# Patient Record
Sex: Female | Born: 1960 | Race: White | Hispanic: No | Smoking: Current every day smoker
Health system: Southern US, Community
[De-identification: ages and names within clinical notes are randomized; demographics above are authoritative.]

## PROBLEM LIST (undated history)

## (undated) DIAGNOSIS — K219 Gastro-esophageal reflux disease without esophagitis: Secondary | ICD-10-CM

## (undated) DIAGNOSIS — T7840XA Allergy, unspecified, initial encounter: Secondary | ICD-10-CM

## (undated) DIAGNOSIS — F419 Anxiety disorder, unspecified: Secondary | ICD-10-CM

## (undated) DIAGNOSIS — A498 Other bacterial infections of unspecified site: Secondary | ICD-10-CM

## (undated) DIAGNOSIS — G629 Polyneuropathy, unspecified: Secondary | ICD-10-CM

## (undated) DIAGNOSIS — B1920 Unspecified viral hepatitis C without hepatic coma: Secondary | ICD-10-CM

## (undated) DIAGNOSIS — F1910 Other psychoactive substance abuse, uncomplicated: Secondary | ICD-10-CM

## (undated) DIAGNOSIS — B009 Herpesviral infection, unspecified: Secondary | ICD-10-CM

## (undated) DIAGNOSIS — J439 Emphysema, unspecified: Secondary | ICD-10-CM

## (undated) DIAGNOSIS — Z9889 Other specified postprocedural states: Secondary | ICD-10-CM

## (undated) DIAGNOSIS — J449 Chronic obstructive pulmonary disease, unspecified: Secondary | ICD-10-CM

## (undated) DIAGNOSIS — L509 Urticaria, unspecified: Secondary | ICD-10-CM

## (undated) DIAGNOSIS — F99 Mental disorder, not otherwise specified: Secondary | ICD-10-CM

## (undated) DIAGNOSIS — R0902 Hypoxemia: Secondary | ICD-10-CM

## (undated) DIAGNOSIS — F1020 Alcohol dependence, uncomplicated: Secondary | ICD-10-CM

## (undated) DIAGNOSIS — T4145XA Adverse effect of unspecified anesthetic, initial encounter: Secondary | ICD-10-CM

## (undated) DIAGNOSIS — M1990 Unspecified osteoarthritis, unspecified site: Secondary | ICD-10-CM

## (undated) DIAGNOSIS — M810 Age-related osteoporosis without current pathological fracture: Secondary | ICD-10-CM

## (undated) DIAGNOSIS — F329 Major depressive disorder, single episode, unspecified: Secondary | ICD-10-CM

## (undated) DIAGNOSIS — G4700 Insomnia, unspecified: Secondary | ICD-10-CM

## (undated) DIAGNOSIS — F32A Depression, unspecified: Secondary | ICD-10-CM

## (undated) DIAGNOSIS — G4730 Sleep apnea, unspecified: Secondary | ICD-10-CM

## (undated) DIAGNOSIS — T8859XA Other complications of anesthesia, initial encounter: Secondary | ICD-10-CM

## (undated) DIAGNOSIS — T783XXA Angioneurotic edema, initial encounter: Secondary | ICD-10-CM

## (undated) DIAGNOSIS — M797 Fibromyalgia: Secondary | ICD-10-CM

## (undated) DIAGNOSIS — I10 Essential (primary) hypertension: Secondary | ICD-10-CM

## (undated) DIAGNOSIS — E785 Hyperlipidemia, unspecified: Secondary | ICD-10-CM

## (undated) DIAGNOSIS — R112 Nausea with vomiting, unspecified: Secondary | ICD-10-CM

## (undated) HISTORY — PX: COLONOSCOPY: SHX174

## (undated) HISTORY — DX: Essential (primary) hypertension: I10

## (undated) HISTORY — PX: TEMPOROMANDIBULAR JOINT SURGERY: SHX35

## (undated) HISTORY — DX: Angioneurotic edema, initial encounter: T78.3XXA

## (undated) HISTORY — PX: AUGMENTATION MAMMAPLASTY: SUR837

## (undated) HISTORY — PX: EXPLORATORY LAPAROTOMY: SUR591

## (undated) HISTORY — DX: Herpesviral infection, unspecified: B00.9

## (undated) HISTORY — DX: Hypoxemia: R09.02

## (undated) HISTORY — DX: Emphysema, unspecified: J43.9

## (undated) HISTORY — PX: TUBAL LIGATION: SHX77

## (undated) HISTORY — DX: Polyneuropathy, unspecified: G62.9

## (undated) HISTORY — DX: Urticaria, unspecified: L50.9

## (undated) HISTORY — DX: Age-related osteoporosis without current pathological fracture: M81.0

## (undated) HISTORY — PX: UPPER GI ENDOSCOPY: SHX6162

## (undated) HISTORY — DX: Alcohol dependence, uncomplicated: F10.20

## (undated) HISTORY — PX: BREAST REDUCTION SURGERY: SHX8

## (undated) HISTORY — DX: Other psychoactive substance abuse, uncomplicated: F19.10

## (undated) HISTORY — DX: Insomnia, unspecified: G47.00

## (undated) HISTORY — PX: BREAST ENHANCEMENT SURGERY: SHX7

## (undated) HISTORY — DX: Allergy, unspecified, initial encounter: T78.40XA

## (undated) HISTORY — DX: Unspecified osteoarthritis, unspecified site: M19.90

## (undated) HISTORY — DX: Sleep apnea, unspecified: G47.30

---

## 2000-01-23 ENCOUNTER — Emergency Department (HOSPITAL_COMMUNITY): Admission: EM | Admit: 2000-01-23 | Discharge: 2000-01-23 | Payer: Self-pay | Admitting: Emergency Medicine

## 2000-11-03 ENCOUNTER — Other Ambulatory Visit: Admission: RE | Admit: 2000-11-03 | Discharge: 2000-11-03 | Payer: Self-pay | Admitting: Obstetrics and Gynecology

## 2001-08-19 ENCOUNTER — Encounter: Payer: Self-pay | Admitting: Gastroenterology

## 2001-08-19 ENCOUNTER — Ambulatory Visit (HOSPITAL_COMMUNITY): Admission: RE | Admit: 2001-08-19 | Discharge: 2001-08-19 | Payer: Self-pay | Admitting: Gastroenterology

## 2001-08-26 ENCOUNTER — Encounter: Payer: Self-pay | Admitting: Gastroenterology

## 2001-08-26 ENCOUNTER — Ambulatory Visit (HOSPITAL_COMMUNITY): Admission: RE | Admit: 2001-08-26 | Discharge: 2001-08-26 | Payer: Self-pay | Admitting: Gastroenterology

## 2001-09-07 ENCOUNTER — Ambulatory Visit (HOSPITAL_COMMUNITY): Admission: RE | Admit: 2001-09-07 | Discharge: 2001-09-07 | Payer: Self-pay | Admitting: Gastroenterology

## 2001-11-05 ENCOUNTER — Other Ambulatory Visit: Admission: RE | Admit: 2001-11-05 | Discharge: 2001-11-05 | Payer: Self-pay | Admitting: Obstetrics and Gynecology

## 2002-01-12 ENCOUNTER — Ambulatory Visit (HOSPITAL_COMMUNITY): Admission: RE | Admit: 2002-01-12 | Discharge: 2002-01-12 | Payer: Self-pay | Admitting: Gastroenterology

## 2002-01-12 ENCOUNTER — Encounter (INDEPENDENT_AMBULATORY_CARE_PROVIDER_SITE_OTHER): Payer: Self-pay | Admitting: Specialist

## 2002-01-19 ENCOUNTER — Encounter: Payer: Self-pay | Admitting: Internal Medicine

## 2002-01-25 ENCOUNTER — Inpatient Hospital Stay (HOSPITAL_COMMUNITY): Admission: RE | Admit: 2002-01-25 | Discharge: 2002-01-26 | Payer: Self-pay | Admitting: *Deleted

## 2002-01-25 ENCOUNTER — Encounter: Payer: Self-pay | Admitting: Internal Medicine

## 2002-12-06 ENCOUNTER — Other Ambulatory Visit: Admission: RE | Admit: 2002-12-06 | Discharge: 2002-12-06 | Payer: Self-pay | Admitting: Obstetrics and Gynecology

## 2003-11-15 ENCOUNTER — Inpatient Hospital Stay (HOSPITAL_COMMUNITY): Admission: EM | Admit: 2003-11-15 | Discharge: 2003-11-22 | Payer: Self-pay | Admitting: Psychiatry

## 2003-11-18 ENCOUNTER — Encounter (HOSPITAL_COMMUNITY): Payer: Self-pay | Admitting: Psychiatry

## 2003-12-29 ENCOUNTER — Other Ambulatory Visit: Admission: RE | Admit: 2003-12-29 | Discharge: 2003-12-29 | Payer: Self-pay | Admitting: Obstetrics and Gynecology

## 2004-08-24 ENCOUNTER — Ambulatory Visit: Payer: Self-pay | Admitting: Family Medicine

## 2004-09-07 ENCOUNTER — Ambulatory Visit: Payer: Self-pay | Admitting: Family Medicine

## 2004-10-12 ENCOUNTER — Ambulatory Visit: Payer: Self-pay | Admitting: Family Medicine

## 2004-12-27 ENCOUNTER — Other Ambulatory Visit: Admission: RE | Admit: 2004-12-27 | Discharge: 2004-12-27 | Payer: Self-pay | Admitting: Obstetrics and Gynecology

## 2005-01-16 ENCOUNTER — Ambulatory Visit: Payer: Self-pay | Admitting: Family Medicine

## 2005-01-24 ENCOUNTER — Encounter (HOSPITAL_COMMUNITY): Admission: RE | Admit: 2005-01-24 | Discharge: 2005-04-24 | Payer: Self-pay | Admitting: Endocrinology

## 2005-02-05 ENCOUNTER — Ambulatory Visit: Payer: Self-pay | Admitting: Family Medicine

## 2005-11-18 ENCOUNTER — Ambulatory Visit: Payer: Self-pay | Admitting: Family Medicine

## 2006-01-28 ENCOUNTER — Encounter: Admission: RE | Admit: 2006-01-28 | Discharge: 2006-01-28 | Payer: Self-pay | Admitting: Obstetrics and Gynecology

## 2006-02-06 ENCOUNTER — Ambulatory Visit: Payer: Self-pay | Admitting: Family Medicine

## 2006-02-12 ENCOUNTER — Other Ambulatory Visit: Admission: RE | Admit: 2006-02-12 | Discharge: 2006-02-12 | Payer: Self-pay | Admitting: Obstetrics and Gynecology

## 2006-05-15 ENCOUNTER — Ambulatory Visit: Payer: Self-pay | Admitting: Internal Medicine

## 2006-07-16 ENCOUNTER — Ambulatory Visit: Payer: Self-pay | Admitting: Internal Medicine

## 2006-07-18 ENCOUNTER — Ambulatory Visit: Payer: Self-pay | Admitting: Cardiology

## 2006-07-22 ENCOUNTER — Ambulatory Visit: Payer: Self-pay | Admitting: *Deleted

## 2007-06-30 DIAGNOSIS — K219 Gastro-esophageal reflux disease without esophagitis: Secondary | ICD-10-CM | POA: Insufficient documentation

## 2007-06-30 DIAGNOSIS — B182 Chronic viral hepatitis C: Secondary | ICD-10-CM | POA: Insufficient documentation

## 2007-06-30 DIAGNOSIS — B1710 Acute hepatitis C without hepatic coma: Secondary | ICD-10-CM | POA: Insufficient documentation

## 2007-06-30 DIAGNOSIS — J309 Allergic rhinitis, unspecified: Secondary | ICD-10-CM | POA: Insufficient documentation

## 2007-06-30 DIAGNOSIS — J45909 Unspecified asthma, uncomplicated: Secondary | ICD-10-CM | POA: Insufficient documentation

## 2007-11-12 ENCOUNTER — Emergency Department (HOSPITAL_COMMUNITY): Admission: EM | Admit: 2007-11-12 | Discharge: 2007-11-12 | Payer: Self-pay | Admitting: Emergency Medicine

## 2007-12-04 ENCOUNTER — Encounter: Admission: RE | Admit: 2007-12-04 | Discharge: 2007-12-04 | Payer: Self-pay | Admitting: Internal Medicine

## 2009-04-28 ENCOUNTER — Encounter: Admission: RE | Admit: 2009-04-28 | Discharge: 2009-04-28 | Payer: Self-pay | Admitting: Obstetrics and Gynecology

## 2009-10-18 ENCOUNTER — Encounter: Admission: RE | Admit: 2009-10-18 | Discharge: 2009-10-18 | Payer: Self-pay | Admitting: Gastroenterology

## 2010-09-28 ENCOUNTER — Encounter
Admission: RE | Admit: 2010-09-28 | Discharge: 2010-09-28 | Payer: Self-pay | Source: Home / Self Care | Attending: Gastroenterology | Admitting: Gastroenterology

## 2010-11-11 ENCOUNTER — Encounter: Payer: Self-pay | Admitting: Internal Medicine

## 2010-12-03 ENCOUNTER — Other Ambulatory Visit: Payer: Self-pay | Admitting: Internal Medicine

## 2010-12-03 ENCOUNTER — Ambulatory Visit
Admission: RE | Admit: 2010-12-03 | Discharge: 2010-12-03 | Disposition: A | Discharge status: Home / Self Care | Payer: BC Managed Care – PPO | Source: Ambulatory Visit | Attending: Internal Medicine | Admitting: Internal Medicine

## 2010-12-03 DIAGNOSIS — R05 Cough: Secondary | ICD-10-CM

## 2010-12-03 DIAGNOSIS — F17200 Nicotine dependence, unspecified, uncomplicated: Secondary | ICD-10-CM

## 2010-12-03 DIAGNOSIS — R059 Cough, unspecified: Secondary | ICD-10-CM

## 2011-03-08 NOTE — Assessment & Plan Note (Signed)
[facility]                               PULMONARY OFFICE NOTE   CALINA, PATRIE                      MRN:          [id number]  DATE:05/15/2006                            DOB:          01-Jul-1961    This is a pulmonary/new patient evaluation.   CHIEF COMPLAINT:  Cough.   HISTORY:  50 year old white female who states she has been coughing since  childhood, actively smoking a pack per day since teenage years, but having  mostly intermittent cough until August of 2006, at which time she has  developed more of a chronic cough, consisting of a congested rattling  sensation that is worse in the winter time, worse in the mornings, and  occurs especially immediately when she lies down at night.  She has minimal  mucoid sputum production, and generalized chest discomfort during coughing  paroxysms.  She has only transient improvement on antibiotics and  prednisone.  She comes in discouraged that she cannot seem to get ahead of  the cough.   PAST MEDICAL HISTORY:  Significant for chronic sinus problems, headaches  and previous jaw surgery, as well as breast reduction surgery.   ALLERGIES:  None known.   MEDICATIONS:  1.  Deplin 1 daily.  2.  Zoloft 100 mg 1/2 daily.  3.  Nadolol 20 mg 1 q.a.m. long-term.  4.  Zyrtec 10 mg 1 daily.  5.  Lamictal 100 mg 1-1/2 daily.   SOCIAL HISTORY:  She continues to smoke a pack per day.  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   FAMILY HISTORY:  Reviewed in detail, significant for allergies and asthma in  her mother, who also was diagnosed with emphysema, is a smoker.   REVIEW OF SYSTEMS:  Taken in detail also on the worksheet.  Significant for  the problems as outlined above.   PHYSICAL EXAMINATION:  GENERAL:  This is a pleasant, middle-aged white  female in no acute distress.  She has stable vital signs.  She does have  somewhat of a gruff quality voice.  HEENT:   Reveals moderate turbinate edema with nonspecific features.  Oropharynx is clear.  No evidence of excessive postnasal drainage or  cobblestoning.  Dentition is intact.  NECK:  Supple without cervical adenopathy or tenderness.  Trachea is midline  without thyromegaly.  CHEST:  Lung fields reveal panexpiratory rhonchi bilaterally, with end  expiratory wheeze/cough.  HEART:  Reveals regular rhythm without murmur, gallop or rub present.  ABDOMEN:  Soft, benign, with no organomegaly, mass or tenderness.  EXTREMITIES:  Warm without calf tenderness, cyanosis, clubbing or edema.   Chest x-ray reveals mild COPD changes with increased bronchial markings.   IMPRESSION:  Chronic asthmatic bronchitis/chronic obstructive pulmonary  disease, with acquired mucociliary dysfunction suggested by increased  morning sputum production, but also perhaps a component of postnasal drip  syndrome/chronic rhinitis/sinusitis based on the fact that she immediately  coughs on assuming the supine position.   She tells me that she is not consistent about using nadolol, and nadolol is  a nonspecific  beta blocker that may aggravate asthmatic bronchitis.  I  therefore recommended the following in writing:  1.  Stop nadolol.  2.  Start Mucinex DM 2 b.i.d., if still coughing add tramadol 50 mg 1 q.4h.  3.  Start Nexium 40 mg 1 q.a.m. 30 minutes before breakfast, because such      severe chronic coughing can lead to reflux.  4.  Finish a course of doxycycline that she started (even though I note that      she does not have significant purulent sputum at this point).  5.  Start Symbicort 80/4.5 2 puffs b.i.d. perfectly regular, with extra time      teaching her optimal technique today.  6.  Followup planned for 6 weeks with PFTs.   If she does not improve on this regimen, I have recommended a sinus CT scan  prior to next visit.                                   Charlaine Dalton. Sherene Sires, MD, [facility]   MBW/MedQ  DD:  05/16/2006   DT:  05/17/2006  Job #:  [id number]

## 2011-03-08 NOTE — Discharge Summary (Signed)
NAME:  Sarah Reeves, Sarah Reeves                         ACCOUNT NO.:  [id number]   MEDICAL RECORD NO.:  [id number]                   PATIENT TYPE:  IPS   LOCATION:  0304                                 FACILITY:  [facility]   PHYSICIAN:  Jeanice Lim, M.D.              DATE OF BIRTH:  05-Jun-1961   DATE OF ADMISSION:  11/15/2003  DATE OF DISCHARGE:  11/22/2003                                 DISCHARGE SUMMARY   IDENTIFYING DATA:  This is a 50 year old married Caucasian female who  presented with a history of intentional overdose on Xanax, taking 25  tablets.  Had been drinking, angry at husband, intention was to end it.  The patient had stumbled around, hit head, fell on knees.  The patient was  feeling that she was 41 and did not know what she had accomplished.  Spends  a lot of time sleeping.  Had been working on alcohol dependence and a  history of polysubstance abuse.  Had been in [facility] in April of  2004 for alcohol and had seen Dr. Betti Cruz.  Had been in [facility] in the 90s and  [employer] [employer] for alcohol as well.  History of substance abuse since age 12.  Had  been quite out of control until married to current husband and then had been  trying to get healthy.  The patient's mother has a history of alcoholism as  well as aunt.  The patient admitted to a history of blackouts.  Kelle Darting  and [facility] are patient's primary care physician and gastroenterologist.  The patient had history of hepatitis C.   MEDICATIONS:  The patient was on medications of Xanax XR 2 mg in the morning  and Strattera that was recently started, Zoloft 50 mg, increased to 100 mg  the day of admission.  She had been on for one month.  Nexium 40 mg in the  morning.   ALLERGIES:  No known drug allergies.   PHYSICAL EXAMINATION:  Essentially within normal limits.  Neurologically  nonfocal.   LABORATORY DATA:  Routine admission labs within normal limits.   MENTAL STATUS EXAM:  Alert, middle-aged female, cooperative.   Fair eye  contact.  Speech was evasive.  Mood depressed.  The patient was initially  somewhat guarded but then became more open.  Affect was somewhat labile  initially, quite depressed and feeling desperate, hopeless, helpless,  worthless and anxious.  Cognitively intact.  Memory was good.  Judgment and  insight were fair to poor with a history of poor impulse control.   ADMISSION DIAGNOSES:   AXIS I:  1. Major depressive disorder, recurrent, severe.  2. Possible history of post-traumatic stress disorder as well.  3. Polysubstance abuse.  4. Alcohol dependence.   AXIS II:  Deferred.   AXIS III:  Hepatitis C.   AXIS IV:  Moderate (problems with psychosocial issues, some lifestyle issues  with husband who travels and is around  alcohol a great deal).   AXIS V:  30/65-70.   HOSPITAL COURSE:  The patient was admitted and ordered routine p.r.n.  medications and underwent further monitoring.  Was encouraged to participate  in individual, group and milieu therapy.  The patient initially was quite  depressed, irritable, labile, complaining of a history of mood swings.  No  response to Zoloft.  Complained of anxiety and depression.  Had slipped  three times since residential treatment at [facility].  Had gone  periods without drinking but would begin drinking and conceal it, often  after spending time around husband and friends that were drinking.  Husband,  apparently, does not have an alcohol dependency issue, however, and he does  a lot of traveling and socializing and settings where alcohol was present.  The patient admitted to a history of impulsivity which disturbs her.  She  reported tolerating medication changes.  Initially, she was still having  some difficulty sleeping, complained of anxiety and significant depression  as well as possible withdrawal symptoms.  The patient tolerated detox  monitoring, reported an increase in anxiety in the middle of the day.  Complained of  difficulty concentrating, no energy as well as mood swings.  Apparently wanting to get better very quickly despite medication education  and describing the time period it takes for the medications to work.  All of  her symptoms were addressed and medications started.  She tolerated the  medications without side effects.   CONDITION ON DISCHARGE:  Markedly improved.  Aware of the critical issue of  staying away from alcohol and getting substance abuse treatment in addition  to stabilization on medication and also aware of the time period it takes to  respond to medications.  The patient showed significant improvement in  judgment and insight by the time of discharge with healthier coping skills  and a good relapse prevention plan as well as was highly motivated to be  compliant with aftercare.  The patient was discharged in improved condition  without psychotic symptoms or dangerous ideation.  Again, euthymic, affect  bright.  Mood was less depressed.  Affect was brighter with no lability.   DISCHARGE MEDICATIONS:  1. Wellbutrin XL 300 mg q.a.m.  2. Lamictal 25 mg q.a.m. x 7 days and then 2 q.a.m.  3. Seroquel 100 mg, 1-1-1/2 q.h.s. for sleep, taking 1-1/2 hours before     sleep.  4. Nystatin x 10 days.   FOLLOW UP:  The patient was to follow up with Dr. Betti Cruz at [facility]  but also recommended to follow up with Dr. Kathrynn Running instead.  The patient was  still scheduled with Dr. Betti Cruz to terminate and Jerral Bonito on November 24, 2003 at 11 a.m. and with Dr. Betti Cruz on December 06, 2003 at 3 p.m.  However,  the patient insisted that she would change psychiatrists despite  encouragement to continue follow-up.   DISCHARGE DIAGNOSES:   AXIS I:  1. Major depressive disorder, recurrent, severe.  2. Possible history of post-traumatic stress disorder as well.  3. Polysubstance abuse.  4. Alcohol dependence.   AXIS II:  Deferred.  AXIS III:  Hepatitis C.   AXIS IV:  Moderate (problems  with psychosocial issues, some lifestyle issues  with husband who travels and is around alcohol a great deal).   AXIS V:  Global Assessment of Functioning on discharge 55.  Jeanice Lim, M.D.    Lovie Macadamia  D:  12/17/2003  T:  12/18/2003  Job:  [id number]

## 2011-03-08 NOTE — Op Note (Signed)
[facility]  Patient:    Sarah Reeves, Sarah Reeves Visit Number: [id number] MRN: [id number]          Service Type: SUR Location: 1S X009 01 Attending Physician:  Effie Berkshire Dictated by:   Shela Commons. Kristen Cardinal, D.D.S. Proc. Date: 01/25/02 Admit Date:  01/25/2002                             Operative Report  PREOPERATIVE DIAGNOSIS:  Mandibular sagittal deficiency with deep bite.  POSTOPERATIVE DIAGNOSIS:  Mandibular sagittal deficiency with deep bite.  SURGEONS: 1. Saddie Benders, D.D.S. 2. Dionne Ano. Gwyneth Sprout., D.D.S.  SURGICAL PROCEDURE:  Mandibular sagittal split osteotomy with rigid osseous fixation.  DETAILS OF SURGICAL PROCEDURE:  On January 25, 2002, this patient presented herself as an outpatient to [facility] to be admitted following surgery.  After obtaining the proper level of general anesthesia with the use of nasotracheal intubation, a nasogastric tube was also placed.  The oral cavity was then thoroughly suctioned, and a two inch wet vaginal gauze packing placed.  The patient then received an intraoral and extraoral Betadine prep. Sterile drapes were then used to isolate the surgical field, and the oral cavity was thoroughly irrigated and suctioned.  Attention was now directed to the mandibular right and left ramus regions where a total of 3.6 cc of 0.5% Marcaine with 1:200,000 epinephrine were administered.  Attention was now directed to the mandibular right ramus region where the Bovie was used to made a mucoperiosteal incision overlying the ascending border of the ramus down to a point lateral to the mandibular right second molar.  The periosteal elevator was used to reflect this mucoperiosteal incision, and the anterior border stripper was then used to strip the mucoperiosteum overlying the ascending border of the ramus up to a point just inferior to the tip of the coronoid process.  The self-retaining retractor was  then placed on the bone, and the periosteal elevator was used to reflect the mucoperiosteum over the lingual aspect of the mandible, and the lingula was positively identified.  While using a Henahan retractor to protect the lingual tissues, the Stryker drill with a Lindemann bur was then used to make the medial horizontal osteotomy cut through the lingual cortex of bone and into the medullary bone from the ascending border of the ramus approximately two-thirds of the way to the posterior border of the ramus.  The same Stryker drill was then used with a #701 bur to make small bur holes to connect these from the most proximal portion of the medial horizontal osteotomy cut down to a point lateral to the mandibular right second molar.  At this point, the channel retractor was placed in the mandibular notch, and the Stryker drill with a #703 bur was used to make the lateral vertical osteotomy cut through both cortices of the very inferior border and then only through the lateral cortex of bone as the cut was extended superiorly to join a connecting cut.  First, utilizing increasing sizes of osteotomes and then the [facility] splitting instruments, the mandible was split in a sagittal fashion, and it was noted that the contents of the mandibular canal were totally intact.  The pterygomasseteric sling stripper was then used to strip the muscular attachment in the osteotomy site on the distal aspect of the mandible, and a half-pack was placed.  Attention was now directed to the left side, where the same sagittal  split was accomplished, and this time we did have to remove some medullary bone overlying the mandibular canal in order to completely free it up.  Once it was freed up, it was found to be totally intact.  A previously-constructed surgical splint was then placed on the patients maxillary teeth, and the mandible was advanced into the splint, and intermaxillary fixation was obtained with six #26  gauge wire loops.  Inspection of the right side of the mandible revealed that the two cortices would reapproximate with ease and, at this point, a seating notch was placed on the most anterior aspect of the proximal segment and also a seating hole in the ramus region.  Attention was directed to the left side of the mandible, and it was found that some _________ of the bone had to be accomplished on the distal aspect in order for the two cortical plates to fit passively.  Once this was accomplished, the same seating notch and seating hole were placed.  Continuing work on the left side of the mandible, the modified Allis clamp was then placed loosely along the two cortical plates and then utilizing digital pressure in a posterior and superior manner, the condyle was seated into the fossa, and the clamp was tightened.  Rigid osteal screw fixation was then obtained utilizing a trocar with a drill guide and a 1.5 mm drill bit and then a 2 mm tap.  Two screws, each 2 mm in diameter, were placed on the left side of the mandible, and the clamp was removed, and this segment was found to be secure.  The same procedure was completed on the right side in order to obtain rigid osseous fixation.  The intermaxillary fixation wires were all cut, and it was found that the mandible would autorotate into the splint satisfactorily.  The splint was removed and the occlusion evaluated and found to be that obtained on the surgical models.  Both osteotomy sites were inspected, and the bone was found to be secure.  At this point, both osteotomy incisions were thoroughly irrigated and suctioned, and tissue was reapproximated with #3-0 Vicryl suture in a continuous manner.  The entire oral cavity was thoroughly irrigated, and the splint was then wired to the maxillary teeth utilizing four #28 gauge stainless steel wires.  The oral cavity was thoroughly irrigated and suctioned, and the two inch wet vaginal gauze  packing was removed.  The patient was then placed into intermaxillary fixation utilizing two #26 gauge stainless steel wire loops.  This patient was  the extubated in the operating room and transferred to the recovery room in stable condition.  TIME OF THE PROCEDURE:  Two and half hours.  ESTIMATED BLOOD LOSS:  200 cc.  ANESTHESIA:  General.  COMPLICATIONS:  Without.  CONDITION OF PATIENT:  When last examined was deemed satisfactory. Dictated by:   Shela Commons. Kristen Cardinal, D.D.S. Attending Physician:  Effie Berkshire DD:  01/25/02 TD:  01/25/02 Job: [id number]

## 2011-03-08 NOTE — Assessment & Plan Note (Signed)
[facility]                               PULMONARY OFFICE NOTE   Sarah Reeves, Sarah Reeves                      MRN:          [id number]  DATE:07/16/2006                            DOB:          [birth year]    HISTORY:  50 year old white female with cough and subjective wheeze and when  seen on July 26, actively smoking, returns still actively smoking, and did  not actually follow any of the instructions I gave her on her previous  visit, except she stopped Nadolol which I thought may be contributing to  asthma.  Her main complaint is no longer a cough but sinus congestion and  ear ache.  She denies any fevers, chills, sweats, orthopnea, PND, or leg  swelling.  She did see Dr. Kinnie Scales for follow up recently and he recommended  she stay on the Nexium and thought that it might be a cause of some of her  sinus complaints and asked her to take b.i.d. dosing until better and then  one daily.  She says she is just starting that now.   PHYSICAL EXAMINATION:  GENERAL:  She is an ambulatory, anxious white female, who has a great deal  of difficulty answering any questions asked in a straight forward fashion.  VITAL SIGNS:  Stable.  HEENT:  Oropharynx clear.  No excessive post nasal drainage or  cobblestoning.  Neck is supple without cervical adenopathy or tenderness.  Trachea is  midline.  No thyromegaly.  LUNGS:  Fields clear bilaterally to auscultation and percussion.  HEART:  Regular rate and rhythm without murmur, gallop or rub.  ABDOMEN:  Soft, benign.  EXTREMITIES:  Warm without calf tenderness, cyanosis, clubbing, or edema.   PFTs were normal today.  Chest x-ray from July 26 is normal.   IMPRESSION:  I had an extended discussion with this patient lasting 15 to 25  minutes with the following options:  1. Clearly, the issue with this patient is active smoking, not choice of      medicines or doctors.  I emphasized this point to her repeatedly today.  2.  I do not believe she needs any treatment for lower respiratory disease      except to stop smoking.  3. She has clinical evidence of nasal inflammation which may be partly      related to reflux and/or directly related to smoking.  I would      recommend a CT scan of the sinuses to be complete and also that she      follow Dr. Jennye Boroughs previous instruction to maintain Nexium b.i.d.  If      she has evidence of structural sinus disease, an ENT evaluation should      be considered.   She had requested a prescription for Chantix which she says she lost from  Dr. Tawanna Cooler.  I gave her a starter pack only and asked her to see Dr. Tawanna Cooler  toward the end of her starter pack.   Regular pulmonary follow up will not be needed at this point.  ______________________________  Charlaine Dalton Sherene Sires, MD, [facility]      MBW/MedQ  DD:  07/16/2006  DT:  07/18/2006  Job #:  [id number]   cc:   Tinnie Gens A. Tawanna Cooler, MD

## 2011-05-29 ENCOUNTER — Ambulatory Visit: Payer: BC Managed Care – PPO | Admitting: Cardiology

## 2011-12-02 ENCOUNTER — Other Ambulatory Visit: Payer: Self-pay | Admitting: Internal Medicine

## 2011-12-02 ENCOUNTER — Ambulatory Visit
Admission: RE | Admit: 2011-12-02 | Discharge: 2011-12-02 | Disposition: A | Discharge status: Home / Self Care | Payer: 59 | Source: Ambulatory Visit | Attending: Internal Medicine | Admitting: Internal Medicine

## 2011-12-02 DIAGNOSIS — R05 Cough: Secondary | ICD-10-CM

## 2011-12-02 DIAGNOSIS — R0789 Other chest pain: Secondary | ICD-10-CM

## 2011-12-02 DIAGNOSIS — R059 Cough, unspecified: Secondary | ICD-10-CM

## 2012-01-20 ENCOUNTER — Encounter (HOSPITAL_BASED_OUTPATIENT_CLINIC_OR_DEPARTMENT_OTHER): Payer: Self-pay

## 2012-01-20 ENCOUNTER — Emergency Department (HOSPITAL_BASED_OUTPATIENT_CLINIC_OR_DEPARTMENT_OTHER)
Admission: EM | Admit: 2012-01-20 | Discharge: 2012-01-20 | Disposition: A | Discharge status: Home / Self Care | Payer: 59 | Attending: Emergency Medicine | Admitting: Emergency Medicine

## 2012-01-20 DIAGNOSIS — B1920 Unspecified viral hepatitis C without hepatic coma: Secondary | ICD-10-CM | POA: Insufficient documentation

## 2012-01-20 DIAGNOSIS — J45909 Unspecified asthma, uncomplicated: Secondary | ICD-10-CM | POA: Insufficient documentation

## 2012-01-20 DIAGNOSIS — J329 Chronic sinusitis, unspecified: Secondary | ICD-10-CM

## 2012-01-20 DIAGNOSIS — F17200 Nicotine dependence, unspecified, uncomplicated: Secondary | ICD-10-CM | POA: Insufficient documentation

## 2012-01-20 DIAGNOSIS — E785 Hyperlipidemia, unspecified: Secondary | ICD-10-CM | POA: Insufficient documentation

## 2012-01-20 HISTORY — DX: Hyperlipidemia, unspecified: E78.5

## 2012-01-20 HISTORY — DX: Unspecified viral hepatitis C without hepatic coma: B19.20

## 2012-01-20 MED ORDER — AMOXICILLIN 500 MG PO CAPS: 500.0000 mg | ORAL_CAPSULE | Freq: Three times a day (TID) | ORAL | Status: AC

## 2012-01-20 MED ORDER — LEVALBUTEROL HCL 0.63 MG/3ML IN NEBU: 1.0000 | INHALATION_SOLUTION | Freq: Once | RESPIRATORY_TRACT | Status: DC

## 2012-01-20 MED ORDER — PREDNISONE 10 MG PO TABS: 20.0000 mg | ORAL_TABLET | Freq: Every day | ORAL | Status: DC

## 2012-01-20 NOTE — ED Provider Notes (Signed)
History     CSN: [id number]  Arrival date & time 01/20/12  1055   First MD Initiated Contact with Patient 01/20/12 1113      Chief Complaint  Patient presents with  . Sinusitis    (Consider location/radiation/quality/duration/timing/severity/associated sxs/prior treatment) HPI  Pt presents to the ER with complaints of sinus pain and pressure with head congestion that started this past Saturday. She denies taking her temperature but admits to feeling hot. She also complains of wheezing. She has a nebulizer machine at home but has not been using her albuterol because it makes her jittery. Pt does admit to taking some of her husbands Amoxicillin yesterday but he does not have a lot of pills left so came in to get her own. She denies chills, N/V/D, ear pain, sore throat and weakness. Pt in NAD  Past Medical History  Diagnosis Date  . Asthma   . Hepatitis C   . Hyperlipemia     Past Surgical History  Procedure Date  . Breast reduction surgery   . Breast enhancement surgery   . Temporomandibular joint surgery   . Tubal ligation   . Exploratory laparotomy     No family history on file.  History  Substance Use Topics  . Smoking status: Current Everyday Smoker    Types: Cigarettes  . Smokeless tobacco: Never Used  . Alcohol Use: Yes    OB History    Grav Para Term Preterm Abortions TAB SAB Ect Mult Living                  Review of Systems  All other systems reviewed and are negative.    Allergies  Review of patient's allergies indicates no active allergies.  Home Medications   Current Outpatient Rx  Name Route Sig Dispense Refill  . ALBUTEROL SULFATE (2.5 MG/3ML) 0.083% IN NEBU Nebulization Take 2.5 mg by nebulization every 6 (six) hours as needed.    . AMOXICILLIN 500 MG PO CAPS Oral Take 500 mg by mouth 2 (two) times daily.    Marland Kitchen CETIRIZINE HCL 10 MG PO TABS Oral Take 10 mg by mouth daily.    Marland Kitchen DM-GUAIFENESIN ER 30-600 MG PO TB12 Oral Take 1 tablet by mouth  every 12 (twelve) hours.    Marland Kitchen LORAZEPAM 1 MG PO TABS Oral Take 1 mg by mouth 2 (two) times daily as needed.    . AMOXICILLIN 500 MG PO CAPS Oral Take 1 capsule (500 mg total) by mouth 3 (three) times daily. 21 capsule 0  . LEVALBUTEROL HCL 0.63 MG/3ML IN NEBU Nebulization Take 3 mLs (0.63 mg total) by nebulization once. 3 mL 12  . PREDNISONE 10 MG PO TABS Oral Take 2 tablets (20 mg total) by mouth daily. 14 tablet 0    BP 130/83  Pulse 92  Temp(Src) 98.1 F (36.7 C) (Oral)  Resp 16  Ht 5\' 4"  (1.626 m)  Wt 130 lb (58.968 kg)  BMI 22.31 kg/m2  SpO2 100%  Physical Exam  Nursing note and vitals reviewed. Constitutional: She appears well-developed and well-nourished. No distress.  HENT:  Head: Normocephalic and atraumatic.  Right Ear: External ear normal.  Left Ear: External ear normal.  Nose: Rhinorrhea and sinus tenderness present. No epistaxis.  No foreign bodies. Right sinus exhibits maxillary sinus tenderness and frontal sinus tenderness. Left sinus exhibits maxillary sinus tenderness and frontal sinus tenderness.  Eyes: Pupils are equal, round, and reactive to light.  Neck: Normal range of motion. Neck supple.  Cardiovascular: Normal rate and regular rhythm.   Pulmonary/Chest: Effort normal. No respiratory distress. She has wheezes (no wheezing heard during normal breaths, when pt takes big breaths wheezing heard). She has no rales. She exhibits no tenderness.  Abdominal: Soft. There is no tenderness.  Neurological: She is alert.  Skin: Skin is warm and dry.    ED Course  Procedures (including critical care time)  Labs Reviewed - No data to display No results found.   1. Sinusitis   2. Asthma       MDM  Rx: Xopenex neb solution prednisone Amoxicillin  Pt advised of symptoms that warrant return. Especially fevers, chills, confusion. Pt has appointment with PCP in two weeks.  Pt has been advised of the symptoms that warrant their return to the ED. Patient has  voiced understanding and has agreed to follow-up with the PCP or specialist.         Dorthula Matas, PA 01/20/12 1130

## 2012-01-20 NOTE — Discharge Instructions (Signed)
Sinusitis  Sinuses are air pockets within the bones of your face. The growth of bacteria within a sinus leads to infection. The infection prevents the sinuses from draining. This infection is called sinusitis.  SYMPTOMS   There will be different areas of pain depending on which sinuses have become infected.  · The maxillary sinuses often produce pain beneath the eyes.  · Frontal sinusitis may cause pain in the middle of the forehead and above the eyes.  Other problems (symptoms) include:  · Toothaches.  · Colored, pus-like (purulent) drainage from the nose.  · Swelling, warmth, and tenderness over the sinus areas may be signs of infection.  TREATMENT   Sinusitis is most often determined by an exam.X-rays may be taken. If x-rays have been taken, make sure you obtain your results or find out how you are to obtain them. Your caregiver may give you medications (antibiotics). These are medications that will help kill the bacteria causing the infection. You may also be given a medication (decongestant) that helps to reduce sinus swelling.   HOME CARE INSTRUCTIONS   · Only take over-the-counter or prescription medicines for pain, discomfort, or fever as directed by your caregiver.  · Drink extra fluids. Fluids help thin the mucus so your sinuses can drain more easily.  · Applying either moist heat or ice packs to the sinus areas may help relieve discomfort.  · Use saline nasal sprays to help moisten your sinuses. The sprays can be found at your local drugstore.  SEEK IMMEDIATE MEDICAL CARE IF:  · You have a fever.  · You have increasing pain, severe headaches, or toothache.  · You have nausea, vomiting, or drowsiness.  · You develop unusual swelling around the face or trouble seeing.  MAKE SURE YOU:   · Understand these instructions.  · Will watch your condition.  · Will get help right away if you are not doing well or get worse.  Document Released: 10/07/2005 Document Revised: 09/26/2011 Document Reviewed:  05/06/2007  ExitCare® Patient Information ©2012 [employer].  Asthma, Adult  Asthma is caused by narrowing of the air passages in the lungs. It may be triggered by pollen, dust, animal dander, molds, some foods, respiratory infections, exposure to smoke, exercise, emotional stress or other allergens (things that cause allergic reactions or allergies). Repeat attacks are common.  HOME CARE INSTRUCTIONS   · Use prescription medications as ordered by your caregiver.  · Avoid pollen, dust, animal dander, molds, smoke and other things that cause attacks at home and at work.  · You may have fewer attacks if you decrease dust in your home. Electrostatic air cleaners may help.  · It may help to replace your pillows or mattress with materials less likely to cause allergies.  · Talk to your caregiver about an action plan for managing asthma attacks at home, including, the use of a peak flow meter which measures the severity of your asthma attack. An action plan can help minimize or stop the attack without having to seek medical care.  · If you are not on a fluid restriction, drink 8 to 10 glasses of water each day.  · Always have a plan prepared for seeking medical attention, including, calling your physician, accessing local emergency care, and calling 911 (in the [place] for a severe attack.  · Discuss possible exercise routines with your caregiver.  · If animal dander is the cause of asthma, you may need to get rid of pets.  SEEK MEDICAL CARE IF:   ·   You have wheezing and shortness of breath even if taking medicine to prevent attacks.  · You have muscle aches, chest pain or thickening of sputum.  · Your sputum changes from clear or white to yellow, green, gray, or bloody.  · You have any problems that may be related to the medicine you are taking (such as a rash, itching, swelling or trouble breathing).  SEEK IMMEDIATE MEDICAL CARE IF:   · Your usual medicines do not stop your wheezing or there is increased coughing and/or  shortness of breath.  · You have increased difficulty breathing.  · You have a fever.  MAKE SURE YOU:   · Understand these instructions.  · Will watch your condition.  · Will get help right away if you are not doing well or get worse.  Document Released: 10/07/2005 Document Revised: 09/26/2011 Document Reviewed: 05/25/2008  ExitCare® Patient Information ©2012 [employer].

## 2012-01-20 NOTE — ED Notes (Signed)
Pt states that Saturday she had onset of sinus pain/pressure, head congestion, taking otc meds, and it has not been improving.

## 2012-01-20 NOTE — ED Provider Notes (Signed)
Medical screening examination/treatment/procedure(s) were performed by non-physician practitioner and as supervising physician I was immediately available for consultation/collaboration.    Shaniece Bussa L Ames Hoban, MD 01/20/12 1835 

## 2012-06-19 ENCOUNTER — Ambulatory Visit: Payer: 59 | Attending: Physical Medicine and Rehabilitation | Admitting: Physical Therapy

## 2012-06-19 DIAGNOSIS — M546 Pain in thoracic spine: Secondary | ICD-10-CM | POA: Insufficient documentation

## 2012-06-19 DIAGNOSIS — IMO0001 Reserved for inherently not codable concepts without codable children: Secondary | ICD-10-CM | POA: Insufficient documentation

## 2012-06-19 DIAGNOSIS — M545 Low back pain, unspecified: Secondary | ICD-10-CM | POA: Insufficient documentation

## 2012-06-19 DIAGNOSIS — M25579 Pain in unspecified ankle and joints of unspecified foot: Secondary | ICD-10-CM | POA: Insufficient documentation

## 2012-06-24 ENCOUNTER — Ambulatory Visit: Payer: 59 | Attending: Physical Medicine and Rehabilitation | Admitting: Physical Therapy

## 2012-06-24 DIAGNOSIS — IMO0001 Reserved for inherently not codable concepts without codable children: Secondary | ICD-10-CM | POA: Insufficient documentation

## 2012-06-24 DIAGNOSIS — M545 Low back pain, unspecified: Secondary | ICD-10-CM | POA: Insufficient documentation

## 2012-06-24 DIAGNOSIS — M546 Pain in thoracic spine: Secondary | ICD-10-CM | POA: Insufficient documentation

## 2012-06-24 DIAGNOSIS — M25579 Pain in unspecified ankle and joints of unspecified foot: Secondary | ICD-10-CM | POA: Insufficient documentation

## 2012-06-29 ENCOUNTER — Ambulatory Visit: Payer: 59 | Admitting: Physical Therapy

## 2012-07-01 ENCOUNTER — Ambulatory Visit: Payer: 59 | Admitting: Physical Therapy

## 2012-07-06 ENCOUNTER — Ambulatory Visit: Payer: 59 | Admitting: Physical Therapy

## 2012-07-08 ENCOUNTER — Ambulatory Visit: Payer: 59 | Admitting: Physical Therapy

## 2012-07-13 ENCOUNTER — Ambulatory Visit: Payer: 59 | Admitting: Physical Therapy

## 2012-07-15 ENCOUNTER — Ambulatory Visit: Payer: 59 | Admitting: Physical Therapy

## 2012-08-20 ENCOUNTER — Other Ambulatory Visit: Payer: Self-pay | Admitting: Otolaryngology

## 2012-08-20 DIAGNOSIS — J329 Chronic sinusitis, unspecified: Secondary | ICD-10-CM

## 2012-08-26 ENCOUNTER — Ambulatory Visit
Admission: RE | Admit: 2012-08-26 | Discharge: 2012-08-26 | Disposition: A | Discharge status: Home / Self Care | Payer: 59 | Source: Ambulatory Visit | Attending: Otolaryngology | Admitting: Otolaryngology

## 2012-08-26 DIAGNOSIS — J329 Chronic sinusitis, unspecified: Secondary | ICD-10-CM

## 2012-09-09 ENCOUNTER — Other Ambulatory Visit: Payer: Self-pay | Admitting: Obstetrics and Gynecology

## 2013-04-30 ENCOUNTER — Encounter (HOSPITAL_COMMUNITY): Payer: Self-pay | Admitting: Emergency Medicine

## 2013-04-30 ENCOUNTER — Emergency Department (HOSPITAL_COMMUNITY)
Admission: EM | Admit: 2013-04-30 | Discharge: 2013-04-30 | Disposition: A | Discharge status: Home / Self Care | Payer: 59 | Attending: Emergency Medicine | Admitting: Emergency Medicine

## 2013-04-30 DIAGNOSIS — Z862 Personal history of diseases of the blood and blood-forming organs and certain disorders involving the immune mechanism: Secondary | ICD-10-CM | POA: Insufficient documentation

## 2013-04-30 DIAGNOSIS — F3289 Other specified depressive episodes: Secondary | ICD-10-CM | POA: Insufficient documentation

## 2013-04-30 DIAGNOSIS — Z8639 Personal history of other endocrine, nutritional and metabolic disease: Secondary | ICD-10-CM | POA: Insufficient documentation

## 2013-04-30 DIAGNOSIS — F411 Generalized anxiety disorder: Secondary | ICD-10-CM | POA: Insufficient documentation

## 2013-04-30 DIAGNOSIS — F39 Unspecified mood [affective] disorder: Secondary | ICD-10-CM | POA: Insufficient documentation

## 2013-04-30 DIAGNOSIS — F1994 Other psychoactive substance use, unspecified with psychoactive substance-induced mood disorder: Secondary | ICD-10-CM

## 2013-04-30 DIAGNOSIS — F32A Depression, unspecified: Secondary | ICD-10-CM

## 2013-04-30 DIAGNOSIS — F17200 Nicotine dependence, unspecified, uncomplicated: Secondary | ICD-10-CM | POA: Insufficient documentation

## 2013-04-30 DIAGNOSIS — F1020 Alcohol dependence, uncomplicated: Secondary | ICD-10-CM | POA: Diagnosis present

## 2013-04-30 DIAGNOSIS — Z79899 Other long term (current) drug therapy: Secondary | ICD-10-CM | POA: Insufficient documentation

## 2013-04-30 DIAGNOSIS — G479 Sleep disorder, unspecified: Secondary | ICD-10-CM | POA: Insufficient documentation

## 2013-04-30 DIAGNOSIS — F329 Major depressive disorder, single episode, unspecified: Secondary | ICD-10-CM | POA: Insufficient documentation

## 2013-04-30 DIAGNOSIS — F1010 Alcohol abuse, uncomplicated: Secondary | ICD-10-CM | POA: Insufficient documentation

## 2013-04-30 DIAGNOSIS — J45909 Unspecified asthma, uncomplicated: Secondary | ICD-10-CM | POA: Insufficient documentation

## 2013-04-30 DIAGNOSIS — F419 Anxiety disorder, unspecified: Secondary | ICD-10-CM

## 2013-04-30 DIAGNOSIS — B1920 Unspecified viral hepatitis C without hepatic coma: Secondary | ICD-10-CM | POA: Insufficient documentation

## 2013-04-30 LAB — COMPREHENSIVE METABOLIC PANEL
ALT: 40 U/L — ABNORMAL HIGH (ref 0–35)
AST: 36 U/L (ref 0–37)
Albumin: 4.1 g/dL (ref 3.5–5.2)
Alkaline Phosphatase: 63 U/L (ref 39–117)
BUN: 7 mg/dL (ref 6–23)
CO2: 25 mEq/L (ref 19–32)
Calcium: 9.5 mg/dL (ref 8.4–10.5)
Chloride: 99 mEq/L (ref 96–112)
Creatinine, Ser: 0.64 mg/dL (ref 0.50–1.10)
GFR calc Af Amer: >90 mL/min (ref >90)
GFR calc non Af Amer: >90 mL/min (ref >90)
Glucose, Bld: 97 mg/dL (ref 70–99)
Potassium: 4.4 mEq/L (ref 3.5–5.1)
Sodium: 135 mEq/L (ref 135–145)
Total Bilirubin: 0.1 mg/dL — ABNORMAL LOW (ref 0.3–1.2)
Total Protein: 7.6 g/dL (ref 6.0–8.3)

## 2013-04-30 LAB — RAPID URINE DRUG SCREEN, HOSP PERFORMED
Amphetamines: NOT DETECTED
Barbiturates: NOT DETECTED
Benzodiazepines: NOT DETECTED
Cocaine: NOT DETECTED
Opiates: NOT DETECTED
Tetrahydrocannabinol: NOT DETECTED

## 2013-04-30 LAB — CBC
HCT: 42 % (ref 36.0–46.0)
Hemoglobin: 14.1 g/dL (ref 12.0–15.0)
MCH: 30.1 pg (ref 26.0–34.0)
MCHC: 33.6 g/dL (ref 30.0–36.0)
MCV: 89.6 fL (ref 78.0–100.0)
Platelets: 224 10*3/uL (ref 150–400)
RBC: 4.69 MIL/uL (ref 3.87–5.11)
RDW: 13.6 % (ref 11.5–15.5)
WBC: 7.7 10*3/uL (ref 4.0–10.5)

## 2013-04-30 LAB — SALICYLATE LEVEL: Salicylate Lvl: <2 mg/dL — ABNORMAL LOW (ref 2.8–20.0)

## 2013-04-30 LAB — ETHANOL: Alcohol, Ethyl (B): 167 mg/dL — ABNORMAL HIGH (ref 0–11)

## 2013-04-30 LAB — ACETAMINOPHEN LEVEL: Acetaminophen (Tylenol), Serum: <15 ug/mL (ref 10–30)

## 2013-04-30 MED ORDER — LORAZEPAM 1 MG PO TABS: 1.0000 mg | ORAL_TABLET | Freq: Two times a day (BID) | ORAL | Status: DC | PRN

## 2013-04-30 MED ORDER — LORATADINE 10 MG PO TABS
10.0000 mg | ORAL_TABLET | Freq: Every day | ORAL | Status: DC
  Filled 2013-04-30: qty 1

## 2013-04-30 MED ORDER — VITAMIN D3 25 MCG (1000 UNIT) PO TABS
1000.0000 [IU] | ORAL_TABLET | Freq: Every day | ORAL | Status: DC
  Filled 2013-04-30: qty 1

## 2013-04-30 MED ORDER — ONDANSETRON HCL 4 MG PO TABS: 4.0000 mg | ORAL_TABLET | Freq: Three times a day (TID) | ORAL | Status: DC | PRN

## 2013-04-30 MED ORDER — VALACYCLOVIR HCL 500 MG PO TABS
500.0000 mg | ORAL_TABLET | Freq: Two times a day (BID) | ORAL | Status: DC
  Filled 2013-04-30: qty 1

## 2013-04-30 MED ORDER — TRAMADOL HCL 50 MG PO TABS: 50.0000 mg | ORAL_TABLET | Freq: Every day | ORAL | Status: DC

## 2013-04-30 MED ORDER — NICOTINE 21 MG/24HR TD PT24: 21.0000 mg | MEDICATED_PATCH | Freq: Every day | TRANSDERMAL | Status: DC

## 2013-04-30 MED ORDER — CYCLOBENZAPRINE HCL 10 MG PO TABS: 10.0000 mg | ORAL_TABLET | Freq: Three times a day (TID) | ORAL | Status: DC | PRN

## 2013-04-30 MED ORDER — ZOLPIDEM TARTRATE 10 MG PO TABS: 10.0000 mg | ORAL_TABLET | Freq: Every evening | ORAL | Status: DC | PRN

## 2013-04-30 MED ORDER — MAGNESIUM OXIDE 400 (241.3 MG) MG PO TABS
200.0000 mg | ORAL_TABLET | Freq: Every day | ORAL | Status: DC
  Filled 2013-04-30: qty 0.5

## 2013-04-30 MED ORDER — SUMATRIPTAN SUCCINATE 100 MG PO TABS
100.0000 mg | ORAL_TABLET | ORAL | Status: DC | PRN
  Filled 2013-04-30: qty 1

## 2013-04-30 MED ORDER — VITAMIN C 250 MG PO TABS
250.0000 mg | ORAL_TABLET | Freq: Every day | ORAL | Status: DC
  Filled 2013-04-30: qty 1

## 2013-04-30 MED ORDER — ALBUTEROL SULFATE (5 MG/ML) 0.5% IN NEBU: 2.5000 mg | INHALATION_SOLUTION | Freq: Four times a day (QID) | RESPIRATORY_TRACT | Status: DC | PRN

## 2013-04-30 NOTE — ED Provider Notes (Signed)
History    CSN: [id number] Arrival date & time 04/30/13  1717  First MD Initiated Contact with Patient 04/30/13 1743     Chief Complaint  Patient presents with  . Medical Clearance   (Consider location/radiation/quality/duration/timing/severity/associated sxs/prior Treatment) The history is provided by the patient. No language interpreter was used.  Sarah Reeves is a 52 y/o F with PMHx of asthma, Hep C, anxiety, presenting to the ED by enforcement of husband - stated to come to the ED or else the police will bring her. Patient reported that she has been feeling rather depressed for the past year, stated that she has been having a lot of issues with husband and his health, reported that he has been having a lot of surgeries that has made her stressed out. Stated that with the depression she has been drinking alcohol. Stated that she drinks at least 4-5 of the small bottles of wine, at least 3-4 times per week or sometimes every few days - patient unable to give exact amount of. 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 Renae Gloss. Patient reported that she has fleeting thoughts of SI without a plan. Reported having difficulty sleeping and concentration. Denied chest pain, shortness of breath, difficulty breathing, abdominal pain, nausea, vomiting, diarrhea, urinary symptoms, changes to appetite.  PCP Dr. Kirtland Bouchard. Renae Gloss  Past Medical History  Diagnosis Date  . Asthma   . Hepatitis C   . Hyperlipemia    Past Surgical History  Procedure Laterality Date  . Breast reduction surgery    . Breast enhancement surgery    . Temporomandibular joint surgery    . Tubal ligation    . Exploratory laparotomy     No family history on file. History  Substance Use Topics  . Smoking status: Current Every Day Smoker    Types: Cigarettes  . Smokeless tobacco: Never Used  . Alcohol Use: Yes   OB History   Grav Para Term Preterm Abortions TAB SAB  Ect Mult Living                 Review of Systems  Constitutional: Negative for fever and chills.  HENT: Negative for sore throat and neck pain.   Eyes: Negative for visual disturbance.  Respiratory: Negative for chest tightness and shortness of breath.   Cardiovascular: Negative for chest pain.  Gastrointestinal: Negative for nausea, vomiting, abdominal pain and constipation.  Genitourinary: Negative for decreased urine volume and difficulty urinating.  Neurological: Negative for dizziness, weakness, light-headedness and numbness.  Psychiatric/Behavioral: Positive for dysphoric mood. Negative for confusion, sleep disturbance and agitation.  All other systems reviewed and are negative.    Allergies  Oysters  Home Medications   Current Outpatient Rx  Name  Route  Sig  Dispense  Refill  . Ascorbic Acid (VITAMIN C PO)   Oral   Take 1 tablet by mouth daily.         . Cholecalciferol (VITAMIN D PO)   Oral   Take 1 tablet by mouth daily.         . cyclobenzaprine (FLEXERIL) 10 MG tablet   Oral   Take 10 mg by mouth 3 (three) times daily as needed for muscle spasms.         Marland Kitchen HYDROCORTISONE PO   Oral   Take 2.5 mg by mouth 2 (two) times daily. 2.5 mg in the morning and 2.5 in the early after noon. Made  at [facility]         . LORazepam (ATIVAN) 1 MG tablet   Oral   Take 1 mg by mouth 2 (two) times daily as needed.         Marland Kitchen MAGNESIUM OXIDE PO   Oral   Take 1 tablet by mouth daily.         . Misc Natural Products (PROGESTERONE EX)   Apply externally   Apply 4 application topically.         . Misc Natural Products (PROGESTERONE EX)   Apply externally   Apply 1 application topically at bedtime. 4 % topical solution apply .5 ml at bedtime         . OVER THE COUNTER MEDICATION   Oral   Take 2 tablets by mouth 2 (two) times daily. Corticare- B complex vitamin  Made at [facility]         . PRESCRIPTION MEDICATION   Topical    Apply 1 application topically See admin instructions. biest cream c 0.5 mg/ ml 80/20 (estriol and estradial) 1 ml daily Made at [facility]         . PRESCRIPTION MEDICATION   Oral   Take 50 mg by mouth at bedtime. progesterone  50 mg sustained release capsules Made at [facility]         . traMADol (ULTRAM) 50 MG tablet   Oral   Take 50 mg by mouth daily.         . valACYclovir (VALTREX) 500 MG tablet   Oral   Take 500 mg by mouth 2 (two) times daily.         Marland Kitchen zolpidem (AMBIEN) 10 MG tablet   Oral   Take 10 mg by mouth at bedtime as needed for sleep.         Marland Kitchen albuterol (PROVENTIL) (2.5 MG/3ML) 0.083% nebulizer solution   Nebulization   Take 2.5 mg by nebulization every 6 (six) hours as needed.         . cetirizine (ZYRTEC) 10 MG tablet   Oral   Take 10 mg by mouth daily.         Marland Kitchen EXPIRED: levalbuterol (XOPENEX) 0.63 MG/3ML nebulizer solution   Nebulization   Take 3 mLs (0.63 mg total) by nebulization once.   3 mL   12   . SUMAtriptan (IMITREX) 100 MG tablet   Oral   Take 100 mg by mouth every 2 (two) hours as needed for migraine.          BP 119/84  Pulse 108  Temp(Src) 98.8 F (37.1 C) (Oral)  Resp 17  SpO2 97% Physical Exam  Nursing note and vitals reviewed. Constitutional: She is oriented to person, place, and time. She appears well-developed and well-nourished. No distress.  HENT:  Head: Normocephalic and atraumatic.  Mouth/Throat: Oropharynx is clear and moist. No oropharyngeal exudate.  Eyes: Conjunctivae and EOM are normal. Pupils are equal, round, and reactive to light. Right eye exhibits no discharge. Left eye exhibits no discharge.  Neck: Normal range of motion. Neck supple.  Negative neck stiffness Negative nuchal rigidity Negative pain upon palpation to the cervical spine  Cardiovascular: Normal rate, regular rhythm and normal heart sounds.  Exam reveals no friction rub.   No murmur heard. Pulses:      Radial  pulses are 2+ on the right side, and 2+ on the left side.       Dorsalis pedis pulses  are 2+ on the right side, and 2+ on the left side.  Normal rate upon physical exam, not tachycardic  Pulmonary/Chest: Effort normal and breath sounds normal. No respiratory distress. She has no wheezes. She has no rales.  Abdominal: Soft. Bowel sounds are normal. She exhibits no distension. There is no tenderness. There is no rebound and no guarding.  Musculoskeletal: Normal range of motion.  Full ROM to upper and lower extremities Strength 5+/5+ with resistance  Lymphadenopathy:    She has no cervical adenopathy.  Neurological: She is alert and oriented to person, place, and time. No cranial nerve deficit. She exhibits normal muscle tone. Coordination normal.  Cranial nerves III-XII grossly intact  Skin: Skin is warm and dry. No rash noted. She is not diaphoretic. No erythema.  Psychiatric: She has a normal mood and affect. Her behavior is normal. Thought content normal.    ED Course  Procedures (including critical care time) Labs Reviewed  COMPREHENSIVE METABOLIC PANEL - Abnormal; Notable for the following:    ALT 40 (*)    Total Bilirubin 0.1 (*)    All other components within normal limits  ETHANOL - Abnormal; Notable for the following:    Alcohol, Ethyl (B) 167 (*)    All other components within normal limits  SALICYLATE LEVEL - Abnormal; Notable for the following:    Salicylate Lvl <2.0 (*)    All other components within normal limits  ACETAMINOPHEN LEVEL  CBC  URINE RAPID DRUG SCREEN (HOSP PERFORMED)   No results found. 1. ETOH abuse   2. Hepatitis C, without hepatic coma   3. Anxiety   4. Depression     MDM  Patient presenting to ED with depression and ETOH abuse. Denied SI and HI at the moment. Stated that she has been turning to her pastor and church group for aid in depression.   Act consult performed at 7:15PM with [facility]. Urine drug screen negative findings. Tylenol, salicylate  negative. Elevation in alcohol noted (167). CBC and CMP negative findings. Patient medically cleared and brought to psych ED. Act saw and assessed patient - patient does not want help. Referrals given. Discussed case with Dr. Cheri Rous - recommended and cleared patient for discharge. Discharged patient. Resource guide given. Discussed with patient to follow-up with [facility]. Referred to PCP. Discussed with patient to continue to monitor symptoms and if symptoms worsen or change to report back to the ED - strict return instructions given. Patient agreed to plan of care, understood, all questions answered.   Raymon Mutton, PA-C 04/30/13 2132

## 2013-04-30 NOTE — ED Notes (Signed)
Pt changed into blue scrubs. Security at bedside to wand pt and pt's belongings.

## 2013-04-30 NOTE — Consult Note (Signed)
Reason for Consult ED admission IVC by husband for her drinking0-pt denies needing to be here.Has her own plan for dealing with her drinking problem with her pastor Referring Physician: Dr Wyline Mood ED   Sarah Reeves is an 52 y.o. female.  HPI: See ED admission note and reason for consult  Past Medical History  Diagnosis Date  . Asthma   . Hepatitis C   . Hyperlipemia     Past Surgical History  Procedure Laterality Date  . Breast reduction surgery    . Breast enhancement surgery    . Temporomandibular joint surgery    . Tubal ligation    . Exploratory laparotomy      No family history on file.  Social History:  reports that she has been smoking Cigarettes.  She has been smoking about 0.00 packs per day. She has never used smokeless tobacco. She reports that  drinks alcohol. She reports that she does not use illicit drugs.  Allergies:  Allergies  Allergen Reactions  . Oysters (Shellfish Allergy)     Unknown     Medications: I have reviewed the patient's current medications.  Results for orders placed during the hospital encounter of 04/30/13 (from the past 48 hour(s))  ACETAMINOPHEN LEVEL     Status: None   Collection Time    04/30/13  5:24 PM      Result Value Range   Acetaminophen (Tylenol), Serum <15.0  10 - 30 ug/mL   Comment:            THERAPEUTIC CONCENTRATIONS VARY     SIGNIFICANTLY. A RANGE OF 10-30     ug/mL MAY BE AN EFFECTIVE     CONCENTRATION FOR MANY PATIENTS.     HOWEVER, SOME ARE BEST TREATED     AT CONCENTRATIONS OUTSIDE THIS     RANGE.     ACETAMINOPHEN CONCENTRATIONS     >150 ug/mL AT 4 HOURS AFTER     INGESTION AND >50 ug/mL AT 12     HOURS AFTER INGESTION ARE     OFTEN ASSOCIATED WITH TOXIC     REACTIONS.  CBC     Status: None   Collection Time    04/30/13  5:24 PM      Result Value Range   WBC 7.7  4.0 - 10.5 K/uL   RBC 4.69  3.87 - 5.11 MIL/uL   Hemoglobin 14.1  12.0 - 15.0 g/dL   HCT 04.5  40.9 - 81.1 %   MCV 89.6  78.0 - 100.0 fL    MCH 30.1  26.0 - 34.0 pg   MCHC 33.6  30.0 - 36.0 g/dL   RDW 91.4  78.2 - 95.6 %   Platelets 224  150 - 400 K/uL  COMPREHENSIVE METABOLIC PANEL     Status: Abnormal   Collection Time    04/30/13  5:24 PM      Result Value Range   Sodium 135  135 - 145 mEq/L   Potassium 4.4  3.5 - 5.1 mEq/L   Chloride 99  96 - 112 mEq/L   CO2 25  19 - 32 mEq/L   Glucose, Bld 97  70 - 99 mg/dL   BUN 7  6 - 23 mg/dL   Creatinine, Ser 2.13  0.50 - 1.10 mg/dL   Calcium 9.5  8.4 - 08.6 mg/dL   Total Protein 7.6  6.0 - 8.3 g/dL   Albumin 4.1  3.5 - 5.2 g/dL   AST 36  0 -  37 U/L   ALT 40 (*) 0 - 35 U/L   Alkaline Phosphatase 63  39 - 117 U/L   Total Bilirubin 0.1 (*) 0.3 - 1.2 mg/dL   GFR calc non Af Amer >90  >90 mL/min   GFR calc Af Amer >90  >90 mL/min   Comment:            The eGFR has been calculated     using the CKD EPI equation.     This calculation has not been     validated in all clinical     situations.     eGFR's persistently     <90 mL/min signify     possible Chronic Kidney Disease.  ETHANOL     Status: Abnormal   Collection Time    04/30/13  5:24 PM      Result Value Range   Alcohol, Ethyl (B) 167 (*) 0 - 11 mg/dL   Comment:            LOWEST DETECTABLE LIMIT FOR     SERUM ALCOHOL IS 11 mg/dL     FOR MEDICAL PURPOSES ONLY  SALICYLATE LEVEL     Status: Abnormal   Collection Time    04/30/13  5:24 PM      Result Value Range   Salicylate Lvl <2.0 (*) 2.8 - 20.0 mg/dL  URINE RAPID DRUG SCREEN (HOSP PERFORMED)     Status: None   Collection Time    04/30/13  6:00 PM      Result Value Range   Opiates NONE DETECTED  NONE DETECTED   Cocaine NONE DETECTED  NONE DETECTED   Benzodiazepines NONE DETECTED  NONE DETECTED   Amphetamines NONE DETECTED  NONE DETECTED   Tetrahydrocannabinol NONE DETECTED  NONE DETECTED   Barbiturates NONE DETECTED  NONE DETECTED   Comment:            DRUG SCREEN FOR MEDICAL PURPOSES     ONLY.  IF CONFIRMATION IS NEEDED     FOR ANY PURPOSE, NOTIFY LAB      WITHIN 5 DAYS.                LOWEST DETECTABLE LIMITS     FOR URINE DRUG SCREEN     Drug Class       Cutoff (ng/mL)     Amphetamine      1000     Barbiturate      200     Benzodiazepine   200     Tricyclics       300     Opiates          300     Cocaine          300     THC              50    No results found.  Review of Systems  Constitutional: Negative.   HENT: Negative.   Eyes: Negative.   Respiratory: Negative.   Cardiovascular: Negative.   Gastrointestinal: Negative.        Has hx of hep C  Genitourinary: Negative.   Musculoskeletal: Positive for myalgias.       Takes tramadol daily am for fibromyalgia  Skin: Negative.   Psychiatric/Behavioral: Positive for depression and substance abuse. Negative for suicidal ideas and hallucinations. The patient is nervous/anxious and has insomnia.        Distant hx 8-10 yrs ago of pill overdose followed by hospitalization.  Treated at [facility] 8 yrs ago for alcoholism and abuse issues-followed by alternative medicine at [facility] Which c/o "screwing me up-it made me worse" Chronic 10 mg ambien use/? dependence Benzodiazepene rx despite dx of chronic alcoholism-?dependence   Blood pressure 119/84, pulse 108, temperature 98.8 F (37.1 C), temperature source Oral, resp. rate 17, SpO2 97.00%. Physical Exam  Constitutional: She is oriented to person, place, and time. She appears well-developed and well-nourished.  HENT:  Head: Normocephalic.  Eyes: Conjunctivae are normal. Pupils are equal, round, and reactive to light. Right eye exhibits no discharge. Left eye exhibits no discharge. No scleral icterus.  Neck: Normal range of motion. Neck supple.  Cardiovascular: Normal rate.   Respiratory: Effort normal and breath sounds normal.  GI:  Deferred  Genitourinary:  Deferred  Musculoskeletal: Normal range of motion.  Neurological: She is alert and oriented to person, place, and time. She has normal reflexes.  Skin: Skin  is warm and dry.  Psychiatric: Her affect is blunt. Her speech is rapid and/or pressured and slurred. She is hyperactive. Thought content is not paranoid and not delusional. Cognition and memory are normal. She expresses impulsivity. She expresses no homicidal and no suicidal ideation. She expresses no suicidal plans and no homicidal plans.  ETOH 167    Assessment/Plan AXIS I-Alcoholism chronic/intermittent with acute intoxication            Hx of PTSD            Hx of Depression with distant hx of overdose x1            Substance induced Mood disorder            ? Ambien/ativan dependencies AXIS XB:[id number] AXIS III: Hep C; Allergy-asthma;GERD;Hx fibromyalgia AXIS IV: Older husband with chronic pain;mother with alzheimer's;chronic pains Axis V: GAF 60  PLAN: Advised ED MD pt may be discharged to pursue her plans for dealing with her current alcohol problem   Santia Labate E 04/30/2013, 8:17 PM

## 2013-04-30 NOTE — ED Notes (Signed)
Pt states her husband brought her here today. Pt states she doesn't know why she is here. Pt states she was sleeping and her husband demanded that she go to the hospital or he would have the police take her. Pt states she has a problem with alcohol. Pt states she drank some mini bottles of wine today. Last ETOH at 1600. Pt states she also took Ativan 3 mg since this morning and her husband's back pain pill. Pt denies SI. Pt states she has gone to AA before and is not going to go to that program again. Pt cooperative, but agitated. Pt has organized thought process.
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
## 2013-04-30 NOTE — ED Provider Notes (Signed)
Medical screening examination/treatment/procedure(s) were performed by non-physician practitioner and as supervising physician I was immediately available for consultation/collaboration.   Richardean Canal, MD 04/30/13 [phone]

## 2013-06-05 ENCOUNTER — Encounter (HOSPITAL_COMMUNITY): Payer: Self-pay | Admitting: Emergency Medicine

## 2013-06-05 ENCOUNTER — Emergency Department (HOSPITAL_COMMUNITY)
Admission: EM | Admit: 2013-06-05 | Discharge: 2013-06-06 | Disposition: A | Discharge status: Other Acute Inpatient Hospital | Payer: 59 | Attending: Emergency Medicine | Admitting: Emergency Medicine

## 2013-06-05 DIAGNOSIS — F17200 Nicotine dependence, unspecified, uncomplicated: Secondary | ICD-10-CM | POA: Insufficient documentation

## 2013-06-05 DIAGNOSIS — F1010 Alcohol abuse, uncomplicated: Secondary | ICD-10-CM | POA: Insufficient documentation

## 2013-06-05 DIAGNOSIS — F10929 Alcohol use, unspecified with intoxication, unspecified: Secondary | ICD-10-CM

## 2013-06-05 DIAGNOSIS — F332 Major depressive disorder, recurrent severe without psychotic features: Secondary | ICD-10-CM

## 2013-06-05 DIAGNOSIS — S93409A Sprain of unspecified ligament of unspecified ankle, initial encounter: Secondary | ICD-10-CM | POA: Insufficient documentation

## 2013-06-05 DIAGNOSIS — F32A Depression, unspecified: Secondary | ICD-10-CM

## 2013-06-05 DIAGNOSIS — S93402A Sprain of unspecified ligament of left ankle, initial encounter: Secondary | ICD-10-CM

## 2013-06-05 DIAGNOSIS — Z862 Personal history of diseases of the blood and blood-forming organs and certain disorders involving the immune mechanism: Secondary | ICD-10-CM | POA: Insufficient documentation

## 2013-06-05 DIAGNOSIS — R45851 Suicidal ideations: Secondary | ICD-10-CM | POA: Insufficient documentation

## 2013-06-05 DIAGNOSIS — Y92410 Unspecified street and highway as the place of occurrence of the external cause: Secondary | ICD-10-CM | POA: Insufficient documentation

## 2013-06-05 DIAGNOSIS — Z8619 Personal history of other infectious and parasitic diseases: Secondary | ICD-10-CM | POA: Insufficient documentation

## 2013-06-05 DIAGNOSIS — F1020 Alcohol dependence, uncomplicated: Secondary | ICD-10-CM

## 2013-06-05 DIAGNOSIS — F333 Major depressive disorder, recurrent, severe with psychotic symptoms: Secondary | ICD-10-CM | POA: Diagnosis present

## 2013-06-05 DIAGNOSIS — Z79899 Other long term (current) drug therapy: Secondary | ICD-10-CM | POA: Insufficient documentation

## 2013-06-05 DIAGNOSIS — Z8639 Personal history of other endocrine, nutritional and metabolic disease: Secondary | ICD-10-CM | POA: Insufficient documentation

## 2013-06-05 DIAGNOSIS — IMO0002 Reserved for concepts with insufficient information to code with codable children: Secondary | ICD-10-CM | POA: Insufficient documentation

## 2013-06-05 DIAGNOSIS — F329 Major depressive disorder, single episode, unspecified: Secondary | ICD-10-CM

## 2013-06-05 DIAGNOSIS — J45909 Unspecified asthma, uncomplicated: Secondary | ICD-10-CM | POA: Insufficient documentation

## 2013-06-05 DIAGNOSIS — Y9389 Activity, other specified: Secondary | ICD-10-CM | POA: Insufficient documentation

## 2013-06-05 LAB — CBC WITH DIFFERENTIAL/PLATELET
Basophils Absolute: 0 10*3/uL (ref 0.0–0.1)
Basophils Relative: 0 % (ref 0–1)
Eosinophils Absolute: 0.3 10*3/uL (ref 0.0–0.7)
Eosinophils Relative: 3 % (ref 0–5)
HCT: 37.5 % (ref 36.0–46.0)
Hemoglobin: 12.8 g/dL (ref 12.0–15.0)
Lymphocytes Relative: 47 % — ABNORMAL HIGH (ref 12–46)
Lymphs Abs: 5.3 10*3/uL — ABNORMAL HIGH (ref 0.7–4.0)
MCH: 30.3 pg (ref 26.0–34.0)
MCHC: 34.1 g/dL (ref 30.0–36.0)
MCV: 88.9 fL (ref 78.0–100.0)
Monocytes Absolute: 0.5 10*3/uL (ref 0.1–1.0)
Monocytes Relative: 5 % (ref 3–12)
Neutro Abs: 5.1 10*3/uL (ref 1.7–7.7)
Neutrophils Relative %: 45 % (ref 43–77)
Platelets: 218 10*3/uL (ref 150–400)
RBC: 4.22 MIL/uL (ref 3.87–5.11)
RDW: 12.9 % (ref 11.5–15.5)
WBC: 11.4 10*3/uL — ABNORMAL HIGH (ref 4.0–10.5)

## 2013-06-05 LAB — POCT I-STAT, CHEM 8
BUN: 7 mg/dL (ref 6–23)
Calcium, Ion: 1.1 mmol/L — ABNORMAL LOW (ref 1.12–1.23)
Chloride: 100 mEq/L (ref 96–112)
Creatinine, Ser: 1.2 mg/dL — ABNORMAL HIGH (ref 0.50–1.10)
Glucose, Bld: 90 mg/dL (ref 70–99)
HCT: 41 % (ref 36.0–46.0)
Hemoglobin: 13.9 g/dL (ref 12.0–15.0)
Potassium: 3.8 mEq/L (ref 3.5–5.1)
Sodium: 135 mEq/L (ref 135–145)
TCO2: 23 mmol/L (ref 0–100)

## 2013-06-05 LAB — ETHANOL: Alcohol, Ethyl (B): 281 mg/dL — ABNORMAL HIGH (ref 0–11)

## 2013-06-05 LAB — URINALYSIS, ROUTINE W REFLEX MICROSCOPIC
Bilirubin Urine: NEGATIVE
Glucose, UA: NEGATIVE mg/dL
Hgb urine dipstick: NEGATIVE
Ketones, ur: NEGATIVE mg/dL
Leukocytes, UA: NEGATIVE
Nitrite: NEGATIVE
Protein, ur: NEGATIVE mg/dL
Specific Gravity, Urine: 1.012 (ref 1.005–1.030)
Urobilinogen, UA: 0.2 mg/dL (ref 0.0–1.0)
pH: 6 (ref 5.0–8.0)

## 2013-06-05 LAB — RAPID URINE DRUG SCREEN, HOSP PERFORMED
Amphetamines: NOT DETECTED
Barbiturates: NOT DETECTED
Benzodiazepines: NOT DETECTED
Cocaine: NOT DETECTED
Opiates: NOT DETECTED
Tetrahydrocannabinol: NOT DETECTED

## 2013-06-05 MED ORDER — ONDANSETRON 8 MG PO TBDP
8.0000 mg | ORAL_TABLET | Freq: Once | ORAL | Status: AC
  Administered 2013-06-06: 8 mg via ORAL
  Filled 2013-06-05: qty 1
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
## 2013-06-05 NOTE — ED Notes (Signed)
Bed: WA13 Expected date:  Expected time:  Means of arrival:  Comments: EMS 

## 2013-06-06 ENCOUNTER — Encounter (HOSPITAL_COMMUNITY): Payer: Self-pay | Admitting: *Deleted

## 2013-06-06 ENCOUNTER — Encounter (HOSPITAL_COMMUNITY): Payer: Self-pay | Admitting: Registered Nurse

## 2013-06-06 ENCOUNTER — Emergency Department (HOSPITAL_COMMUNITY): Payer: 59

## 2013-06-06 ENCOUNTER — Inpatient Hospital Stay (HOSPITAL_COMMUNITY)
Admission: EM | Admit: 2013-06-06 | Discharge: 2013-06-09 | DRG: 885 | Disposition: A | Discharge status: Home / Self Care | Payer: 59 | Source: Intra-hospital | Attending: Psychiatry | Admitting: Psychiatry

## 2013-06-06 DIAGNOSIS — R45851 Suicidal ideations: Secondary | ICD-10-CM

## 2013-06-06 DIAGNOSIS — Z79899 Other long term (current) drug therapy: Secondary | ICD-10-CM

## 2013-06-06 DIAGNOSIS — F10229 Alcohol dependence with intoxication, unspecified: Secondary | ICD-10-CM

## 2013-06-06 DIAGNOSIS — J309 Allergic rhinitis, unspecified: Secondary | ICD-10-CM

## 2013-06-06 DIAGNOSIS — F1020 Alcohol dependence, uncomplicated: Secondary | ICD-10-CM

## 2013-06-06 DIAGNOSIS — F10939 Alcohol use, unspecified with withdrawal, unspecified: Secondary | ICD-10-CM

## 2013-06-06 DIAGNOSIS — F332 Major depressive disorder, recurrent severe without psychotic features: Principal | ICD-10-CM | POA: Diagnosis present

## 2013-06-06 DIAGNOSIS — F333 Major depressive disorder, recurrent, severe with psychotic symptoms: Secondary | ICD-10-CM | POA: Diagnosis present

## 2013-06-06 DIAGNOSIS — F10129 Alcohol abuse with intoxication, unspecified: Secondary | ICD-10-CM

## 2013-06-06 DIAGNOSIS — K219 Gastro-esophageal reflux disease without esophagitis: Secondary | ICD-10-CM

## 2013-06-06 DIAGNOSIS — F411 Generalized anxiety disorder: Secondary | ICD-10-CM | POA: Diagnosis present

## 2013-06-06 DIAGNOSIS — B1710 Acute hepatitis C without hepatic coma: Secondary | ICD-10-CM

## 2013-06-06 DIAGNOSIS — F10239 Alcohol dependence with withdrawal, unspecified: Secondary | ICD-10-CM

## 2013-06-06 DIAGNOSIS — B1920 Unspecified viral hepatitis C without hepatic coma: Secondary | ICD-10-CM | POA: Diagnosis present

## 2013-06-06 DIAGNOSIS — F1010 Alcohol abuse, uncomplicated: Secondary | ICD-10-CM | POA: Diagnosis present

## 2013-06-06 DIAGNOSIS — J45909 Unspecified asthma, uncomplicated: Secondary | ICD-10-CM

## 2013-06-06 HISTORY — DX: Mental disorder, not otherwise specified: F99

## 2013-06-06 HISTORY — DX: Anxiety disorder, unspecified: F41.9

## 2013-06-06 HISTORY — DX: Depression, unspecified: F32.A

## 2013-06-06 HISTORY — DX: Major depressive disorder, single episode, unspecified: F32.9

## 2013-06-06 MED ORDER — ONDANSETRON 4 MG PO TBDP: 4.0000 mg | ORAL_TABLET | Freq: Four times a day (QID) | ORAL | Status: DC | PRN

## 2013-06-06 MED ORDER — CHLORDIAZEPOXIDE HCL 25 MG PO CAPS
25.0000 mg | ORAL_CAPSULE | Freq: Four times a day (QID) | ORAL | Status: AC
  Administered 2013-06-06 – 2013-06-07 (×4): 25 mg via ORAL
  Filled 2013-06-06 (×4): qty 1

## 2013-06-06 MED ORDER — HYDROCORTISONE 5 MG PO TABS
2.5000 mg | ORAL_TABLET | Freq: Two times a day (BID) | ORAL | Status: DC
  Administered 2013-06-06: 2.5 mg via ORAL
  Filled 2013-06-06 (×3): qty 1

## 2013-06-06 MED ORDER — LOPERAMIDE HCL 2 MG PO CAPS: 2.0000 mg | ORAL_CAPSULE | ORAL | Status: DC | PRN

## 2013-06-06 MED ORDER — ACETAMINOPHEN 325 MG PO TABS: 650.0000 mg | ORAL_TABLET | Freq: Four times a day (QID) | ORAL | Status: DC | PRN

## 2013-06-06 MED ORDER — VITAMIN B-1 100 MG PO TABS
100.0000 mg | ORAL_TABLET | Freq: Every day | ORAL | Status: DC
  Administered 2013-06-06: 100 mg via ORAL
  Filled 2013-06-06: qty 1

## 2013-06-06 MED ORDER — SERTRALINE HCL 50 MG PO TABS
50.0000 mg | ORAL_TABLET | Freq: Every morning | ORAL | Status: DC
  Administered 2013-06-08: 50 mg via ORAL
  Filled 2013-06-06 (×3): qty 1

## 2013-06-06 MED ORDER — CHLORDIAZEPOXIDE HCL 25 MG PO CAPS: 25.0000 mg | ORAL_CAPSULE | Freq: Every day | ORAL | Status: DC

## 2013-06-06 MED ORDER — VALACYCLOVIR HCL 500 MG PO TABS
500.0000 mg | ORAL_TABLET | Freq: Two times a day (BID) | ORAL | Status: DC
  Filled 2013-06-06 (×2): qty 1

## 2013-06-06 MED ORDER — LORAZEPAM 1 MG PO TABS
0.0000 mg | ORAL_TABLET | Freq: Four times a day (QID) | ORAL | Status: DC
  Administered 2013-06-06: 1 mg via ORAL
  Administered 2013-06-06: 2 mg via ORAL
  Administered 2013-06-06: 1 mg via ORAL
  Filled 2013-06-06 (×4): qty 1

## 2013-06-06 MED ORDER — SERTRALINE HCL 50 MG PO TABS
50.0000 mg | ORAL_TABLET | Freq: Every morning | ORAL | Status: DC
  Filled 2013-06-06: qty 1

## 2013-06-06 MED ORDER — CHLORDIAZEPOXIDE HCL 25 MG PO CAPS
25.0000 mg | ORAL_CAPSULE | Freq: Once | ORAL | Status: AC
  Administered 2013-06-06: 25 mg via ORAL
  Filled 2013-06-06: qty 1

## 2013-06-06 MED ORDER — ALUM & MAG HYDROXIDE-SIMETH 200-200-20 MG/5ML PO SUSP: 30.0000 mL | ORAL | Status: DC | PRN

## 2013-06-06 MED ORDER — LORAZEPAM 1 MG PO TABS: 0.0000 mg | ORAL_TABLET | Freq: Two times a day (BID) | ORAL | Status: DC

## 2013-06-06 MED ORDER — TRAMADOL HCL 50 MG PO TABS
50.0000 mg | ORAL_TABLET | Freq: Once | ORAL | Status: AC
  Administered 2013-06-06: 50 mg via ORAL
  Filled 2013-06-06: qty 1

## 2013-06-06 MED ORDER — NICOTINE 21 MG/24HR TD PT24
21.0000 mg | MEDICATED_PATCH | Freq: Every day | TRANSDERMAL | Status: DC
  Filled 2013-06-06 (×4): qty 1

## 2013-06-06 MED ORDER — ALBUTEROL SULFATE HFA 108 (90 BASE) MCG/ACT IN AERS: 1.0000 | INHALATION_SPRAY | RESPIRATORY_TRACT | Status: DC | PRN

## 2013-06-06 MED ORDER — MAGNESIUM HYDROXIDE 400 MG/5ML PO SUSP
30.0000 mL | Freq: Every day | ORAL | Status: DC | PRN
  Administered 2013-06-07 – 2013-06-08 (×2): 30 mL via ORAL

## 2013-06-06 MED ORDER — CHLORDIAZEPOXIDE HCL 25 MG PO CAPS
25.0000 mg | ORAL_CAPSULE | ORAL | Status: DC
  Administered 2013-06-09: 25 mg via ORAL
  Filled 2013-06-06: qty 1

## 2013-06-06 MED ORDER — HYDROCORTISONE 5 MG PO TABS
2.5000 mg | ORAL_TABLET | Freq: Two times a day (BID) | ORAL | Status: DC
  Administered 2013-06-07 – 2013-06-09 (×5): 2.5 mg via ORAL
  Filled 2013-06-06 (×9): qty 1

## 2013-06-06 MED ORDER — MELOXICAM 15 MG PO TABS
15.0000 mg | ORAL_TABLET | Freq: Every morning | ORAL | Status: DC
  Filled 2013-06-06: qty 1

## 2013-06-06 MED ORDER — IBUPROFEN 200 MG PO TABS
600.0000 mg | ORAL_TABLET | Freq: Three times a day (TID) | ORAL | Status: DC | PRN
  Administered 2013-06-06: 600 mg via ORAL
  Filled 2013-06-06 (×2): qty 3

## 2013-06-06 MED ORDER — ONDANSETRON HCL 4 MG PO TABS: 4.0000 mg | ORAL_TABLET | Freq: Three times a day (TID) | ORAL | Status: DC | PRN

## 2013-06-06 MED ORDER — VITAMIN B-1 100 MG PO TABS
100.0000 mg | ORAL_TABLET | Freq: Every day | ORAL | Status: DC
  Administered 2013-06-07 – 2013-06-09 (×3): 100 mg via ORAL
  Filled 2013-06-06 (×5): qty 1

## 2013-06-06 MED ORDER — LORATADINE 10 MG PO TABS
10.0000 mg | ORAL_TABLET | Freq: Every day | ORAL | Status: DC
  Administered 2013-06-06: 10 mg via ORAL
  Filled 2013-06-06: qty 1

## 2013-06-06 MED ORDER — NICOTINE 21 MG/24HR TD PT24
21.0000 mg | MEDICATED_PATCH | Freq: Every day | TRANSDERMAL | Status: DC
  Filled 2013-06-06: qty 1

## 2013-06-06 MED ORDER — THIAMINE HCL 100 MG/ML IJ SOLN: 100.0000 mg | Freq: Every day | INTRAMUSCULAR | Status: DC

## 2013-06-06 MED ORDER — HYDROXYZINE HCL 25 MG PO TABS
25.0000 mg | ORAL_TABLET | Freq: Four times a day (QID) | ORAL | Status: DC | PRN
  Administered 2013-06-07 – 2013-06-08 (×3): 25 mg via ORAL

## 2013-06-06 MED ORDER — ADULT MULTIVITAMIN W/MINERALS CH
1.0000 | ORAL_TABLET | Freq: Every day | ORAL | Status: DC
Start: 2013-06-06 — End: 2013-06-09
  Administered 2013-06-07 – 2013-06-09 (×3): 1 via ORAL
  Filled 2013-06-06 (×6): qty 1

## 2013-06-06 MED ORDER — LORATADINE 10 MG PO TABS
10.0000 mg | ORAL_TABLET | Freq: Every day | ORAL | Status: DC
  Administered 2013-06-07 – 2013-06-09 (×3): 10 mg via ORAL
  Filled 2013-06-06 (×5): qty 1

## 2013-06-06 MED ORDER — CHLORDIAZEPOXIDE HCL 25 MG PO CAPS
25.0000 mg | ORAL_CAPSULE | Freq: Four times a day (QID) | ORAL | Status: DC | PRN
  Administered 2013-06-07: 25 mg via ORAL
  Filled 2013-06-06: qty 1

## 2013-06-06 MED ORDER — VALACYCLOVIR HCL 500 MG PO TABS
500.0000 mg | ORAL_TABLET | Freq: Two times a day (BID) | ORAL | Status: DC
  Administered 2013-06-08: 500 mg via ORAL
  Filled 2013-06-06 (×6): qty 1

## 2013-06-06 MED ORDER — MELOXICAM 15 MG PO TABS
15.0000 mg | ORAL_TABLET | Freq: Every morning | ORAL | Status: DC
  Administered 2013-06-08: 15 mg via ORAL
  Filled 2013-06-06 (×3): qty 1

## 2013-06-06 MED ORDER — PNEUMOCOCCAL VAC POLYVALENT 25 MCG/0.5ML IJ INJ: 0.5000 mL | INJECTION | INTRAMUSCULAR | Status: AC

## 2013-06-06 MED ORDER — THIAMINE HCL 100 MG/ML IJ SOLN: 100.0000 mg | Freq: Once | INTRAMUSCULAR | Status: DC

## 2013-06-06 MED ORDER — CHLORDIAZEPOXIDE HCL 25 MG PO CAPS
25.0000 mg | ORAL_CAPSULE | Freq: Three times a day (TID) | ORAL | Status: AC
  Administered 2013-06-08 (×2): 25 mg via ORAL
  Filled 2013-06-06 (×2): qty 1
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
## 2013-06-06 NOTE — BH Assessment (Signed)
[facility] Assessment Progress Note  06/06/13 0145. Attempt made to assess pt at this time, however, pt appears to still be significantly intoxicated.  Discussed with Dr Norlene Campbell of [facility], who agrees that we will let pt sober up before attempting full ACT assessment. Daleen Squibb, LCSW

## 2013-06-06 NOTE — ED Notes (Addendum)
Pt belongings consist of: blue pouch bag with makeup inside. Clipper/razor, white straw hat, tan bra, blank pants, white blouse, black pajamas with pink and red hearts, big leather brown bag, big straw bag, silver earrings, wedding band with emerald green ring, silver necklace (broken), slides,  black pony tail band. Patient's belongings stored in locker #33.

## 2013-06-06 NOTE — ED Notes (Signed)
Pt states she cant take ibuprofen due to ulcer. Pt requesting tramadol for her pain.
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
## 2013-06-06 NOTE — ED Notes (Signed)
Pt's spouse brought in pt's black purse and wallet along with white ankle sling, cell phone, 2 pair of eye glasses.   Pt's spouse and pt informed pt cant have purse that it will have to be locked up in locker and money taken to security office or her spouse can take back to his car/home. Pt's spouse took purse with money back to his car. Pt kept 2 pair of reading/eye glasses.  Put in locker #33, pt's cell phone, white ankle brace.
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
## 2013-06-06 NOTE — ED Notes (Signed)
ACT team at bedside.  
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
## 2013-06-06 NOTE — Tx Team (Signed)
Initial Interdisciplinary Treatment Plan  PATIENT STRENGTHS: (choose at least two) Ability for insight Average or above average intelligence Capable of independent living General fund of knowledge  PATIENT STRESSORS: Marital or family conflict Substance abuse   PROBLEM LIST: Problem List/Patient Goals Date to be addressed Date deferred Reason deferred Estimated date of resolution  Depression 06/06/13     ETOH abuse 06/06/13                                                DISCHARGE CRITERIA:  Ability to meet basic life and health needs Improved stabilization in mood, thinking, and/or behavior Verbal commitment to aftercare and medication compliance Withdrawal symptoms are absent or subacute and managed without 24-hour nursing intervention  PRELIMINARY DISCHARGE PLAN: Attend aftercare/continuing care group Return to previous living arrangement  PATIENT/FAMIILY INVOLVEMENT: This treatment plan has been presented to and reviewed with the patient, Sarah Reeves, and/or family member, .  The patient and family have been given the opportunity to ask questions and make suggestions.  [name], [place] 06/06/2013, 7:59 PM

## 2013-06-06 NOTE — BHH Counselor (Signed)
Pt accepted by [facility] / Dr. Lucianne Muss to [facility] to room 501-2 and will go by security to [facility].

## 2013-06-07 DIAGNOSIS — F1994 Other psychoactive substance use, unspecified with psychoactive substance-induced mood disorder: Secondary | ICD-10-CM

## 2013-06-07 DIAGNOSIS — F332 Major depressive disorder, recurrent severe without psychotic features: Principal | ICD-10-CM

## 2013-06-07 DIAGNOSIS — F1020 Alcohol dependence, uncomplicated: Secondary | ICD-10-CM

## 2013-06-07 MED ORDER — IBUPROFEN 600 MG PO TABS
600.0000 mg | ORAL_TABLET | Freq: Four times a day (QID) | ORAL | Status: DC | PRN
  Administered 2013-06-07 (×2): 600 mg via ORAL
  Filled 2013-06-07 (×3): qty 1

## 2013-06-07 MED ORDER — NICOTINE POLACRILEX 2 MG MT GUM
2.0000 mg | CHEWING_GUM | OROMUCOSAL | Status: DC | PRN
  Administered 2013-06-07 – 2013-06-09 (×3): 2 mg via ORAL

## 2013-06-07 NOTE — Progress Notes (Signed)
Pt's husband brought in pt's home hormone creams, placed in pt's med drawer, notified next shift RN to ask night PA for order for this medication.

## 2013-06-07 NOTE — Progress Notes (Signed)
D) Pt. Appears flat and depressed. Movement is slowed and pt. Appears sickly with blanket wrapped around her and c/o mild "cold symptoms".  Pt. Denies W/d symptoms, but does c/o stomach upset which she relates to constipation.  Pt. Received MOM with no result to this point. Pt. Denies SI per self inventory assessment.  Pt. Attended group, met with MD, and attended lunch. CIWA is at a 1.  A) Support offered and medications given per order. R) Pt. Remains safe on q 15 min. Observations.
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
## 2013-06-07 NOTE — BHH Group Notes (Signed)
Adult Psychoeducational Group Note  Date:  06/07/2013 Time:  12:22 AM  Group Topic/Focus:  Wrap-Up Group:   The focus of this group is to help patients review their daily goal of treatment and discuss progress on daily workbooks.  Participation Level:  Did Not Attend  Participation Quality:  Did not attend  Affect:  Did not attend  Cognitive:  Did not attend  Insight: None  Engagement in Group:  Did not attend  Modes of Intervention:  Did not attend  Additional Comments:  Pt did not attend group.  She stated her ankles were swollen and she was unable to walk to group.  The pt refused any cold/hot packs for her ankles.  Aundria Rud, Haruki Arnold L 06/07/2013, 12:22 AM

## 2013-06-07 NOTE — Progress Notes (Signed)
Adult Psychoeducational Group Note  Date:  06/07/2013 Time:  10:54 PM  Group Topic/Focus:  Goals Group:   The focus of this group is to help patients establish daily goals to achieve during treatment and discuss how the patient can incorporate goal setting into their daily lives to aide in recovery.  Participation Level:  Active  Participation Quality:  Appropriate  Affect:  Appropriate  Cognitive:  Appropriate  Insight: Appropriate  Engagement in Group:  Engaged  Modes of Intervention:  Discussion  Additional Comments:  Pt stated that the day was ok, want to make sure she stays positive and make positive statements about herself.  Terie Purser R 06/07/2013, 10:54 PM
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
## 2013-06-07 NOTE — [facility] Group Notes (Signed)
[facility] LCSW Group Therapy          Overcoming Obstacles       1:15 -2:30        06/07/2013   3:36 PM     Type of Therapy:  Group Therapy  Participation Level:  Appropriate  Participation Quality:  Appropriate  Affect:  Appropriate, Alert  Cognitive:  Attentive Appropriate  Insight: Developing/Improving Engaged  Engagement in Therapy: Developing/Imprvoing Engaged  Modes of Intervention:  Discussion Exploration  Education Rapport BuildingProblem-Solving Support  Summary of Progress/Problems:  The main focus of today's group was overcoming  Obstacles.  She shared fear and isolation are her obstacles.  She states she fears make new relationships with others due to abandonment issues.   Wynn Banker 06/07/2013    3:36 PM
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
## 2013-06-07 NOTE — Progress Notes (Signed)
Patient ID: Sarah Reeves, female   DOB: September 30, 1961, 52 y.o.   MRN: [id number] D)  Has been pleasant and cooperative this evening, attended group, states still sore but didn't feel she needed anything for pain.  Was started on the Librium protocol for alcohol, went to bed shortly after, has been resting. A)  Will continue to monitor for safety, w/d sx,  continue POC R)  Safety maintained.

## 2013-06-07 NOTE — Tx Team (Signed)
Interdisciplinary Treatment Plan Update (Adult)  Date: 06/07/2013  Time Reviewed:  9:45 AM  Progress in Treatment: Attending groups: Yes Participating in groups:  Yes Taking medication as prescribed:  Yes Tolerating medication:  Yes Family/Significant othe contact made: CSW assessing  Patient understands diagnosis:  Yes Discussing patient identified problems/goals with staff:  Yes Medical problems stabilized or resolved:  Yes Denies suicidal/homicidal ideation: Yes Issues/concerns per patient self-inventory:  Yes Other:  New problem(s) identified: N/A  Discharge Plan or Barriers: CSW assessing for appropriate referrals.  Reason for Continuation of Hospitalization: Anxiety Depression Medication Stabilization  Comments: N/A  Estimated length of stay: 3-5 days  For review of initial/current patient goals, please see plan of care.  Attendees: Patient:     Family:     Physician:  Dr. Johnalagadda 06/07/2013 10:08 AM   Nursing:   Andrea Thorne, RN 06/07/2013 10:08 AM   Clinical Social Worker:  Vitaliy Eisenhour Horton, LCSWA 06/07/2013 10:08 AM   Other: Neil Mashburn, PA 06/07/2013 10:08 AM   Other:  Beverly Knight, RN 06/07/2013 10:08 AM   Other:  Quylle Hodnett, LCSW 06/07/2013 10:08 AM   Other:  Jennifer Clark, RN case manager 06/07/2013 10:08 AM   Other:    Other:    Other:    Other:    Other:    Other:     Scribe for Treatment Team:   Horton, Maryjane Benedict Nicole, 06/07/2013 10:08 AM    

## 2013-06-07 NOTE — Progress Notes (Signed)
Recreation Therapy Notes  Date: 08.18.2014 Time: 3:00pm Location: [address] Dayroom  Group Topic: Wellness  Goal Area(s) Addresses:  Patient will define components of whole wellness. Patient will verbalize benefit of whole wellness.  Behavioral Response: Engaged, Appropriate, Sharing   Intervention: Adapted Game  Activity: Wellness Jeopardy. In teams patients were asked trivia questions as it relates to the following categories:Fruits, Fitness, Veggies, Exercises, Producer, television/film/video .   Education: Wellness, Discharge Planning, Relapse Prevention   Education Outcome: Acknowledges understanding   Clinical Observations/Feedback: Patient actively participated in group session. Patient took lead role in group, often forgetting to include her team mate in the decision making. Patient additionally attempted to negotiate with LRT regarding answers she got incorrect. Patient shared that she has Fibromyalgia and that she often finds it difficult to participate in activities. LRT encouraged patient to try activities that require less physical activity, as well as stress management activities. Peer suggested an activity he participates in due to him having Fibromyalgia as well, patient seemed semi-receptive to suggestions, but it was apparent she is apprehensive about trying new activities.   Marykay Lex Jacier Gladu, LRT/CTRS  Elston Aldape L 06/07/2013 5:03 PM
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
## 2013-06-08 DIAGNOSIS — F1010 Alcohol abuse, uncomplicated: Secondary | ICD-10-CM

## 2013-06-08 MED ORDER — VALACYCLOVIR HCL 500 MG PO TABS: 500.0000 mg | ORAL_TABLET | Freq: Two times a day (BID) | ORAL | Status: DC | PRN

## 2013-06-08 MED ORDER — NONFORMULARY OR COMPOUNDED ITEM
1.0000 "application " | Freq: Every day | Status: DC
  Administered 2013-06-08 – 2013-06-09 (×2): 1 via TOPICAL

## 2013-06-08 MED ORDER — MELOXICAM 15 MG PO TABS
15.0000 mg | ORAL_TABLET | Freq: Every morning | ORAL | Status: DC
  Administered 2013-06-09: 15 mg via ORAL
  Filled 2013-06-08 (×3): qty 1

## 2013-06-08 MED ORDER — POLYETHYLENE GLYCOL 3350 17 G PO PACK
17.0000 g | PACK | Freq: Every day | ORAL | Status: DC | PRN
  Administered 2013-06-08: 17 g via ORAL
  Filled 2013-06-08: qty 1

## 2013-06-08 MED ORDER — NONFORMULARY OR COMPOUNDED ITEM: 1.0000 "application " | Freq: Every day | Status: DC

## 2013-06-08 MED ORDER — GLYCERIN (LAXATIVE) 2.1 G RE SUPP
1.0000 | Freq: Every day | RECTAL | Status: DC | PRN
  Administered 2013-06-08: 1 via RECTAL
  Filled 2013-06-08: qty 1

## 2013-06-08 MED ORDER — SERTRALINE HCL 50 MG PO TABS
50.0000 mg | ORAL_TABLET | Freq: Every day | ORAL | Status: DC
  Administered 2013-06-09: 50 mg via ORAL
  Filled 2013-06-08: qty 1
  Filled 2013-06-08: qty 7
  Filled 2013-06-08: qty 1

## 2013-06-08 NOTE — Progress Notes (Signed)
[facility] MD Progress Note  06/08/2013 1:37 PM Sarah Reeves  MRN:  [id number] Subjective:  Patient is seen today and reviewed chart. Patient has been depressed, anxious and been compliant with her medications without adverse effects. Patient has no stomach upset and hand shakes. She has been placed on librium protocol which she is tolerating well. She rated her depression as 5/10 and anxiety 3/10 and denied suicidal thoughts.   Diagnosis:  Axis I: Alcohol Abuse, Major Depression, Recurrent severe and Substance Induced Mood Disorder  ADL's:  Intact  Sleep: Fair  Appetite:  Fair  Suicidal Ideation:  denied Homicidal Ideation:  denied AEB (as evidenced by):  Psychiatric Specialty Exam: ROS  Blood pressure 109/76, pulse 88, temperature 98.2 F (36.8 C), temperature source Oral, resp. rate 16, height 5\' 3"  (1.6 m), weight 57.607 kg (127 lb).Body mass index is 22.5 kg/(m^2).  General Appearance: Casual, Fairly Groomed and Guarded  Patent attorney::  Fair  Speech:  Clear and Coherent  Volume:  Decreased  Mood:  Anxious and Depressed  Affect:  Constricted and Depressed  Thought Process:  Coherent and Goal Directed  Orientation:  Full (Time, Place, and Person)  Thought Content:  Rumination  Suicidal Thoughts:  No  Homicidal Thoughts:  No  Memory:  Immediate;   Fair  Judgement:  Intact  Insight:  Fair  Psychomotor Activity:  Psychomotor Retardation  Concentration:  Fair  Recall:  Fair  Akathisia:  NA  Handed:  Right  AIMS (if indicated):     Assets:  Communication Skills Desire for Improvement Financial Resources/Insurance Housing Physical Health Resilience Social Support Transportation  Sleep:  Number of Hours: 5.25   Current Medications: Current Facility-Administered Medications  Medication Dose Route Frequency Provider Last Rate Last Dose  . albuterol (PROVENTIL HFA;VENTOLIN HFA) 108 (90 BASE) MCG/ACT inhaler 1-2 puff  1-2 puff Inhalation Q4H PRN Shuvon Rankin, NP      .  alum & mag hydroxide-simeth (MAALOX/MYLANTA) 200-200-20 MG/5ML suspension 30 mL  30 mL Oral Q4H PRN Shuvon Rankin, NP      . chlordiazePOXIDE (LIBRIUM) capsule 25 mg  25 mg Oral Q6H PRN Shuvon Rankin, NP   25 mg at 06/07/13 0843  . chlordiazePOXIDE (LIBRIUM) capsule 25 mg  25 mg Oral TID Shuvon Rankin, NP   25 mg at 06/08/13 0816   Followed by  . [START ON 06/09/2013] chlordiazePOXIDE (LIBRIUM) capsule 25 mg  25 mg Oral BH-qamhs Shuvon Rankin, NP       Followed by  . [START ON 06/10/2013] chlordiazePOXIDE (LIBRIUM) capsule 25 mg  25 mg Oral Daily Shuvon Rankin, NP      . hydrocortisone (CORTEF) tablet 2.5 mg  2.5 mg Oral BID WC Shuvon Rankin, NP   2.5 mg at 06/08/13 0817  . hydrOXYzine (ATARAX/VISTARIL) tablet 25 mg  25 mg Oral Q6H PRN Shuvon Rankin, NP   25 mg at 06/07/13 0837  . ibuprofen (ADVIL,MOTRIN) tablet 600 mg  600 mg Oral Q6H PRN Court Joy, PA-C   600 mg at 06/07/13 1549  . loperamide (IMODIUM) capsule 2-4 mg  2-4 mg Oral PRN Shuvon Rankin, NP      . loratadine (CLARITIN) tablet 10 mg  10 mg Oral Daily Shuvon Rankin, NP   10 mg at 06/08/13 0817  . magnesium hydroxide (MILK OF MAGNESIA) suspension 30 mL  30 mL Oral Daily PRN Shuvon Rankin, NP   30 mL at 06/07/13 0933  . [START ON 06/09/2013] meloxicam (MOBIC) tablet 15 mg  15 mg Oral  q morning - 10a Nehemiah Settle, MD      . multivitamin with minerals tablet 1 tablet  1 tablet Oral Daily Shuvon Rankin, NP   1 tablet at 06/08/13 0816  . nicotine polacrilex (NICORETTE) gum 2 mg  2 mg Oral PRN Nehemiah Settle, MD   2 mg at 06/07/13 2101  . NONFORMULARY OR COMPOUNDED ITEM 1 application  1 application Topical Q breakfast Nehemiah Settle, MD   1 application at 06/08/13 0830  . ondansetron (ZOFRAN-ODT) disintegrating tablet 4 mg  4 mg Oral Q6H PRN Shuvon Rankin, NP      . polyethylene glycol (MIRALAX / GLYCOLAX) packet 17 g  17 g Oral Daily PRN Verne Spurr, PA-C   17 g at 06/08/13 1122  . [START ON 06/09/2013]  sertraline (ZOLOFT) tablet 50 mg  50 mg Oral Q breakfast Nehemiah Settle, MD      . thiamine (B-1) injection 100 mg  100 mg Intramuscular Once Shuvon Rankin, NP      . thiamine (VITAMIN B-1) tablet 100 mg  100 mg Oral Daily Shuvon Rankin, NP   100 mg at 06/08/13 0816  . valACYclovir (VALTREX) tablet 500 mg  500 mg Oral BID PRN Nehemiah Settle, MD        Lab Results: No results found for this or any previous visit (from the past 48 hour(s)).  Physical Findings: AIMS: Facial and Oral Movements Muscles of Facial Expression: None, normal Lips and Perioral Area: None, normal Jaw: None, normal Tongue: None, normal,Extremity Movements Upper (arms, wrists, hands, fingers): None, normal Lower (legs, knees, ankles, toes): None, normal, Trunk Movements Neck, shoulders, hips: None, normal, Overall Severity Severity of abnormal movements (highest score from questions above): None, normal Incapacitation due to abnormal movements: None, normal Patient's awareness of abnormal movements (rate only patient's report): No Awareness, Dental Status Current problems with teeth and/or dentures?: No Does patient usually wear dentures?: No  CIWA:  CIWA-Ar Total: 0 COWS:     Treatment Plan Summary: Daily contact with patient to assess and evaluate symptoms and progress in treatment Medication management  Plan: Continue Zoloft 50 mg Qam for depression Continue Librium treatment as per protocol and CIWA monitoring 1. Admit for crisis management and stabilization. 2. Medication management to reduce current symptoms to base line and improve the patient's overall level of functioning. 3. Treat health problems as indicated. 4. Develop treatment plan to decrease risk of relapse upon discharge and to reduce the need for readmission. 5. Psycho-social education regarding relapse prevention and self care. 6. Health care follow up as needed for medical problems. 7. Restart home medications where  appropriate. 8. Discharge plans in progress, LOS 1-2 days.    Medical Decision Making Problem Points:  Established problem, worsening (2), New problem, with no additional work-up planned (3), Review of last therapy session (1) and Review of psycho-social stressors (1) Data Points:  Review or order clinical lab tests (1) Review or order medicine tests (1) Review of medication regiment & side effects (2) Review of new medications or change in dosage (2)  I certify that inpatient services furnished can reasonably be expected to improve the patient's condition.   Nehemiah Settle., MD 06/08/2013, 1:37 PM

## 2013-06-08 NOTE — Progress Notes (Signed)
D:  Patient up and visible in the milieu today.  Has attended and participated in most groups today.  She denies depression, hopelessness, or suicidal thoughts.  She has complained of constipation and requested something stronger for her bowels.  States she has a history of having blockages in the past.   A:  Patient has been encouraged to push fluids and to get plenty of fruits and vegetables with her meals.  She was given Miralax this morning and a glycerine suppository this afternoon with no results as yet.   R:  Pleasant and cooperative with staff.  Interacting well with peers.  Participating in groups.  Remains constipated.  Safety is maintained on the unit.

## 2013-06-08 NOTE — Progress Notes (Signed)
The focus of this group is to educate the patient on the purpose and policies of crisis stabilization and provide a format to answer questions about their admission.  The group details unit policies and expectations of patients while admitted.  Patient attended 0900 nurse education orientation group.  Patient actively participated, appropriate affect, alert, appropriate insight and engagement.  Patient will work on goals for discharge.

## 2013-06-08 NOTE — Progress Notes (Signed)
Adult Psychoeducational Group Note  Date:  06/08/2013 Time:  2:34 PM  Group Topic/Focus:  Recovery Goals:   The focus of this group is to identify appropriate goals for recovery and establish a plan to achieve them.  Participation Level:  Did Not Attend   Sarah Reeves 06/08/2013, 2:34 PM
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
## 2013-06-08 NOTE — [facility] Suicide Risk Assessment (Signed)
[facility] INPATIENT:  Family/Significant Other Suicide Prevention Education  Suicide Prevention Education:  Education Completed; Sarah Reeves - husband [phone]),  (name of family member/significant other) has been identified by the patient as the family member/significant other with whom the patient will be residing, and identified as the person(s) who will aid the patient in the event of a mental health crisis (suicidal ideations/suicide attempt).  With written consent from the patient, the family member/significant other has been provided the following suicide prevention education, prior to the and/or following the discharge of the patient.  The suicide prevention education provided includes the following:  Suicide risk factors  Suicide prevention and interventions  National Suicide Hotline telephone number  [facility] assessment telephone number  [facility] Emergency Assistance 911  [facility] and/or Residential Mobile Crisis Unit telephone number  Request made of family/significant other to:  Remove weapons (e.g., guns, rifles, knives), all items previously/currently identified as safety concern.    Remove drugs/medications (over-the-counter, prescriptions, illicit drugs), all items previously/currently identified as a safety concern.  The family member/significant other verbalizes understanding of the suicide prevention education information provided.  The family member/significant other agrees to remove the items of safety concern listed above.  Sarah Reeves 06/08/2013, 11:22 AM

## 2013-06-08 NOTE — Progress Notes (Signed)
Adult Psychoeducational Group Note  Date:  06/08/2013 Time:  10:13 PM  Group Topic/Focus:  Goals Group:   The focus of this group is to help patients establish daily goals to achieve during treatment and discuss how the patient can incorporate goal setting into their daily lives to aide in recovery.  Participation Level:  Active  Participation Quality:  Appropriate  Affect:  Appropriate  Cognitive:  Appropriate  Insight: Appropriate  Engagement in Group:  Engaged  Modes of Intervention:  Discussion  Additional Comments:  Pt states that she wants to think about her goals and find appropriate ways to obtain them on a consistent basis.  Terie Purser R 06/08/2013, 10:13 PM

## 2013-06-08 NOTE — Progress Notes (Signed)
Recreation Therapy Notes  Date: 08.19.2014 Time: 2:45pm Location: [address] Dayroom  Group Topic: Animal Assisted Activities  Goal Area(s) Addresses:  Patient will interact appropriately with dog team.    Behavioral Response: Appropriate, Engaged  Education: Discharge Planning   Education Outcome: Acknowledges understanding   Clinical Observations/Feedback: Dog Team:  & handler. Patient interacted appropriately with dog team, LRT and group members.    [name] L Rossetta Kama, LRT/CTRS  Chesni Vos L 06/08/2013 4:43 PM 

## 2013-06-08 NOTE — [facility] Group Notes (Signed)
[facility] LCSW Group Therapy  Feelings Around Diagnosis 1:15 - 2:30             06/08/2013   3:13 PM     Type of Therapy:  Group Therapy  Participation Level:  Appropriate  Participation Quality:  Appropriate  Affect:  Appropriate  Cognitive:  Attentive Appropriate  Insight:  Engaged  Engagement in Therapy:  Engaged  Modes of Intervention:  Discussion Exploration Problem-Solving Supportive  Summary of Progress/Problems:  Patient shared having a diagnosis makes her feel stupid.  She shared she was always told she was stupid and not being able to do "normal" things like others makes her believe it.  Patient advised that what is normal for one person does not have to be the norm for others.  Wynn Banker 06/08/2013  3:13 PM

## 2013-06-09 MED ORDER — SERTRALINE HCL 50 MG PO TABS: 50.0000 mg | ORAL_TABLET | Freq: Every morning | ORAL | Status: DC

## 2013-06-09 MED ORDER — MELOXICAM 15 MG PO TABS: 15.0000 mg | ORAL_TABLET | Freq: Every morning | ORAL | Status: DC

## 2013-06-09 MED ORDER — CETIRIZINE HCL 10 MG PO TABS: 10.0000 mg | ORAL_TABLET | Freq: Every day | ORAL | Status: DC

## 2013-06-09 NOTE — Progress Notes (Signed)
Adult Psychoeducational Group Note  Date:  06/09/2013 Time:  11:00AM Group Topic/Focus:  Personal Choices and Values:   The focus of this group is to help patients assess and explore the importance of values in their lives, how their values affect their decisions, how they express their values and what opposes their expression.  Participation Level:  Active  Participation Quality:  Appropriate and Attentive  Affect:  Appropriate  Cognitive:  Alert and Appropriate  Insight: Appropriate  Engagement in Group:  Engaged  Modes of Intervention:  Discussion  Additional Comments:  Pt. Was attentive and appropriate during today's group discussion. Pt. Was able to complete value worksheet out of today's handbook. Pt. Stated that she need to have better time management and stop taking on so many task that lead to stress. Pt shared that she value her family however they tend to not understand her feelings. Pt shared with group how she plan on improving her communication with family, taking better self care, and trying to do things different.    Bing Plume D 06/09/2013, 11:42 AM

## 2013-06-09 NOTE — Progress Notes (Signed)
Pt d/c from hospital with her husband. All items returned. D/C instructions given, prescription given and sample given. Pt denies si and hi.

## 2013-06-09 NOTE — [facility] Suicide Risk Assessment (Signed)
Suicide Risk Assessment  Discharge Assessment     Demographic Factors:  Adolescent or young adult, Caucasian and Unemployed  Mental Status Per Nursing Assessment::   On Admission:  NA  Current Mental Status by Physician: Patient is calm and cooperative and pleasant. Patient has normal speech and thought process her stated mood is good and affect was bright and appropriate. Patient has denied season onset ideation disturbed plans. Patient has no evidence of psychotic symptoms.  Loss Factors: Financial problems/change in socioeconomic status  Historical Factors: Prior suicide attempts and Impulsivity  Risk Reduction Factors:   Sense of responsibility to family, Religious beliefs about death, Living with another person, especially a relative, Positive social support, Positive therapeutic relationship and Positive coping skills or problem solving skills  Continued Clinical Symptoms:  Severe Anxiety and/or Agitation Depression:   Recent sense of peace/wellbeing Alcohol/Substance Abuse/Dependencies Previous Psychiatric Diagnoses and Treatments  Cognitive Features That Contribute To Risk:  Polarized thinking    Suicide Risk:  Minimal: No identifiable suicidal ideation.  Patients presenting with no risk factors but with morbid ruminations; may be classified as minimal risk based on the severity of the depressive symptoms  Discharge Diagnoses:   AXIS I:  Alcohol Abuse, Major Depression, Recurrent severe and Substance Induced Mood Disorder AXIS II:  Deferred AXIS III:   Past Medical History  Diagnosis Date  . Asthma   . Hepatitis C   . Hyperlipemia   . Mental disorder   . Anxiety   . Depression    AXIS IV:  other psychosocial or environmental problems and problems related to social environment AXIS V:  51-60 moderate symptoms  Plan Of Care/Follow-up recommendations:  Activity:  As tolerated Diet:  Regular  Is patient on multiple antipsychotic therapies at discharge:  No    Has Patient had three or more failed trials of antipsychotic monotherapy by history:  No  Recommended Plan for Multiple Antipsychotic Therapies: Not applicable  Nehemiah Settle., M.D. 06/09/2013, 1:01 PM
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
## 2013-06-09 NOTE — [facility] Group Notes (Signed)
[facility] LCSW Aftercare Discharge Planning Group Note   06/09/2013 8:45 AM  Participation Quality:  Alert and Appropriate   Mood/Affect:  Appropriate and Calm  Depression Rating:  1  Anxiety Rating:  1  Thoughts of Suicide:  Pt denies SI/HI  Will you contract for safety?   Yes  Current AVH:  Pt denies  Plan for Discharge/Comments:  Pt attended discharge planning group and actively participated in group.  CSW provided pt with today's workbook.  Pt reports feeling stable to d/c today.  Pt states that she feels she's learned coping skills for her impulsivity and doesn't want to touch alcohol ever again.  Pt has follow up scheduled at [facility] for medication management and therapy.  No further needs voiced by pt at this time.    Transportation Means: Pt has access to transportation  Supports: No supports mentioned at this time  Reyes Ivan, LCSWA 06/09/2013 9:41 AM

## 2013-06-09 NOTE — Plan of Care (Signed)
Problem: Alteration in mood Goal: LTG-Patient reports reduction in suicidal thoughts (Patient reports reduction in suicidal thoughts and is able to verbalize a safety plan for whenever patient is feeling suicidal)  Outcome: Completed/Met Date Met:  06/09/13 Pt denies si and hi  Problem: Alteration in mood & ability to function due to Goal: LTG-Pt reports reduction in suicidal thoughts (Patient reports reduction in suicidal thoughts and is able to verbalize a safety plan for whenever patient is feeling suicidal)  Outcome: Completed/Met Date Met:  06/09/13 Pt denies si and hi

## 2013-06-09 NOTE — Progress Notes (Signed)
[facility] Adult Case Management Discharge Plan :  Will you be returning to the same living situation after discharge: Yes,  returning home At discharge, do you have transportation home?:Yes,  husband will pick pt up Do you have the ability to pay for your medications:Yes,  access to meds  Release of information consent forms completed and in the chart;  Patient's signature needed at discharge.  Patient to Follow up at: Follow-up Information   Follow up with [facility] On 06/11/2013. (Appointment scheduled at 11:00 am for intake assessment with Viviann [facility], for medication management and therapy)    Contact information:   [address] [place], [place] [address] Phone: [phone] Fax: [phone]      Patient denies SI/HI:   Yes,  denies SI/HI    Safety Planning and Suicide Prevention discussed:  Yes,  discussed with pt and pt's husband.  See suicide prevention education note.   Carmina Miller 06/09/2013, 10:53 AM

## 2013-06-09 NOTE — Progress Notes (Signed)
Writer observed patient up in the dayroom watching with minimal interaction with peers. Patient c/o still being constipated and was informed that she has taken all that she can receive for the day. Writer encouraged patient to take in plenty of fluids and when lying down this evening to lie on her left side. Patient reports being started on zoloft today and reports that she was hoping to start on a lower dosage first. Patient currently denies si/hi/a/v hallucinations. Safety maintained with 15 min checks, will continue to monitor.

## 2013-06-09 NOTE — Tx Team (Signed)
Interdisciplinary Treatment Plan Update (Adult)  Date: 06/09/2013  Time Reviewed:  9:45 AM  Progress in Treatment: Attending groups: Yes Participating in groups:  Yes Taking medication as prescribed:  Yes Tolerating medication:  Yes Family/Significant othe contact made: Yes Patient understands diagnosis:  Yes Discussing patient identified problems/goals with staff:  Yes Medical problems stabilized or resolved:  Yes Denies suicidal/homicidal ideation: Yes Issues/concerns per patient self-inventory:  Yes Other:  New problem(s) identified: N/A  Discharge Plan or Barriers: Pt will follow up at [facility] for medication management and therapy  Reason for Continuation of Hospitalization: Stable to d/c  Comments: N/A  Estimated length of stay: D/C today  For review of initial/current patient goals, please see plan of care.  Attendees: Patient:  Sarah Reeves 06/09/2013 10:25 AM   Family:     Physician:  Dr. Javier Glazier 06/09/2013 10:23 AM   Nursing:   Quintella Reichert, RN 06/09/2013 10:23 AM   Clinical Social Worker:  Reyes Ivan, LCSWA 06/09/2013 10:23 AM   Other: Verne Spurr, PA 06/09/2013 10:23 AM   Other:  Frankey Shown, MA care coordination 06/09/2013 10:23 AM   Other:  Juline Patch, LCSW 06/09/2013 10:23 AM   Other:  Waynetta Sandy, RN 06/09/2013 10:24 AM   Other:    Other:    Other:    Other:    Other:    Other:     Scribe for Treatment Team:   Carmina Miller, 06/09/2013 10:23 AM
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
## 2013-06-14 NOTE — Progress Notes (Signed)
Patient Discharge Instructions:  After Visit Summary (AVS):   Faxed to:  06/14/13 Psychiatric Admission Assessment Note:   Faxed to:  06/14/13 Suicide Risk Assessment - Discharge Assessment:   Faxed to:  06/14/13 Faxed/Sent to the [facility] Care provider:  06/14/13 Faxed to [facility] @ [phone]  Jerelene Redden, 06/14/2013, 4:18 PM

## 2013-06-16 MED ORDER — CETIRIZINE HCL 10 MG PO TABS: 10.0000 mg | ORAL_TABLET | Freq: Every day | ORAL | Status: DC

## 2013-06-16 MED ORDER — HYDROCORTISONE 5 MG PO TABS: 5.0000 mg | ORAL_TABLET | Freq: Every day | ORAL | Status: DC

## 2013-06-16 MED ORDER — MELOXICAM 15 MG PO TABS: 15.0000 mg | ORAL_TABLET | Freq: Every morning | ORAL | Status: DC

## 2013-06-16 MED ORDER — VALACYCLOVIR HCL 500 MG PO TABS: 500.0000 mg | ORAL_TABLET | Freq: Two times a day (BID) | ORAL | Status: DC

## 2013-06-16 MED ORDER — SERTRALINE HCL 50 MG PO TABS: 50.0000 mg | ORAL_TABLET | Freq: Every morning | ORAL | Status: DC

## 2013-08-23 ENCOUNTER — Other Ambulatory Visit: Payer: Self-pay | Admitting: Internal Medicine

## 2013-08-23 DIAGNOSIS — C220 Liver cell carcinoma: Secondary | ICD-10-CM

## 2013-08-26 ENCOUNTER — Ambulatory Visit
Admission: RE | Admit: 2013-08-26 | Discharge: 2013-08-26 | Disposition: A | Discharge status: Home / Self Care | Payer: 59 | Source: Ambulatory Visit | Attending: Internal Medicine | Admitting: Internal Medicine

## 2013-08-26 DIAGNOSIS — C220 Liver cell carcinoma: Secondary | ICD-10-CM

## 2013-09-14 ENCOUNTER — Other Ambulatory Visit: Payer: Self-pay | Admitting: Obstetrics and Gynecology

## 2013-09-23 ENCOUNTER — Other Ambulatory Visit: Payer: Self-pay | Admitting: Obstetrics and Gynecology

## 2013-09-23 DIAGNOSIS — R928 Other abnormal and inconclusive findings on diagnostic imaging of breast: Secondary | ICD-10-CM

## 2013-09-27 ENCOUNTER — Ambulatory Visit
Admission: RE | Admit: 2013-09-27 | Discharge: 2013-09-27 | Disposition: A | Discharge status: Home / Self Care | Payer: 59 | Source: Ambulatory Visit | Attending: Obstetrics and Gynecology | Admitting: Obstetrics and Gynecology

## 2013-09-27 DIAGNOSIS — R928 Other abnormal and inconclusive findings on diagnostic imaging of breast: Secondary | ICD-10-CM

## 2013-09-28 ENCOUNTER — Other Ambulatory Visit: Payer: Self-pay | Admitting: Obstetrics and Gynecology

## 2013-09-28 DIAGNOSIS — R9230 Dense breasts, unspecified: Secondary | ICD-10-CM

## 2013-09-28 DIAGNOSIS — R922 Inconclusive mammogram: Secondary | ICD-10-CM

## 2013-10-08 ENCOUNTER — Other Ambulatory Visit: Payer: Self-pay

## 2013-10-17 ENCOUNTER — Ambulatory Visit
Admission: RE | Admit: 2013-10-17 | Discharge: 2013-10-17 | Disposition: A | Discharge status: Home / Self Care | Payer: 59 | Source: Ambulatory Visit | Attending: Obstetrics and Gynecology | Admitting: Obstetrics and Gynecology

## 2013-10-17 DIAGNOSIS — R922 Inconclusive mammogram: Secondary | ICD-10-CM

## 2013-10-17 DIAGNOSIS — R9230 Dense breasts, unspecified: Secondary | ICD-10-CM

## 2013-10-17 MED ORDER — GADOBENATE DIMEGLUMINE 529 MG/ML IV SOLN
12.0000 mL | Freq: Once | INTRAVENOUS | Status: AC | PRN
  Administered 2013-10-17: 12 mL via INTRAVENOUS

## 2014-08-01 ENCOUNTER — Other Ambulatory Visit: Payer: Self-pay | Admitting: Internal Medicine

## 2014-08-01 DIAGNOSIS — B1920 Unspecified viral hepatitis C without hepatic coma: Secondary | ICD-10-CM

## 2014-08-01 DIAGNOSIS — R0989 Other specified symptoms and signs involving the circulatory and respiratory systems: Secondary | ICD-10-CM

## 2014-08-09 ENCOUNTER — Ambulatory Visit
Admission: RE | Admit: 2014-08-09 | Discharge: 2014-08-09 | Disposition: A | Discharge status: Home / Self Care | Payer: 59 | Source: Ambulatory Visit | Attending: Internal Medicine | Admitting: Internal Medicine

## 2014-08-09 DIAGNOSIS — B1920 Unspecified viral hepatitis C without hepatic coma: Secondary | ICD-10-CM

## 2014-08-09 DIAGNOSIS — R0989 Other specified symptoms and signs involving the circulatory and respiratory systems: Secondary | ICD-10-CM

## 2014-08-10 ENCOUNTER — Ambulatory Visit: Payer: 59 | Admitting: Neurology

## 2014-09-19 ENCOUNTER — Other Ambulatory Visit: Payer: Self-pay | Admitting: Obstetrics and Gynecology

## 2014-09-20 LAB — CYTOLOGY - PAP

## 2014-09-22 ENCOUNTER — Other Ambulatory Visit: Payer: Self-pay | Admitting: Obstetrics and Gynecology

## 2014-09-22 DIAGNOSIS — R928 Other abnormal and inconclusive findings on diagnostic imaging of breast: Secondary | ICD-10-CM

## 2014-10-05 ENCOUNTER — Encounter (INDEPENDENT_AMBULATORY_CARE_PROVIDER_SITE_OTHER): Payer: Self-pay

## 2014-10-05 ENCOUNTER — Ambulatory Visit
Admission: RE | Admit: 2014-10-05 | Discharge: 2014-10-05 | Disposition: A | Discharge status: Home / Self Care | Payer: 59 | Source: Ambulatory Visit | Attending: Obstetrics and Gynecology | Admitting: Obstetrics and Gynecology

## 2014-10-05 DIAGNOSIS — R928 Other abnormal and inconclusive findings on diagnostic imaging of breast: Secondary | ICD-10-CM

## 2014-10-12 ENCOUNTER — Other Ambulatory Visit: Payer: Self-pay | Admitting: Obstetrics and Gynecology

## 2014-10-12 DIAGNOSIS — Z9189 Other specified personal risk factors, not elsewhere classified: Secondary | ICD-10-CM

## 2014-10-12 DIAGNOSIS — Z9882 Breast implant status: Secondary | ICD-10-CM

## 2014-10-12 DIAGNOSIS — Z803 Family history of malignant neoplasm of breast: Secondary | ICD-10-CM

## 2014-10-28 ENCOUNTER — Ambulatory Visit
Admission: RE | Admit: 2014-10-28 | Discharge: 2014-10-28 | Disposition: A | Discharge status: Home / Self Care | Payer: 59 | Source: Ambulatory Visit | Attending: Obstetrics and Gynecology | Admitting: Obstetrics and Gynecology

## 2014-10-28 DIAGNOSIS — Z9882 Breast implant status: Secondary | ICD-10-CM

## 2014-10-28 DIAGNOSIS — Z9189 Other specified personal risk factors, not elsewhere classified: Secondary | ICD-10-CM

## 2014-10-28 DIAGNOSIS — Z803 Family history of malignant neoplasm of breast: Secondary | ICD-10-CM

## 2014-10-28 MED ORDER — GADOBENATE DIMEGLUMINE 529 MG/ML IV SOLN
12.0000 mL | Freq: Once | INTRAVENOUS | Status: AC | PRN
  Administered 2014-10-28: 12 mL via INTRAVENOUS

## 2014-10-31 ENCOUNTER — Other Ambulatory Visit: Payer: Self-pay | Admitting: Obstetrics and Gynecology

## 2014-10-31 DIAGNOSIS — R928 Other abnormal and inconclusive findings on diagnostic imaging of breast: Secondary | ICD-10-CM

## 2014-11-01 ENCOUNTER — Other Ambulatory Visit: Payer: Self-pay | Admitting: Obstetrics and Gynecology

## 2014-11-01 DIAGNOSIS — R928 Other abnormal and inconclusive findings on diagnostic imaging of breast: Secondary | ICD-10-CM

## 2014-11-03 ENCOUNTER — Ambulatory Visit
Admission: RE | Admit: 2014-11-03 | Discharge: 2014-11-03 | Disposition: A | Discharge status: Home / Self Care | Payer: 59 | Source: Ambulatory Visit | Attending: Obstetrics and Gynecology | Admitting: Obstetrics and Gynecology

## 2014-11-03 DIAGNOSIS — R928 Other abnormal and inconclusive findings on diagnostic imaging of breast: Secondary | ICD-10-CM

## 2014-11-10 ENCOUNTER — Other Ambulatory Visit: Payer: Self-pay | Admitting: Obstetrics and Gynecology

## 2014-11-10 DIAGNOSIS — R928 Other abnormal and inconclusive findings on diagnostic imaging of breast: Secondary | ICD-10-CM

## 2014-11-21 HISTORY — PX: BREAST BIOPSY: SHX20

## 2014-11-24 ENCOUNTER — Ambulatory Visit
Admission: RE | Admit: 2014-11-24 | Discharge: 2014-11-24 | Disposition: A | Discharge status: Home / Self Care | Payer: 59 | Source: Ambulatory Visit | Attending: Obstetrics and Gynecology | Admitting: Obstetrics and Gynecology

## 2014-11-24 ENCOUNTER — Other Ambulatory Visit: Payer: Self-pay | Admitting: Obstetrics and Gynecology

## 2014-11-24 DIAGNOSIS — R928 Other abnormal and inconclusive findings on diagnostic imaging of breast: Secondary | ICD-10-CM

## 2014-11-24 MED ORDER — GADOBENATE DIMEGLUMINE 529 MG/ML IV SOLN
12.0000 mL | Freq: Once | INTRAVENOUS | Status: AC | PRN
  Administered 2014-11-24: 12 mL via INTRAVENOUS

## 2014-12-09 ENCOUNTER — Other Ambulatory Visit (INDEPENDENT_AMBULATORY_CARE_PROVIDER_SITE_OTHER): Payer: Self-pay | Admitting: General Surgery

## 2014-12-09 DIAGNOSIS — N6320 Unspecified lump in the left breast, unspecified quadrant: Secondary | ICD-10-CM

## 2014-12-09 MED ORDER — CHLORHEXIDINE GLUCONATE 4 % EX LIQD: 1.0000 "application " | Freq: Once | CUTANEOUS | Status: AC

## 2014-12-09 MED ORDER — DEXTROSE 5 % IV SOLN: 2.0000 g | INTRAVENOUS | Status: AC

## 2014-12-14 ENCOUNTER — Other Ambulatory Visit (INDEPENDENT_AMBULATORY_CARE_PROVIDER_SITE_OTHER): Payer: Self-pay | Admitting: General Surgery

## 2014-12-14 DIAGNOSIS — N6320 Unspecified lump in the left breast, unspecified quadrant: Secondary | ICD-10-CM

## 2014-12-19 ENCOUNTER — Encounter (HOSPITAL_BASED_OUTPATIENT_CLINIC_OR_DEPARTMENT_OTHER): Payer: Self-pay | Admitting: *Deleted

## 2014-12-19 NOTE — Progress Notes (Signed)
No labs per anesthesia needed-called for dr Marlou Starks Rozann Lesches pt needs lab-can do after seeds 3/4

## 2014-12-20 ENCOUNTER — Other Ambulatory Visit (INDEPENDENT_AMBULATORY_CARE_PROVIDER_SITE_OTHER): Payer: Self-pay | Admitting: General Surgery

## 2014-12-20 DIAGNOSIS — N6320 Unspecified lump in the left breast, unspecified quadrant: Secondary | ICD-10-CM

## 2014-12-20 HISTORY — PX: BREAST BIOPSY: SHX20

## 2014-12-23 ENCOUNTER — Ambulatory Visit
Admission: RE | Admit: 2014-12-23 | Discharge: 2014-12-23 | Disposition: A | Discharge status: Home / Self Care | Payer: 59 | Source: Ambulatory Visit | Attending: General Surgery | Admitting: General Surgery

## 2014-12-23 DIAGNOSIS — N6320 Unspecified lump in the left breast, unspecified quadrant: Secondary | ICD-10-CM

## 2014-12-26 ENCOUNTER — Ambulatory Visit (HOSPITAL_BASED_OUTPATIENT_CLINIC_OR_DEPARTMENT_OTHER): Payer: 59 | Admitting: Anesthesiology

## 2014-12-26 ENCOUNTER — Encounter (HOSPITAL_BASED_OUTPATIENT_CLINIC_OR_DEPARTMENT_OTHER): Payer: Self-pay | Admitting: Certified Registered"

## 2014-12-26 ENCOUNTER — Ambulatory Visit (HOSPITAL_BASED_OUTPATIENT_CLINIC_OR_DEPARTMENT_OTHER)
Admission: RE | Admit: 2014-12-26 | Discharge: 2014-12-26 | Disposition: A | Discharge status: Home / Self Care | Payer: 59 | Source: Ambulatory Visit | Attending: General Surgery | Admitting: General Surgery

## 2014-12-26 ENCOUNTER — Ambulatory Visit
Admission: RE | Admit: 2014-12-26 | Discharge: 2014-12-26 | Disposition: A | Discharge status: Home / Self Care | Payer: 59 | Source: Ambulatory Visit | Attending: General Surgery | Admitting: General Surgery

## 2014-12-26 ENCOUNTER — Encounter (HOSPITAL_BASED_OUTPATIENT_CLINIC_OR_DEPARTMENT_OTHER)
Admission: RE | Disposition: A | Discharge status: Home / Self Care | Payer: Self-pay | Source: Ambulatory Visit | Attending: General Surgery

## 2014-12-26 DIAGNOSIS — F419 Anxiety disorder, unspecified: Secondary | ICD-10-CM | POA: Insufficient documentation

## 2014-12-26 DIAGNOSIS — N6320 Unspecified lump in the left breast, unspecified quadrant: Secondary | ICD-10-CM

## 2014-12-26 DIAGNOSIS — K219 Gastro-esophageal reflux disease without esophagitis: Secondary | ICD-10-CM | POA: Diagnosis not present

## 2014-12-26 DIAGNOSIS — N63 Unspecified lump in breast: Secondary | ICD-10-CM | POA: Diagnosis present

## 2014-12-26 DIAGNOSIS — F17200 Nicotine dependence, unspecified, uncomplicated: Secondary | ICD-10-CM | POA: Diagnosis not present

## 2014-12-26 DIAGNOSIS — B1920 Unspecified viral hepatitis C without hepatic coma: Secondary | ICD-10-CM | POA: Insufficient documentation

## 2014-12-26 DIAGNOSIS — Z9889 Other specified postprocedural states: Secondary | ICD-10-CM | POA: Insufficient documentation

## 2014-12-26 DIAGNOSIS — N6012 Diffuse cystic mastopathy of left breast: Secondary | ICD-10-CM | POA: Insufficient documentation

## 2014-12-26 DIAGNOSIS — N641 Fat necrosis of breast: Secondary | ICD-10-CM | POA: Diagnosis not present

## 2014-12-26 DIAGNOSIS — G43909 Migraine, unspecified, not intractable, without status migrainosus: Secondary | ICD-10-CM | POA: Insufficient documentation

## 2014-12-26 DIAGNOSIS — Z79891 Long term (current) use of opiate analgesic: Secondary | ICD-10-CM | POA: Insufficient documentation

## 2014-12-26 DIAGNOSIS — Z7951 Long term (current) use of inhaled steroids: Secondary | ICD-10-CM | POA: Diagnosis not present

## 2014-12-26 DIAGNOSIS — J45909 Unspecified asthma, uncomplicated: Secondary | ICD-10-CM | POA: Diagnosis not present

## 2014-12-26 DIAGNOSIS — M797 Fibromyalgia: Secondary | ICD-10-CM | POA: Diagnosis not present

## 2014-12-26 HISTORY — DX: Adverse effect of unspecified anesthetic, initial encounter: T41.45XA

## 2014-12-26 HISTORY — DX: Fibromyalgia: M79.7

## 2014-12-26 HISTORY — PX: BREAST LUMPECTOMY WITH RADIOACTIVE SEED LOCALIZATION: SHX6424

## 2014-12-26 HISTORY — DX: Nausea with vomiting, unspecified: R11.2

## 2014-12-26 HISTORY — DX: Other complications of anesthesia, initial encounter: T88.59XA

## 2014-12-26 HISTORY — DX: Gastro-esophageal reflux disease without esophagitis: K21.9

## 2014-12-26 HISTORY — DX: Other specified postprocedural states: Z98.890

## 2014-12-26 LAB — POCT HEMOGLOBIN-HEMACUE: Hemoglobin: 15.2 g/dL — ABNORMAL HIGH (ref 12.0–15.0)

## 2014-12-26 SURGERY — BREAST LUMPECTOMY WITH RADIOACTIVE SEED LOCALIZATION
Anesthesia: General | Site: Breast | Laterality: Left

## 2014-12-26 MED ORDER — PROPOFOL 10 MG/ML IV BOLUS
INTRAVENOUS | Status: DC | PRN
  Administered 2014-12-26: 200 mg via INTRAVENOUS

## 2014-12-26 MED ORDER — BUPIVACAINE HCL (PF) 0.25 % IJ SOLN
INTRAMUSCULAR | Status: DC | PRN
  Administered 2014-12-26: 10 mL

## 2014-12-26 MED ORDER — FENTANYL CITRATE 0.05 MG/ML IJ SOLN
INTRAMUSCULAR | Status: DC | PRN
  Administered 2014-12-26: 50 ug via INTRAVENOUS
  Administered 2014-12-26: 25 ug via INTRAVENOUS

## 2014-12-26 MED ORDER — CHLORHEXIDINE GLUCONATE 4 % EX LIQD: 1.0000 "application " | Freq: Once | CUTANEOUS | Status: DC

## 2014-12-26 MED ORDER — HYDROMORPHONE HCL 1 MG/ML IJ SOLN
INTRAMUSCULAR | Status: AC
  Filled 2014-12-26: qty 1

## 2014-12-26 MED ORDER — MIDAZOLAM HCL 5 MG/5ML IJ SOLN
INTRAMUSCULAR | Status: DC | PRN
  Administered 2014-12-26: 2 mg via INTRAVENOUS

## 2014-12-26 MED ORDER — MEPERIDINE HCL 25 MG/ML IJ SOLN: 6.2500 mg | INTRAMUSCULAR | Status: DC | PRN

## 2014-12-26 MED ORDER — DEXAMETHASONE SODIUM PHOSPHATE 4 MG/ML IJ SOLN
INTRAMUSCULAR | Status: DC | PRN
  Administered 2014-12-26: 10 mg via INTRAVENOUS

## 2014-12-26 MED ORDER — FENTANYL CITRATE 0.05 MG/ML IJ SOLN: 50.0000 ug | INTRAMUSCULAR | Status: DC | PRN

## 2014-12-26 MED ORDER — SCOPOLAMINE 1 MG/3DAYS TD PT72
MEDICATED_PATCH | TRANSDERMAL | Status: AC
  Filled 2014-12-26: qty 1

## 2014-12-26 MED ORDER — ONDANSETRON HCL 4 MG/2ML IJ SOLN
INTRAMUSCULAR | Status: DC | PRN
  Administered 2014-12-26: 4 mg via INTRAVENOUS

## 2014-12-26 MED ORDER — PROMETHAZINE HCL 25 MG/ML IJ SOLN: 6.2500 mg | INTRAMUSCULAR | Status: DC | PRN

## 2014-12-26 MED ORDER — HYDROMORPHONE HCL 1 MG/ML IJ SOLN
0.2500 mg | INTRAMUSCULAR | Status: DC | PRN
  Administered 2014-12-26 (×3): 0.25 mg via INTRAVENOUS

## 2014-12-26 MED ORDER — FENTANYL CITRATE 0.05 MG/ML IJ SOLN
INTRAMUSCULAR | Status: AC
  Filled 2014-12-26: qty 6

## 2014-12-26 MED ORDER — MIDAZOLAM HCL 2 MG/2ML IJ SOLN
INTRAMUSCULAR | Status: AC
  Filled 2014-12-26: qty 2

## 2014-12-26 MED ORDER — CEFAZOLIN SODIUM-DEXTROSE 2-3 GM-% IV SOLR
INTRAVENOUS | Status: AC
  Filled 2014-12-26: qty 50

## 2014-12-26 MED ORDER — OXYCODONE HCL 5 MG PO TABS
ORAL_TABLET | ORAL | Status: AC
  Filled 2014-12-26: qty 1

## 2014-12-26 MED ORDER — LIDOCAINE HCL (CARDIAC) 20 MG/ML IV SOLN
INTRAVENOUS | Status: DC | PRN
  Administered 2014-12-26: 60 mg via INTRAVENOUS

## 2014-12-26 MED ORDER — CHLORHEXIDINE GLUCONATE 4 % EX LIQD
1.0000 | Freq: Once | CUTANEOUS | Status: DC
Start: 2014-12-27 — End: 2014-12-26

## 2014-12-26 MED ORDER — MIDAZOLAM HCL 2 MG/2ML IJ SOLN: 1.0000 mg | INTRAMUSCULAR | Status: DC | PRN

## 2014-12-26 MED ORDER — OXYCODONE HCL 5 MG/5ML PO SOLN: 5.0000 mg | Freq: Once | ORAL | Status: AC | PRN

## 2014-12-26 MED ORDER — OXYCODONE-ACETAMINOPHEN 5-325 MG PO TABS: 1.0000 | ORAL_TABLET | ORAL | Status: DC | PRN

## 2014-12-26 MED ORDER — OXYCODONE HCL 5 MG PO TABS
5.0000 mg | ORAL_TABLET | Freq: Once | ORAL | Status: AC | PRN
  Administered 2014-12-26: 5 mg via ORAL

## 2014-12-26 MED ORDER — CEFAZOLIN SODIUM-DEXTROSE 2-3 GM-% IV SOLR
2.0000 g | INTRAVENOUS | Status: AC
  Administered 2014-12-26: 2 g via INTRAVENOUS

## 2014-12-26 MED ORDER — LACTATED RINGERS IV SOLN
INTRAVENOUS | Status: DC
Start: 2014-12-26 — End: 2014-12-26
  Administered 2014-12-26 (×2): via INTRAVENOUS

## 2014-12-26 SURGICAL SUPPLY — 40 items
APPLIER CLIP 9.375 MED OPEN (MISCELLANEOUS)
BLADE SURG 15 STRL LF DISP TIS (BLADE) ×1 IMPLANT
BLADE SURG 15 STRL SS (BLADE) ×1
CANISTER SUC SOCK COL 7IN (MISCELLANEOUS) IMPLANT
CANISTER SUCT 1200ML W/VALVE (MISCELLANEOUS) IMPLANT
CHLORAPREP W/TINT 26ML (MISCELLANEOUS) ×2 IMPLANT
CLIP APPLIE 9.375 MED OPEN (MISCELLANEOUS) IMPLANT
COVER BACK TABLE 60X90IN (DRAPES) ×2 IMPLANT
COVER MAYO STAND STRL (DRAPES) ×2 IMPLANT
COVER PROBE W GEL 5X96 (DRAPES) ×2 IMPLANT
DECANTER SPIKE VIAL GLASS SM (MISCELLANEOUS) IMPLANT
DEVICE DUBIN W/COMP PLATE 8390 (MISCELLANEOUS) ×2 IMPLANT
DRAPE LAPAROSCOPIC ABDOMINAL (DRAPES) ×2 IMPLANT
DRAPE UTILITY XL STRL (DRAPES) ×2 IMPLANT
ELECT COATED BLADE 2.86 ST (ELECTRODE) ×2 IMPLANT
ELECT REM PT RETURN 9FT ADLT (ELECTROSURGICAL) ×2
ELECTRODE REM PT RTRN 9FT ADLT (ELECTROSURGICAL) ×1 IMPLANT
GLOVE BIO SURGEON STRL SZ7.5 (GLOVE) IMPLANT
GLOVE BIOGEL PI IND STRL 7.5 (GLOVE) ×1 IMPLANT
GLOVE BIOGEL PI INDICATOR 7.5 (GLOVE) ×1
GLOVE EXAM NITRILE EXT CUFF MD (GLOVE) ×2 IMPLANT
GLOVE SURG SS PI 7.5 STRL IVOR (GLOVE) ×4 IMPLANT
GOWN STRL REUS W/ TWL LRG LVL3 (GOWN DISPOSABLE) ×2 IMPLANT
GOWN STRL REUS W/TWL LRG LVL3 (GOWN DISPOSABLE) ×2
KIT MARKER MARGIN INK (KITS) IMPLANT
LIQUID BAND (GAUZE/BANDAGES/DRESSINGS) ×2 IMPLANT
NEEDLE HYPO 25X1 1.5 SAFETY (NEEDLE) ×2 IMPLANT
NS IRRIG 1000ML POUR BTL (IV SOLUTION) IMPLANT
PACK BASIN DAY SURGERY FS (CUSTOM PROCEDURE TRAY) ×2 IMPLANT
PENCIL BUTTON HOLSTER BLD 10FT (ELECTRODE) ×2 IMPLANT
SLEEVE SCD COMPRESS KNEE MED (MISCELLANEOUS) ×2 IMPLANT
SPONGE LAP 18X18 X RAY DECT (DISPOSABLE) ×2 IMPLANT
SUT MON AB 4-0 PC3 18 (SUTURE) ×2 IMPLANT
SUT SILK 2 0 SH (SUTURE) ×2 IMPLANT
SUT VICRYL 3-0 CR8 SH (SUTURE) ×2 IMPLANT
SYR CONTROL 10ML LL (SYRINGE) ×2 IMPLANT
TOWEL OR 17X24 6PK STRL BLUE (TOWEL DISPOSABLE) ×2 IMPLANT
TOWEL OR NON WOVEN STRL DISP B (DISPOSABLE) ×2 IMPLANT
TUBE CONNECTING 20X1/4 (TUBING) IMPLANT
YANKAUER SUCT BULB TIP NO VENT (SUCTIONS) IMPLANT

## 2014-12-26 NOTE — Interval H&P Note (Signed)
History and Physical Interval Note:  12/26/2014 7:24 AM  Sarah Reeves  has presented today for surgery, with the diagnosis of Left Sided Breast Mass  The various methods of treatment have been discussed with the patient and family. After consideration of risks, benefits and other options for treatment, the patient has consented to  Procedure(s): LEFT BREAST LUMPECTOMY WITH RADIOACTIVE SEED LOCALIZATION (Left) as a surgical intervention .  The patient's history has been reviewed, patient examined, no change in status, stable for surgery.  I have reviewed the patient's chart and labs.  Questions were answered to the patient's satisfaction.     TOTH III,Ilissa Rosner S

## 2014-12-26 NOTE — Addendum Note (Signed)
Addendum  created 12/26/14 1301 by Nolon Nations, MD   Modules edited: Notes Section   Notes Section:  File: [id number]

## 2014-12-26 NOTE — Anesthesia Preprocedure Evaluation (Addendum)
Anesthesia Evaluation  Patient identified by MRN, date of birth, ID band Patient awake    Reviewed: Allergy & Precautions, NPO status , Patient's Chart, lab work & pertinent test results  History of Anesthesia Complications (+) PONV and history of anesthetic complications  Airway Mallampati: II  TM Distance: >3 FB Neck ROM: Full    Dental no notable dental hx.    Pulmonary asthma , Current Smoker,  breath sounds clear to auscultation  Pulmonary exam normal       Cardiovascular negative cardio ROS  Rhythm:Regular Rate:Normal     Neuro/Psych PSYCHIATRIC DISORDERS Anxiety Depression negative neurological ROS     GI/Hepatic GERD-  ,(+) Hepatitis -, C  Endo/Other  negative endocrine ROS  Renal/GU negative Renal ROS     Musculoskeletal  (+) Fibromyalgia -  Abdominal   Peds  (+) Delivery details - Hematology negative hematology ROS (+)   Anesthesia Other Findings   Reproductive/Obstetrics negative OB ROS                            Anesthesia Physical Anesthesia Plan  ASA: II  Anesthesia Plan: General   Post-op Pain Management:    Induction: Intravenous  Airway Management Planned: LMA  Additional Equipment: None  Intra-op Plan:   Post-operative Plan: Extubation in OR  Informed Consent: I have reviewed the patients History and Physical, chart, labs and discussed the procedure including the risks, benefits and alternatives for the proposed anesthesia with the patient or authorized representative who has indicated his/her understanding and acceptance.   Dental advisory given  Plan Discussed with: CRNA  Anesthesia Plan Comments:         Anesthesia Quick Evaluation

## 2014-12-26 NOTE — Transfer of Care (Signed)
Immediate Anesthesia Transfer of Care Note  Patient: Sarah Reeves  Procedure(s) Performed: Procedure(s): LEFT BREAST LUMPECTOMY WITH RADIOACTIVE SEED LOCALIZATION (Left)  Patient Location: PACU  Anesthesia Type:General  Level of Consciousness: awake and patient cooperative  Airway & Oxygen Therapy: Patient Spontanous Breathing and Patient connected to face mask oxygen  Post-op Assessment: Report given to RN and Post -op Vital signs reviewed and stable  Post vital signs: Reviewed and stable  Last Vitals:  Filed Vitals:   12/26/14 1052  BP:   Pulse: 68  Temp:   Resp: 19    Complications: No apparent anesthesia complications

## 2014-12-26 NOTE — H&P (Signed)
Sarah Reeves 12/09/2014 3:15 PM Location: [facility] Patient #: [id number] DOB: [birth year] Married / Language: Cleophus Molt / Race: White Female  History of Present Illness Sammuel Hines. Marlou Starks MD; 12/09/2014 5:02 PM) Patient words: breast follow up.  The patient is a 54 year old female who presents with a complaint of Breast problems. We are asked to see the patient in consultation by Dr. Shon Hale to evaluate her for an abnormality in her lower inner left breast. The patient is a 54 year old white female who recently went for a routine screening mammogram. At that time she was found to have an abnormal-appearing mass in the lower inner left breast. This was also evaluated by MRI. An attempt was made to biopsy this which was unsuccessful. They recommendation now is for an open biopsy of this area for diagnosis. She does have a history of breast reduction bilaterally many years ago followed by a breast augmentation 8 years ago by Dr. Stephanie Coup   Other Problems Elbert Ewings, CMA; 12/09/2014 3:15 PM) Anxiety Disorder Back Pain Depression Gastroesophageal Reflux Disease General anesthesia - complications Hemorrhoids Hepatitis Lump In Breast Migraine Headache  Past Surgical History Elbert Ewings, CMA; 12/09/2014 3:15 PM) Breast Augmentation Bilateral. Breast Reconstruction Bilateral. Foot Surgery Right. Oral Surgery  Diagnostic Studies History Elbert Ewings, [place]; 12/09/2014 3:15 PM) Colonoscopy 1-5 years ago Mammogram within last year Pap Smear 1-5 years ago  Allergies Elbert Ewings, CMA; 12/09/2014 3:18 PM) No Known Drug Allergies02/19/2016  Medication History Elbert Ewings, CMA; 12/09/2014 3:23 PM) Imitrex (100MG  Tablet, Oral as needed) Active. Valtrex (500MG  Tablet, Oral as needed) Active. Ativan (1MG  Tablet, Oral) Active. Ambien (10MG  Tablet, Oral) Active. OxyCODONE HCl (5MG  Capsule, Oral) Active. ZyrTEC Allergy (10MG  Capsule, Oral) Active. Fluticasone  Propionate (50MCG/ACT Suspension, Nasal) Active. Calcium Pantothenate (100MG  Tablet, Oral) Active. Vitamin D3 (50000UNIT Capsule, Oral) Active. Vitamin C (1000MG  Tablet, Oral) Active. Magnesium Aspartate HCl (615MG  Tablet DR, Oral) Active. Vitamin B 12 (250MCG Lozenge, Oral) Active.  Social History Elbert Ewings, [place]; 12/09/2014 3:15 PM) Alcohol use Recently quit alcohol use. Caffeine use Coffee. No drug use Tobacco use Current every day smoker.  Family History Elbert Ewings, [place]; 12/09/2014 3:15 PM) Alcohol Abuse Family Members In General, Father, Mother. Arthritis Mother. Breast Cancer Family Members In General, Mother. Colon Cancer Father. Colon Polyps Mother. Depression Mother. Diabetes Mellitus Mother. Hypertension Father, Mother. Melanoma Family Members In General. Respiratory Condition Family Members In General, Mother.  Pregnancy / Birth History Elbert Ewings, CMA; 12/09/2014 3:15 PM) Age at menarche 14 years. Age of menopause 15-50 Gravida 2 Irregular periods Maternal age 30-20 Para 0  Review of Systems Eddie Dibbles S. Marlou Starks MD; 12/09/2014 5:02 PM) General Not Present- Appetite Loss, Chills, Fatigue, Fever, Night Sweats, Weight Gain and Weight Loss. Skin Not Present- Change in Wart/Mole, Dryness, Hives, Jaundice, New Lesions, Non-Healing Wounds, Rash and Ulcer. HEENT Not Present- Earache, Hearing Loss, Hoarseness, Nose Bleed, Oral Ulcers, Ringing in the Ears, Seasonal Allergies, Sinus Pain, Sore Throat, Visual Disturbances, Wears glasses/contact lenses and Yellow Eyes. Respiratory Not Present- Bloody sputum, Chronic Cough, Difficulty Breathing, Snoring and Wheezing. Breast Not Present- Breast Mass, Breast Pain, Nipple Discharge and Skin Changes. Cardiovascular Not Present- Chest Pain, Difficulty Breathing Lying Down, Leg Cramps, Palpitations, Rapid Heart Rate, Shortness of Breath and Swelling of Extremities. Gastrointestinal Not Present- Abdominal Pain,  Bloating, Bloody Stool, Change in Bowel Habits, Chronic diarrhea, Constipation, Difficulty Swallowing, Excessive gas, Gets full quickly at meals, Hemorrhoids, Indigestion, Nausea, Rectal Pain and Vomiting. Female Genitourinary Not Present- Frequency, Nocturia, Painful Urination,  Pelvic Pain and Urgency. Musculoskeletal Not Present- Back Pain, Joint Pain, Joint Stiffness, Muscle Pain, Muscle Weakness and Swelling of Extremities. Neurological Not Present- Decreased Memory, Fainting, Headaches, Numbness, Seizures, Tingling, Tremor, Trouble walking and Weakness. Psychiatric Not Present- Anxiety, Bipolar, Change in Sleep Pattern, Depression, Fearful and Frequent crying. Endocrine Not Present- Cold Intolerance, Excessive Hunger, Hair Changes, Heat Intolerance, Hot flashes and New Diabetes. Hematology Not Present- Easy Bruising, Excessive bleeding, Gland problems, HIV and Persistent Infections.   Vitals Elbert Ewings CMA; 12/09/2014 3:24 PM) 12/09/2014 3:23 PM Weight: 137.8 lb Height: 63in Body Surface Area: 1.67 m Body Mass Index: 24.41 kg/m Temp.: 97.37F(Temporal)  Pulse: 70 (Regular)  Resp.: 16 (Unlabored)  BP: 134/72 (Sitting, Left Arm, Standard)    Physical Exam Eddie Dibbles S. Marlou Starks MD; 12/09/2014 5:03 PM) General Mental Status-Alert. General Appearance-Consistent with stated age. Hydration-Well hydrated. Voice-Normal.  Head and Neck Head-normocephalic, atraumatic with no lesions or palpable masses. Trachea-midline. Thyroid Gland Characteristics - normal size and consistency.  Eye Eyeball - Bilateral-Extraocular movements intact. Sclera/Conjunctiva - Bilateral-No scleral icterus.  Chest and Lung Exam Chest and lung exam reveals -quiet, even and easy respiratory effort with no use of accessory muscles and on auscultation, normal breath sounds, no adventitious sounds and normal vocal resonance. Inspection Chest Wall - Normal. Back - normal.  Breast Note:  She has bilateral reduction scars and has apparently had bilateral submuscular saline implants. There is no palpable mass in either breast. There is no palpable axillary, supra clavicular, or cervical lymphadenopathy.   Cardiovascular Cardiovascular examination reveals -normal heart sounds, regular rate and rhythm with no murmurs and normal pedal pulses bilaterally.  Abdomen Inspection Inspection of the abdomen reveals - No Hernias. Skin - Scar - no surgical scars. Palpation/Percussion Palpation and Percussion of the abdomen reveal - Soft, Non Tender, No Rebound tenderness, No Rigidity (guarding) and No hepatosplenomegaly. Auscultation Auscultation of the abdomen reveals - Bowel sounds normal.  Neurologic Neurologic evaluation reveals -alert and oriented x 3 with no impairment of recent or remote memory. Mental Status-Normal.  Musculoskeletal Normal Exam - Left-Upper Extremity Strength Normal and Lower Extremity Strength Normal. Normal Exam - Right-Upper Extremity Strength Normal and Lower Extremity Strength Normal.  Lymphatic Head & Neck  General Head & Neck Lymphatics: Bilateral - Description - Normal. Axillary  General Axillary Region: Bilateral - Description - Normal. Tenderness - Non Tender. Femoral & Inguinal  Generalized Femoral & Inguinal Lymphatics: Bilateral - Description - Normal. Tenderness - Non Tender.    Assessment & Plan Eddie Dibbles S. Marlou Starks MD; 12/09/2014 4:05 PM) LEFT BREAST MASS (611.72  N63) Impression: The patient has a poorly defined mass in the lower inner quadrant of the left breast. The biopsy of this area was unsuccessful. At this point the recommendation is for an open biopsy to get a diagnosis. She will require a radioactive seed localized lumpectomy for a wire localized lumpectomy to remove this area. I have discussed with her in detail the risks and benefits of the operation to do this as well as some of the technical aspects and she understands  and wishes to proceed     Signed by Luella Cook, MD (12/09/2014 5:03 PM)

## 2014-12-26 NOTE — Op Note (Signed)
12/26/2014  10:48 AM  PATIENT:  Sarah Reeves  54 y.o. female  PRE-OPERATIVE DIAGNOSIS:  Left Sided Breast Mass  POST-OPERATIVE DIAGNOSIS:  Left Sided Breast Mass  PROCEDURE:  Procedure(s): LEFT BREAST LUMPECTOMY WITH RADIOACTIVE SEED LOCALIZATION (Left)  SURGEON:  Surgeon(s) and Role:    * Jovita Kussmaul, MD - Primary  PHYSICIAN ASSISTANT:   ASSISTANTS: none   ANESTHESIA:   general  EBL:  Total I/O In: 1000 [I.V.:1000] Out: -   BLOOD ADMINISTERED:none  DRAINS: none   LOCAL MEDICATIONS USED:  MARCAINE     SPECIMEN:  Source of Specimen:  left breast tissue  DISPOSITION OF SPECIMEN:  PATHOLOGY  COUNTS:  YES  TOURNIQUET:  * No tourniquets in log *  DICTATION: .Dragon Dictation  After informed consent was obtained the patient was brought to the operating room and placed in the supine position on the operating room table. After adequate induction of general anesthesia the patient's left breast was prepped with ChloraPrep, allowed to dry, and draped in usual sterile manner. Previously a radioactive I-125 seed had been placed in the inner aspect of the left breast to mark a mass that was unable to be needle biopsied. At this point the neoprobe set to I-125 was able to locate the area of radioactivity in the lower inner left breast. A small elliptical incision was made in the skin overlying this mass with a 15 blade knife. This incision was carried through the skin and subcutaneous tissue sharply with electrocautery. The neoprobe was used to frequently checked the position of the radioactive seed and a circular portion of breast tissue was excised sharply with the electrocautery around the area of radioactivity. Once the specimen was removed it was oriented with a short double Vicryl stitch on the anterior surface a short single silk stitch on the superior surface and a long single silk stitch on the lateral surface. There was radioactivity confirmed in the specimen and there was no  residual radioactivity in the breast. A specimen radiograph showed the clip and seed both be in the center of the specimen. The specimen was then sent to pathology for further evaluation. Hemostasis was achieved using the Bovie electrocautery. The wound was infiltrated with quarter percent Marcaine. The deep layer of the wound was then closed with interrupted 3-0 Vicryl stitches. The skin was then closed with interrupted 4-0 Monocryl subcuticular stitches. Dermabond dressings were applied. Patient tolerated the procedure well. At the end of the case all needle sponge and instrument counts were correct. The patient was then awakened and taken to recovery in stable condition.  PLAN OF CARE: Discharge to home after PACU  PATIENT DISPOSITION:  PACU - hemodynamically stable.   Delay start of Pharmacological VTE agent (>24hrs) due to surgical blood loss or risk of bleeding: not applicable

## 2014-12-26 NOTE — Anesthesia Postprocedure Evaluation (Addendum)
Anesthesia Post Note  Patient: Sarah Reeves  Procedure(s) Performed: Procedure(s) (LRB): LEFT BREAST LUMPECTOMY WITH RADIOACTIVE SEED LOCALIZATION (Left)  Anesthesia type: General  Patient location: PACU  Post pain: Pain level controlled  Post assessment: Post-op Vital signs reviewed  Last Vitals: BP 132/80 mmHg  Pulse 72  Temp(Src) 36.6 C (Oral)  Resp 18  Ht 5' 3.5" (1.613 m)  Wt 134 lb (60.782 kg)  BMI 23.36 kg/m2  SpO2 99%  Post vital signs: Reviewed  Level of consciousness: sedated  Complications: No apparent anesthesia complications

## 2014-12-26 NOTE — Discharge Instructions (Signed)

## 2014-12-26 NOTE — Anesthesia Procedure Notes (Signed)
Procedure Name: LMA Insertion Date/Time: 12/26/2014 10:14 AM Performed by: Teagan Heidrick Pre-anesthesia Checklist: Patient identified, Emergency Drugs available, Suction available and Patient being monitored Patient Re-evaluated:Patient Re-evaluated prior to inductionOxygen Delivery Method: Circle System Utilized Preoxygenation: Pre-oxygenation with 100% oxygen Intubation Type: IV induction Ventilation: Mask ventilation without difficulty LMA: LMA inserted LMA Size: 4.0 Number of attempts: 1 Airway Equipment and Method: Bite block Placement Confirmation: positive ETCO2 Tube secured with: Tape Dental Injury: Teeth and Oropharynx as per pre-operative assessment

## 2014-12-27 ENCOUNTER — Encounter (HOSPITAL_BASED_OUTPATIENT_CLINIC_OR_DEPARTMENT_OTHER): Payer: Self-pay | Admitting: General Surgery

## 2015-04-13 ENCOUNTER — Telehealth: Payer: Self-pay | Admitting: Cardiology

## 2015-04-13 NOTE — Telephone Encounter (Signed)
Received records from [facility] for appointment on 06/22/15 with Dr Ellyn Hack.  Records given to [facility] (medical records) for Dr Allison Quarry schedule on 06/22/15. lp

## 2015-04-18 ENCOUNTER — Other Ambulatory Visit: Payer: Self-pay | Admitting: Internal Medicine

## 2015-04-18 DIAGNOSIS — R42 Dizziness and giddiness: Secondary | ICD-10-CM

## 2015-04-21 ENCOUNTER — Ambulatory Visit
Admission: RE | Admit: 2015-04-21 | Discharge: 2015-04-21 | Disposition: A | Discharge status: Home / Self Care | Payer: 59 | Source: Ambulatory Visit | Attending: Internal Medicine | Admitting: Internal Medicine

## 2015-04-21 DIAGNOSIS — R42 Dizziness and giddiness: Secondary | ICD-10-CM

## 2015-05-03 ENCOUNTER — Ambulatory Visit
Admission: RE | Admit: 2015-05-03 | Discharge: 2015-05-03 | Disposition: A | Discharge status: Home / Self Care | Payer: 59 | Source: Ambulatory Visit | Attending: Internal Medicine | Admitting: Internal Medicine

## 2015-05-03 DIAGNOSIS — R42 Dizziness and giddiness: Secondary | ICD-10-CM

## 2015-05-03 MED ORDER — GADOBENATE DIMEGLUMINE 529 MG/ML IV SOLN: 10.0000 mL | Freq: Once | INTRAVENOUS | Status: AC | PRN

## 2015-06-20 ENCOUNTER — Ambulatory Visit (INDEPENDENT_AMBULATORY_CARE_PROVIDER_SITE_OTHER): Payer: 59 | Admitting: Neurology

## 2015-06-20 ENCOUNTER — Encounter: Payer: Self-pay | Admitting: Neurology

## 2015-06-20 VITALS — BP 116/88 | HR 80 | Ht 63.0 in | Wt 132.0 lb

## 2015-06-20 DIAGNOSIS — R202 Paresthesia of skin: Secondary | ICD-10-CM | POA: Diagnosis not present

## 2015-06-20 NOTE — Patient Instructions (Addendum)
  We will get EMG and NCV evaluation for the leg and foot numbness.  Fibromyalgia Fibromyalgia is a disorder that is often misunderstood. It is associated with muscular pains and tenderness that comes and goes. It is often associated with fatigue and sleep disturbances. Though it tends to be long-lasting, fibromyalgia is not life-threatening. CAUSES  The exact cause of fibromyalgia is unknown. People with certain gene types are predisposed to developing fibromyalgia and other conditions. Certain factors can play a role as triggers, such as:  Spine disorders.  Arthritis.  Severe injury (trauma) and other physical stressors.  Emotional stressors. SYMPTOMS   The main symptom is pain and stiffness in the muscles and joints, which can vary over time.  Sleep and fatigue problems. Other related symptoms may include:  Bowel and bladder problems.  Headaches.  Visual problems.  Problems with odors and noises.  Depression or mood changes.  Painful periods (dysmenorrhea).  Dryness of the skin or eyes. DIAGNOSIS  There are no specific tests for diagnosing fibromyalgia. Patients can be diagnosed accurately from the specific symptoms they have. The diagnosis is made by determining that nothing else is causing the problems. TREATMENT  There is no cure. Management includes medicines and an active, healthy lifestyle. The goal is to enhance physical fitness, decrease pain, and improve sleep. HOME CARE INSTRUCTIONS   Only take over-the-counter or prescription medicines as directed by your caregiver. Sleeping pills, tranquilizers, and pain medicines may make your problems worse.  Low-impact aerobic exercise is very important and advised for treatment. At first, it may seem to make pain worse. Gradually increasing your tolerance will overcome this feeling.  Learning relaxation techniques and how to control stress will help you. Biofeedback, visual imagery, hypnosis, muscle relaxation, yoga, and  meditation are all options.  Anti-inflammatory medicines and physical therapy may provide short-term help.  Acupuncture or massage treatments may help.  Take muscle relaxant medicines as suggested by your caregiver.  Avoid stressful situations.  Plan a healthy lifestyle. This includes your diet, sleep, rest, exercise, and friends.  Find and practice a hobby you enjoy.  Join a fibromyalgia support group for interaction, ideas, and sharing advice. This may be helpful. SEEK MEDICAL CARE IF:  You are not having good results or improvement from your treatment. FOR MORE INFORMATION  National Fibromyalgia Association: www.fmaware.[place]: www.arthritis.org Document Released: 10/07/2005 Document Revised: 12/30/2011 Document Reviewed: 01/17/2010 [facility] Patient Information 2015 [place], [place]. This information is not intended to replace advice given to you by your health care provider. Make sure you discuss any questions you have with your health care provider.

## 2015-06-20 NOTE — Progress Notes (Signed)
Reason for visit: Paresthesias  Referring physician: Dr. Nehemiah Massed Sarah Reeves is a 54 y.o. female  History of present illness:  Sarah Reeves is a 54 year old right-handed white female with a history of fibromyalgia and hepatitis C. The patient has noted a history of numbness and paresthesias in the feet that have been present for 2 or 3 years. The patient indicates that the symptoms are worse in the left foot, but she indicates that she is having sciatica type pain from the back down the right leg, and she feels somewhat weak on the right side of the body. The patient had an episode 3 months ago where she got up out of bed, and felt staggery, she was unable to ambulate well. She underwent MRI evaluation of the brain following this, and the study was normal. She reports some episodic headaches occurring 2 or 3 times a week, sometimes associated with dizziness. The patient may have sensitivity to odors and bright lights. The patient denies any issues controlling the bowels or the bladder. She does have some discomfort all up and down the back and into the hips associated with the fibromyalgia. She has had blood work done that includes a serum and urine protein electrophoresis, vitamin B12 level, hemoglobin A1c, CBC and comprehensive metabolic profile. These studies were unremarkable. She is sent to this office for an evaluation.  Past Medical History  Diagnosis Date  . Asthma   . Hepatitis C   . Hyperlipemia   . Mental disorder   . Anxiety   . Depression   . Fibromyalgia   . Complication of anesthesia     takes alot to sedated  . PONV (postoperative nausea and vomiting)   . GERD (gastroesophageal reflux disease)   . Neuropathy   . Insomnia   . Herpes simplex   . Hepatitis C     Past Surgical History  Procedure Laterality Date  . Breast reduction surgery    . Breast enhancement surgery    . Temporomandibular joint surgery    . Tubal ligation    . Exploratory laparotomy    .  Colonoscopy    . Upper gi endoscopy      x5  . Breast lumpectomy with radioactive seed localization Left 12/26/2014    Procedure: LEFT BREAST LUMPECTOMY WITH RADIOACTIVE SEED LOCALIZATION;  Surgeon: Autumn Messing III, MD;  Location: [facility];  Service: General;  Laterality: Left;    Family History  Problem Relation Age of Onset  . Diabetes Mother   . Lung disease Mother   . Heart disease Mother   . Breast cancer Mother   . Heart disease Father   . Colon cancer Father   . Hypothyroidism Brother   . Alzheimer's disease Maternal Aunt   . Skin cancer Maternal Aunt   . Alzheimer's disease Maternal Grandmother     Social history:  reports that she has been smoking Cigarettes.  She has been smoking about 0.50 packs per day. She has never used smokeless tobacco. She reports that she does not drink alcohol or use illicit drugs.  Medications:  Prior to Admission medications   Medication Sig Start Date End Date Taking? Authorizing Provider  Ascorbic Acid (VITAMIN C) 1000 MG tablet Take 1,000 mg by mouth daily.   Yes Historical Provider, MD  calcium carbonate 1250 MG capsule Take 1,250 mg by mouth 2 (two) times daily with a meal.   Yes Historical Provider, MD  cetirizine (ZYRTEC) 10 MG tablet Take  1 tablet (10 mg total) by mouth daily. 06/16/13  Yes Ruben Im, PA-C  cholecalciferol (VITAMIN D) 1000 UNITS tablet Take 1,000 Units by mouth daily.   Yes Historical Provider, MD  cyclobenzaprine (FLEXERIL) 10 MG tablet Take 10 mg by mouth 3 (three) times daily as needed for muscle spasms.   Yes Historical Provider, MD  esomeprazole (NEXIUM) 40 MG capsule Take 40 mg by mouth daily at 12 noon.   Yes Historical Provider, MD  estradiol (VIVELLE-DOT) 0.075 MG/24HR Place 1 patch onto the skin 2 (two) times a week.   Yes Historical Provider, MD  fluticasone (FLONASE) 50 MCG/ACT nasal spray Place into both nostrils daily.   Yes Historical Provider, MD  gabapentin (NEURONTIN) 300 MG capsule  Take 300 mg by mouth 2 (two) times daily as needed.   Yes Historical Provider, MD  LORazepam (ATIVAN) 1 MG tablet Take 1 mg by mouth 2 (two) times daily.   Yes Historical Provider, MD  magnesium oxide (MAG-OX) 400 MG tablet Take 400 mg by mouth daily.   Yes Historical Provider, MD  oxyCODONE (OXY IR/ROXICODONE) 5 MG immediate release tablet Take 5 mg by mouth every 4 (four) hours as needed for severe pain.   Yes Historical Provider, MD  oxyCODONE-acetaminophen (ROXICET) 5-325 MG per tablet Take 1-2 tablets by mouth every 4 (four) hours as needed. 12/26/14  Yes Autumn Messing III, MD  progesterone (ENDOMETRIN) 100 MG vaginal insert Place 100 mg vaginally at bedtime.   Yes Historical Provider, MD  valACYclovir (VALTREX) 500 MG tablet Take 1 tablet (500 mg total) by mouth 2 (two) times daily. 06/16/13  Yes Milta Deiters T Mashburn, PA-C  zolpidem (AMBIEN) 10 MG tablet Take 10 mg by mouth at bedtime as needed for sleep.   Yes Historical Provider, MD      Allergies  Allergen Reactions  . Latex Itching    Itch and blisters from contact  . Oysters [Shellfish Allergy] Other (See Comments)    Unknown reaction; "MD told me I was allergic"  . Zoloft [Sertraline Hcl]     nightmares    ROS:  Out of a complete 14 system review of symptoms, the patient complains only of the following symptoms, and all other reviewed systems are negative.  Fatigue Chest pain, palpitations of the heart Hearing loss, ringing in the ears Blurring of vision, double vision Joint pain, muscle cramps, aching muscles Allergies, runny nose, skin sensitivity Headache, numbness, weakness, dizziness Depression, anxiety Restless legs  Blood pressure 116/88, pulse 80, height 5\' 3"  (1.6 m), weight 132 lb (59.875 kg).  Physical Exam  General: The patient is alert and cooperative at the time of the examination.  Eyes: Pupils are equal, round, and reactive to light. Discs are flat bilaterally.  Neck: The neck is supple, no carotid bruits  are noted.  Respiratory: The respiratory examination is clear.  Cardiovascular: The cardiovascular examination reveals a regular rate and rhythm, no obvious murmurs or rubs are noted.  Skin: Extremities are without significant edema.  Neurologic Exam  Mental status: The patient is alert and oriented x 3 at the time of the examination. The patient has apparent normal recent and remote memory, with an apparently normal attention span and concentration ability.  Cranial nerves: Facial symmetry is present. There is good sensation of the face to pinprick and soft touch bilaterally. The strength of the facial muscles and the muscles to head turning and shoulder shrug are normal bilaterally. Speech is well enunciated, no aphasia or dysarthria is noted.  Extraocular movements are full. Visual fields are full. The tongue is midline, and the patient has symmetric elevation of the soft palate. No obvious hearing deficits are noted.  Motor: The motor testing reveals 5 over 5 strength of all 4 extremities. Good symmetric motor tone is noted throughout.  Sensory: Sensory testing is intact to pinprick, soft touch, vibration sensation, and position sense on all 4 extremities. No definite stocking pattern pinprick sensory deficit is noted. No evidence of extinction is noted.  Coordination: Cerebellar testing reveals good finger-nose-finger and heel-to-shin bilaterally.  Gait and station: Gait is normal. Tandem gait is normal. Romberg is negative. No drift is seen.  Reflexes: Deep tendon reflexes are symmetric and normal bilaterally. Toes are downgoing bilaterally.   Assessment/Plan:  1. Paresthesias, all 4 extremities  2. Fibromyalgia  3. History of hepatitis C  The patient is reporting some tingling and numbness in the hands and feet. The patient reports some sciatica type pain down the right leg. The clinical examination is unremarkable. The patient will be set up for nerve conduction studies of  both legs, and on the right arm. EMG evaluation will be done on the right leg. She will return for the above study.  Jill Alexanders MD 06/20/2015 7:11 PM  [facility] [address] [place], [place] [address]  Phone [phone] Fax [phone]

## 2015-06-22 ENCOUNTER — Ambulatory Visit (INDEPENDENT_AMBULATORY_CARE_PROVIDER_SITE_OTHER): Payer: 59 | Admitting: Cardiology

## 2015-06-22 VITALS — BP 114/72 | HR 73 | Ht 64.0 in | Wt 130.3 lb

## 2015-06-22 DIAGNOSIS — R079 Chest pain, unspecified: Secondary | ICD-10-CM

## 2015-06-22 DIAGNOSIS — R42 Dizziness and giddiness: Secondary | ICD-10-CM | POA: Diagnosis not present

## 2015-06-22 DIAGNOSIS — R739 Hyperglycemia, unspecified: Secondary | ICD-10-CM | POA: Diagnosis not present

## 2015-06-22 NOTE — Progress Notes (Signed)
PATIENT: Sarah Reeves MRN: [id number] DOB: 1961-01-02 PCP: Jani Gravel, MD  Clinic Note: Chief Complaint  Patient presents with  . New Evaluation    lightheadedness/ legs felt heavy and weak, felt like she was being pulled to the right,lasted for a few days/ muscles on left side feel weaker since then  . Peripheral Neuropathy    right foot  . Chest Pain    happens sparatically/pressure that goes up from chest to her neck and jaw and towards the right/ baby aspirin and Ativan made it go away  . Tinnitus    feels like noise inside her head/ sound of shaking a lightbulb or crickets, different levels of sound/ today sounds more like a hiss  . Depression    high anxiety    HPI: Sarah Reeves is a 54 y.o. female with a PMH below who presents today for cardiology reevaluation for chest pain. I saw her several years ago to evaluate to make a chest discomfort symptoms which were evaluated with a GXT Stress Test that was negative for ischemia. She actually was doing relatively well since then up until the last few months. She has significant anxiety & seems to have chronic medical complaints with fibromyalgia pains.  At present, she says that she would not be able to walk on a TM due to joint pains &/or dizziness.  She saw her PCP for suitability of severe dizziness and trouble walking. She'll some clicking in her ears. She had bilateral carotid ultrasound ordered along with an MRI of the brain without contrast. Both this evaluation apparently did not show any significant findings.  Studies Reviewed:   Carotid Doppler US 04/21/2015: Minor carotid atherosclerosis, <50% / non-occlusive bilateral plaque  MRI brain with/without contrast 05/03/2015: Normal study. No gross abnormalities noted.  Interval History: [name] presents today (with her Husband - a patient of Dr. Sallyanne Kuster) patient standpoint. She describes multiple different symptoms, it is very difficult to really get the gist of what she is  actually same. From a cardiac standpoint it sounds like a couple months now she's been noticing occasional periods of chest pain and pressure that she describes a crushing sensation lasting up to 8-10 minutes. It is in the center of his chest radiates up both sides and up into her jaw. She hasn't had an episode in a while (a week or 2). These episodes usually occur when she is lying in bed and can increase with stress. She does not really exercise due to dizziness and joint pain.  She says it bothers her more when she is out in the heat and had very hard. It she's tried to push up walking some more faster than she usually would she may notice it. She has occasional palpitations, but nothing prolonged or sting to cause her to be lightheaded, dizzy or woozy. The dizziness happens without complications. No PND no orthopnea of prednisone she is asleep lying flat). No significant edema.   Basically she is not very active and has symptoms at rest as well as with minimal exertion. There are some typical and some atypical features..  The remainder of cardiac review of systems is as follows: Cardiovascular ROS: positive for - chest pain and dyspnea on exertion negative for - edema, orthopnea, paroxysmal nocturnal dyspnea or Syncope/near-syncope but has had dizziness. No TIAs / amaurosis fugax. :  Past Medical History  Diagnosis Date  . Asthma   . Hepatitis C     (Dr. Earlean Shawl) Treated with [facility] March-May  2016  . Hyperlipemia   . Mental disorder   . Anxiety   . Depression   . Fibromyalgia   . Complication of anesthesia     takes alot to sedated  . PONV (postoperative nausea and vomiting)   . GERD (gastroesophageal reflux disease)   . Neuropathy   . Insomnia   . Herpes simplex     Prior Cardiac Evaluation and Past Surgical History: Past Surgical History  Procedure Laterality Date  . Breast reduction surgery    . Breast enhancement surgery    . Temporomandibular joint surgery    . Tubal  ligation    . Exploratory laparotomy    . Colonoscopy    . Upper gi endoscopy      x5  . Breast lumpectomy with radioactive seed localization Left 12/26/2014    Procedure: LEFT BREAST LUMPECTOMY WITH RADIOACTIVE SEED LOCALIZATION;  Surgeon: Autumn Messing III, MD;  Location: [place];  Service: General;  Laterality: Left;    Allergies  Allergen Reactions  . Latex Itching    Itch and blisters from contact  . Oysters [Shellfish Allergy] Other (See Comments)    Unknown reaction; "MD told me I was allergic"  . Zoloft [Sertraline Hcl]     nightmares    Current Outpatient Prescriptions  Medication Sig Dispense Refill  . Ascorbic Acid (VITAMIN C) 1000 MG tablet Take 1,000 mg by mouth daily.    . calcium carbonate 1250 MG capsule Take 1,250 mg by mouth 2 (two) times daily with a meal.    . cetirizine (ZYRTEC) 10 MG tablet Take 1 tablet (10 mg total) by mouth daily.    . cholecalciferol (VITAMIN D) 1000 UNITS tablet Take 1,000 Units by mouth daily.    . cyclobenzaprine (FLEXERIL) 10 MG tablet Take 10 mg by mouth 3 (three) times daily as needed for muscle spasms.    Marland Kitchen esomeprazole (NEXIUM) 40 MG capsule Take 40 mg by mouth daily at 12 noon.    Marland Kitchen estradiol (VIVELLE-DOT) 0.075 MG/24HR Place 1 patch onto the skin 2 (two) times a week.    . fluticasone (FLONASE) 50 MCG/ACT nasal spray Place into both nostrils daily.    Marland Kitchen gabapentin (NEURONTIN) 300 MG capsule Take 300 mg by mouth 2 (two) times daily as needed.    Marland Kitchen LORazepam (ATIVAN) 1 MG tablet Take 1 mg by mouth 2 (two) times daily.    . magnesium oxide (MAG-OX) 400 MG tablet Take 400 mg by mouth daily.    Marland Kitchen oxyCODONE (OXY IR/ROXICODONE) 5 MG immediate release tablet Take 5 mg by mouth every 4 (four) hours as needed for severe pain.    Marland Kitchen oxyCODONE-acetaminophen (ROXICET) 5-325 MG per tablet Take 1-2 tablets by mouth every 4 (four) hours as needed. 50 tablet 0  . progesterone (ENDOMETRIN) 100 MG vaginal insert Place 100 mg vaginally at  bedtime.    . valACYclovir (VALTREX) 500 MG tablet Take 1 tablet (500 mg total) by mouth 2 (two) times daily.    Marland Kitchen zolpidem (AMBIEN) 10 MG tablet Take 10 mg by mouth at bedtime as needed for sleep.     No current facility-administered medications for this visit.   Facility-Administered Medications Ordered in Other Visits  Medication Dose Route Frequency Provider Last Rate Last Dose  . chlorhexidine (HIBICLENS) 4 % liquid 1 application  1 application Topical Once Autumn Messing III, MD      . chlorhexidine (HIBICLENS) 4 % liquid 1 application  1 application Topical Once Autumn Messing  III, MD        Social History   Social History Narrative   Patient drinks 2-3 cups of caffeine daily.   Patient is right handed.    family history includes Alzheimer's disease in her maternal aunt and maternal grandmother; Breast cancer in her mother; Colon cancer in her father; Diabetes in her mother; Heart disease in her father and mother; Hypothyroidism in her brother; Lung disease in her mother; Skin cancer in her maternal aunt.  ROS: A comprehensive Review of Systems - was performed Review of Systems  HENT: Positive for tinnitus.   Respiratory: Negative for cough, shortness of breath and wheezing.   Cardiovascular: Positive for chest pain and palpitations. Negative for orthopnea and claudication.  Gastrointestinal: Negative for blood in stool and melena.  Genitourinary: Negative for hematuria.  Musculoskeletal: Positive for myalgias, back pain (Chronic back pain) and joint pain (mostly knees and hips).  Skin: Negative.   Neurological: Positive for dizziness (Poor balance). Negative for sensory change and focal weakness.  Endo/Heme/Allergies: Negative for polydipsia. Does not bruise/bleed easily.       Novo hot/cold intolerance  Psychiatric/Behavioral: Positive for depression. The patient is nervous/anxious and has insomnia.   All other systems reviewed and are negative.   PHYSICAL EXAMwill do a BP 114/72  mmHg  Pulse 73  Ht 5\' 4"  (1.626 m)  Wt 130 lb 4.8 oz (59.104 kg)  BMI 22.36 kg/m2 General appearance: alert, cooperative, appears stated age, no distress and Very anxious appearing. Otherwise well-nourished and well-groomed. Neck: no adenopathy, no carotid bruit, no JVD and supple, symmetrical, trachea midline Lungs: clear to auscultation bilaterally, normal percussion bilaterally and Nonlabored, good air movement. Heart: regular rate and rhythm, S1, S2 normal, no murmur, click, rub or gallop and normal apical impulse Abdomen: soft, non-tender; bowel sounds normal; no masses,  no organomegaly Extremities: extremities normal, atraumatic, no cyanosis or edema Pulses: 2+ and symmetric Neurologic: Grossly normal; CN II-XII grossly intact.  Very anxious mood with labile affect   Adult ECG Report  Rate: 73 ;  Rhythm: normal sinus rhythm  QRS Axis: 67 ;  PR Interval: 120 ;  QRS Duration: 82 ; QTc: 418;  Voltages: normal  Conduction Disturbances: none;  Other Abnormalities: none   Narrative Interpretation: normal EKG  Recent Labs: to PCP 05/04/2015  Na+ 140, K+ 5.2, Cl- 100, HCO3- 25 , BUN 13, Cr 0.62, Glu 105, Ca2+ 9.4;   TC 212, TG 165, HDL 63, LDL 116     ASSESSMENT / PLAN: 54 year old woman with some typical and atypical features for angina. She does have chest discomfort which is typical in fact it is centrally located in the chest radiating to the neck. It is however not associated with exertion. The main limitation is that she does not really exert herself. She does have elevated fasting blood sugars of 105 suggesting glucose intolerance. Her LDL is close to 100 and her HDL is close to goal for her risk factors.  She is a current smoker.  At this point to evaluate chest pain and the most prudent course of action is noninvasive ischemic evaluation - since she is unable to walk on treadmill, we are limited to pharmacologic stress testing.  Problem List Items Addressed This Visit     Chest pain with moderate risk for cardiac etiology - Primary    Her symptoms again are both typical and atypical. She does have some risk factors and I think it is not unreasonable to assess her for ischemia.  Unfortunately she would not walk on treadmill. Therefore, I think the best course of action would be to perform a Lexiscan Myoview to exclude coronary ischemia.      Relevant Orders   Myocardial Perfusion Imaging   EKG 12-Lead (Completed)   Dizziness of unknown cause    She has strained the symptoms of that oftentimes associated with leaning to one side. Associated headache and tenderness. One of the reasons for her referral was to evaluate dizziness. This is not what we mostly talked about. She seemed to be more focused on the chest discomfort. She's had an MRI and carotid Dopplers already excluding any significant issues there. She is not describing significant palpitations to suggest an arrhythmia related dizziness. It doesn't sound to be orthostatic.   At this point see any potential cardiac /cardiovascular etiology.      Relevant Orders   Myocardial Perfusion Imaging   EKG 12-Lead (Completed)   Hyperglycemia (Chronic)    Noted on lateral view. Also listed as a test history. Does not have diagnosis of diabetes, however.         No orders of the defined types were placed in this encounter.    Although not a significant number of medical issues were addressed directly, V. Interview is very difficult very hard to get any clear description of symptoms. The patient also required redirecting and there was frequent interruptions from her husband. Overall this visit was at least 45-50 minutes not including the chart review and charting   Followup: next available after Myoview  Everet Flagg W. Ellyn Hack, M.D., M.S. Interventional Cardiolgy [facility]

## 2015-06-22 NOTE — Patient Instructions (Signed)
NO CHANGE WITH  MEDICATIONS  Your physician has requested that you have a lexiscan myoview. For further information please visit [email or web address]. Please follow instruction sheet, as given.  Your physician recommends that you schedule a follow-up appointment in [place] .

## 2015-06-26 ENCOUNTER — Encounter: Payer: Self-pay | Admitting: Cardiology

## 2015-06-26 DIAGNOSIS — R079 Chest pain, unspecified: Secondary | ICD-10-CM | POA: Insufficient documentation

## 2015-06-26 DIAGNOSIS — R42 Dizziness and giddiness: Secondary | ICD-10-CM | POA: Insufficient documentation

## 2015-06-26 DIAGNOSIS — R739 Hyperglycemia, unspecified: Secondary | ICD-10-CM | POA: Insufficient documentation

## 2015-06-26 NOTE — Assessment & Plan Note (Signed)
Her symptoms again are both typical and atypical. She does have some risk factors and I think it is not unreasonable to assess her for ischemia. Unfortunately she would not walk on treadmill. Therefore, I think the best course of action would be to perform a Lexiscan Myoview to exclude coronary ischemia.

## 2015-06-26 NOTE — Assessment & Plan Note (Signed)
Noted on lateral view. Also listed as a test history. Does not have diagnosis of diabetes, however.

## 2015-06-26 NOTE — Assessment & Plan Note (Signed)
She has strained the symptoms of that oftentimes associated with leaning to one side. Associated headache and tenderness. One of the reasons for her referral was to evaluate dizziness. This is not what we mostly talked about. She seemed to be more focused on the chest discomfort. She's had an MRI and carotid Dopplers already excluding any significant issues there. She is not describing significant palpitations to suggest an arrhythmia related dizziness. It doesn't sound to be orthostatic.   At this point see any potential cardiac /cardiovascular etiology.

## 2015-06-28 ENCOUNTER — Encounter: Payer: Self-pay | Admitting: Cardiology

## 2015-07-06 ENCOUNTER — Telehealth (HOSPITAL_COMMUNITY): Payer: Self-pay

## 2015-07-06 NOTE — Telephone Encounter (Signed)
Encounter complete. 

## 2015-07-07 ENCOUNTER — Ambulatory Visit (HOSPITAL_COMMUNITY)
Admission: RE | Admit: 2015-07-07 | Discharge: 2015-07-07 | Disposition: A | Discharge status: Home / Self Care | Payer: 59 | Source: Ambulatory Visit | Attending: Cardiology | Admitting: Cardiology

## 2015-07-07 DIAGNOSIS — R45851 Suicidal ideations: Secondary | ICD-10-CM | POA: Insufficient documentation

## 2015-07-07 DIAGNOSIS — Z8249 Family history of ischemic heart disease and other diseases of the circulatory system: Secondary | ICD-10-CM | POA: Insufficient documentation

## 2015-07-07 DIAGNOSIS — R079 Chest pain, unspecified: Secondary | ICD-10-CM | POA: Insufficient documentation

## 2015-07-07 DIAGNOSIS — R42 Dizziness and giddiness: Secondary | ICD-10-CM

## 2015-07-07 DIAGNOSIS — F17200 Nicotine dependence, unspecified, uncomplicated: Secondary | ICD-10-CM | POA: Insufficient documentation

## 2015-07-07 DIAGNOSIS — R0609 Other forms of dyspnea: Secondary | ICD-10-CM | POA: Diagnosis not present

## 2015-07-07 DIAGNOSIS — R002 Palpitations: Secondary | ICD-10-CM | POA: Insufficient documentation

## 2015-07-07 HISTORY — PX: NM MYOVIEW LTD: HXRAD82

## 2015-07-07 LAB — MYOCARDIAL PERFUSION IMAGING
Estimated workload: 1 METS
LV dias vol: 77 mL
LV sys vol: 36 mL
Peak BP: 129 mmHg
Peak HR: 114 {beats}/min
Percent of predicted max HR: 68 %
Rest HR: 66 {beats}/min
SDS: 0
SRS: 1
SSS: 1
Stage 1 DBP: 73 mmHg
Stage 1 Grade: 0 %
Stage 1 HR: 65 {beats}/min
Stage 1 SBP: 129 mmHg
Stage 1 Speed: 0 mph
Stage 2 Grade: 0 %
Stage 2 HR: 65 {beats}/min
Stage 2 Speed: 0 mph
Stage 3 DBP: 65 mmHg
Stage 3 Grade: 0 %
Stage 3 HR: 114 {beats}/min
Stage 3 SBP: 129 mmHg
Stage 3 Speed: 0 mph
Stage 4 DBP: 69 mmHg
Stage 4 Grade: 0 %
Stage 4 HR: 75 {beats}/min
Stage 4 SBP: 130 mmHg
Stage 4 Speed: 0 mph
TID: 1.28

## 2015-07-07 MED ORDER — AMINOPHYLLINE 25 MG/ML IV SOLN
75.0000 mg | Freq: Once | INTRAVENOUS | Status: AC
  Administered 2015-07-07: 75 mg via INTRAVENOUS

## 2015-07-07 MED ORDER — TECHNETIUM TC 99M SESTAMIBI GENERIC - CARDIOLITE
32.0000 | Freq: Once | INTRAVENOUS | Status: AC | PRN
  Administered 2015-07-07: 32 via INTRAVENOUS

## 2015-07-07 MED ORDER — TECHNETIUM TC 99M SESTAMIBI GENERIC - CARDIOLITE
10.9000 | Freq: Once | INTRAVENOUS | Status: AC | PRN
  Administered 2015-07-07: 10.9 via INTRAVENOUS

## 2015-07-07 MED ORDER — REGADENOSON 0.4 MG/5ML IV SOLN
0.4000 mg | Freq: Once | INTRAVENOUS | Status: AC
  Administered 2015-07-07: 0.4 mg via INTRAVENOUS

## 2015-07-11 ENCOUNTER — Telehealth: Payer: Self-pay | Admitting: *Deleted

## 2015-07-11 NOTE — Telephone Encounter (Signed)
Spoke to patient. Result given . Verbalized understanding  

## 2015-07-11 NOTE — Telephone Encounter (Signed)
-----   Message from Leonie Man, MD sent at 07/09/2015  1:23 PM EDT ----- Essentially normal nuclear stress tests with basically normal function. No areas to suggest prior heart attack, or areas of reduced blood flow that would cause chest pain.  Overall good news, suggesting no significant coronary artery disease. This would suggest the chest pain was not related to heart disease.  Leonie Man, MD

## 2015-07-14 ENCOUNTER — Telehealth: Payer: Self-pay | Admitting: Neurology

## 2015-07-14 NOTE — Telephone Encounter (Signed)
Will fwd to Dr. Jannifer Franklin and [place]. Thanks

## 2015-07-14 NOTE — Telephone Encounter (Signed)
I called patient. She indicates that she is having some balance issues, we will look for evidence of a neuropathy on the EMG nerve conduction study, she appears to be going to undergo the study.

## 2015-07-14 NOTE — Telephone Encounter (Signed)
Patient called back and has decided to keep NCS/EMG appointment. She was just feeling anxious about it after talking to a neighbor that told her it is really painful. Patient also would like to know if there are any medication restrictions prior to this ie: takes 1mg  Ativan and sometimes takes hydrocodone 5mg . Please call patient to advise.

## 2015-07-14 NOTE — Telephone Encounter (Signed)
Pt called and says that her back pain is better. She is wondering if she should still have the NCS/EMG. Please call and advise [phone]

## 2015-07-18 ENCOUNTER — Ambulatory Visit (INDEPENDENT_AMBULATORY_CARE_PROVIDER_SITE_OTHER): Payer: Self-pay | Admitting: Neurology

## 2015-07-18 ENCOUNTER — Ambulatory Visit (INDEPENDENT_AMBULATORY_CARE_PROVIDER_SITE_OTHER): Payer: 59 | Admitting: Neurology

## 2015-07-18 ENCOUNTER — Encounter: Payer: Self-pay | Admitting: Neurology

## 2015-07-18 DIAGNOSIS — R413 Other amnesia: Secondary | ICD-10-CM

## 2015-07-18 DIAGNOSIS — R202 Paresthesia of skin: Secondary | ICD-10-CM

## 2015-07-18 DIAGNOSIS — M5441 Lumbago with sciatica, right side: Secondary | ICD-10-CM | POA: Diagnosis not present

## 2015-07-18 NOTE — Procedures (Signed)
     HISTORY:  Sarah Reeves is a 54 year old patient with a history of paresthesias involving the feet and hands. The patient has pain in the back radiating down the right leg. The patient is being evaluated for possible neuropathy or a lumbar radiculopathy.  NERVE CONDUCTION STUDIES:  Nerve conduction studies were performed on the right upper extremity. The distal motor latencies and motor amplitudes for the median and ulnar nerves were within normal limits. The F wave latencies and nerve conduction velocities for these nerves were also normal. The sensory latencies for the median and ulnar nerves were normal.  Nerve conduction studies were performed on both lower extremities. The distal motor latencies and motor amplitudes for the peroneal and posterior tibial nerves were within normal limits. The nerve conduction velocities for these nerves were also normal. The H reflex latencies were normal. The sensory latencies for the peroneal nerves were within normal limits.   EMG STUDIES:  EMG study was performed on the right lower extremity:  The tibialis anterior muscle reveals 2 to 4K motor units with full recruitment. No fibrillations or positive waves were seen. The peroneus tertius muscle reveals 2 to 4K motor units with full recruitment. No fibrillations or positive waves were seen. The medial gastrocnemius muscle reveals 1 to 3K motor units with full recruitment. No fibrillations or positive waves were seen. The vastus lateralis muscle reveals 2 to 4K motor units with full recruitment. No fibrillations or positive waves were seen. The iliopsoas muscle reveals 2 to 4K motor units with full recruitment. No fibrillations or positive waves were seen. The biceps femoris muscle (long head) reveals 2 to 4K motor units with full recruitment. No fibrillations or positive waves were seen. The lumbosacral paraspinal muscles were tested at 3 levels, and revealed no abnormalities of insertional  activity at all 3 levels tested. There was good relaxation.   IMPRESSION:  Nerve conduction studies done on the right upper extremity and both lower extremities were unremarkable, without evidence of a peripheral neuropathy. EMG evaluation of the right lower extremity was unremarkable, without evidence of an overlying lumbosacral radiculopathy.  Jill Alexanders MD 07/18/2015 12:52 PM  [facility] [address] [place], [place] [address]  Phone [phone] Fax [phone]

## 2015-07-18 NOTE — Progress Notes (Signed)
Please refer to EMG and nerve conduction study procedure note. 

## 2015-07-18 NOTE — Progress Notes (Signed)
Sarah Reeves is a 54 year old patient with a history of back pain, and pain down the right leg, paresthesias on the feet and hands. The patient comes into the office today for EMG and nerve conduction study. Nerve conductions of both legs and the right arm were normal. EMG of the right leg was unremarkable.  The patient indicates that if she has any significant level of activity, the back pain and right leg pain will recur. The patient also reports some increasing problems with memory, history of fibromyalgia, and some anxiety issues. The patient will be sent for formal neuropsychological evaluation. MRI of the brain done previously was unremarkable.  We will send the patient for MRI evaluation of the lumbar spine.

## 2015-07-24 ENCOUNTER — Encounter: Payer: Self-pay | Admitting: Cardiology

## 2015-07-24 ENCOUNTER — Ambulatory Visit (INDEPENDENT_AMBULATORY_CARE_PROVIDER_SITE_OTHER): Payer: 59 | Admitting: Cardiology

## 2015-07-24 VITALS — BP 110/70 | HR 90 | Ht 64.0 in | Wt 131.5 lb

## 2015-07-24 DIAGNOSIS — Z716 Tobacco abuse counseling: Secondary | ICD-10-CM

## 2015-07-24 DIAGNOSIS — R42 Dizziness and giddiness: Secondary | ICD-10-CM

## 2015-07-24 DIAGNOSIS — R079 Chest pain, unspecified: Secondary | ICD-10-CM

## 2015-07-24 MED ORDER — NITROGLYCERIN 0.4 MG/SPRAY TL SOLN: 1.0000 | Status: DC | PRN

## 2015-07-24 NOTE — Patient Instructions (Signed)
MAY USE IBUPROFEN OR TYLENOL AS NEED FOR PAIN -- OVER THE COUNTER  MAY USE NTG( NITROGLYCERIN) SPRAY AS NEEDED  Your physician wants you to follow-up in  6 MONTHS with DR  HARDING. You will receive a reminder letter in the mail two months in advance. If you don't receive a letter, please call our office to schedule the follow-up appointment.

## 2015-07-24 NOTE — Progress Notes (Signed)
PCP: Jani Gravel, MD  Clinic Note: Chief Complaint  Patient presents with  . Follow-up    STRESS TEST  . Chest Pain    HPI: TRANIECE BOFFA is a 54 y.o. female with a PMH below who presents today for f/u evaluation of Chest Pain - s/p Myoview. She noted several different symptoms - chest pain radiating to her jaw, dizziness & palpitations.  Jennesis Ramaswamy Reedy was last seen on 06/22/15 --> Myoview ordered  Recent Hospitalizations:   Studies Reviewed:   Myoview 07/07/15: Normal Myocardial Perfusion Scan. Low risk lexiscan nuclear study with minimal insignificant breast attenuation and normal myocardial perfusion and function; EF 53% without wall motion abnormalities and normal systolic thickening  Interval History: She returns today still noting the chest discomfort that is starts in the setting of the chest and radiates to the jaw & feels strange. These episodes still occur intermittently but really his only had one true spells since she was last seen. She also notes that some of the sensation is starts as a pinching pain along left sternal border radiating outward down along the lower rib. She has been scared to do any activity despite hearing the results of her Myoview. Therefore is difficult to tell if she's had any exacerbation with exertion.  For the most part with whatever activity she does do she has not noted that it is worse with exertion. It can be either with or without exertion. She notes intermittent palpitations that come and go, but not long-lived. Less than a minute at a time.  She denies any exertional dyspnea. No PND, orthopnea or edema.  No lightheadedness, dizziness, weakness or syncope/near syncope. No TIA/amaurosis fugax symptoms. No claudication.  ROS: A comprehensive was performed. Review of Systems  HENT:       Less persistent tinnitus.   Cardiovascular:       Noted in history of present illness  Gastrointestinal: Negative for blood in stool and melena.    Genitourinary: Negative for dysuria and hematuria.  Musculoskeletal: Positive for back pain and joint pain.       Chronic back pain as well as knee and hip pain.  Neurological:       Less dizziness than poor balance  Psychiatric/Behavioral: Positive for depression. The patient is nervous/anxious and has insomnia.   All other systems reviewed and are negative.   Past Medical History  Diagnosis Date  . Asthma   . Hepatitis C     (Dr. Earlean Shawl) Treated with Harvoni March-May 2016  . Hyperlipemia   . Mental disorder   . Anxiety   . Depression   . Fibromyalgia   . Complication of anesthesia     takes alot to sedated  . PONV (postoperative nausea and vomiting)   . GERD (gastroesophageal reflux disease)   . Neuropathy ([place])   . Insomnia   . Herpes simplex     Past Surgical History  Procedure Laterality Date  . Breast reduction surgery    . Breast enhancement surgery    . Temporomandibular joint surgery    . Tubal ligation    . Exploratory laparotomy    . Colonoscopy    . Upper gi endoscopy      x5  . Breast lumpectomy with radioactive seed localization Left 12/26/2014    Procedure: LEFT BREAST LUMPECTOMY WITH RADIOACTIVE SEED LOCALIZATION;  Surgeon: Autumn Messing III, MD;  Location: [facility];  Service: General;  Laterality: Left;  . Nm myoview ltd  07/07/15  Normal Myocardial Perfusion Scan. Low risk lexiscan nuclear study with minimal insignificant breast attenuation and normal myocardial perfusion and function; EF 53% without wall motion abnormalities and normal systolic thickening   Prior to Admission medications   Medication Sig Start Date End Date Taking? Authorizing Provider  Ascorbic Acid (VITAMIN C) 1000 MG tablet Take 1,000 mg by mouth daily.    Historical Provider, MD  calcium carbonate 1250 MG capsule Take 1,250 mg by mouth 2 (two) times daily with a meal.    Historical Provider, MD  cetirizine (ZYRTEC) 10 MG tablet Take 1 tablet (10 mg total) by mouth  daily. 06/16/13   Ruben Im, PA-C  cholecalciferol (VITAMIN D) 1000 UNITS tablet Take 1,000 Units by mouth daily.    Historical Provider, MD  cyclobenzaprine (FLEXERIL) 10 MG tablet Take 10 mg by mouth 3 (three) times daily as needed for muscle spasms.    Historical Provider, MD  esomeprazole (NEXIUM) 40 MG capsule Take 40 mg by mouth daily at 12 noon.    Historical Provider, MD  estradiol (VIVELLE-DOT) 0.075 MG/24HR Place 1 patch onto the skin 2 (two) times a week.    Historical Provider, MD  fluticasone (FLONASE) 50 MCG/ACT nasal spray Place into both nostrils daily.    Historical Provider, MD  gabapentin (NEURONTIN) 300 MG capsule Take 300 mg by mouth 2 (two) times daily as needed.    Historical Provider, MD  LORazepam (ATIVAN) 1 MG tablet Take 1 mg by mouth 2 (two) times daily.    Historical Provider, MD  magnesium oxide (MAG-OX) 400 MG tablet Take 400 mg by mouth daily.    Historical Provider, MD  oxyCODONE (OXY IR/ROXICODONE) 5 MG immediate release tablet Take 5 mg by mouth every 4 (four) hours as needed for severe pain.    Historical Provider, MD  oxyCODONE-acetaminophen (ROXICET) 5-325 MG per tablet Take 1-2 tablets by mouth every 4 (four) hours as needed. 12/26/14   Autumn Messing III, MD  progesterone (ENDOMETRIN) 100 MG vaginal insert Place 100 mg vaginally at bedtime.    Historical Provider, MD  valACYclovir (VALTREX) 500 MG tablet Take 1 tablet (500 mg total) by mouth 2 (two) times daily. 06/16/13   Ruben Im, PA-C  zolpidem (AMBIEN) 10 MG tablet Take 10 mg by mouth at bedtime as needed for sleep.    Historical Provider, MD   Allergies  Allergen Reactions  . Oysters [Shellfish Allergy] Other (See Comments)    Unknown reaction; "MD told me I was allergic"  . Zoloft [Sertraline Hcl] Other (See Comments)    REACTION: Nightmares, Grind teeth really bad  . Latex Itching    Itch and blisters from contact     Social History   Social History  . Marital Status: Married    Spouse  Name: N/A  . Number of Children: 0  . Years of Education: 12   Occupational History  . 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 Smoking status: Current Every Day Smoker -- 0.50 packs/day    Types: Cigarettes  . Smokeless tobacco: Never Used  . Alcohol Use: No     Comment: few times a week  . Drug Use: No     Comment: did have hx drug abuse as teen  . Sexual Activity: Yes    Birth Control/ Protection: Surgical   Other Topics Concern  . None   Social History Narrative   Patient drinks 2-3 cups of caffeine daily.   Patient is right  handed.   Family History  Problem Relation Age of Onset  . Diabetes Mother   . Lung disease Mother   . Heart disease Mother   . Breast cancer Mother   . Heart disease Father   . Colon cancer Father   . Hypothyroidism Brother   . Alzheimer's disease Maternal Aunt   . Skin cancer Maternal Aunt   . Alzheimer's disease Maternal Grandmother     Wt Readings from Last 3 Encounters:  07/24/15 131 lb 8 oz (59.648 kg)  07/07/15 130 lb (58.968 kg)  06/22/15 130 lb 4.8 oz (59.104 kg)    PHYSICAL EXAM BP 110/70 mmHg  Pulse 90  Ht 5\' 4"  (1.626 m)  Wt 131 lb 8 oz (59.648 kg)  BMI 22.56 kg/m2  SpO2 99% General appearance: alert, cooperative, appears stated age, no distress and Very anxious appearing. Otherwise well-nourished and well-groomed. Neck: no adenopathy, no carotid bruit, no JVD and supple, symmetrical, trachea midline Lungs: clear to auscultation bilaterally, normal percussion bilaterally and Nonlabored, good air movement. Heart: regular rate and rhythm, S1, S2 normal, no murmur, click, rub or gallop and normal apical impulse Abdomen: soft, non-tender; bowel sounds normal; no masses, no organomegaly Extremities: extremities normal, atraumatic, no cyanosis or edema Pulses: 2+ and symmetric Neurologic: Grossly normal; CN II-XII grossly intact. Very anxious mood with labile affect    Adult ECG Report Not  checked   Other studies Reviewed: Additional studies/ records that were reviewed today include:  Recent Labs:  None available   ASSESSMENT / PLAN: Problem List Items Addressed This Visit    Tobacco abuse counseling (Chronic)    We talked briefly about the importance of smoking cessation. Did not seem that she was receiving mesh as well. Therefore I did not pursue any further counseling.      Dizziness of unknown cause    Symptoms definitely seem to be related with vertigo. Does not sound orthostatic. Would not pursue any cardiac evaluation.      Chest pain with moderate risk for cardiac etiology - Primary    Again both typical and atypical features for her pain. Negative Myoview would make macrovascular CAD less likely. Cannot exclude microvascular disease or spasm the episodes are often at rest. I don't think there is some anxiety component to this and it could also be costochondritis related. Recommended that she could use either ibuprofen or Tylenol for this discomfort.  Mostly to see if it would be of some benefit, I will prescribe sublingual nitroglycerin to use PRN. If she does notice some improvement in symptoms with sublingual nitroglycerin, I will add Imdur 30 mg daily.  I'll follow her back up in 6 months to review her symptoms.        Relevant Medications   nitroGLYCERIN (NITROLINGUAL) 0.4 MG/SPRAY spray      Current medicines are reviewed at length with the patient today. (+/- concerns) none The following changes have been made: Prescribed when necessary nitroglycerin Studies Ordered:   No orders of the defined types were placed in this encounter.      Leonie Man, M.D., M.S. Interventional Cardiologist   Pager # [phone]

## 2015-07-25 ENCOUNTER — Encounter: Payer: Self-pay | Admitting: Cardiology

## 2015-07-25 DIAGNOSIS — Z716 Tobacco abuse counseling: Secondary | ICD-10-CM | POA: Insufficient documentation

## 2015-07-25 NOTE — Assessment & Plan Note (Signed)
Symptoms definitely seem to be related with vertigo. Does not sound orthostatic. Would not pursue any cardiac evaluation.

## 2015-07-25 NOTE — Assessment & Plan Note (Addendum)
Again both typical and atypical features for her pain. Negative Myoview would make macrovascular CAD less likely. Cannot exclude microvascular disease or spasm the episodes are often at rest. I don't think there is some anxiety component to this and it could also be costochondritis related. Recommended that she could use either ibuprofen or Tylenol for this discomfort.  Mostly to see if it would be of some benefit, I will prescribe sublingual nitroglycerin to use PRN. If she does notice some improvement in symptoms with sublingual nitroglycerin, I will add Imdur 30 mg daily.  I'll follow her back up in 6 months to review her symptoms.

## 2015-07-25 NOTE — Assessment & Plan Note (Signed)
We talked briefly about the importance of smoking cessation. Did not seem that she was receiving mesh as well. Therefore I did not pursue any further counseling.

## 2015-07-26 ENCOUNTER — Telehealth: Payer: Self-pay | Admitting: Cardiology

## 2015-07-26 MED ORDER — NITROGLYCERIN 0.4 MG SL SUBL: 0.4000 mg | SUBLINGUAL_TABLET | SUBLINGUAL | Status: DC | PRN

## 2015-07-26 NOTE — Telephone Encounter (Signed)
Calling because her insurance will not pay for the spray Nitro. Wants to know if she can get the pills .Marland Kitchen Please call   Thanks

## 2015-07-26 NOTE — Telephone Encounter (Signed)
Spoke with pt, new script for tablets sent to the pharmacy

## 2015-07-26 NOTE — Telephone Encounter (Signed)
Closed encounter °

## 2015-08-02 ENCOUNTER — Other Ambulatory Visit: Payer: 59

## 2015-08-19 ENCOUNTER — Ambulatory Visit
Admission: RE | Admit: 2015-08-19 | Discharge: 2015-08-19 | Disposition: A | Discharge status: Home / Self Care | Payer: 59 | Source: Ambulatory Visit | Attending: Neurology | Admitting: Neurology

## 2015-08-19 DIAGNOSIS — R202 Paresthesia of skin: Secondary | ICD-10-CM

## 2015-08-19 DIAGNOSIS — M5441 Lumbago with sciatica, right side: Secondary | ICD-10-CM

## 2015-08-21 ENCOUNTER — Telehealth: Payer: Self-pay | Admitting: Neurology

## 2015-08-21 NOTE — Telephone Encounter (Signed)
   I called the patient. MRI of the low back shows disc bulges, no evidence of nerve root compression. The patient is having paresthesias all fours, no evidence of a neuropathy by EMG and nerve conduction study. At this point, may treat the patient symptomatically if the paresthesias are painful.  MRI lumbar 08/19/15:  IMPRESSION: This is an abnormal MRI of the lumbar spine showing small disc protrusions at L2-L3, L3-L4 and L4-L5 that do not lead to any significant foraminal or lateral recess stenosis. Additionally, at L3-L4 and L4-L5 there is mild facet hypertrophy. There does not appear to be any nerve root impingement.

## 2015-08-25 NOTE — Telephone Encounter (Signed)
I called the patient. MRI of the low back was relatively unremarkable. At this point, if the patient desires medication for treatment of the paresthesias, she is to contact our office. She does not wish to go on medications at this time.

## 2015-08-25 NOTE — Telephone Encounter (Addendum)
Pt called, sts she just checked cell phone. She prefers calls on home phone and she thanks you for calling,"it was nice hearing from the Dr". She is wondering where to go from here. Please call her at [phone].

## 2015-09-01 ENCOUNTER — Telehealth: Payer: Self-pay | Admitting: Neurology

## 2015-09-01 MED ORDER — PREGABALIN 50 MG PO CAPS: 50.0000 mg | ORAL_CAPSULE | Freq: Two times a day (BID) | ORAL | Status: DC

## 2015-09-01 NOTE — Telephone Encounter (Signed)
I called patient. The patient has fibromyalgia, she has had an increase in pain after doing some housework. She gets oxycodone from her primary care doctor, I will have her go off of the gabapentin which causes headaches and does not help her back pain, we will switch to Lyrica.

## 2015-09-01 NOTE — Telephone Encounter (Signed)
Pt called and states that she would like something for pain. She states she over exerted herself this week and is very uncomfortable. Please call and advise [phone]. Please use [facility]

## 2016-01-15 ENCOUNTER — Ambulatory Visit: Payer: 59 | Admitting: Neurology

## 2016-01-15 ENCOUNTER — Telehealth: Payer: Self-pay | Admitting: *Deleted

## 2016-01-15 NOTE — Telephone Encounter (Signed)
No showed todays appt.

## 2016-01-19 ENCOUNTER — Encounter: Payer: Self-pay | Admitting: Neurology

## 2016-03-11 IMAGING — MG MM LT RADIOACTIVE SEED LOC MAMMO GUIDE
7 series · 7 of 7 positions shown · non-contrast
Comparison: Previous exam(s).

CLINICAL DATA: Patient presents for radioactive seed localization
of the previously biopsied medial left breast mass.

EXAM:
MAMMOGRAPHIC GUIDED RADIOACTIVE SEED LOCALIZATION OF THE LEFT BREAST

[L ML (1 of 3)]
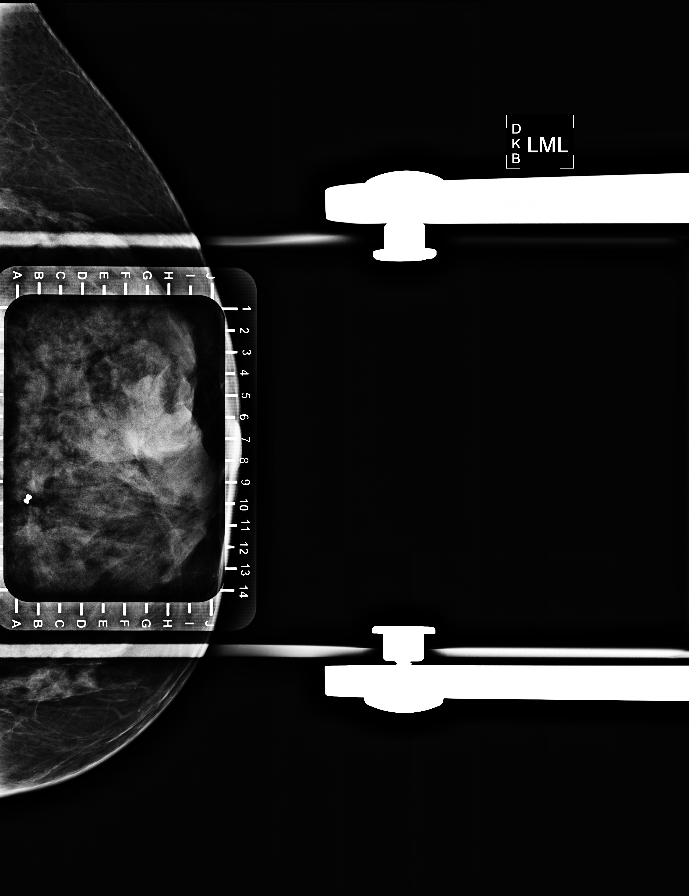

[L ML (2 of 3)]
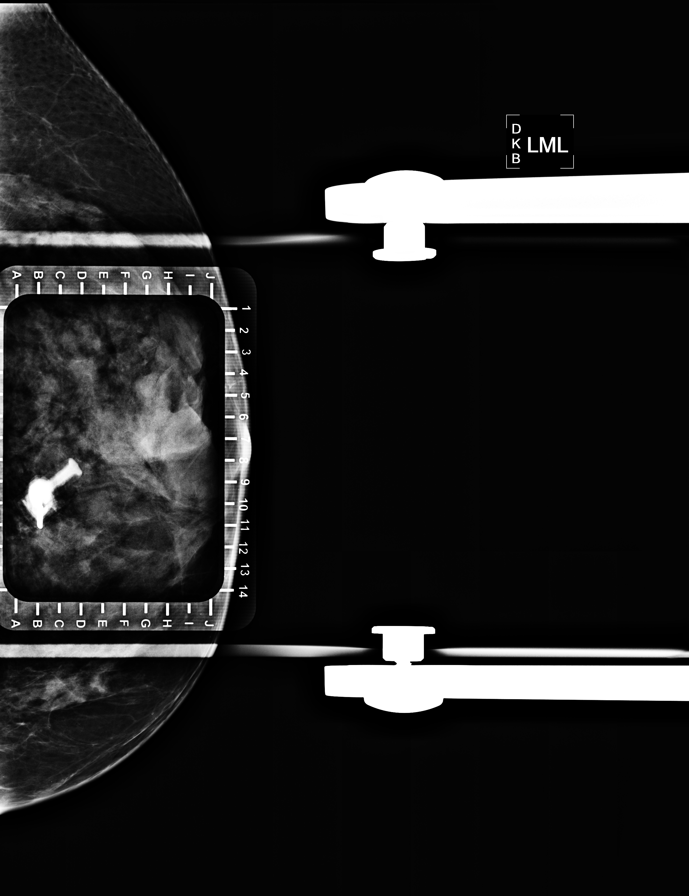

[L CC (1 of 4)]
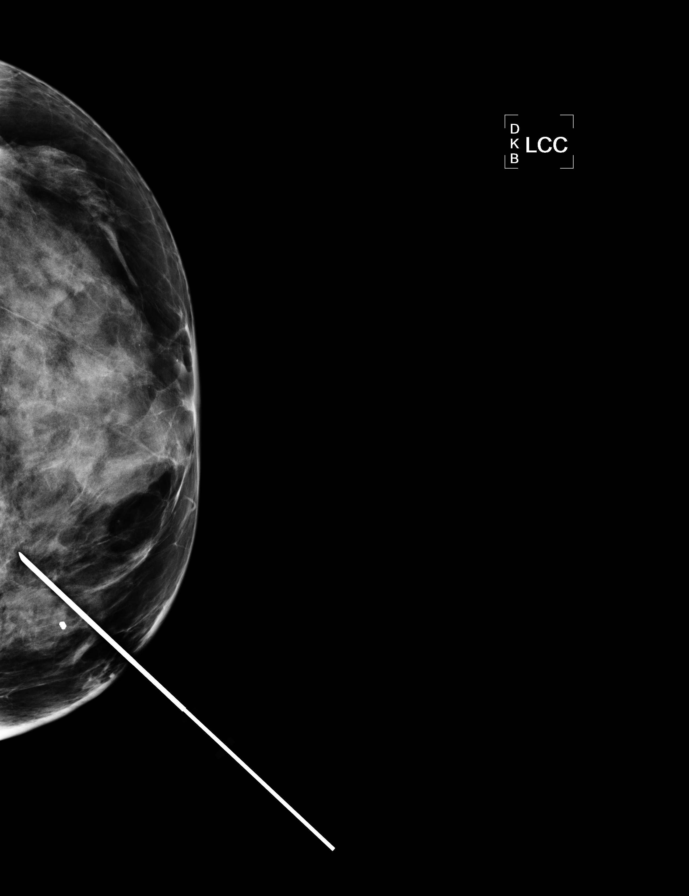

[L CC (2 of 4)]
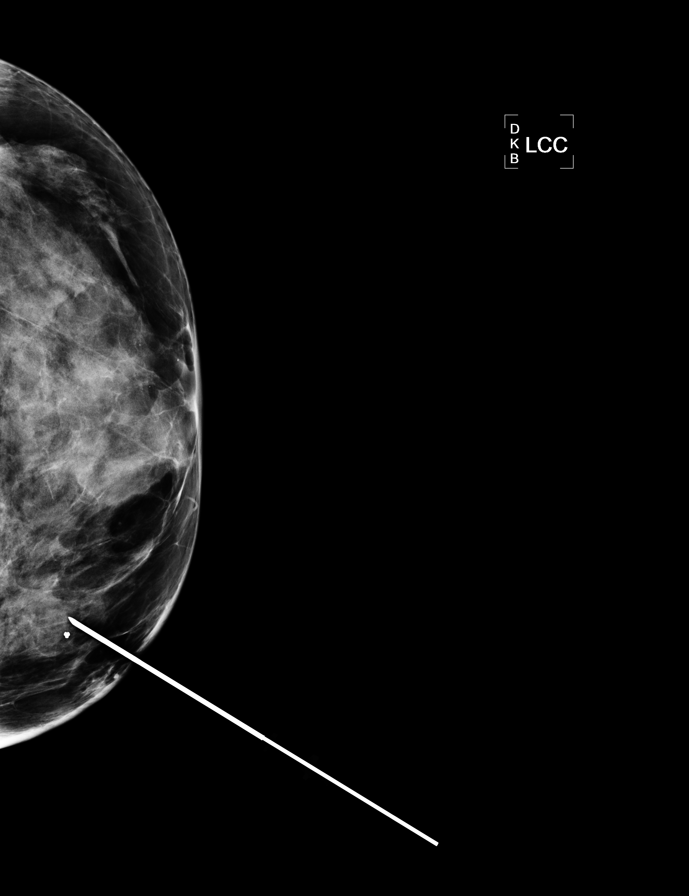

[L CC (3 of 4)]
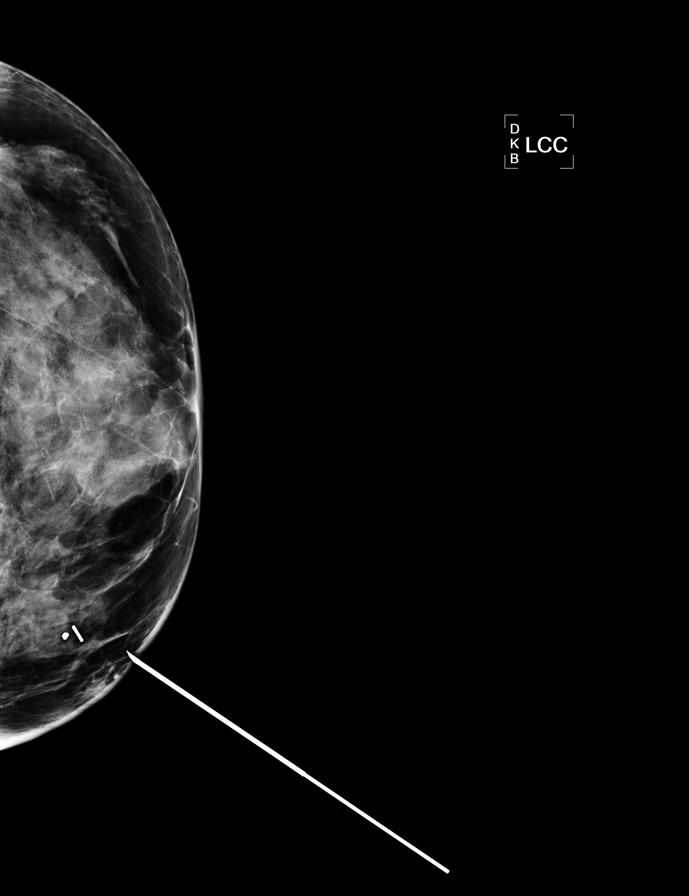

[L CC (4 of 4)]
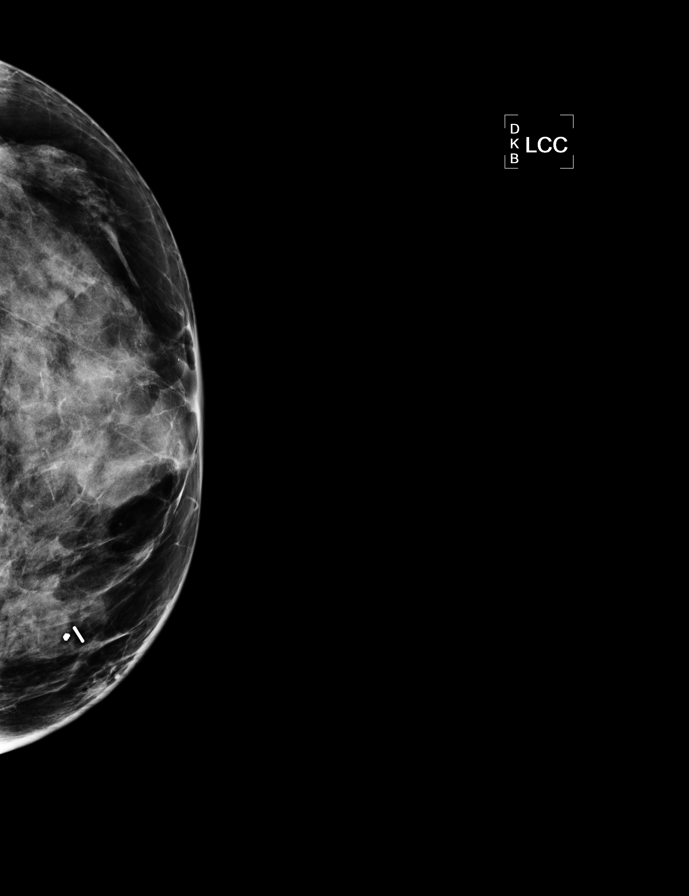

[L ML (3 of 3)]
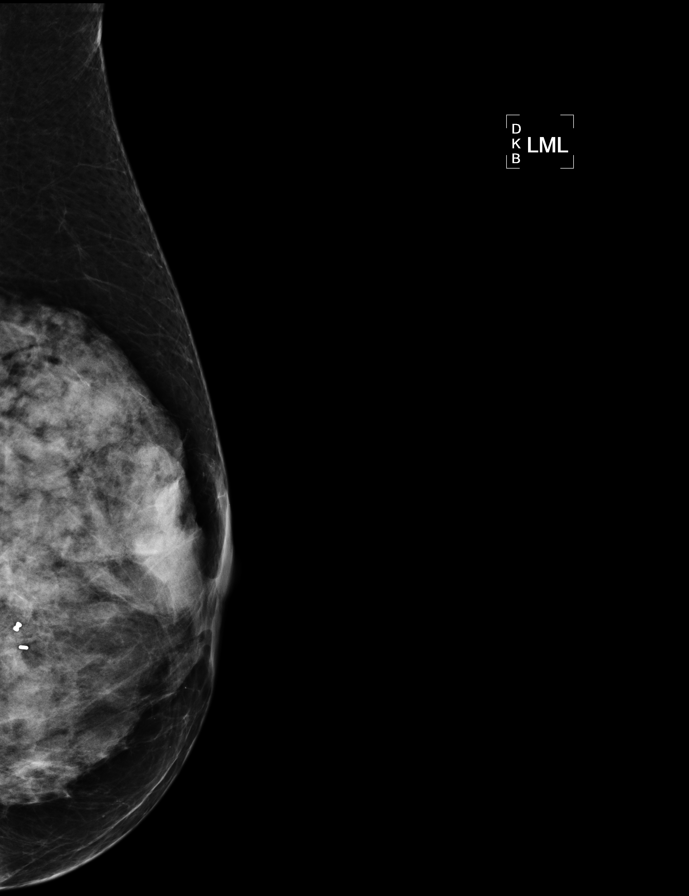

[7 of 7 positions shown; findings below may reference images not displayed]

FINDINGS: Patient presents for radioactive seed localization prior to surgical
excision of the left breast mass. I met with the patient and we
discussed the procedure of seed localization including benefits and
alternatives. We discussed the high likelihood of a successful
procedure. We discussed the risks of the procedure including
infection, bleeding, tissue injury and further surgery. We discussed
the low dose of radioactivity involved in the procedure. Informed,
written consent was given.

The usual time-out protocol was performed immediately prior to the
procedure.

Using mammographic guidance, sterile technique, 2% lidocaine and an
V-AKG radioactive seed, the dumbbell shaped biopsy clip was
localized using a medial approach. The follow-up mammogram images
confirm the seed in the expected location and are marked for Dr.
Tayane.

Follow-up survey of the patient confirms presence of the radioactive
seed.

Order number of V-AKG seed:  [id number].

Total activity:  0.250 mCi  Reference Date: 12/21/2014

The patient tolerated the procedure well and was released from the
[REDACTED]. She was given instructions regarding seed removal.
IMPRESSION: Radioactive seed localization left breast. No apparent
complications.

## 2017-05-15 ENCOUNTER — Encounter: Payer: Self-pay | Admitting: Neurology

## 2017-05-15 ENCOUNTER — Telehealth: Payer: Self-pay | Admitting: Neurology

## 2017-05-15 NOTE — Telephone Encounter (Signed)
Pt calling asking to make an appointment but no longer wants to see Dr Jannifer Franklin.  Pt would like to know if she can utilize her 1 time change from Dr Jannifer Franklin to Dr Felecia Shelling because of the great reviews she has heard about Dr Felecia Shelling.  Pt is needing to be seen  Started with tendinitis, for over 2 years. Pt also has vibrating from hip to legs to feet.  Pt feels it when she even sits.  Pt states she is not shaking, she states wherever she stands or lays she feels the vibrations in her feet.   The vibrations last for a day or 2 and leaves. Pt also has sever migraines on left side of head, she had numbness from time to time in her face.  Pt now states she is also willing to see Dr Brett Fairy, please call.

## 2017-05-15 NOTE — Telephone Encounter (Signed)
I have not seen the patient in 2 years, okay for transfer to another physician.

## 2017-05-15 NOTE — Telephone Encounter (Signed)
How did my name get pulled out of the hat? I concentrate on sleep- I don't  Prescribe Percocet, treat neuropathy  and non sleep.

## 2017-05-19 NOTE — Telephone Encounter (Signed)
Pt has called back in wanting it known that her Sarah Reeves is soon to expire and she will just call back once she has new insurance.  I informed pt that I do not have any input on who she will be seen by, I explained her that I simply relay messages.  Pt stated that she understood that and just asked that I have this message relayed that she will again call back once she has new insurance.

## 2017-05-19 NOTE — Telephone Encounter (Signed)
Noted  

## 2017-05-30 ENCOUNTER — Ambulatory Visit: Payer: 59 | Admitting: Neurology

## 2017-10-06 ENCOUNTER — Other Ambulatory Visit: Payer: Self-pay

## 2017-10-06 ENCOUNTER — Emergency Department (HOSPITAL_BASED_OUTPATIENT_CLINIC_OR_DEPARTMENT_OTHER)
Admission: EM | Admit: 2017-10-06 | Discharge: 2017-10-06 | Disposition: A | Discharge status: Home / Self Care | Payer: 59 | Attending: Emergency Medicine | Admitting: Emergency Medicine

## 2017-10-06 ENCOUNTER — Encounter (HOSPITAL_BASED_OUTPATIENT_CLINIC_OR_DEPARTMENT_OTHER): Payer: Self-pay | Admitting: Emergency Medicine

## 2017-10-06 DIAGNOSIS — Z79899 Other long term (current) drug therapy: Secondary | ICD-10-CM | POA: Diagnosis not present

## 2017-10-06 DIAGNOSIS — F17210 Nicotine dependence, cigarettes, uncomplicated: Secondary | ICD-10-CM | POA: Diagnosis not present

## 2017-10-06 DIAGNOSIS — R05 Cough: Secondary | ICD-10-CM | POA: Diagnosis not present

## 2017-10-06 DIAGNOSIS — Z91040 Latex allergy status: Secondary | ICD-10-CM | POA: Insufficient documentation

## 2017-10-06 DIAGNOSIS — J45909 Unspecified asthma, uncomplicated: Secondary | ICD-10-CM | POA: Diagnosis not present

## 2017-10-06 DIAGNOSIS — F410 Panic disorder [episodic paroxysmal anxiety] without agoraphobia: Secondary | ICD-10-CM | POA: Diagnosis present

## 2017-10-06 DIAGNOSIS — H93A9 Pulsatile tinnitus, unspecified ear: Secondary | ICD-10-CM | POA: Insufficient documentation

## 2017-10-06 LAB — BASIC METABOLIC PANEL
Anion gap: 7 (ref 5–15)
BUN: 11 mg/dL (ref 6–20)
CO2: 26 mmol/L (ref 22–32)
Calcium: 9.4 mg/dL (ref 8.9–10.3)
Chloride: 101 mmol/L (ref 101–111)
Creatinine, Ser: 0.51 mg/dL (ref 0.44–1.00)
GFR calc Af Amer: >60 mL/min (ref >60)
GFR calc non Af Amer: >60 mL/min (ref >60)
Glucose, Bld: 100 mg/dL — ABNORMAL HIGH (ref 65–99)
Potassium: 4 mmol/L (ref 3.5–5.1)
Sodium: 134 mmol/L — ABNORMAL LOW (ref 135–145)

## 2017-10-06 LAB — TROPONIN I: Troponin I: <0.03 ng/mL (ref <0.03)

## 2017-10-06 LAB — CBC WITH DIFFERENTIAL/PLATELET
Basophils Absolute: 0 10*3/uL (ref 0.0–0.1)
Basophils Relative: 0 %
Eosinophils Absolute: 0 10*3/uL (ref 0.0–0.7)
Eosinophils Relative: 0 %
HCT: 38.9 % (ref 36.0–46.0)
Hemoglobin: 13 g/dL (ref 12.0–15.0)
Lymphocytes Relative: 38 %
Lymphs Abs: 3.7 10*3/uL (ref 0.7–4.0)
MCH: 30.2 pg (ref 26.0–34.0)
MCHC: 33.4 g/dL (ref 30.0–36.0)
MCV: 90.3 fL (ref 78.0–100.0)
Monocytes Absolute: 0.6 10*3/uL (ref 0.1–1.0)
Monocytes Relative: 6 %
Neutro Abs: 5.3 10*3/uL (ref 1.7–7.7)
Neutrophils Relative %: 56 %
Platelets: 226 10*3/uL (ref 150–400)
RBC: 4.31 MIL/uL (ref 3.87–5.11)
RDW: 13.8 % (ref 11.5–15.5)
WBC: 9.6 10*3/uL (ref 4.0–10.5)

## 2017-10-06 NOTE — ED Triage Notes (Signed)
Patient reports that she had an allergic reaction to something the cleaning lady sprayed today.  States that she then began coughing and having a panic attack and states that she now feels as thought she has a "blocked artery" on the right side of her neck.  Further states "I can feel the vein pulsing".  Reports that she took 1mg  of ativan and 2 baby aspirin prior to arrival.

## 2017-10-06 NOTE — Discharge Instructions (Signed)
Continue your current meds.   See your doctor for follow up.   Consider following up with your ENT for your ringing in your ears and neurologist if you have weakness or numbness.   Return to ER if you feel your neck is more swollen and tender, persistent pulsations in your neck, trouble speaking, weakness, numbness.

## 2017-10-06 NOTE — ED Provider Notes (Signed)
[facility] DEPART[facility] Provider Note   CSN: [id number] Arrival date & time: 10/06/17  1601     History   Chief Complaint Chief Complaint  Patient presents with  . Anxiety    HPI Sarah Reeves is a 56 y.o. female history of fibromyalgia, hyperlipidemia, chronic neuropathy here presenting with possible panic attack, possible pulsation in the right side of her neck.  Patient states that she had a cleaning lady and use a solution to clean her house.  She started coughing and had a panic attack and then she had pulsations in the right side of her neck.  She was concerned that she may have a blocked artery or a pulsating vein.  Patient denies any chest pains.  Patient took Ativan and 2 baby aspirin and felt better.  Patient states that she has chronic ringing in her ears and had seen ENT in the past but no particular diagnosis.  Patient also has sensation of vibration in the bottom of her toes for the last several weeks but has not seen her neurologist for it.  Of note, patient does see a neurologist.  She also has history of fibromyalgia.  Moreover, patient did had a previous MRI that showed no previous strokes and had multiple US carotid that showed no significant atherosclerosis.   The history is provided by the patient.    Past Medical History:  Diagnosis Date  . Anxiety   . Asthma   . Complication of anesthesia    takes alot to sedated  . Depression   . Fibromyalgia   . GERD (gastroesophageal reflux disease)   . Hepatitis C    (Dr. Earlean Shawl) Treated with Harvoni March-May 2016  . Herpes simplex   . Hyperlipemia   . Insomnia   . Mental disorder   . Neuropathy   . PONV (postoperative nausea and vomiting)     Patient Active Problem List   Diagnosis Date Noted  . Tobacco abuse counseling 07/25/2015  . Chest pain with moderate risk for cardiac etiology 06/26/2015  . Dizziness of unknown cause 06/26/2015  . Hyperglycemia 06/26/2015  . Paresthesia  06/20/2015  . Suicidal ideation 06/06/2013  . MDD (major depressive disorder), recurrent episode, severe ([place]) 06/06/2013  . Alcohol addiction ([place]) 04/30/2013  . HEPATITIS C 06/30/2007  . ALLERGIC RHINITIS 06/30/2007  . ASTHMA 06/30/2007  . GERD 06/30/2007    Past Surgical History:  Procedure Laterality Date  . BREAST ENHANCEMENT SURGERY    . BREAST LUMPECTOMY WITH RADIOACTIVE SEED LOCALIZATION Left 12/26/2014   Procedure: LEFT BREAST LUMPECTOMY WITH RADIOACTIVE SEED LOCALIZATION;  Surgeon: Autumn Messing III, MD;  Location: [facility];  Service: General;  Laterality: Left;  . BREAST REDUCTION SURGERY    . COLONOSCOPY    . EXPLORATORY LAPAROTOMY    . NM MYOVIEW LTD  07/07/15   Normal Myocardial Perfusion Scan. Low risk lexiscan nuclear study with minimal insignificant breast attenuation and normal myocardial perfusion and function; EF 53% without wall motion abnormalities and normal systolic thickening  . TEMPOROMANDIBULAR JOINT SURGERY    . TUBAL LIGATION    . UPPER GI ENDOSCOPY     x5    OB History    No data available       Home Medications    Prior to Admission medications   Medication Sig Start Date End Date Taking? Authorizing Provider  Ascorbic Acid (VITAMIN C) 1000 MG tablet Take 1,000 mg by mouth daily.    [provider]  calcium carbonate 1250 MG capsule Take 1,250 mg by mouth 2 (two) times daily with a meal.    [provider]  cetirizine (ZYRTEC) 10 MG tablet Take 1 tablet (10 mg total) by mouth daily. 06/16/13   Ruben Im, PA-C  cholecalciferol (VITAMIN D) 1000 UNITS tablet Take 1,000 Units by mouth daily.    [provider]  cyclobenzaprine (FLEXERIL) 10 MG tablet Take 10 mg by mouth 3 (three) times daily as needed for muscle spasms.    [provider]  esomeprazole (NEXIUM) 40 MG capsule Take 40 mg by mouth daily as needed (ACID REFLUX AND GERD).    [provider]  estradiol (VIVELLE-DOT) 0.075  MG/24HR Place 1 patch onto the skin 2 (two) times a week.    [provider]  fluticasone (FLONASE) 50 MCG/ACT nasal spray Place 1 spray into both nostrils daily.     [provider]  LORazepam (ATIVAN) 1 MG tablet Take 1 mg by mouth 2 (two) times daily.    [provider]  magnesium oxide (MAG-OX) 400 MG tablet Take 400 mg by mouth daily.    [provider]  nitroGLYCERIN (NITROSTAT) 0.4 MG SL tablet Place 1 tablet (0.4 mg total) under the tongue every 5 (five) minutes as needed for chest pain. 07/26/15   Leonie Man, MD  oxyCODONE (OXY IR/ROXICODONE) 5 MG immediate release tablet Take 5 mg by mouth every 4 (four) hours as needed for severe pain.    [provider]  oxyCODONE-acetaminophen (PERCOCET/ROXICET) 5-325 MG tablet Take 1-2 tablets by mouth every 4 (four) hours as needed for moderate pain or severe pain.    [provider]  pregabalin (LYRICA) 50 MG capsule Take 1 capsule (50 mg total) by mouth 2 (two) times daily. 09/01/15   Kathrynn Ducking, MD  progesterone (ENDOMETRIN) 100 MG vaginal insert Place 100 mg vaginally at bedtime.    [provider]  valACYclovir (VALTREX) 500 MG tablet Take 500 mg by mouth 2 (two) times daily as needed (HERPES).    [provider]  zolpidem (AMBIEN) 10 MG tablet Take 10 mg by mouth at bedtime as needed for sleep.    [provider]    Family History Family History  Problem Relation Age of Onset  . Diabetes Mother   . Lung disease Mother   . Heart disease Mother   . Breast cancer Mother   . Heart disease Father   . Colon cancer Father   . Hypothyroidism Brother   . Alzheimer's disease Maternal Aunt   . Skin cancer Maternal Aunt   . Alzheimer's disease Maternal Grandmother     Social History Social History   Tobacco Use  . Smoking status: Current Every Day Smoker    Packs/day: 1.00    Types: Cigarettes  . Smokeless tobacco: Never Used  Substance Use Topics   . Alcohol use: No    Comment: few times a week  . Drug use: No    Comment: did have hx drug abuse as teen     Allergies   Oysters [shellfish allergy]; Zoloft [sertraline hcl]; and Latex   Review of Systems Review of Systems  HENT:       Neck pulsations   All other systems reviewed and are negative.    Physical Exam Updated Vital Signs BP (!) 150/86   Pulse 89   Temp 98.4 F (36.9 C) (Oral)   Resp 18   Ht 5\' 4"  (1.626 m)  Wt 54.4 kg (120 lb)   SpO2 100%   BMI 20.60 kg/m   Physical Exam  Constitutional: She is oriented to person, place, and time. She appears well-developed.  Anxious   HENT:  Head: Normocephalic.  Right Ear: External ear normal.  Left Ear: External ear normal.  Mouth/Throat: Oropharynx is clear and moist.  Eyes: Conjunctivae and EOM are normal. Pupils are equal, round, and reactive to light.  Neck: Normal range of motion. Neck supple.  No obvious pulsatile mass, nl ROM of the neck   Cardiovascular: Normal rate, regular rhythm and normal heart sounds.  Pulmonary/Chest: Effort normal and breath sounds normal. No stridor. No respiratory distress.  Abdominal: Soft. Bowel sounds are normal. She exhibits no distension. There is no tenderness.  Musculoskeletal: Normal range of motion.  Neurological: She is alert and oriented to person, place, and time. No cranial nerve deficit. Coordination normal.  CN 2-12 intact, nl strength and sensation throughout   Skin: Skin is warm.  Psychiatric: She has a normal mood and affect.  Nursing note and vitals reviewed.    ED Treatments / Results  Labs (all labs ordered are listed, but only abnormal results are displayed) Labs Reviewed  BASIC METABOLIC PANEL - Abnormal; Notable for the following components:      Result Value   Sodium 134 (*)    Glucose, Bld 100 (*)    All other components within normal limits  CBC WITH DIFFERENTIAL/PLATELET  TROPONIN I    EKG  EKG Interpretation  Date/Time:  Monday  October 06 2017 17:27:18 EST Ventricular Rate:  67 PR Interval:    QRS Duration: 90 QT Interval:  391 QTC Calculation: 413 R Axis:   58 Text Interpretation:  Sinus rhythm No previous ECGs available Confirmed by Wandra Arthurs [phone]) on 10/06/2017 5:38:00 PM       Radiology No results found.  Procedures Procedures (including critical care time)  EMERGENCY DEPARTMENT US Carotid INTERPRETATION "Study: Limited Soft Tissue Ultrasound"  INDICATIONS: pulsations in neck Multiple views of the body part were obtained in real-time with a multi-frequency linear probe  PERFORMED BY: Myself IMAGES ARCHIVED?: Yes SIDE:Left and Right  BODY PART:Neck INTERPRETATION:  no obvious carotid dissection, minimal atherosclerosis      Medications Ordered in ED Medications - No data to display   Initial Impression / Assessment and Plan / ED Course  I have reviewed the triage vital signs and the nursing notes.  Pertinent labs & imaging results that were available during my care of the patient were reviewed by me and considered in my medical decision making (see chart for details).     TASHINA CREDIT is a 56 y.o. female here with pulsations of the right neck after exposed to cleaning solution. She has no obvious allergic reaction. I think likely panic attack. Has hx of anxiety and fibromyalgia. I performed bedside US that showed no obvious dissection and minimal atherosclerosis with no significant obstruction. She had multiple US carotid that showed similar. She has nl labs and EKG in the ED. No chest pain to suggest ACS or PE. She is stable for discharge at this point. She can follow up with PCP outpatient.    Final Clinical Impressions(s) / ED Diagnoses   Final diagnoses:  None    ED Discharge Orders    None       Drenda Freeze, MD 10/06/17 1744

## 2017-10-17 ENCOUNTER — Ambulatory Visit: Payer: 59 | Admitting: Physician Assistant

## 2017-10-22 DIAGNOSIS — Z0000 Encounter for general adult medical examination without abnormal findings: Secondary | ICD-10-CM | POA: Diagnosis not present

## 2017-10-22 DIAGNOSIS — F410 Panic disorder [episodic paroxysmal anxiety] without agoraphobia: Secondary | ICD-10-CM | POA: Diagnosis not present

## 2017-10-22 DIAGNOSIS — F411 Generalized anxiety disorder: Secondary | ICD-10-CM | POA: Diagnosis not present

## 2017-10-22 DIAGNOSIS — H9311 Tinnitus, right ear: Secondary | ICD-10-CM | POA: Diagnosis not present

## 2017-10-27 ENCOUNTER — Ambulatory Visit: Payer: 59 | Admitting: Family Medicine

## 2017-10-27 DIAGNOSIS — F410 Panic disorder [episodic paroxysmal anxiety] without agoraphobia: Secondary | ICD-10-CM | POA: Insufficient documentation

## 2017-10-27 DIAGNOSIS — E559 Vitamin D deficiency, unspecified: Secondary | ICD-10-CM | POA: Insufficient documentation

## 2017-10-27 DIAGNOSIS — M797 Fibromyalgia: Secondary | ICD-10-CM | POA: Insufficient documentation

## 2017-10-28 DIAGNOSIS — Z6821 Body mass index (BMI) 21.0-21.9, adult: Secondary | ICD-10-CM | POA: Diagnosis not present

## 2017-10-28 DIAGNOSIS — Z01419 Encounter for gynecological examination (general) (routine) without abnormal findings: Secondary | ICD-10-CM | POA: Diagnosis not present

## 2017-10-28 DIAGNOSIS — Z1231 Encounter for screening mammogram for malignant neoplasm of breast: Secondary | ICD-10-CM | POA: Diagnosis not present

## 2017-10-30 ENCOUNTER — Ambulatory Visit: Payer: 59 | Admitting: Family Medicine

## 2017-10-31 ENCOUNTER — Ambulatory Visit: Payer: 59 | Admitting: Physician Assistant

## 2018-01-14 DIAGNOSIS — H9319 Tinnitus, unspecified ear: Secondary | ICD-10-CM | POA: Diagnosis not present

## 2018-01-14 DIAGNOSIS — N951 Menopausal and female climacteric states: Secondary | ICD-10-CM | POA: Diagnosis not present

## 2018-01-14 DIAGNOSIS — G250 Essential tremor: Secondary | ICD-10-CM | POA: Diagnosis not present

## 2018-01-20 DIAGNOSIS — H903 Sensorineural hearing loss, bilateral: Secondary | ICD-10-CM | POA: Diagnosis not present

## 2018-01-20 DIAGNOSIS — R42 Dizziness and giddiness: Secondary | ICD-10-CM | POA: Diagnosis not present

## 2018-01-20 DIAGNOSIS — H9313 Tinnitus, bilateral: Secondary | ICD-10-CM | POA: Diagnosis not present

## 2018-01-22 ENCOUNTER — Telehealth: Payer: Self-pay | Admitting: Neurology

## 2018-01-22 NOTE — Telephone Encounter (Signed)
Good morning. We received a referral for this pt for tinnitus, nerve pain and headaches. She last saw Dr. Jannifer Franklin back in Sep of 2016, but is requesting to switch to a female provider. Would both of you be ok with this switch?

## 2018-01-22 NOTE — Telephone Encounter (Signed)
Ok to switch 

## 2018-01-22 NOTE — Telephone Encounter (Signed)
Okay for switch

## 2018-01-28 ENCOUNTER — Encounter (INDEPENDENT_AMBULATORY_CARE_PROVIDER_SITE_OTHER): Payer: Self-pay

## 2018-01-28 ENCOUNTER — Encounter: Payer: Self-pay | Admitting: Neurology

## 2018-01-28 ENCOUNTER — Ambulatory Visit: Payer: BLUE CROSS/BLUE SHIELD | Admitting: Neurology

## 2018-01-28 VITALS — BP 122/70 | HR 88 | Ht 64.0 in | Wt 124.0 lb

## 2018-01-28 DIAGNOSIS — R251 Tremor, unspecified: Secondary | ICD-10-CM

## 2018-01-28 DIAGNOSIS — R2689 Other abnormalities of gait and mobility: Secondary | ICD-10-CM | POA: Insufficient documentation

## 2018-01-28 NOTE — Progress Notes (Signed)
PATIENT: Sarah Reeves DOB: [birth year]  Chief Complaint  Patient presents with  . Neurological Symptoms    She is here with her friend/neighbor. She has multiple neurological concerns: low back pain radiating to left groin/leg, vibrations in lower extremities, internal tremors, headaches, tinnitus.  She has failed Elavil in the past.  Currently, taking Lyrica for pain and Topamax for headaches.  She is concerned about MS or Parkinsons.  Her biological mother had Parkison's Disease.  [facility]    Dian Queen, MD - referring provider (no PCP)     HISTORICAL  Sarah Reeves is a 57 years old female, seen in refer by OB/GYN Dr. Dian Queen, for evaluation of constellation of complaints, initial evaluation was on January 28, 2018.  She is accompanied by her neighbor at today's visit.  She has a long history of anxiety, also carries a diagnosis of fibromyalgia, is on polypharmacy treatment, Ambien, Xanax, Lyrica,  In early 2016, she woke up one morning using bathroom, felt unsteady gait, as if her body was improved by magnetic to the right side, unsteady gait, shortly afterwards, she also developed bilateral tinnitus, numbness of the right face, increased headache more on the right side, she was evaluated by ENT, no abnormality was found, was referred to MRI of the brain in July 2016, that was normal, her symptoms gradually recovered over a few months.  Since 2017, she also began to experience intermittent body tremors sensation, as if electricity is running through her body, she felt her legs vibrate when sitting down, intermittent, but denies gait abnormality,  She also complains of frequent panic attacks, anxiety, is afraid to be left alone, her maternal mother and maternal grandmother suffered Parkinson's disease, she is worried that her above-mentioned symptoms might represent Parkinson's disease or MS.  MRI of lumbar in October 2016, mild multilevel degenerative disc disease but  there is no significant foraminal canal stenosis.  MRI of the brain in July 2016 was normal  Mild cardiac effusion study in September 2016, mildly decreased left ventricular ejection fraction 45 -54%, normal myocardial perfusion and function  Ultrasound of carotid artery showed less than 50% stenosis bilaterally  I have reviewed her controlled substance registry, she is on polypharmacy treatment, include Ambien 10 mg daily, lorazepam 1 mg twice a day, Lyrica 75 mg twice a day  Laboratory evaluations in January 2019, normal CBC with hemoglobin of 14.1, normal CMP, creatinine of 0.68, LDL was 121, total cholesterol was 228 normal TSH 1.39, negative urine drug screen, vitamin D level was 43,  REVIEW OF SYSTEMS: Full 14 system review of systems performed and notable only for weight loss, fatigue, ringing the ears, trouble swallowing, blurred vision, double vision, constipation, urination problems.  Increased thirst, flushing, joint pain, cramps, achy muscles, allergy, runny nose, skin sensitivity, headaches, numbness, dizziness, tremor, insomnia, snoring, restless leg, depression, anxiety, not enough sleep, decreased energy, change in appetite, disinterested in activities, suicidal cells, hallucinations she is accompanied by her neighbor  ALLERGIES: Allergies  Allergen Reactions  . Oysters [Shellfish Allergy] Other (See Comments)    Unknown reaction; "MD told me I was allergic"  . Zoloft [Sertraline Hcl] Other (See Comments)    REACTION: Nightmares, Grind teeth really bad  . Latex Itching    Itch and blisters from contact  . Amitriptyline     Hot, itching, tongue tingling, breakthrough bleeding.  . Topamax [Topiramate]     Says it makes her "constipation and she can't urinate normally"  HOME MEDICATIONS: Current Outpatient Medications  Medication Sig Dispense Refill  . Ascorbic Acid (VITAMIN C) 1000 MG tablet Take 1,000 mg by mouth daily.    . calcium carbonate 1250 MG capsule Take  1,250 mg by mouth 2 (two) times daily with a meal.    . cetirizine (ZYRTEC) 10 MG tablet Take 1 tablet (10 mg total) by mouth daily.    . cholecalciferol (VITAMIN D) 1000 UNITS tablet Take 1,000 Units by mouth daily.    . cyclobenzaprine (FLEXERIL) 10 MG tablet Take 10 mg by mouth 3 (three) times daily as needed for muscle spasms.    [facility] esomeprazole (NEXIUM) 40 MG capsule Take 40 mg by mouth daily as needed (ACID REFLUX AND GERD).    [facility] estradiol (VIVELLE-DOT) 0.075 MG/24HR Place 1 patch onto the skin 2 (two) times a week.    . fluticasone (FLONASE) 50 MCG/ACT nasal spray Place 1 spray into both nostrils daily.     [facility] Kitchen LORazepam (ATIVAN) 1 MG tablet Take 1 mg by mouth 2 (two) times daily.    . magnesium oxide (MAG-OX) 400 MG tablet Take 400 mg by mouth daily.    . nitroGLYCERIN (NITROSTAT) 0.4 MG SL tablet Place 1 tablet (0.4 mg total) under the tongue every 5 (five) minutes as needed for chest pain. 25 tablet 12  . oxyCODONE (OXY IR/ROXICODONE) 5 MG immediate release tablet Take 5 mg by mouth every 4 (four) hours as needed for severe pain.    [facility] Kitchen oxyCODONE-acetaminophen (PERCOCET/ROXICET) 5-325 MG tablet Take 1-2 tablets by mouth every 4 (four) hours as needed for moderate pain or severe pain.    . pregabalin (LYRICA) 50 MG capsule Take 1 capsule (50 mg total) by mouth 2 (two) times daily. 60 capsule 1  . progesterone (ENDOMETRIN) 100 MG vaginal insert Place 100 mg vaginally at bedtime.    . valACYclovir (VALTREX) 500 MG tablet Take 500 mg by mouth 2 (two) times daily as needed (HERPES).    [facility] zolpidem (AMBIEN) 10 MG tablet Take 10 mg by mouth at bedtime as needed for sleep.     No current facility-administered medications for this visit.    Facility-Administered Medications Ordered in Other Visits  Medication Dose Route Frequency Provider Last Rate Last Dose  . chlorhexidine (HIBICLENS) 4 % liquid 1 application  1 application Topical Once Autumn Messing III, MD      . chlorhexidine (HIBICLENS) 4 %  liquid 1 application  1 application Topical Once Jovita Kussmaul, MD        PAST MEDICAL HISTORY: Past Medical History:  Diagnosis Date  . Anxiety   . Asthma   . Complication of anesthesia    takes alot to sedated  . Depression   . Fibromyalgia   . GERD (gastroesophageal reflux disease)   . Hepatitis C    (Dr. Earlean Shawl) Treated with Harvoni March-May 2016  . Herpes simplex   . Hyperlipemia   . Insomnia   . Mental disorder   . Neuropathy   . PONV (postoperative nausea and vomiting)     PAST SURGICAL HISTORY: Past Surgical History:  Procedure Laterality Date  . BREAST ENHANCEMENT SURGERY    . BREAST LUMPECTOMY WITH RADIOACTIVE SEED LOCALIZATION Left 12/26/2014   Procedure: LEFT BREAST LUMPECTOMY WITH RADIOACTIVE SEED LOCALIZATION;  Surgeon: Autumn Messing III, MD;  Location: [facility];  Service: General;  Laterality: Left;  . BREAST REDUCTION SURGERY    . COLONOSCOPY    . EXPLORATORY LAPAROTOMY    .  NM MYOVIEW LTD  07/07/15   Normal Myocardial Perfusion Scan. Low risk lexiscan nuclear study with minimal insignificant breast attenuation and normal myocardial perfusion and function; EF 53% without wall motion abnormalities and normal systolic thickening  . TEMPOROMANDIBULAR JOINT SURGERY    . TUBAL LIGATION    . UPPER GI ENDOSCOPY     x5    FAMILY HISTORY: Family History  Problem Relation Age of Onset  . Diabetes Mother   . Lung disease Mother   . Heart disease Mother   . Breast cancer Mother   . Heart disease Father   . Colon cancer Father   . Hypothyroidism Brother   . Alzheimer's disease Maternal Aunt   . Skin cancer Maternal Aunt   . Alzheimer's disease Maternal Grandmother     SOCIAL HISTORY:  Social History   Socioeconomic History  . Marital status: Married    Spouse name: Not on file  . Number of children: 0  . Years of education: 39  . Highest education level: Not on file  Occupational History  . Occupation: Retired  Scientific laboratory technician  .  Financial resource strain: Not on file  . Food insecurity:    Worry: Not on file    Inability: Not on file  . Transportation needs:    Medical: Not on file    Non-medical: Not on file  Tobacco Use  . Smoking status: Current Every Day Smoker    Packs/day: 0.50    Types: Cigarettes  . Smokeless tobacco: Never Used  Substance and Sexual Activity  . Alcohol use: No  . Drug use: No    Comment: did have hx drug abuse as teen  . Sexual activity: Yes    Birth control/protection: Surgical  Lifestyle  . Physical activity:    Days per week: Not on file    Minutes per session: Not on file  . Stress: Not on file  Relationships  . Social connections:    Talks on phone: Not on file    Gets together: Not on file    Attends religious service: Not on file    Active member of club or organization: Not on file    Attends meetings of clubs or organizations: Not on file    Relationship status: Not on file  . Intimate partner violence:    Fear of current or ex partner: Not on file    Emotionally abused: Not on file    Physically abused: Not on file    Forced sexual activity: Not on file  Other Topics Concern  . Not on file  Social History Narrative   Patient is right handed.   Four cups caffeine per day.   Lives at home with husband.     PHYSICAL EXAM   Vitals:   01/28/18 0733  BP: 122/70  Pulse: 88  Weight: 124 lb (56.2 kg)  Height: 5\' 4"  (1.626 m)    Not recorded      Body mass index is 21.28 kg/m.  PHYSICAL EXAMNIATION:  Gen: NAD, conversant, well nourised, obese, well groomed                     Cardiovascular: Regular rate rhythm, no peripheral edema, warm, nontender. Eyes: Conjunctivae clear without exudates or hemorrhage Neck: Supple, no carotid bruits. Pulmonary: Clear to auscultation bilaterally   NEUROLOGICAL EXAM:  MENTAL STATUS: Speech:    Speech is normal; fluent and spontaneous with normal comprehension.  Cognition:     Orientation to  time, place and  person     Normal recent and remote memory     Normal Attention span and concentration     Normal Language, naming, repeating,spontaneous speech     Fund of knowledge   CRANIAL NERVES: CN II: Visual fields are full to confrontation. Fundoscopic exam is normal with sharp discs and no vascular changes. Pupils are round equal and briskly reactive to light. CN III, IV, VI: extraocular movement are normal. No ptosis. CN V: Facial sensation is intact to pinprick in all 3 divisions bilaterally. Corneal responses are intact.  CN VII: Face is symmetric with normal eye closure and smile. CN VIII: Hearing is normal to rubbing fingers CN IX, X: Palate elevates symmetrically. Phonation is normal. CN XI: Head turning and shoulder shrug are intact CN XII: Tongue is midline with normal movements and no atrophy.  MOTOR: There is no pronator drift of out-stretched arms. Muscle bulk and tone are normal. Muscle strength is normal.  REFLEXES: Reflexes are 2+ and symmetric at the biceps, triceps, knees, and ankles. Plantar responses are flexor.  SENSORY: Intact to light touch, pinprick, positional sensation and vibratory sensation are intact in fingers and toes.  COORDINATION: Rapid alternating movements and fine finger movements are intact. There is no dysmetria on finger-to-nose and heel-knee-shin.    GAIT/STANCE: Posture is normal. Gait is steady with normal steps, base, arm swing, and turning. Heel and toe walking are normal. Tandem gait is normal.  Romberg is absent.   DIAGNOSTIC DATA (LABS, IMAGING, TESTING) - I reviewed patient records, labs, notes, testing and imaging myself where available.   ASSESSMENT AND PLAN  Sarah Reeves is a 57 y.o. female   Constellation of complaints,  Extensive neurological evaluations in the past was normal  Essentially normal neurological examinations,  I think her anxiety, frequent panic attacks play a major role in her complaints, have encouraged her  continue her psychotherapy, continue follow-up with her primary care, psychiatrist.  There is no evidence of multiple sclerosis or Parkinson's disease   Marcial Pacas, M.D. Ph.D.  [facility] [address], [place], [place] [address] Ph: [phone] Fax: [phone]  CC: Dian Queen, MD

## 2018-04-20 DIAGNOSIS — J019 Acute sinusitis, unspecified: Secondary | ICD-10-CM | POA: Diagnosis not present

## 2018-04-20 DIAGNOSIS — N390 Urinary tract infection, site not specified: Secondary | ICD-10-CM | POA: Diagnosis not present

## 2018-06-25 ENCOUNTER — Emergency Department (HOSPITAL_COMMUNITY)
Admission: EM | Admit: 2018-06-25 | Discharge: 2018-06-26 | Disposition: A | Discharge status: Other Acute Inpatient Hospital | Payer: BLUE CROSS/BLUE SHIELD | Attending: Emergency Medicine | Admitting: Emergency Medicine

## 2018-06-25 ENCOUNTER — Encounter (HOSPITAL_COMMUNITY): Payer: Self-pay | Admitting: Obstetrics and Gynecology

## 2018-06-25 ENCOUNTER — Other Ambulatory Visit: Payer: Self-pay

## 2018-06-25 DIAGNOSIS — F419 Anxiety disorder, unspecified: Secondary | ICD-10-CM | POA: Insufficient documentation

## 2018-06-25 DIAGNOSIS — Z91040 Latex allergy status: Secondary | ICD-10-CM | POA: Insufficient documentation

## 2018-06-25 DIAGNOSIS — F329 Major depressive disorder, single episode, unspecified: Secondary | ICD-10-CM | POA: Diagnosis not present

## 2018-06-25 DIAGNOSIS — F333 Major depressive disorder, recurrent, severe with psychotic symptoms: Secondary | ICD-10-CM

## 2018-06-25 DIAGNOSIS — R45851 Suicidal ideations: Secondary | ICD-10-CM | POA: Diagnosis not present

## 2018-06-25 DIAGNOSIS — F32A Depression, unspecified: Secondary | ICD-10-CM

## 2018-06-25 DIAGNOSIS — F17210 Nicotine dependence, cigarettes, uncomplicated: Secondary | ICD-10-CM | POA: Insufficient documentation

## 2018-06-25 DIAGNOSIS — Z79899 Other long term (current) drug therapy: Secondary | ICD-10-CM | POA: Insufficient documentation

## 2018-06-25 DIAGNOSIS — F4310 Post-traumatic stress disorder, unspecified: Secondary | ICD-10-CM

## 2018-06-25 DIAGNOSIS — F418 Other specified anxiety disorders: Secondary | ICD-10-CM | POA: Diagnosis not present

## 2018-06-25 DIAGNOSIS — M797 Fibromyalgia: Secondary | ICD-10-CM | POA: Diagnosis not present

## 2018-06-25 DIAGNOSIS — F411 Generalized anxiety disorder: Secondary | ICD-10-CM | POA: Diagnosis not present

## 2018-06-25 LAB — RAPID URINE DRUG SCREEN, HOSP PERFORMED
Amphetamines: NOT DETECTED
Barbiturates: NOT DETECTED
Benzodiazepines: NOT DETECTED
Cocaine: NOT DETECTED
Opiates: NOT DETECTED
Tetrahydrocannabinol: NOT DETECTED

## 2018-06-25 LAB — I-STAT BETA HCG BLOOD, ED (MC, WL, AP ONLY): I-stat hCG, quantitative: <5 m[IU]/mL (ref <5)

## 2018-06-25 LAB — COMPREHENSIVE METABOLIC PANEL
ALT: 11 U/L (ref 0–44)
AST: 16 U/L (ref 15–41)
Albumin: 4.5 g/dL (ref 3.5–5.0)
Alkaline Phosphatase: 43 U/L (ref 38–126)
Anion gap: 9 (ref 5–15)
BUN: 8 mg/dL (ref 6–20)
CO2: 27 mmol/L (ref 22–32)
Calcium: 9.3 mg/dL (ref 8.9–10.3)
Chloride: 101 mmol/L (ref 98–111)
Creatinine, Ser: 0.62 mg/dL (ref 0.44–1.00)
GFR calc Af Amer: >60 mL/min (ref >60)
GFR calc non Af Amer: >60 mL/min (ref >60)
Glucose, Bld: 99 mg/dL (ref 70–99)
Potassium: 4.1 mmol/L (ref 3.5–5.1)
Sodium: 137 mmol/L (ref 135–145)
Total Bilirubin: 0.3 mg/dL (ref 0.3–1.2)
Total Protein: 7.2 g/dL (ref 6.5–8.1)

## 2018-06-25 LAB — CBC
HCT: 41.3 % (ref 36.0–46.0)
Hemoglobin: 13.8 g/dL (ref 12.0–15.0)
MCH: 30.1 pg (ref 26.0–34.0)
MCHC: 33.4 g/dL (ref 30.0–36.0)
MCV: 90.2 fL (ref 78.0–100.0)
Platelets: 302 10*3/uL (ref 150–400)
RBC: 4.58 MIL/uL (ref 3.87–5.11)
RDW: 14 % (ref 11.5–15.5)
WBC: 8.7 10*3/uL (ref 4.0–10.5)

## 2018-06-25 LAB — ETHANOL: Alcohol, Ethyl (B): <10 mg/dL (ref <10)

## 2018-06-25 LAB — SALICYLATE LEVEL: Salicylate Lvl: <7 mg/dL (ref 2.8–30.0)

## 2018-06-25 LAB — ACETAMINOPHEN LEVEL: Acetaminophen (Tylenol), Serum: <10 ug/mL — ABNORMAL LOW (ref 10–30)

## 2018-06-25 MED ORDER — MAGNESIUM OXIDE 400 (241.3 MG) MG PO TABS
400.0000 mg | ORAL_TABLET | Freq: Every day | ORAL | Status: DC
  Administered 2018-06-26: 400 mg via ORAL
  Filled 2018-06-25: qty 1

## 2018-06-25 MED ORDER — LORATADINE 10 MG PO TABS
10.0000 mg | ORAL_TABLET | Freq: Every day | ORAL | Status: DC
  Administered 2018-06-26: 10 mg via ORAL
  Filled 2018-06-25: qty 1

## 2018-06-25 MED ORDER — VITAMIN D3 25 MCG (1000 UNIT) PO TABS
2000.0000 [IU] | ORAL_TABLET | Freq: Every day | ORAL | Status: DC
  Administered 2018-06-26: 2000 [IU] via ORAL
  Filled 2018-06-25: qty 2

## 2018-06-25 MED ORDER — GABAPENTIN 100 MG PO CAPS: 200.0000 mg | ORAL_CAPSULE | Freq: Every day | ORAL | Status: DC | PRN

## 2018-06-25 MED ORDER — ESTRADIOL 0.075 MG/24HR TD PTWK
MEDICATED_PATCH | TRANSDERMAL | Status: DC
  Administered 2018-06-26: 0.075 mg via TRANSDERMAL
  Filled 2018-06-25: qty 1

## 2018-06-25 MED ORDER — PROGESTERONE 100 MG VA INST: 100.0000 mg | VAGINAL_INSERT | Freq: Every day | VAGINAL | Status: DC

## 2018-06-25 MED ORDER — PROMETHAZINE HCL 25 MG PO TABS: 25.0000 mg | ORAL_TABLET | Freq: Two times a day (BID) | ORAL | Status: DC | PRN

## 2018-06-25 MED ORDER — VALACYCLOVIR HCL 500 MG PO TABS: 500.0000 mg | ORAL_TABLET | Freq: Two times a day (BID) | ORAL | Status: DC | PRN

## 2018-06-25 MED ORDER — PREGABALIN 50 MG PO CAPS
150.0000 mg | ORAL_CAPSULE | Freq: Every day | ORAL | Status: DC
  Administered 2018-06-25 – 2018-06-26 (×2): 150 mg via ORAL
  Filled 2018-06-25 (×2): qty 3

## 2018-06-25 MED ORDER — ZOLPIDEM TARTRATE 10 MG PO TABS: 10.0000 mg | ORAL_TABLET | Freq: Every evening | ORAL | Status: DC | PRN

## 2018-06-25 MED ORDER — VITAMIN C 500 MG PO TABS
1000.0000 mg | ORAL_TABLET | Freq: Every day | ORAL | Status: DC
  Administered 2018-06-26: 1000 mg via ORAL
  Filled 2018-06-25: qty 2

## 2018-06-25 MED ORDER — OLANZAPINE 5 MG PO TABS
5.0000 mg | ORAL_TABLET | Freq: Every day | ORAL | Status: DC
  Administered 2018-06-25: 5 mg via ORAL
  Filled 2018-06-25: qty 1

## 2018-06-25 MED ORDER — HYDROCODONE-ACETAMINOPHEN 10-325 MG PO TABS: 1.0000 | ORAL_TABLET | Freq: Four times a day (QID) | ORAL | Status: DC | PRN

## 2018-06-25 MED ORDER — LORAZEPAM 1 MG PO TABS: 2.0000 mg | ORAL_TABLET | Freq: Every day | ORAL | Status: DC | PRN
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
## 2018-06-25 NOTE — ED Notes (Signed)
Bed: IOE70 Expected date:  Expected time:  Means of arrival:  Comments: Tr 3

## 2018-06-25 NOTE — ED Notes (Signed)
Patient wanded by security. 

## 2018-06-25 NOTE — ED Notes (Signed)
Pt oriented to unit. Pt denies SI, HI, AVH. Pt reports suicidal gesture about 1 week ago of putting on multiple pain patches that were prescribed to her husband. Pt states her husband found her "gurgling" but let her "sleep for 3 days". Pt denies any suicidal gestures since then, but she reports telling her husband if she didn't get help for her depression she would "jump in front of a car". Pt denies such thoughts at present because she "feels safe here". Pt contracts for safety and appears to be in no distress. Pt is tearful at times during assessment.

## 2018-06-25 NOTE — ED Triage Notes (Signed)
Pt reports she used her husbands fentanyl patches last week sometime(pt reports she used 5 patches of 25mg ). Pt report she took some pills and put the heating packs on her stomach.  Pt reports her husband is 60 and she tried to take care of him and is overwhelmed and depressed. Pt reports she is scared of being arrested. Pt reports last week her husband woke her up because she "was gurgling in her sleep"

## 2018-06-25 NOTE — ED Notes (Signed)
Alethia Berthold- C: [phone] H: [phone]

## 2018-06-25 NOTE — ED Notes (Signed)
Bed: [facility] Expected date:  Expected time:  Means of arrival:  Comments: 

## 2018-06-25 NOTE — ED Notes (Signed)
Bed: [facility] Expected date:  Expected time:  Means of arrival:  Comments: 

## 2018-06-25 NOTE — ED Provider Notes (Signed)
[facility] DEPT Provider Note   CSN: [id number] Arrival date & time: 06/25/18  1246     History   Chief Complaint Chief Complaint  Patient presents with  . Depression  . Suicidal  . Anxiety    HPI Sarah Reeves is a 57 y.o. female.  HPI   Pt is a 57 y/o female with a h/o asthma, depression, anxiety, fibromyalgia, GERD, Hep C, HSV, HLD, insomnia, mental disorder who presents to the ED today for evaluation of suicidal ideations, depression, and anxiety.   Patient states that his symptoms of depression and anxiety and suicidal ideations have been ongoing for a long period of time.  States that exacerbating factors include taking care of her husband, multiple medical issues, and paranoid thoughts.  She states that she feels very withdrawn and hopeless.  She feels scared to go out in public or watch TV because she feels like people are out to get her and trying to hurt her.  She states that she cries for several hours a day.  Last week she states that she put five 25 MCG/hour fentanyl patches onto her stomach and put a heating pad on top of this.  She also took multiple unknown pills.  She states that after that her husband told her that she was "gurgling in my sleep".  She states that she then got up to the back to the bathroom in the middle the night and took the patches off of her abdomen.  She states she did not feel back to normal after several days but she is currently now asymptomatic other than her psychiatric complaints today.  She denies any specific plan for suicide but states that she feels like she cannot take it anymore.  No homicidal ideations.  No auditory or visual hallucinations.  Past Medical History:  Diagnosis Date  . Anxiety   . Asthma   . Complication of anesthesia    takes alot to sedated  . Depression   . Fibromyalgia   . GERD (gastroesophageal reflux disease)   . Hepatitis C    (Dr. Earlean Shawl) Treated with Harvoni March-May 2016   . Herpes simplex   . Hyperlipemia   . Insomnia   . Mental disorder   . Neuropathy   . PONV (postoperative nausea and vomiting)     Patient Active Problem List   Diagnosis Date Noted  . Balance problem 01/28/2018  . Tremor 01/28/2018  . Tobacco abuse counseling 07/25/2015  . Chest pain with moderate risk for cardiac etiology 06/26/2015  . Dizziness of unknown cause 06/26/2015  . Hyperglycemia 06/26/2015  . Paresthesia 06/20/2015  . Suicidal ideation 06/06/2013  . MDD (major depressive disorder), recurrent episode, severe ([place]) 06/06/2013  . Alcohol addiction ([place]) 04/30/2013  . HEPATITIS C 06/30/2007  . ALLERGIC RHINITIS 06/30/2007  . ASTHMA 06/30/2007  . GERD 06/30/2007    Past Surgical History:  Procedure Laterality Date  . BREAST ENHANCEMENT SURGERY    . BREAST LUMPECTOMY WITH RADIOACTIVE SEED LOCALIZATION Left 12/26/2014   Procedure: LEFT BREAST LUMPECTOMY WITH RADIOACTIVE SEED LOCALIZATION;  Surgeon: Autumn Messing III, MD;  Location: [facility];  Service: General;  Laterality: Left;  . BREAST REDUCTION SURGERY    . COLONOSCOPY    . EXPLORATORY LAPAROTOMY    . NM MYOVIEW LTD  07/07/15   Normal Myocardial Perfusion Scan. Low risk lexiscan nuclear study with minimal insignificant breast attenuation and normal myocardial perfusion and function; EF 53% without wall motion abnormalities and  normal systolic thickening  . TEMPOROMANDIBULAR JOINT SURGERY    . TUBAL LIGATION    . UPPER GI ENDOSCOPY     x5     OB History    Gravida      Para      Term      Preterm      AB      Living  0     SAB      TAB      Ectopic      Multiple      Live Births               Home Medications    Prior to Admission medications   Medication Sig Start Date End Date Taking? Authorizing Provider  Ascorbic Acid (VITAMIN C) 1000 MG tablet Take 1,000 mg by mouth daily.   Yes [provider]  cetirizine (ZYRTEC) 10 MG tablet Take 1 tablet (10 mg total)  by mouth daily. 06/16/13  Yes Mashburn, Marlane Hatcher, PA-C  cholecalciferol (VITAMIN D) 1000 UNITS tablet Take 2,000 Units by mouth daily.    Yes [provider]  cyclobenzaprine (FLEXERIL) 10 MG tablet Take 10 mg by mouth 3 (three) times daily as needed for muscle spasms.   Yes [provider]  estradiol (VIVELLE-DOT) 0.075 MG/24HR Place 1 patch onto the skin 2 (two) times a week.   Yes [provider]  gabapentin (NEURONTIN) 100 MG capsule Take 200 mg by mouth daily as needed (pain).   Yes [provider]  HYDROcodone-acetaminophen (NORCO) 10-325 MG tablet 0.5 to 1 tablet every 6 hours as needed for pain. 10/29/17  Yes [provider]  LORazepam (ATIVAN) 1 MG tablet Take 2 mg by mouth daily as needed for anxiety or sleep.    Yes [provider]  magnesium oxide (MAG-OX) 400 MG tablet Take 400 mg by mouth daily.   Yes [provider]  pregabalin (LYRICA) 75 MG capsule Take 150 mg by mouth daily.    Yes [provider]  progesterone (ENDOMETRIN) 100 MG vaginal insert Place 100 mg vaginally at bedtime.   Yes [provider]  promethazine (PHENERGAN) 25 MG tablet Take 25 mg by mouth 2 (two) times daily as needed. 04/20/18  Yes [provider]  Pseudoeph-Doxylamine-DM-APAP (NYQUIL PO) Take 10 mLs by mouth at bedtime as needed (sleep).   Yes [provider]  valACYclovir (VALTREX) 500 MG tablet Take 500 mg by mouth 2 (two) times daily as needed (HERPES).   Yes [provider]  zolpidem (AMBIEN) 10 MG tablet Take 10 mg by mouth at bedtime as needed for sleep.   Yes [provider]  nitroGLYCERIN (NITROSTAT) 0.4 MG SL tablet Place 1 tablet (0.4 mg total) under the tongue every 5 (five) minutes as needed for chest pain. Patient not taking: Reported on 06/25/2018 07/26/15   Leonie Man, MD    Family History Family History  Problem Relation Age of Onset  . Diabetes Mother   . Lung disease Mother     . Heart disease Mother   . Breast cancer Mother   . Heart disease Father   . Colon cancer Father   . Hypothyroidism Brother   . Alzheimer's disease Maternal Aunt   . Skin cancer Maternal Aunt   . Alzheimer's disease Maternal Grandmother     Social History Social History   Tobacco Use  . Smoking status: Current Every Day Smoker    Packs/day: 0.50  Types: Cigarettes  . Smokeless tobacco: Never Used  Substance Use Topics  . Alcohol use: No  . Drug use: No    Comment: did have hx drug abuse as teen     Allergies   Oysters [shellfish allergy]; Zoloft [sertraline hcl]; Latex; Amitriptyline; Clindamycin/lincomycin; Topamax [topiramate]; and Uribel [urelle]   Review of Systems Review of Systems  Constitutional: Negative for chills and fever.  HENT: Negative for ear pain and sore throat.   Eyes: Negative for pain and visual disturbance.  Respiratory: Negative for cough and shortness of breath.   Cardiovascular: Negative for chest pain and palpitations.  Gastrointestinal: Negative for abdominal pain and vomiting.  Genitourinary: Negative for dysuria and hematuria.  Musculoskeletal: Negative for arthralgias and back pain.  Skin: Negative for color change and rash.  Neurological: Negative for seizures and syncope.  Psychiatric/Behavioral:       Anxiety, depression, SI. No HI or AVH. Paranoid thoughts.  All other systems reviewed and are negative.  Physical Exam Updated Vital Signs BP 133/86 (BP Location: Left Arm)   Pulse 77   Temp 98.3 F (36.8 C) (Oral)   Resp 18   SpO2 96%   Physical Exam  Constitutional: She appears well-developed and well-nourished. No distress.  HENT:  Head: Normocephalic and atraumatic.  Eyes: Conjunctivae are normal.  Neck: Neck supple.  Cardiovascular: Normal rate, regular rhythm and normal heart sounds.  No murmur heard. Pulmonary/Chest: Effort normal and breath sounds normal. No stridor. No respiratory distress. She has no wheezes.   Abdominal: Soft. Bowel sounds are normal. She exhibits no distension. There is no tenderness. There is no guarding.  Musculoskeletal: She exhibits no edema.  Neurological: She is alert.  Skin: Skin is warm and dry. Capillary refill takes less than 2 seconds.  Psychiatric: Her behavior is normal. Judgment and thought content normal.  Flat affect. Cooperative. Answers questions appropriately.  Nursing note and vitals reviewed.  ED Treatments / Results  Labs (all labs ordered are listed, but only abnormal results are displayed) Labs Reviewed  ACETAMINOPHEN LEVEL - Abnormal; Notable for the following components:      Result Value   Acetaminophen (Tylenol), Serum <10 (*)    All other components within normal limits  COMPREHENSIVE METABOLIC PANEL  ETHANOL  SALICYLATE LEVEL  CBC  RAPID URINE DRUG SCREEN, HOSP PERFORMED  I-STAT BETA HCG BLOOD, ED ([facility], [facility], [facility] ONLY)    EKG EKG Interpretation  Date/Time:  Thursday June 25 2018 16:49:46 EDT Ventricular Rate:  81 PR Interval:    QRS Duration: 93 QT Interval:  382 QTC Calculation: 444 R Axis:   73 Text Interpretation:  Sinus rhythm Short PR interval No significant change since last tracing Confirmed by Deno Etienne [phone]) on 06/25/2018 5:04:31 PM   Radiology No results found.  Procedures Procedures (including critical care time)  Medications Ordered in ED Medications  OLANZapine (ZYPREXA) tablet 5 mg (has no administration in time range)  vitamin C (ASCORBIC ACID) tablet 1,000 mg (has no administration in time range)  loratadine (CLARITIN) tablet 10 mg (has no administration in time range)  cholecalciferol (VITAMIN D) tablet 2,000 Units (has no administration in time range)  estradiol (VIVELLE-DOT) 0.075 MG/24HR 1 patch (has no administration in time range)  gabapentin (NEURONTIN) capsule 200 mg (has no administration in time range)  HYDROcodone-acetaminophen (NORCO) 10-325 MG per tablet 1 tablet (has no administration in  time range)  LORazepam (ATIVAN) tablet 2 mg (has no administration in time range)  magnesium oxide (MAG-OX) tablet 400  mg (has no administration in time range)  pregabalin (LYRICA) capsule 150 mg (has no administration in time range)  progesterone (ENDOMETRIN) vaginal insert 100 mg (has no administration in time range)  promethazine (PHENERGAN) tablet 25 mg (has no administration in time range)  valACYclovir (VALTREX) tablet 500 mg (has no administration in time range)  zolpidem (AMBIEN) tablet 10 mg (has no administration in time range)     Initial Impression / Assessment and Plan / ED Course  I have reviewed the triage vital signs and the nursing notes.  Pertinent labs & imaging results that were available during my care of the patient were reviewed by me and considered in my medical decision making (see chart for details).   4:16 PM CONSULT with Bryson Ha from poison control who agreed with current workup, only advised to order and EKG.  Final Clinical Impressions(s) / ED Diagnoses   Final diagnoses:  Depression, unspecified depression type  Anxiety  Suicidal ideation   Pt presenting with anxiety, depression, and suicidal ideations. Vitals initially with some tachycardia and mild hypertension, but otherwise within normal limits.  Patient attributes tachycardia to anxiety.  She had a normal pulse and O2 sat while on monitor throughout the entirety of my evaluation.  Labs are reassuring. ETOH, UDS, negative. Acetaminophen and salicylate levels are negative.   Poison control advised to order EKG given her h/o using fentanyl patches and taking pills last week. EKG with NSR, no ischemic changes. Short PR interval. No significant change from prior. Do not suspect any other emergent/lifethreatening condition that would require further workup or admission to the hospital.  Pt is medically cleared for TTS evaluation.  Pt was evaluated by [facility] and was recommended for inpatient admission. Pt  care signed out to oncoming provider at time of shift change.  ED Discharge Orders    None       Bishop Dublin 06/25/18 2239    Virgel Manifold, MD 06/26/18 1027

## 2018-06-26 ENCOUNTER — Inpatient Hospital Stay (HOSPITAL_COMMUNITY)
Admission: AD | Admit: 2018-06-26 | Discharge: 2018-07-03 | DRG: 885 | Disposition: A | Discharge status: Home / Self Care | Payer: BLUE CROSS/BLUE SHIELD | Source: Intra-hospital | Attending: Psychiatry | Admitting: Psychiatry

## 2018-06-26 ENCOUNTER — Encounter (HOSPITAL_COMMUNITY): Payer: Self-pay | Admitting: *Deleted

## 2018-06-26 ENCOUNTER — Other Ambulatory Visit: Payer: Self-pay

## 2018-06-26 DIAGNOSIS — F17210 Nicotine dependence, cigarettes, uncomplicated: Secondary | ICD-10-CM | POA: Diagnosis present

## 2018-06-26 DIAGNOSIS — Z62810 Personal history of physical and sexual abuse in childhood: Secondary | ICD-10-CM | POA: Diagnosis not present

## 2018-06-26 DIAGNOSIS — F4310 Post-traumatic stress disorder, unspecified: Secondary | ICD-10-CM

## 2018-06-26 DIAGNOSIS — Z915 Personal history of self-harm: Secondary | ICD-10-CM | POA: Diagnosis not present

## 2018-06-26 DIAGNOSIS — K219 Gastro-esophageal reflux disease without esophagitis: Secondary | ICD-10-CM | POA: Diagnosis not present

## 2018-06-26 DIAGNOSIS — Z91040 Latex allergy status: Secondary | ICD-10-CM | POA: Diagnosis not present

## 2018-06-26 DIAGNOSIS — F333 Major depressive disorder, recurrent, severe with psychotic symptoms: Principal | ICD-10-CM | POA: Diagnosis present

## 2018-06-26 DIAGNOSIS — M797 Fibromyalgia: Secondary | ICD-10-CM | POA: Diagnosis present

## 2018-06-26 DIAGNOSIS — Z818 Family history of other mental and behavioral disorders: Secondary | ICD-10-CM | POA: Diagnosis not present

## 2018-06-26 DIAGNOSIS — F411 Generalized anxiety disorder: Secondary | ICD-10-CM

## 2018-06-26 DIAGNOSIS — F419 Anxiety disorder, unspecified: Secondary | ICD-10-CM | POA: Diagnosis not present

## 2018-06-26 DIAGNOSIS — R45851 Suicidal ideations: Secondary | ICD-10-CM | POA: Diagnosis present

## 2018-06-26 DIAGNOSIS — Z23 Encounter for immunization: Secondary | ICD-10-CM

## 2018-06-26 DIAGNOSIS — Z79899 Other long term (current) drug therapy: Secondary | ICD-10-CM | POA: Diagnosis not present

## 2018-06-26 DIAGNOSIS — G4700 Insomnia, unspecified: Secondary | ICD-10-CM | POA: Diagnosis present

## 2018-06-26 DIAGNOSIS — E785 Hyperlipidemia, unspecified: Secondary | ICD-10-CM | POA: Diagnosis present

## 2018-06-26 DIAGNOSIS — G629 Polyneuropathy, unspecified: Secondary | ICD-10-CM | POA: Diagnosis present

## 2018-06-26 DIAGNOSIS — Z636 Dependent relative needing care at home: Secondary | ICD-10-CM

## 2018-06-26 DIAGNOSIS — F329 Major depressive disorder, single episode, unspecified: Secondary | ICD-10-CM | POA: Diagnosis not present

## 2018-06-26 DIAGNOSIS — Z833 Family history of diabetes mellitus: Secondary | ICD-10-CM

## 2018-06-26 MED ORDER — PNEUMOCOCCAL VAC POLYVALENT 25 MCG/0.5ML IJ INJ
0.5000 mL | INJECTION | INTRAMUSCULAR | Status: AC
  Administered 2018-06-27: 0.5 mL via INTRAMUSCULAR

## 2018-06-26 MED ORDER — ESTRADIOL 0.05 MG/24HR TD PTWK
0.0500 mg | MEDICATED_PATCH | TRANSDERMAL | Status: DC
  Filled 2018-06-26: qty 1

## 2018-06-26 MED ORDER — PROMETHAZINE HCL 25 MG PO TABS: 25.0000 mg | ORAL_TABLET | Freq: Two times a day (BID) | ORAL | Status: DC | PRN

## 2018-06-26 MED ORDER — NICOTINE 14 MG/24HR TD PT24
14.0000 mg | MEDICATED_PATCH | Freq: Every day | TRANSDERMAL | Status: DC
  Filled 2018-06-26: qty 1

## 2018-06-26 MED ORDER — LORAZEPAM 2 MG/ML IJ SOLN
1.0000 mg | Freq: Once | INTRAMUSCULAR | Status: AC
  Administered 2018-06-26: 1 mg via INTRAMUSCULAR
  Filled 2018-06-26: qty 1

## 2018-06-26 MED ORDER — NICOTINE 14 MG/24HR TD PT24
14.0000 mg | MEDICATED_PATCH | Freq: Every day | TRANSDERMAL | Status: DC
  Administered 2018-06-26: 14 mg via TRANSDERMAL
  Filled 2018-06-26: qty 1

## 2018-06-26 MED ORDER — VITAMIN C 500 MG PO TABS
1000.0000 mg | ORAL_TABLET | Freq: Every day | ORAL | Status: DC
  Administered 2018-06-27 – 2018-07-03 (×7): 1000 mg via ORAL
  Filled 2018-06-26 (×10): qty 2

## 2018-06-26 MED ORDER — ZIPRASIDONE MESYLATE 20 MG IM SOLR
20.0000 mg | Freq: Once | INTRAMUSCULAR | Status: AC
  Administered 2018-06-26: 20 mg via INTRAMUSCULAR
  Filled 2018-06-26 (×2): qty 20

## 2018-06-26 MED ORDER — PROGESTERONE 100 MG VA INST: 100.0000 mg | VAGINAL_INSERT | Freq: Every day | VAGINAL | Status: DC

## 2018-06-26 MED ORDER — VITAMIN D3 25 MCG (1000 UNIT) PO TABS
2000.0000 [IU] | ORAL_TABLET | Freq: Every day | ORAL | Status: DC
  Administered 2018-06-27 – 2018-07-03 (×7): 2000 [IU] via ORAL
  Filled 2018-06-26 (×11): qty 2

## 2018-06-26 MED ORDER — MAGNESIUM HYDROXIDE 400 MG/5ML PO SUSP
30.0000 mL | Freq: Every day | ORAL | Status: DC | PRN
  Administered 2018-06-28: 30 mL via ORAL
  Filled 2018-06-26: qty 30

## 2018-06-26 MED ORDER — HYDROXYZINE HCL 25 MG PO TABS
25.0000 mg | ORAL_TABLET | Freq: Three times a day (TID) | ORAL | Status: DC | PRN
  Filled 2018-06-26: qty 1

## 2018-06-26 MED ORDER — ZOLPIDEM TARTRATE 5 MG PO TABS: 5.0000 mg | ORAL_TABLET | Freq: Every evening | ORAL | Status: DC | PRN

## 2018-06-26 MED ORDER — VITAMIN B-1 100 MG PO TABS
100.0000 mg | ORAL_TABLET | Freq: Every day | ORAL | Status: DC
  Administered 2018-06-27 – 2018-07-03 (×7): 100 mg via ORAL
  Filled 2018-06-26 (×10): qty 1

## 2018-06-26 MED ORDER — TRAZODONE HCL 50 MG PO TABS: 25.0000 mg | ORAL_TABLET | Freq: Every evening | ORAL | Status: DC | PRN

## 2018-06-26 MED ORDER — ADULT MULTIVITAMIN W/MINERALS CH
1.0000 | ORAL_TABLET | Freq: Every day | ORAL | Status: DC
  Administered 2018-06-26 – 2018-07-03 (×8): 1 via ORAL
  Filled 2018-06-26 (×12): qty 1

## 2018-06-26 MED ORDER — ALUM & MAG HYDROXIDE-SIMETH 200-200-20 MG/5ML PO SUSP: 30.0000 mL | ORAL | Status: DC | PRN

## 2018-06-26 MED ORDER — VALACYCLOVIR HCL 500 MG PO TABS
500.0000 mg | ORAL_TABLET | Freq: Two times a day (BID) | ORAL | Status: DC | PRN
  Filled 2018-06-26: qty 1

## 2018-06-26 MED ORDER — PREGABALIN 75 MG PO CAPS: 150.0000 mg | ORAL_CAPSULE | Freq: Every day | ORAL | Status: DC

## 2018-06-26 MED ORDER — INFLUENZA VAC SPLIT QUAD 0.5 ML IM SUSY
0.5000 mL | PREFILLED_SYRINGE | INTRAMUSCULAR | Status: AC
  Administered 2018-06-27: 0.5 mL via INTRAMUSCULAR
  Filled 2018-06-26: qty 0.5

## 2018-06-26 MED ORDER — OLANZAPINE 2.5 MG PO TABS
2.5000 mg | ORAL_TABLET | Freq: Every day | ORAL | Status: DC
  Filled 2018-06-26 (×2): qty 1

## 2018-06-26 MED ORDER — TRAZODONE HCL 50 MG PO TABS: 50.0000 mg | ORAL_TABLET | Freq: Every evening | ORAL | Status: DC | PRN

## 2018-06-26 MED ORDER — DULOXETINE HCL 20 MG PO CPEP
20.0000 mg | ORAL_CAPSULE | Freq: Every day | ORAL | Status: DC
  Administered 2018-06-27: 20 mg via ORAL
  Filled 2018-06-26 (×4): qty 1

## 2018-06-26 MED ORDER — MAGNESIUM OXIDE 400 (241.3 MG) MG PO TABS
400.0000 mg | ORAL_TABLET | Freq: Every day | ORAL | Status: DC
  Filled 2018-06-26: qty 1

## 2018-06-26 MED ORDER — OLANZAPINE 5 MG PO TABS
5.0000 mg | ORAL_TABLET | Freq: Every day | ORAL | Status: DC
  Filled 2018-06-26: qty 1

## 2018-06-26 MED ORDER — DIPHENHYDRAMINE HCL 50 MG/ML IJ SOLN
50.0000 mg | Freq: Four times a day (QID) | INTRAMUSCULAR | Status: DC | PRN
  Administered 2018-06-26: 50 mg via INTRAMUSCULAR
  Filled 2018-06-26: qty 1

## 2018-06-26 MED ORDER — LORAZEPAM 1 MG PO TABS
1.0000 mg | ORAL_TABLET | Freq: Four times a day (QID) | ORAL | Status: AC | PRN
  Administered 2018-06-26 – 2018-06-28 (×2): 1 mg via ORAL
  Filled 2018-06-26 (×3): qty 1

## 2018-06-26 MED ORDER — ACETAMINOPHEN 325 MG PO TABS
650.0000 mg | ORAL_TABLET | Freq: Four times a day (QID) | ORAL | Status: DC | PRN
  Administered 2018-06-26 – 2018-06-28 (×5): 650 mg via ORAL
  Filled 2018-06-26 (×6): qty 2

## 2018-06-26 MED ORDER — LORATADINE 10 MG PO TABS
10.0000 mg | ORAL_TABLET | Freq: Every day | ORAL | Status: DC
  Administered 2018-06-27 – 2018-07-03 (×7): 10 mg via ORAL
  Filled 2018-06-26 (×11): qty 1

## 2018-06-26 MED ORDER — NICOTINE POLACRILEX 2 MG MT GUM
2.0000 mg | CHEWING_GUM | OROMUCOSAL | Status: DC | PRN
  Administered 2018-06-27 – 2018-07-03 (×21): 2 mg via ORAL
  Filled 2018-06-26 (×2): qty 1
  Filled 2018-06-26: qty 10
  Filled 2018-06-26 (×4): qty 1
  Filled 2018-06-26: qty 10
  Filled 2018-06-26 (×2): qty 1

## 2018-06-26 MED ORDER — DULOXETINE HCL 20 MG PO CPEP
20.0000 mg | ORAL_CAPSULE | Freq: Every day | ORAL | Status: DC
  Administered 2018-06-26: 20 mg via ORAL
  Filled 2018-06-26: qty 1
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
## 2018-06-26 NOTE — ED Notes (Signed)
Pt talking on hallway phone.  

## 2018-06-26 NOTE — ED Notes (Signed)
Visitor at bedside.

## 2018-06-26 NOTE — Consult Note (Signed)
[facility] Face-to-Face Psychiatry Consult   Reason for Consult: depression, anxiety and suicidal thoughts Referring Physician:  EDP Patient Identification: Sarah Reeves MRN:  [id number] Principal Diagnosis: Major depressive disorder, recurrent, severe with psychotic features [facility]) Diagnosis:   Patient Active Problem List   Diagnosis Date Noted  . Post traumatic stress disorder (PTSD) [F43.10] 06/26/2018  . Generalized anxiety disorder [F41.1] 06/26/2018  . Balance problem [R26.89] 01/28/2018  . Tremor [R25.1] 01/28/2018  . Tobacco abuse counseling [Z71.6] 07/25/2015  . Chest pain with moderate risk for cardiac etiology [R07.9] 06/26/2015  . Dizziness of unknown cause [R42] 06/26/2015  . Hyperglycemia [R73.9] 06/26/2015  . Paresthesia [R20.2] 06/20/2015  . Suicidal ideation [R45.851] 06/06/2013  . Major depressive disorder, recurrent, severe with psychotic features ([place]) [F33.3] 06/06/2013  . Alcohol addiction ([place]) [F10.20] 04/30/2013  . HEPATITIS C [B17.10] 06/30/2007  . ALLERGIC RHINITIS [J30.9] 06/30/2007  . ASTHMA [J45.909] 06/30/2007  . GERD [K21.9] 06/30/2007    Total Time spent with patient: 45 minutes  Subjective:   Sarah Reeves is a 57 y.o. female patient admitted with suicidal thought with plan.  HPI: Patient who reports history of Asthma, major depression, generalized anxiety disorder, fibromyalgia, GERD, Hep C, HSV, HLD, Tinnitus and severe  Insomnia. Patient reports that she has been getting increasingly more depressed, anxiety, worried, apprehensive, insomniac, paranoid, suicidal with the plan to take multiple pills. Patient reports having multiple medical problems and has been stressed out dealing with them. She reports being recently diagnosed with Tinnitus and has seen at least 2 doctors who told her they can't help her.States that exacerbating factors include taking care of her 17 year old husband who has multiple health issues. She also reports flashback,  nightmares, weird dream about her childhood trauma.  Past Psychiatric History: as above  Risk to Self: Suicidal Ideation: Yes-Currently Present Suicidal Intent: Yes-Currently Present Is patient at risk for suicide?: Yes Suicidal Plan?: Yes-Currently Present Specify Current Suicidal Plan: (denies current plan) Access to Means: No What has been your use of drugs/alcohol within the last 12 months?: (none) How many times?: (multiple) Other Self Harm Risks: (health and chronic pain issues) Triggers for Past Attempts: Unknown Intentional Self Injurious Behavior: None Risk to Others: Homicidal Ideation: No Thoughts of Harm to Others: No Current Homicidal Intent: No Current Homicidal Plan: No Access to Homicidal Means: No Identified Victim: none History of harm to others?: No Assessment of Violence: None Noted Violent Behavior Description: none Does patient have access to weapons?: No Criminal Charges Pending?: No Does patient have a court date: No Prior Inpatient Therapy: Prior Inpatient Therapy: Yes Prior Therapy Dates: (2014) Prior Therapy Facilty/Provider(s): [facility] Reason for Treatment: depression/ETOH Prior Outpatient Therapy: Prior Outpatient Therapy: No Does patient have an ACCT team?: No Does patient have Intensive In-House Services?  : No Does patient have [facility] services? : No Does patient have P4CC services?: No  Past Medical History:  Past Medical History:  Diagnosis Date  . Anxiety   . Asthma   . Complication of anesthesia    takes alot to sedated  . Depression   . Fibromyalgia   . GERD (gastroesophageal reflux disease)   . Hepatitis C    (Dr. Earlean Shawl) Treated with Harvoni March-May 2016  . Herpes simplex   . Hyperlipemia   . Insomnia   . Mental disorder   . Neuropathy   . PONV (postoperative nausea and vomiting)     Past Surgical History:  Procedure Laterality Date  . BREAST ENHANCEMENT SURGERY    .  BREAST LUMPECTOMY WITH RADIOACTIVE SEED LOCALIZATION  Left 12/26/2014   Procedure: LEFT BREAST LUMPECTOMY WITH RADIOACTIVE SEED LOCALIZATION;  Surgeon: Autumn Messing III, MD;  Location: [place];  Service: General;  Laterality: Left;  . BREAST REDUCTION SURGERY    . COLONOSCOPY    . EXPLORATORY LAPAROTOMY    . NM MYOVIEW LTD  07/07/15   Normal Myocardial Perfusion Scan. Low risk lexiscan nuclear study with minimal insignificant breast attenuation and normal myocardial perfusion and function; EF 53% without wall motion abnormalities and normal systolic thickening  . TEMPOROMANDIBULAR JOINT SURGERY    . TUBAL LIGATION    . UPPER GI ENDOSCOPY     x5   Family History:  Family History  Problem Relation Age of Onset  . Diabetes Mother   . Lung disease Mother   . Heart disease Mother   . Breast cancer Mother   . Heart disease Father   . Colon cancer Father   . Hypothyroidism Brother   . Alzheimer's disease Maternal Aunt   . Skin cancer Maternal Aunt   . Alzheimer's disease Maternal Grandmother    Family Psychiatric  History:  Social History:  Social History   Substance and Sexual Activity  Alcohol Use No     Social History   Substance and Sexual Activity  Drug Use No   Comment: did have hx drug abuse as teen    Social History   Socioeconomic History  . Marital status: Married    Spouse name: Not on file  . Number of children: 0  . Years of education: 69  . Highest education level: Not on file  Occupational History  . Occupation: Retired  Scientific laboratory technician  . Financial resource strain: Not on file  . Food insecurity:    Worry: Not on file    Inability: Not on file  . Transportation needs:    Medical: Not on file    Non-medical: Not on file  Tobacco Use  . Smoking status: Current Every Day Smoker    Packs/day: 0.50    Types: Cigarettes  . Smokeless tobacco: Never Used  Substance and Sexual Activity  . Alcohol use: No  . Drug use: No    Comment: did have hx drug abuse as teen  . Sexual activity: Yes    Birth  control/protection: Surgical  Lifestyle  . Physical activity:    Days per week: Not on file    Minutes per session: Not on file  . Stress: Not on file  Relationships  . Social connections:    Talks on phone: Not on file    Gets together: Not on file    Attends religious service: Not on file    Active member of club or organization: Not on file    Attends meetings of clubs or organizations: Not on file    Relationship status: Not on file  Other Topics Concern  . Not on file  Social History Narrative   Patient is right handed.   Four cups caffeine per day.   Lives at home with husband.   Additional Social History:    Allergies:   Allergies  Allergen Reactions  . Oysters [Shellfish Allergy] Other (See Comments)    Unknown reaction; "MD told me I was allergic"  . Zoloft [Sertraline Hcl] Other (See Comments)    REACTION: Nightmares, Grind teeth really bad  . Latex Itching    Itch and blisters from contact  . Amitriptyline     Hot, itching, tongue  tingling, breakthrough bleeding.  . Clindamycin/Lincomycin Hives and Other (See Comments)    Stated that this causes her to get really horse and made her feel really hot  . Topamax [Topiramate]     Says it makes her "constipation and she can't urinate normally"  . Uribel [Urelle] Swelling and Other (See Comments)    Patient stated this makes her stomach swell and makes her feel si    Labs:  Results for orders placed or performed during the hospital encounter of 06/25/18 (from the past 48 hour(s))  Comprehensive metabolic panel     Status: None   Collection Time: 06/25/18  1:26 PM  Result Value Ref Range   Sodium 137 135 - 145 mmol/L   Potassium 4.1 3.5 - 5.1 mmol/L   Chloride 101 98 - 111 mmol/L   CO2 27 22 - 32 mmol/L   Glucose, Bld 99 70 - 99 mg/dL   BUN 8 6 - 20 mg/dL   Creatinine, Ser 0.62 0.44 - 1.00 mg/dL   Calcium 9.3 8.9 - 10.3 mg/dL   Total Protein 7.2 6.5 - 8.1 g/dL   Albumin 4.5 3.5 - 5.0 g/dL   AST 16 15 - 41  U/L   ALT 11 0 - 44 U/L   Alkaline Phosphatase 43 38 - 126 U/L   Total Bilirubin 0.3 0.3 - 1.2 mg/dL   GFR calc non Af Amer >60 >60 mL/min   GFR calc Af Amer >60 >60 mL/min    Comment: (NOTE) The eGFR has been calculated using the CKD EPI equation. This calculation has not been validated in all clinical situations. eGFR's persistently <60 mL/min signify possible Chronic Kidney Disease.    Anion gap 9 5 - 15    Comment: Performed at [facility], [place] [address] [place], [place] [address]  Ethanol     Status: None   Collection Time: 06/25/18  1:26 PM  Result Value Ref Range   Alcohol, Ethyl (B) <10 <10 mg/dL    Comment: (NOTE) Lowest detectable limit for serum alcohol is 10 mg/dL. For medical purposes only. Performed at [facility], [place] [address], [place], [place] [address]   Salicylate level     Status: None   Collection Time: 06/25/18  1:26 PM  Result Value Ref Range   Salicylate Lvl <2.9 2.8 - 30.0 mg/dL    Comment: Performed at [facility], [place] [address] [place], [place] [address]  Acetaminophen level     Status: Abnormal   Collection Time: 06/25/18  1:26 PM  Result Value Ref Range   Acetaminophen (Tylenol), Serum <10 (L) 10 - 30 ug/mL    Comment: (NOTE) Therapeutic concentrations vary significantly. A range of 10-30 ug/mL  may be an effective concentration for many patients. However, some  are best treated at concentrations outside of this range. Acetaminophen concentrations >150 ug/mL at 4 hours after ingestion  and >50 ug/mL at 12 hours after ingestion are often associated with  toxic reactions. Performed at [facility], [place] [address], [place], [place] [address]   cbc     Status: None   Collection Time: 06/25/18  1:26 PM  Result Value Ref Range   WBC 8.7 4.0 - 10.5 K/uL   RBC 4.58 3.87 - 5.11 MIL/uL   Hemoglobin 13.8 12.0 - 15.0 g/dL   HCT 41.3 36.0 - 46.0 %   MCV 90.2 78.0 -  100.0 fL   MCH 30.1 26.0 - 34.0 pg  MCHC 33.4 30.0 - 36.0 g/dL   RDW 14.0 11.5 - 15.5 %   Platelets 302 150 - 400 K/uL    Comment: Performed at [facility], [place] [address] [place], [place] [address]  I-Stat beta hCG blood, ED     Status: None   Collection Time: 06/25/18  1:36 PM  Result Value Ref Range   I-stat hCG, quantitative <5.0 <5 mIU/mL   Comment 3            Comment:   GEST. AGE      CONC.  (mIU/mL)   <=1 WEEK        5 - 50     2 WEEKS       50 - 500     3 WEEKS       100 - 10,000     4 WEEKS     1,000 - 30,000        FEMALE AND NON-PREGNANT FEMALE:     LESS THAN 5 mIU/mL   Rapid urine drug screen (hospital performed)     Status: None   Collection Time: 06/25/18  2:26 PM  Result Value Ref Range   Opiates NONE DETECTED NONE DETECTED   Cocaine NONE DETECTED NONE DETECTED   Benzodiazepines NONE DETECTED NONE DETECTED   Amphetamines NONE DETECTED NONE DETECTED   Tetrahydrocannabinol NONE DETECTED NONE DETECTED   Barbiturates NONE DETECTED NONE DETECTED    Comment: (NOTE) DRUG SCREEN FOR MEDICAL PURPOSES ONLY.  IF CONFIRMATION IS NEEDED FOR ANY PURPOSE, NOTIFY LAB WITHIN 5 DAYS. LOWEST DETECTABLE LIMITS FOR URINE DRUG SCREEN Drug Class                     Cutoff (ng/mL) Amphetamine and metabolites    1000 Barbiturate and metabolites    200 Benzodiazepine                 662 Tricyclics and metabolites     300 Opiates and metabolites        300 Cocaine and metabolites        300 THC                            50 Performed at [facility], [place] [address], [place], [place] [address]     Current Facility-Administered Medications  Medication Dose Route Frequency Provider Last Rate Last Dose  . cholecalciferol (VITAMIN D) tablet 2,000 Units  2,000 Units Oral Daily Couture, Cortni S, PA-C   2,000 Units at 06/26/18 4650  . DULoxetine (CYMBALTA) DR capsule 20 mg  20 mg Oral Daily Exodus Kutzer, MD      . estradiol (CLIMARA -  Dosed in mg/24 hr) patch   Transdermal Weekly Couture, Cortni S, PA-C   0.075 mg at 06/26/18 0932  . HYDROcodone-acetaminophen (NORCO) 10-325 MG per tablet 1 tablet  1 tablet Oral Q6H PRN Couture, Cortni S, PA-C      . loratadine (CLARITIN) tablet 10 mg  10 mg Oral Daily Couture, Cortni S, PA-C   10 mg at 06/26/18 0928  . magnesium oxide (MAG-OX) tablet 400 mg  400 mg Oral Daily Couture, Cortni S, PA-C   400 mg at 06/26/18 0928  . OLANZapine (ZYPREXA) tablet 5 mg  5 mg Oral QHS Ethelene Hal, NP   5 mg at 06/25/18 2110  . pregabalin (LYRICA) capsule 150 mg  150 mg Oral Daily Couture, Cortni S,  PA-C   150 mg at 06/26/18 0928  . progesterone (ENDOMETRIN) vaginal insert 100 mg  100 mg Vaginal QHS Couture, Cortni S, PA-C      . promethazine (PHENERGAN) tablet 25 mg  25 mg Oral BID PRN Couture, Cortni S, PA-C      . valACYclovir (VALTREX) tablet 500 mg  500 mg Oral BID PRN Couture, Cortni S, PA-C      . vitamin C (ASCORBIC ACID) tablet 1,000 mg  1,000 mg Oral Daily Couture, Cortni S, PA-C   1,000 mg at 06/26/18 5329  . zolpidem (AMBIEN) tablet 5 mg  5 mg Oral QHS PRN Corena Pilgrim, MD       Current Outpatient Medications  Medication Sig Dispense Refill  . Ascorbic Acid (VITAMIN C) 1000 MG tablet Take 1,000 mg by mouth daily.    . cetirizine (ZYRTEC) 10 MG tablet Take 1 tablet (10 mg total) by mouth daily.    . cholecalciferol (VITAMIN D) 1000 UNITS tablet Take 2,000 Units by mouth daily.     . cyclobenzaprine (FLEXERIL) 10 MG tablet Take 10 mg by mouth 3 (three) times daily as needed for muscle spasms.    Marland Kitchen estradiol (VIVELLE-DOT) 0.075 MG/24HR Place 1 patch onto the skin 2 (two) times a week.    . gabapentin (NEURONTIN) 100 MG capsule Take 200 mg by mouth daily as needed (pain).    Marland Kitchen HYDROcodone-acetaminophen (NORCO) 10-325 MG tablet 0.5 to 1 tablet every 6 hours as needed for pain.  0  . LORazepam (ATIVAN) 1 MG tablet Take 2 mg by mouth daily as needed for anxiety or sleep.     .  magnesium oxide (MAG-OX) 400 MG tablet Take 400 mg by mouth daily.    . pregabalin (LYRICA) 75 MG capsule Take 150 mg by mouth daily.     . progesterone (ENDOMETRIN) 100 MG vaginal insert Place 100 mg vaginally at bedtime.    . promethazine (PHENERGAN) 25 MG tablet Take 25 mg by mouth 2 (two) times daily as needed.  0  . Pseudoeph-Doxylamine-DM-APAP (NYQUIL PO) Take 10 mLs by mouth at bedtime as needed (sleep).    . valACYclovir (VALTREX) 500 MG tablet Take 500 mg by mouth 2 (two) times daily as needed (HERPES).    Marland Kitchen zolpidem (AMBIEN) 10 MG tablet Take 10 mg by mouth at bedtime as needed for sleep.    . nitroGLYCERIN (NITROSTAT) 0.4 MG SL tablet Place 1 tablet (0.4 mg total) under the tongue every 5 (five) minutes as needed for chest pain. (Patient not taking: Reported on 06/25/2018) 25 tablet 12   Facility-Administered Medications Ordered in Other Encounters  Medication Dose Route Frequency Provider Last Rate Last Dose  . chlorhexidine (HIBICLENS) 4 % liquid 1 application  1 application Topical Once Autumn Messing III, MD      . chlorhexidine (HIBICLENS) 4 % liquid 1 application  1 application Topical Once Jovita Kussmaul, MD        Musculoskeletal: Strength & Muscle Tone: within normal limits Gait & Station: normal Patient leans: N/A  Psychiatric Specialty Exam: Physical Exam  Psychiatric: Her speech is normal. Judgment normal. Her mood appears anxious. She is withdrawn. Thought content is paranoid and delusional. Cognition and memory are normal. She exhibits a depressed mood. She expresses suicidal ideation. She expresses suicidal plans.    Review of Systems  Constitutional: Negative.   HENT: Positive for tinnitus.   Eyes: Negative.   Respiratory: Negative.   Cardiovascular: Negative.   Gastrointestinal: Negative.  Genitourinary: Negative.   Musculoskeletal: Positive for myalgias.  Skin: Negative.   Neurological: Negative.   Endo/Heme/Allergies: Negative.   Psychiatric/Behavioral:  Positive for depression and suicidal ideas. The patient is nervous/anxious and has insomnia.     Blood pressure 110/63, pulse 66, temperature 98.5 F (36.9 C), temperature source Oral, resp. rate 18, SpO2 97 %.There is no height or weight on file to calculate BMI.  General Appearance: Casual  Eye Contact:  Good  Speech:  Clear and Coherent  Volume:  Decreased  Mood:  Anxious and Depressed  Affect:  Constricted  Thought Process:  Coherent and Linear  Orientation:  Full (Time, Place, and Person)  Thought Content:  Delusions  Suicidal Thoughts:  Yes.  with intent/plan  Homicidal Thoughts:  No  Memory:  Immediate;   Good Recent;   Good Remote;   Good  Judgement:  Poor  Insight:  Shallow  Psychomotor Activity:  Psychomotor Retardation  Concentration:  Concentration: Fair and Attention Span: Fair  Recall:  Good  Fund of Knowledge:  Good  Language:  Good  Akathisia:  No  Handed:  Right  AIMS (if indicated):     Assets:  Communication Skills Desire for Improvement Social Support  ADL's:  Intact  Cognition:  WNL  Sleep:   poor     Treatment Plan Summary: Daily contact with patient to assess and evaluate symptoms and progress in treatment and Medication management  Cymbalta 20 mg daily for depression/anxiety/PTSD Olanzapine 5 mg qhs for delusions/sleep  Disposition: Recommend psychiatric Inpatient admission when medically cleared.  Corena Pilgrim, MD 06/26/2018 11:22 AM

## 2018-06-26 NOTE — Progress Notes (Signed)
1:1 nursing note -   Met patient at start of shift who is disorganized, tangential with exhibits rapid speech. Resting in bed with complaints of chronic generalized pain at a 10/10. Patient ruminating about multiple somatic complaints. Difficult to redirect and converse with as patient is hyperverbal. Patient speaks of the multiple medications she takes and has tried as well as the numerous providers she has seen and continues to see. Patient states, "I just want to know what's wrong with me. That's part of why I wanted to do myself in."  EKG obtained per order. Discussed with Gilberto Better, PA and orders received. Medicated with education provided. Prn tylenol given for discomfort. Level I obs in place for safety as patient is unaware of limitations and unsteady on feet. Emotional support offered. Fall prevention plan in place and reviewed with patient.   Patient verbalizes understanding of POC, falls prevention education. On reassess, patient's pain is a 9/10 but states, "I'm grateful for anything that might help." Patient denies HI/AVH however then reports ringing in ears and paranoia to the point she cannot leave the house. States "this fibration under me. Don't you feel it?" Remains safe at this time with MHT at bedside. Will continue to monitor throughout the night.

## 2018-06-26 NOTE — ED Notes (Signed)
[facility] transport on unit to transfer pt to [facility] per MD order. Personal property given to [facility] for transfer. Ambulatory off unit.

## 2018-06-26 NOTE — BH Assessment (Signed)
[place] Assessment Progress Note  Per Corena Pilgrim, MD, this pt requires psychiatric hospitalization at this time.  Letitia Libra, RN, [facility] has assigned pt to [facility] Rm 400-1.  Pt has signed Voluntary Admission and Consent for Treatment, as well as Consent to Release Information to pt's husband, and signed forms have been faxed to [facility].  Pt's nurse, Caryl Pina, has been notified, and agrees to send original paperwork along with pt via [facility], and to call report to [phone].  Jalene Mullet, [place] Coordinator [phone]

## 2018-06-26 NOTE — ED Notes (Signed)
Pt resting in bed. Respirations even and unlabored. No distress noted. Will continue to monitor.

## 2018-06-26 NOTE — Significant Event (Signed)
S: Alerted by NS, to access Sarah Reeves, who is a 57 y/o female with a h/o asthma, depression, anxiety, fibromyalgia, GERD, Hep C, HSV, HLD, insomnia, mental disorder who presented to the ED earlier today for evaluation of suicidal ideations, depression, and anxiety. The patient is endorsing depression to include reported SA, sadness, hopelessness and despair. There is marked tactile (bed vibrating underneath me), auditory (Ringing in my ears) and visual hallucinations (people sticking their tongue out at me and licking their lips), coupled with paranoia and delusional thoughts. She also endorses marked initial and mid-insomnia, worrying, racing thoughts, rumination and agoraphobia.  O: V/s WNLl, 12-lead EKG, NSR, normal QT interval, Baseline labs, normal creatinine and LFT's, Toxicology screen neg  HTM:BPJPETKKOE: casual, speech: clear, volume normal,but  pressured, behavior: erratic, restless, cooperativeness: fair, thought process: tangential, flight of ideas, illogical, Though content: ruminative, recent SI with plan, intent, means, fears, phobias, Perceptions: tactile (vibrations, auditory (ringing in ears) and visual (people sticking their tongue out at me) hallucination, positive paranoia and delusional thoughts, Mood: depressed, Affect:non congruent with stated mood/mixed/manic, memory (short term) intact, Judgement: poor  Heent: benign  Integument W/d fair turgor  Pulm: CTA  Cardio ; Audible S1,S2, no M/GF/R  Neuro: CN 2 - 12 intact  A/P: MDD recurrent severe with psychotic features vs schizoaffective d/o   Check Prolactin, HgbA1c, Lipids , TSH  Geodon 20 MG IM , Bendaryl 50 MG IM, Ativan 1 mg

## 2018-06-26 NOTE — Progress Notes (Signed)
Did not attend group 

## 2018-06-26 NOTE — Tx Team (Signed)
Initial Treatment Plan 06/26/2018 6:52 PM [name] [id number]    PATIENT STRESSORS: Medication change or noncompliance Traumatic event   PATIENT STRENGTHS: Average or above average intelligence Communication skills Supportive family/friends   PATIENT IDENTIFIED PROBLEMS: "work on my anxiety"  "I have panic attacks and depression"   Depression  Isolation  Suicide attempt              DISCHARGE CRITERIA:  Improved stabilization in mood, thinking, and/or behavior Need for constant or close observation no longer present Withdrawal symptoms are absent or subacute and managed without 24-hour nursing intervention  PRELIMINARY DISCHARGE PLAN: Outpatient therapy Return to previous living arrangement  PATIENT/FAMILY INVOLVEMENT: This treatment plan has been presented to and reviewed with the patient, STARKISHA TULLIS.  The patient and family have been given the opportunity to ask questions and make suggestions.  Zipporah Plants, RN 06/26/2018, 6:52 PM

## 2018-06-27 LAB — LIPID PANEL
Cholesterol: 196 mg/dL (ref 0–200)
HDL: 59 mg/dL (ref >40)
LDL Cholesterol: 114 mg/dL — ABNORMAL HIGH (ref 0–99)
Total CHOL/HDL Ratio: 3.3 RATIO
Triglycerides: 114 mg/dL (ref <150)
VLDL: 23 mg/dL (ref 0–40)

## 2018-06-27 LAB — TSH: TSH: 1.058 u[IU]/mL (ref 0.350–4.500)

## 2018-06-27 MED ORDER — GABAPENTIN 100 MG PO CAPS
100.0000 mg | ORAL_CAPSULE | Freq: Two times a day (BID) | ORAL | Status: DC
  Administered 2018-06-27 – 2018-06-28 (×2): 100 mg via ORAL
  Filled 2018-06-27 (×7): qty 1

## 2018-06-27 MED ORDER — ZIPRASIDONE HCL 20 MG PO CAPS
20.0000 mg | ORAL_CAPSULE | Freq: Two times a day (BID) | ORAL | Status: DC
  Administered 2018-06-27 – 2018-06-29 (×4): 20 mg via ORAL
  Filled 2018-06-27 (×10): qty 1

## 2018-06-27 MED ORDER — LORAZEPAM 0.5 MG PO TABS
0.5000 mg | ORAL_TABLET | Freq: Two times a day (BID) | ORAL | Status: DC
  Administered 2018-06-27 – 2018-06-29 (×4): 0.5 mg via ORAL
  Filled 2018-06-27 (×4): qty 1

## 2018-06-27 MED ORDER — LORAZEPAM 1 MG PO TABS
1.0000 mg | ORAL_TABLET | Freq: Once | ORAL | Status: AC | PRN
  Administered 2018-06-27: 1 mg via ORAL

## 2018-06-27 MED ORDER — ESTRADIOL 0.05 MG/24HR TD PTWK
0.0500 mg | MEDICATED_PATCH | TRANSDERMAL | Status: DC
  Filled 2018-06-27: qty 1

## 2018-06-27 MED ORDER — DULOXETINE HCL 30 MG PO CPEP
30.0000 mg | ORAL_CAPSULE | Freq: Every day | ORAL | Status: DC
  Administered 2018-06-28 – 2018-06-30 (×3): 30 mg via ORAL
  Filled 2018-06-27 (×4): qty 1

## 2018-06-27 NOTE — [facility] Suicide Risk Assessment (Signed)
[facility] Admission Suicide Risk Assessment   Nursing information obtained from:  Patient Demographic factors:  Caucasian, Unemployed Current Mental Status:  Suicidal ideation indicated by patient, Self-harm thoughts Loss Factors:  NA, Decline in physical health Historical Factors:  Prior suicide attempts, Impulsivity Risk Reduction Factors:  Sense of responsibility to family, Living with another person, especially a relative  Total Time spent with patient: 45 minutes Principal Problem: <principal problem not specified> Diagnosis:   Patient Active Problem List   Diagnosis Date Noted  . Post traumatic stress disorder (PTSD) [F43.10] 06/26/2018  . Generalized anxiety disorder [F41.1] 06/26/2018  . MDD (major depressive disorder), recurrent, severe, with psychosis ([place]) [F33.3] 06/26/2018  . Balance problem [R26.89] 01/28/2018  . Tremor [R25.1] 01/28/2018  . Tobacco abuse counseling [Z71.6] 07/25/2015  . Chest pain with moderate risk for cardiac etiology [R07.9] 06/26/2015  . Dizziness of unknown cause [R42] 06/26/2015  . Hyperglycemia [R73.9] 06/26/2015  . Paresthesia [R20.2] 06/20/2015  . Suicidal ideation [R45.851] 06/06/2013  . Major depressive disorder, recurrent, severe with psychotic features ([place]) [F33.3] 06/06/2013  . Alcohol addiction ([place]) [F10.20] 04/30/2013  . HEPATITIS C [B17.10] 06/30/2007  . ALLERGIC RHINITIS [J30.9] 06/30/2007  . ASTHMA [J45.909] 06/30/2007  . GERD [K21.9] 06/30/2007   Subjective Data:   Continued Clinical Symptoms:  Alcohol Use Disorder Identification Test Final Score (AUDIT): 0 The "Alcohol Use Disorders Identification Test", Guidelines for Use in Primary Care, Second Edition.  World Pharmacologist [facility]). Score between 0-7:  no or low risk or alcohol related problems. Score between 8-15:  moderate risk of alcohol related problems. Score between 16-19:  high risk of alcohol related problems. Score 20 or above:  warrants further diagnostic  evaluation for alcohol dependence and treatment.   CLINICAL FACTORS:  60, married, presents for worsening anxiety, depression, paranoid ideations, S/P  suicidal attempt by opiate overdose     Psychiatric Specialty Exam: Physical Exam  ROS  Blood pressure (!) 142/90, pulse 100, temperature 98.5 F (36.9 C), temperature source Oral, resp. rate 20, height 5\' 4"  (1.626 m), weight 57.6 kg, SpO2 100 %.Body mass index is 21.8 kg/m.  See admit note MSE                                                        COGNITIVE FEATURES THAT CONTRIBUTE TO RISK:  Closed-mindedness and Loss of executive function    SUICIDE RISK:   Moderate:  Frequent suicidal ideation with limited intensity, and duration, some specificity in terms of plans, no associated intent, good self-control, limited dysphoria/symptomatology, some risk factors present, and identifiable protective factors, including available and accessible social support.  PLAN OF CARE: Patient will be admitted to inpatient psychiatric unit for stabilization and safety. Will provide and encourage milieu participation. Provide medication management and maked adjustments as needed.  Will follow daily.    I certify that inpatient services furnished can reasonably be expected to improve the patient's condition.   Jenne Campus, MD 06/27/2018, 12:47 PM

## 2018-06-27 NOTE — Progress Notes (Signed)
1:1 Nursing Note  Pt observed in her room with MHT at bedside. Pt pleasant and inquisitive about medications. Pt has no complaints at this time.   support and encouragement provided. MHT with patient. Pt safe on the unit. Will continue to monitor.

## 2018-06-27 NOTE — Progress Notes (Signed)
Nursing 1:1 note:  D.  Pt up in hall, asking questions about medication.  Pt does not have progesterone and it is not supplied by pharmacy.  Pt pleasant, appears slightly confused at times.  Pt requested medication for sleep as well.  Pt did not attend evening wrap up group, remained in room, but did get up later for snack.  Pt made no roommate per Dr. Parke Poisson due to paranoia at this time.  Pt denies SI/HI/AVH at this time.  A.  Support and encouragement offered, medication given as ordered.  Called pharmacy with Pt's questions about progesterone and all questions answered.  R.  Pt remains safe on the unit, will continue to monitor.

## 2018-06-27 NOTE — Progress Notes (Signed)
Nursing 1:1 note  Pt laying in bed resting. Pt requested Gabapentin for her pain attributed to shingles. Pt provided education. Pt is talkative and smiling with interactions.  MHT at bedside. 1:1 continues. Pt safe on the unit. Will continue to monitor.

## 2018-06-27 NOTE — Progress Notes (Signed)
1:1 nursing note -  Patient is resting without difficulty. RR WNL, even and unlabored. NAD, no complaints.   1:1 obs in place with MHT at bedside.   Patient remains safe at present.

## 2018-06-27 NOTE — [facility] Group Notes (Signed)
[facility] Notes:  (Nursing)  Date:  06/27/2018  Time: 1:15 PM Type of Therapy:  Nurse Education  Participation Level:  Did Not Attend  Waymond Cera 06/27/2018, 2:12 PM

## 2018-06-27 NOTE — Progress Notes (Signed)
1:1 nursing note  Pt observed in room with MHT sitter. Pt in no distress and has no complaints. Pt ambulates with assistance stating she feels light headed. Pt provided medications and vaccinations per protocol and standing orders. Pt denies any si/hi/ah/vh and verbally agrees to approach staff if these become apparent.   1:1 obs in place with MHT in bedroom.   Pt safe on the unit. Will continue to monitor.
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
## 2018-06-27 NOTE — Progress Notes (Signed)
1:1 nursing note -     Patient observed resting in bed with eyes closed. RR WNL. NAD, no complaints voiced.   1:1 obs in place with MHT at bedside.  Patient remains safe at this time.
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
## 2018-06-27 NOTE — [facility] Group Notes (Signed)
LCSW Group Therapy Note  06/27/2018   10:00-11:00am   Type of Therapy and Topic:  Group Therapy: Anger Cues and Responses  Participation Level:  Did Not Attend   Description of Group:   In this group, patients learned how to recognize the physical, cognitive, emotional, and behavioral responses they have to anger-provoking situations.  They identified a recent time they became angry and how they reacted.  They analyzed how their reaction was possibly beneficial and how it was possibly unhelpful.  The group discussed a variety of healthier coping skills that could help with such a situation in the future.  Deep breathing was practiced briefly.  Therapeutic Goals: 1. Patients will remember their last incident of anger and how they felt emotionally and physically, what their thoughts were at the time, and how they behaved. 2. Patients will identify how their behavior at that time worked for them, as well as how it worked against them. 3. Patients will explore possible new behaviors to use in future anger situations. 4. Patients will learn that anger itself is normal and cannot be eliminated, and that healthier reactions can assist with resolving conflict rather than worsening situations.  Summary of Patient Progress:   Dis not attend Therapeutic Modalities:   Cognitive Behavioral Therapy  Rolanda Jay

## 2018-06-28 LAB — HEMOGLOBIN A1C
Hgb A1c MFr Bld: 5.6 % (ref 4.8–5.6)
Mean Plasma Glucose: 114.02 mg/dL

## 2018-06-28 MED ORDER — GABAPENTIN 100 MG PO CAPS
100.0000 mg | ORAL_CAPSULE | Freq: Three times a day (TID) | ORAL | Status: DC
  Administered 2018-06-28: 100 mg via ORAL
  Filled 2018-06-28 (×2): qty 1

## 2018-06-28 MED ORDER — GABAPENTIN 100 MG PO CAPS
100.0000 mg | ORAL_CAPSULE | Freq: Two times a day (BID) | ORAL | Status: DC
  Administered 2018-06-29: 100 mg via ORAL
  Filled 2018-06-28 (×5): qty 1

## 2018-06-28 MED ORDER — SALINE SPRAY 0.65 % NA SOLN
1.0000 | NASAL | Status: DC | PRN
  Filled 2018-06-28: qty 44

## 2018-06-28 MED ORDER — PROGESTERONE MICRONIZED 100 MG PO CAPS
100.0000 mg | ORAL_CAPSULE | Freq: Every day | ORAL | Status: DC
  Administered 2018-06-28 – 2018-07-02 (×5): 100 mg via ORAL

## 2018-06-28 NOTE — Progress Notes (Signed)
Adult Psychoeducational Group Note  Date:  06/28/2018 Time:  8:55 PM  Group Topic/Focus:  Wrap-Up Group:   The focus of this group is to help patients review their daily goal of treatment and discuss progress on daily workbooks.  Additional Comments:  Pt did not attend group.   Milus Glazier 06/28/2018, 8:55 PM

## 2018-06-28 NOTE — Progress Notes (Signed)
Nursing 1:1 note  Pt ate lunch but requested a vegetarian tray. Pt is now wearing a gait belt since she is still unsteady. Pt continues to be in good spirits. Pt only requests nicotine gum at this time.  Pt safe on the unit. MHT at bedside with patient. Will continue to monitor.

## 2018-06-28 NOTE — Progress Notes (Signed)
1:1 NURSING note:  D.  Pt resting in bed with eyes closed, respirations even and unlabored.  No distress noted.  A. 1:1 continued as ordered for Pt safety  R.  Pt remains safe on the unit, will continue to monitor.
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
## 2018-06-28 NOTE — Progress Notes (Addendum)
Nursing 1:1 note  Pt found in bed; pt compliant with medication administration. Pt improving in appearance and mood. Pt is bright and laughing with MHT. Pt denies any physical pain but still experiencing neuropathy. Pt denies any si/hi/ah/vh and verbally agrees to approach staff if these become apparent.   MHT at bedside with patient. 1:1 observation continues. Pt safe on the unit.

## 2018-06-28 NOTE — BHH Group Notes (Signed)
[place] LCSW Group Therapy Note  Date/Time:  06/28/2018 9:00-10:00 or 10:00-11:00AM  Type of Therapy and Topic:  Group Therapy:  Healthy and Unhealthy Supports  Participation Level:  Active   Description of Group:  Patients in this group were introduced to the idea of adding a variety of healthy supports to address the various needs in their lives.Patients discussed what additional healthy supports could be helpful in their recovery and wellness after discharge in order to prevent future hospitalizations.   An emphasis was placed on using counselor, doctor, therapy groups, 12-step groups, and problem-specific support groups to expand supports.  They also worked as a group on developing a specific plan for several patients to deal with unhealthy supports through [place], psychoeducation with loved ones, and even termination of relationships.   Therapeutic Goals:   1)  discuss importance of adding supports to stay well once out of the hospital  2)  compare healthy versus unhealthy supports and identify some examples of each  3)  generate ideas and descriptions of healthy supports that can be added  4)  offer mutual support about how to address unhealthy supports  5)  encourage active participation in and adherence to discharge plan    Summary of Patient Progress:  The patient stated that current healthy supports in her life is her husband but described as 33 years old and becoming frail. She is  reporting trust issues. The patient acknowledged that some of ideas may appear fantastical but they were real to her. Her tinnitus is distressing and is increasing her depression and feelings of hopelessness . She reports being isolated but fearful of going places that might be able to resolve her problems. The patient stated she planned to utilize professional helpers such psychiatrist and therapist to improve her ability to cope and manage symptoms. From there she will move on to the tinnitus.     Therapeutic Modalities:   Motivational Interviewing Brief Solution-Focused Therapy  Rolanda Jay

## 2018-06-28 NOTE — Progress Notes (Signed)
Nursing 1:1 Note  Pt in her room; pleasant and compliant with medications. Pt has no complaints. Pt cooperative with EKG. Pt denies any si/hi/ah/vh. Pt had a verbal order to be able to go to dinner but came back early because it was "too much". Pt safe on the unit. Will continue to monitor. MHT at bedside.

## 2018-06-28 NOTE — B[facility] Suicide Risk Assessment (Signed)
[place] INPATIENT:  Family/Significant Other Suicide Prevention Education  Suicide Prevention Education:  Education Completed;Sarah Reeves [phone]), the husband of the patient has been identified by the patient as the family member/significant other with whom the patient will be residing, and identified as the person(s) who will aid the patient in the event of a mental health crisis (suicidal ideations/suicide attempt).  With written consent from the patient, the family member/significant other has been provided the following suicide prevention education, prior to the and/or following the discharge of the patient.  The suicide prevention education provided includes the following:  Suicide risk factors  Suicide prevention and interventions  National Suicide Hotline telephone number  [facility] assessment telephone number  [facility] Emergency Assistance [place] and/or Residential Mobile Crisis Unit telephone number  Request made of family/significant other to:  Remove weapons (e.g., guns, rifles, knives), all items previously/currently identified as safety concern.    Remove drugs/medications (over-the-counter, prescriptions, illicit drugs), all items previously/currently identified as a safety concern.  The family member/significant other verbalizes understanding of the suicide prevention education information provided.  The family member/significant other agrees to remove the items of safety concern listed above.  Sarah Reeves 06/28/2018, 9:39 AM
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
## 2018-06-28 NOTE — Progress Notes (Signed)
1:1 Nursing note:  D.  Pt in room initially in bed, stated that she felt tired and wanted to go on to bed.  Night medications given as ordered.   After receiving phone call, Pt observed sitting up in dayroom talking with peers.  Pt did not get up to attend evening wrap up group.  Pt denies SI/HI/AVH at this time.  A.  Support and encouragement offered, Pt's family brought progesterone from home as requested, order received and appropriately revised from vaginal insert to by mouth as this is what Pt's home medication is.   R. Pt remains safe on the unit, will continue to monitor as ordered 1:1.

## 2018-06-28 NOTE — Progress Notes (Signed)
1:1 Nursing Note:    D.  Pt resting in bed with eyes closed, respirations even and unlabored, no distress noted.  A.  1:1 continued as ordered for Pt safety  R. Pt remains safe on the unit, will continue to monitor.

## 2018-06-29 LAB — PROLACTIN: Prolactin: 24 ng/mL — ABNORMAL HIGH (ref 4.8–23.3)

## 2018-06-29 MED ORDER — GABAPENTIN 100 MG PO CAPS
100.0000 mg | ORAL_CAPSULE | Freq: Three times a day (TID) | ORAL | Status: DC
  Administered 2018-06-29 – 2018-06-30 (×2): 100 mg via ORAL
  Filled 2018-06-29 (×5): qty 1

## 2018-06-29 MED ORDER — ZIPRASIDONE HCL 40 MG PO CAPS
40.0000 mg | ORAL_CAPSULE | Freq: Every day | ORAL | Status: DC
  Administered 2018-06-29: 40 mg via ORAL
  Filled 2018-06-29: qty 2
  Filled 2018-06-29 (×2): qty 1

## 2018-06-29 MED ORDER — ZIPRASIDONE HCL 20 MG PO CAPS
20.0000 mg | ORAL_CAPSULE | ORAL | Status: DC
  Administered 2018-06-30: 20 mg via ORAL
  Filled 2018-06-29 (×2): qty 1

## 2018-06-29 MED ORDER — LORAZEPAM 0.5 MG PO TABS
0.5000 mg | ORAL_TABLET | Freq: Every day | ORAL | Status: DC
  Administered 2018-06-30 – 2018-07-02 (×3): 0.5 mg via ORAL
  Filled 2018-06-29 (×3): qty 1

## 2018-06-29 NOTE — Progress Notes (Signed)
1:1 Nursing note  D.  Pt resting in bed with eyes closed, respirations even and unlabored.  No distress noted.   A.  1:1 continued as ordered for Pt's safety R.  Pt remains safe on unit

## 2018-06-29 NOTE — Progress Notes (Signed)
Recreation Therapy Notes  Date: 9.9.19 Time: 0930 Location: 300 [address] Dayroom  Group Topic: Stress Management  Goal Area(s) Addresses:  Patient will verbalize importance of using healthy stress management.  Patient will identify positive emotions associated with healthy stress management.   Intervention: Stress Management  Activity :  [employer].  LRT introduced the stress management technique of meditation.  Patients were to listen as meditation played to engage in the activity.  Education: Stress Management, Discharge Planning.   Education Outcome: Acknowledges edcuation/In group clarification offered/Needs additional education  Clinical Observations/Feedback: Pt did not attend group.    Victorino Sparrow, LRT/CTRS        Ria Comment, Kashonda Sarkisyan A 06/29/2018 12:12 PM

## 2018-06-29 NOTE — Progress Notes (Signed)
Patient ID: Sarah Reeves, female   DOB: 1961/02/09, 57 y.o.   MRN: [id number] D) Pt remains cooperative, pleasant on approach. Pt continues to c/o "feeling anxious" primarily due to worrying about her elderly husband at home alone she says. Pt is active in the milieu. Pt is ambulating well without assistance. Denies s.i. Contracts for safety. A) Level 1 obs supported for safety. Support and encouragement provided. Med ed reinforced. R) Cooperative.

## 2018-06-29 NOTE — Progress Notes (Signed)
1:1 Nursing note:  D.  Pt has been awake since approximately 0200 and has been talking most of night.  Pt with rapid speech, thoughts appear to be racing at times.  Pt continues to be paranoid about the TV, and was heard to tell peer in dayroom earlier that the TV will control her if she gets too close.  Pt states she can not take Trazodone for sleep because it " stuffs up my head" but would like to have something else considered for sleep.   A.  1:1 continued as ordered for Pt safety R.  Pt remains safe on ulnit
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
## 2018-06-29 NOTE — Progress Notes (Addendum)
Patient ID: Sarah Reeves, female   DOB: 1960-12-07, 57 y.o.   MRN: [id number]  D) Pt has been anxious, pleasant and cooperative on approach. Pt affect inconsistent with thought content. Sarah Reeves appears appropriate in thought content initially however on questioning pt thoughts are inconsistent with thought blocking present. Contracts for safety. A) Level 1 obs for safety, support and encouragement provided. Contract for safety. Med ed reinforced. R) Cooperative.

## 2018-06-29 NOTE — Tx Team (Signed)
Interdisciplinary Treatment and Diagnostic Plan Update  06/29/2018 Time of Session: [place] MRN: [id number]  Principal Diagnosis: <principal problem not specified>  Secondary Diagnoses: Active Problems:   MDD (major depressive disorder), recurrent, severe, with psychosis ([place])   Current Medications:  Current Facility-Administered Medications  Medication Dose Route Frequency Provider Last Rate Last Dose  . acetaminophen (TYLENOL) tablet 650 mg  650 mg Oral Q6H PRN Ethelene Hal, NP   650 mg at 06/28/18 1531  . alum & mag hydroxide-simeth (MAALOX/MYLANTA) 200-200-20 MG/5ML suspension 30 mL  30 mL Oral Q4H PRN Ethelene Hal, NP      . cholecalciferol (VITAMIN D) tablet 2,000 Units  2,000 Units Oral Daily Ethelene Hal, NP   2,000 Units at 06/29/18 [phone]  . diphenhydrAMINE (BENADRYL) injection 50 mg  50 mg Intramuscular Q6H PRN Laverle Hobby, PA-C   50 mg at 06/26/18 2149  . DULoxetine (CYMBALTA) DR capsule 30 mg  30 mg Oral Daily Cobos, Myer Peer, MD   30 mg at 06/29/18 0819  . [START ON 07/04/2018] estradiol (CLIMARA - Dosed in mg/24 hr) patch 0.05 mg  0.05 mg Transdermal Weekly Cobos, Fernando A, MD      . gabapentin (NEURONTIN) capsule 100 mg  100 mg Oral BID Cobos, Myer Peer, MD   100 mg at 06/29/18 0819  . loratadine (CLARITIN) tablet 10 mg  10 mg Oral Daily Ethelene Hal, NP   10 mg at 06/29/18 0820  . LORazepam (ATIVAN) tablet 0.5 mg  0.5 mg Oral BID Cobos, Myer Peer, MD   0.5 mg at 06/29/18 0821  . LORazepam (ATIVAN) tablet 1 mg  1 mg Oral Q6H PRN Cobos, Myer Peer, MD   1 mg at 06/28/18 2103  . magnesium hydroxide (MILK OF MAGNESIA) suspension 30 mL  30 mL Oral Daily PRN Ethelene Hal, NP   30 mL at 06/28/18 0618  . multivitamin with minerals tablet 1 tablet  1 tablet Oral Daily Cobos, Myer Peer, MD   1 tablet at 06/29/18 0819  . nicotine polacrilex (NICORETTE) gum 2 mg  2 mg Oral PRN Cobos, Myer Peer, MD   2 mg at 06/28/18  1850  . progesterone (PROMETRIUM) capsule 100 mg  100 mg Oral QHS Laverle Hobby, PA-C   100 mg at 06/28/18 2115  . sodium chloride (OCEAN) 0.65 % nasal spray 1 spray  1 spray Each Nare PRN Dara Hoyer, PA-C      . thiamine (VITAMIN B-1) tablet 100 mg  100 mg Oral Daily Cobos, Myer Peer, MD   100 mg at 06/29/18 0819  . vitamin C (ASCORBIC ACID) tablet 1,000 mg  1,000 mg Oral Daily Ethelene Hal, NP   1,000 mg at 06/29/18 3825  . ziprasidone (GEODON) capsule 20 mg  20 mg Oral BID WC Cobos, Myer Peer, MD   20 mg at 06/29/18 0539   Facility-Administered Medications Ordered in Other Encounters  Medication Dose Route Frequency Provider Last Rate Last Dose  . chlorhexidine (HIBICLENS) 4 % liquid 1 application  1 application Topical Once Autumn Messing III, MD      . chlorhexidine (HIBICLENS) 4 % liquid 1 application  1 application Topical Once Autumn Messing III, MD       PTA Medications: Medications Prior to Admission  Medication Sig Dispense Refill Last Dose  . Ascorbic Acid (VITAMIN C) 1000 MG tablet Take 1,000 mg by mouth daily.   06/24/2018 at Unknown time  . cetirizine (ZYRTEC)  10 MG tablet Take 1 tablet (10 mg total) by mouth daily.   06/24/2018 at Unknown time  . cholecalciferol (VITAMIN D) 1000 UNITS tablet Take 2,000 Units by mouth daily.    Past Week at Unknown time  . cyclobenzaprine (FLEXERIL) 10 MG tablet Take 10 mg by mouth 3 (three) times daily as needed for muscle spasms.   06/24/2018 at Unknown time  . estradiol (VIVELLE-DOT) 0.075 MG/24HR Place 1 patch onto the skin 2 (two) times a week.   Past Week at Unknown time  . gabapentin (NEURONTIN) 100 MG capsule Take 200 mg by mouth daily as needed (pain).   06/24/2018 at Unknown time  . HYDROcodone-acetaminophen (NORCO) 10-325 MG tablet 0.5 to 1 tablet every 6 hours as needed for pain.  0 06/24/2018 at Unknown time  . LORazepam (ATIVAN) 1 MG tablet Take 2 mg by mouth daily as needed for anxiety or sleep.    06/25/2018 at Unknown time  .  magnesium oxide (MAG-OX) 400 MG tablet Take 400 mg by mouth daily.   Past Week at Unknown time  . nitroGLYCERIN (NITROSTAT) 0.4 MG SL tablet Place 1 tablet (0.4 mg total) under the tongue every 5 (five) minutes as needed for chest pain. (Patient not taking: Reported on 06/25/2018) 25 tablet 12 Not Taking at Unknown time  . pregabalin (LYRICA) 75 MG capsule Take 150 mg by mouth daily.    06/25/2018 at Unknown time  . progesterone (ENDOMETRIN) 100 MG vaginal insert Place 100 mg vaginally at bedtime.   06/24/2018 at Unknown time  . promethazine (PHENERGAN) 25 MG tablet Take 25 mg by mouth 2 (two) times daily as needed.  0 Past Week at Unknown time  . Pseudoeph-Doxylamine-DM-APAP (NYQUIL PO) Take 10 mLs by mouth at bedtime as needed (sleep).   06/24/2018 at Unknown time  . valACYclovir (VALTREX) 500 MG tablet Take 500 mg by mouth 2 (two) times daily as needed (HERPES).   06/25/2018 at Unknown time  . zolpidem (AMBIEN) 10 MG tablet Take 10 mg by mouth at bedtime as needed for sleep.   Past Week at Unknown time    Patient Stressors: Medication change or noncompliance Traumatic event  Patient Strengths: Average or above average intelligence Communication skills Supportive family/friends  Treatment Modalities: Medication Management, Group therapy, Case management,  1 to 1 session with clinician, Psychoeducation, Recreational therapy.   Physician Treatment Plan for Primary Diagnosis: <principal problem not specified> Long Term Goal(s): Improvement in symptoms so as ready for discharge Improvement in symptoms so as ready for discharge   Short Term Goals: Ability to identify changes in lifestyle to reduce recurrence of condition will improve Ability to maintain clinical measurements within normal limits will improve Ability to identify changes in lifestyle to reduce recurrence of condition will improve Ability to verbalize feelings will improve Ability to disclose and discuss suicidal ideas Ability to  demonstrate self-control will improve Ability to identify and develop effective coping behaviors will improve Ability to maintain clinical measurements within normal limits will improve  Medication Management: Evaluate patient's response, side effects, and tolerance of medication regimen.  Therapeutic Interventions: 1 to 1 sessions, Unit Group sessions and Medication administration.  Evaluation of Outcomes: Progressing  Physician Treatment Plan for Secondary Diagnosis: Active Problems:   MDD (major depressive disorder), recurrent, severe, with psychosis ([place])  Long Term Goal(s): Improvement in symptoms so as ready for discharge Improvement in symptoms so as ready for discharge   Short Term Goals: Ability to identify changes in lifestyle to reduce recurrence  of condition will improve Ability to maintain clinical measurements within normal limits will improve Ability to identify changes in lifestyle to reduce recurrence of condition will improve Ability to verbalize feelings will improve Ability to disclose and discuss suicidal ideas Ability to demonstrate self-control will improve Ability to identify and develop effective coping behaviors will improve Ability to maintain clinical measurements within normal limits will improve     Medication Management: Evaluate patient's response, side effects, and tolerance of medication regimen.  Therapeutic Interventions: 1 to 1 sessions, Unit Group sessions and Medication administration.  Evaluation of Outcomes: Progressing   RN Treatment Plan for Primary Diagnosis: <principal problem not specified> Long Term Goal(s): Knowledge of disease and therapeutic regimen to maintain health will improve  Short Term Goals: Ability to identify and develop effective coping behaviors will improve and Compliance with prescribed medications will improve  Medication Management: RN will administer medications as ordered by provider, will assess and evaluate  patient's response and provide education to patient for prescribed medication. RN will report any adverse and/or side effects to prescribing provider.  Therapeutic Interventions: 1 on 1 counseling sessions, Psychoeducation, Medication administration, Evaluate responses to treatment, Monitor vital signs and CBGs as ordered, Perform/monitor CIWA, COWS, AIMS and Fall Risk screenings as ordered, Perform wound care treatments as ordered.  Evaluation of Outcomes: Progressing   LCSW Treatment Plan for Primary Diagnosis: <principal problem not specified> Long Term Goal(s): Safe transition to appropriate next level of care at discharge, Engage patient in therapeutic group addressing interpersonal concerns.  Short Term Goals: Engage patient in aftercare planning with referrals and resources, Increase social support and Increase skills for wellness and recovery  Therapeutic Interventions: Assess for all discharge needs, 1 to 1 time with Social worker, Explore available resources and support systems, Assess for adequacy in community support network, Educate family and significant other(s) on suicide prevention, Complete Psychosocial Assessment, Interpersonal group therapy.  Evaluation of Outcomes: Progressing   Progress in Treatment: Attending groups: Yes. Participating in groups: Yes. Taking medication as prescribed: Yes. Toleration medication: Yes. Family/Significant other contact made: Yes, individual(s) contacted:  husband Patient understands diagnosis: No. Discussing patient identified problems/goals with staff: Yes. Medical problems stabilized or resolved: Yes. Denies suicidal/homicidal ideation: Yes. Issues/concerns per patient self-inventory: No. Other: none  New problem(s) identified: No, Describe:  none  New Short Term/Long Term Goal(s):  Patient Goals:  "get better"  Discharge Plan or Barriers:   Reason for Continuation of Hospitalization: Delusions  Depression Medication  stabilization  Estimated Length of Stay: 2-4 days.  Attendees: Patient: Sarah Reeves 06/29/2018   Physician: Dr. Parke Poisson, MD 06/29/2018   Nursing: Mayra Neer, RN 06/29/2018   RN Care Manager: 06/29/2018   Social Worker: Lurline Idol, LCSW 06/29/2018   Recreational Therapist:  06/29/2018   Other:  06/29/2018   Other:  06/29/2018   Other: 06/29/2018     Scribe for Treatment Team: Joanne Chars, [place] 06/29/2018 1:23 PM

## 2018-06-29 NOTE — Progress Notes (Signed)
1:1 DAR Note D:  Sarah Reeves has been up and visible on the unit.  She denied SI/HI.  She talked about strange tactile hallucinations feeling "vibrations that other people do not feel."  She did report hearing "jingle jangle" in her ears as well as ringing.  1:1 sitter remains by her side. A:  1:1 sitter remains for safety.  1:1 with RN for support and encouragement.  Medications as ordered.  Q 15 minute checks maintained for safety.  Encouraged participation in group and unit activities.   R:  Sarah Reeves remains safe on the unit.  We will continue to monitor the progress towards her goals.

## 2018-06-29 NOTE — Progress Notes (Signed)
Patient ID: Sarah Reeves, female   DOB: 1960-11-09, 57 y.o.   MRN: [id number]  D) Pt is currently resting in bed with eyes closed. beathing even and unlabored. No distress noted. A) Level 1 obs continued for safety. R) Safety maintained.

## 2018-06-30 DIAGNOSIS — F333 Major depressive disorder, recurrent, severe with psychotic symptoms: Principal | ICD-10-CM

## 2018-06-30 MED ORDER — DULOXETINE HCL 20 MG PO CPEP
40.0000 mg | ORAL_CAPSULE | Freq: Every day | ORAL | Status: DC
  Administered 2018-07-01: 40 mg via ORAL
  Filled 2018-06-30 (×2): qty 2

## 2018-06-30 MED ORDER — LOPERAMIDE HCL 2 MG PO CAPS
2.0000 mg | ORAL_CAPSULE | ORAL | Status: DC | PRN
  Administered 2018-06-30: 2 mg via ORAL
  Filled 2018-06-30 (×2): qty 1

## 2018-06-30 MED ORDER — ZIPRASIDONE HCL 40 MG PO CAPS
40.0000 mg | ORAL_CAPSULE | ORAL | Status: DC
  Administered 2018-07-01 – 2018-07-03 (×3): 40 mg via ORAL
  Filled 2018-06-30 (×5): qty 1

## 2018-06-30 MED ORDER — GABAPENTIN 100 MG PO CAPS
200.0000 mg | ORAL_CAPSULE | Freq: Three times a day (TID) | ORAL | Status: DC
  Administered 2018-06-30 – 2018-07-02 (×6): 200 mg via ORAL
  Filled 2018-06-30 (×9): qty 2

## 2018-06-30 MED ORDER — ZIPRASIDONE HCL 60 MG PO CAPS
60.0000 mg | ORAL_CAPSULE | Freq: Every day | ORAL | Status: DC
  Administered 2018-06-30: 60 mg via ORAL
  Filled 2018-06-30 (×2): qty 1

## 2018-06-30 NOTE — Progress Notes (Addendum)
1:1 DAR Note: D:  Pt was resting with her eyes closed but then got up to go to the bathroom.  Sitter remains by her side.   A:  1:1 remains for safety. R:  Sarah Reeves remains safe on the unit.  We will continue to monitor the progress towards her goals.

## 2018-06-30 NOTE — Progress Notes (Signed)
1;1 Note Pt seen walking on the hallway with staff by her side, stated she was excising. Pt has been calm and cooperative with her treatment, took all her medication as schedule. No unwanted behavior observed or reported, remains on 1:1 for safety, will continue to monitor.

## 2018-06-30 NOTE — [facility] Group Notes (Signed)
Adult Psychoeducational Group Note  Date:  06/30/2018  Time: 3:15 PM  Group Topic/Focus: Self-Care  Participation Level:  Active  Participation Quality:  Appropriate and Attentive  Affect:  Appropriate  Cognitive:  Alert and Oriented  Insight: Improving  Engagement in Group:  Developing/Improving  Modes of Intervention:  Discussion and Education  Additional Comments:  Patient completed self-care assessment and contributed to the group discussion. Patient states she would like to work on leaving her house more often.  Sarah Reeves A [name] 06/30/2018 4:00 PM

## 2018-06-30 NOTE — Progress Notes (Signed)
1:1 DAR Note: D:  Sarah Reeves is resting with her eyes closed.  Breathing even and unlabored.  She appears to be asleep.  Sitter remains by her side. A:  1:1 continues to pt safety. R:  [name] remains safe on the unit.  We will continue to monitor.

## 2018-06-30 NOTE — Progress Notes (Signed)
Adult Psychoeducational Group Note  Date:  06/30/2018 Time:  6:31 PM  Group Topic/Focus:  Goals Group:   The focus of this group is to help patients establish daily goals to achieve during treatment and discuss how the patient can incorporate goal setting into their daily lives to aide in recovery.  Participation Level:  Active  Participation Quality:  Appropriate  Affect:  Appropriate  Cognitive:  Alert  Insight: Appropriate  Engagement in Group:  Engaged  Modes of Intervention:  Discussion  Additional Comments:  Pt attended group and participated in discussion.  Mikalia Fessel R Arnetta Odeh 06/30/2018, 6:31 PM

## 2018-06-30 NOTE — [facility] Group Notes (Signed)
LCSW Group Therapy Note 06/30/2018 11:17 AM  Type of Therapy and Topic: Group Therapy: Overcoming Obstacles  Participation Level: Did Not Attend  Description of Group:  In this group patients will be encouraged to explore what they see as obstacles to their own wellness and recovery. They will be guided to discuss their thoughts, feelings, and behaviors related to these obstacles. The group will process together ways to cope with barriers, with attention given to specific choices patients can make. Each patient will be challenged to identify changes they are motivated to make in order to overcome their obstacles. This group will be process-oriented, with patients participating in exploration of their own experiences as well as giving and receiving support and challenge from other group members.  Therapeutic Goals: 1. Patient will identify personal and current obstacles as they relate to admission. 2. Patient will identify barriers that currently interfere with their wellness or overcoming obstacles.  3. Patient will identify feelings, thought process and behaviors related to these barriers. 4. Patient will identify two changes they are willing to make to overcome these obstacles:   Summary of Patient Progress  Invited, chose not to attend.    Therapeutic Modalities:  Cognitive Behavioral Therapy Solution Focused Therapy Motivational Interviewing Relapse Prevention Therapy   Theresa Duty Clinical Social Worker

## 2018-06-30 NOTE — Progress Notes (Signed)
1:1 DAR Note: D:  Sarah Reeves has been up and visible on the unit.  Sitter remains by her side.  She denied SI/HI but continues to talk about strange vibrations.  She was noted walking around the unit and was steady.  She continues to complain about anxiety and informed her that she will be getting ativan at hs.   A:  1:1 continues for safety. R:  Sarah Reeves remains safe on the unit.

## 2018-06-30 NOTE — Progress Notes (Signed)
1:1 Note. Pt has been observed in the room seated on the bed talking to the staff. Pt stated she talked about forgiveness that made her feel better. No fall reported or observed this far, remains on 1:1 for safety, will continue to monitor.

## 2018-06-30 NOTE — Progress Notes (Signed)
Adult Psychoeducational Group Note  Date:  06/30/2018 Time:  2:18 AM  Group Topic/Focus:  Wrap-Up Group:   The focus of this group is to help patients review their daily goal of treatment and discuss progress on daily workbooks.  Participation Level:  Active  Participation Quality:  Appropriate  Affect:  Appropriate  Cognitive:  Appropriate  Insight: Good    Modes of Intervention:  Discussion  Additional Comments:  Patient was to communicate with MD about her MED. Patient has accomplished her goal and is looking forward to seeing how the new medication will work.   Winston Misner, [place] 06/30/2018, 2:18 AM

## 2018-06-30 NOTE — Progress Notes (Signed)
[facility] MD Progress Note  06/30/2018 11:03 AM Sarah Reeves  MRN:  [id number] Subjective: Patient is seen and examined.  Patient is a 57 year old female admitted on 9/7 with depression and psychotic features.  She was attempting suicide by overdosing on fentanyl patches.  She just stated at that time she could not "take it anymore".  She was quite paranoid on admission.  She felt as though she was being harassed, she felt as though people were planting drugs in her home to frame her, and she felt as though social media had been hacked.  This morning she stated she feels a bit better, but continues to ruminate about her paranoid delusions.  She talks about the people who are planning to harm her.  She stated that she felt safe "as long as I am in here".  She is psychomotor agitated.  She denied any suicidal or homicidal ideation currently. Principal Problem: <principal problem not specified> Diagnosis:   Patient Active Problem List   Diagnosis Date Noted  . Post traumatic stress disorder (PTSD) [F43.10] 06/26/2018  . Generalized anxiety disorder [F41.1] 06/26/2018  . MDD (major depressive disorder), recurrent, severe, with psychosis ([place]) [F33.3] 06/26/2018  . Balance problem [R26.89] 01/28/2018  . Tremor [R25.1] 01/28/2018  . Tobacco abuse counseling [Z71.6] 07/25/2015  . Chest pain with moderate risk for cardiac etiology [R07.9] 06/26/2015  . Dizziness of unknown cause [R42] 06/26/2015  . Hyperglycemia [R73.9] 06/26/2015  . Paresthesia [R20.2] 06/20/2015  . Suicidal ideation [R45.851] 06/06/2013  . Major depressive disorder, recurrent, severe with psychotic features ([place]) [F33.3] 06/06/2013  . Alcohol addiction ([place]) [F10.20] 04/30/2013  . HEPATITIS C [B17.10] 06/30/2007  . ALLERGIC RHINITIS [J30.9] 06/30/2007  . ASTHMA [J45.909] 06/30/2007  . GERD [K21.9] 06/30/2007   Total Time spent with patient: 20 minutes  Past Psychiatric History: See admission H&P  Past Medical History:  Past  Medical History:  Diagnosis Date  . Anxiety   . Asthma   . Complication of anesthesia    takes alot to sedated  . Depression   . Fibromyalgia   . GERD (gastroesophageal reflux disease)   . Hepatitis C    (Dr. Earlean Shawl) Treated with Harvoni March-May 2016  . Herpes simplex   . Hyperlipemia   . Insomnia   . Mental disorder   . Neuropathy   . PONV (postoperative nausea and vomiting)     Past Surgical History:  Procedure Laterality Date  . BREAST ENHANCEMENT SURGERY    . BREAST LUMPECTOMY WITH RADIOACTIVE SEED LOCALIZATION Left 12/26/2014   Procedure: LEFT BREAST LUMPECTOMY WITH RADIOACTIVE SEED LOCALIZATION;  Surgeon: Autumn Messing III, MD;  Location: [facility];  Service: General;  Laterality: Left;  . BREAST REDUCTION SURGERY    . COLONOSCOPY    . EXPLORATORY LAPAROTOMY    . NM MYOVIEW LTD  07/07/15   Normal Myocardial Perfusion Scan. Low risk lexiscan nuclear study with minimal insignificant breast attenuation and normal myocardial perfusion and function; EF 53% without wall motion abnormalities and normal systolic thickening  . TEMPOROMANDIBULAR JOINT SURGERY    . TUBAL LIGATION    . UPPER GI ENDOSCOPY     x5   Family History:  Family History  Problem Relation Age of Onset  . Diabetes Mother   . Lung disease Mother   . Heart disease Mother   . Breast cancer Mother   . Heart disease Father   . Colon cancer Father   . Hypothyroidism Brother   . Alzheimer's disease Maternal Aunt   .  Skin cancer Maternal Aunt   . Alzheimer's disease Maternal Grandmother    Family Psychiatric  History: See admission H&P Social History:  Social History   Substance and Sexual Activity  Alcohol Use No     Social History   Substance and Sexual Activity  Drug Use No   Comment: did have hx drug abuse as teen    Social History   Socioeconomic History  . Marital status: Married    Spouse name: Not on file  . Number of children: 0  . Years of education: 14  . Highest  education level: Not on file  Occupational History  . Occupation: Retired  Scientific laboratory technician  . Financial resource strain: Not on file  . Food insecurity:    Worry: Not on file    Inability: Not on file  . Transportation needs:    Medical: Not on file    Non-medical: Not on file  Tobacco Use  . Smoking status: Current Every Day Smoker    Packs/day: 0.50    Types: Cigarettes  . Smokeless tobacco: Never Used  Substance and Sexual Activity  . Alcohol use: No  . Drug use: No    Comment: did have hx drug abuse as teen  . Sexual activity: Yes    Birth control/protection: Surgical  Lifestyle  . Physical activity:    Days per week: Not on file    Minutes per session: Not on file  . Stress: Not on file  Relationships  . Social connections:    Talks on phone: Not on file    Gets together: Not on file    Attends religious service: Not on file    Active member of club or organization: Not on file    Attends meetings of clubs or organizations: Not on file    Relationship status: Not on file  Other Topics Concern  . Not on file  Social History Narrative   Patient is right handed.   Four cups caffeine per day.   Lives at home with husband.   Additional Social History:                         Sleep: Fair  Appetite:  Fair  Current Medications: Current Facility-Administered Medications  Medication Dose Route Frequency Provider Last Rate Last Dose  . acetaminophen (TYLENOL) tablet 650 mg  650 mg Oral Q6H PRN Ethelene Hal, NP   650 mg at 06/28/18 1531  . alum & mag hydroxide-simeth (MAALOX/MYLANTA) 200-200-20 MG/5ML suspension 30 mL  30 mL Oral Q4H PRN Ethelene Hal, NP      . cholecalciferol (VITAMIN D) tablet 2,000 Units  2,000 Units Oral Daily Ethelene Hal, NP   2,000 Units at 06/30/18 [phone]  . diphenhydrAMINE (BENADRYL) injection 50 mg  50 mg Intramuscular Q6H PRN Laverle Hobby, PA-C   50 mg at 06/26/18 2149  . [START ON 07/01/2018] DULoxetine  (CYMBALTA) DR capsule 40 mg  40 mg Oral Daily Sharma Covert, MD      . Derrill Memo ON 07/04/2018] estradiol (CLIMARA - Dosed in mg/24 hr) patch 0.05 mg  0.05 mg Transdermal Weekly Cobos, Fernando A, MD      . gabapentin (NEURONTIN) capsule 200 mg  200 mg Oral TID Sharma Covert, MD      . loperamide (IMODIUM) capsule 2 mg  2 mg Oral PRN Lindon Romp A, NP      . loratadine (CLARITIN) tablet 10 mg  10 mg Oral Daily Ethelene Hal, NP   10 mg at 06/30/18 0815  . LORazepam (ATIVAN) tablet 0.5 mg  0.5 mg Oral QHS Cobos, Fernando A, MD      . magnesium hydroxide (MILK OF MAGNESIA) suspension 30 mL  30 mL Oral Daily PRN Ethelene Hal, NP   30 mL at 06/28/18 0618  . multivitamin with minerals tablet 1 tablet  1 tablet Oral Daily Cobos, Myer Peer, MD   1 tablet at 06/30/18 0815  . nicotine polacrilex (NICORETTE) gum 2 mg  2 mg Oral PRN Cobos, Myer Peer, MD   2 mg at 06/29/18 2136  . progesterone (PROMETRIUM) capsule 100 mg  100 mg Oral QHS Laverle Hobby, PA-C   100 mg at 06/29/18 2118  . sodium chloride (OCEAN) 0.65 % nasal spray 1 spray  1 spray Each Nare PRN Dara Hoyer, PA-C      . thiamine (VITAMIN B-1) tablet 100 mg  100 mg Oral Daily Cobos, Myer Peer, MD   100 mg at 06/30/18 0815  . vitamin C (ASCORBIC ACID) tablet 1,000 mg  1,000 mg Oral Daily Ethelene Hal, NP   1,000 mg at 06/30/18 5035  . [START ON 07/01/2018] ziprasidone (GEODON) capsule 40 mg  40 mg Oral Liane Comber, MD      . ziprasidone (GEODON) capsule 60 mg  60 mg Oral QHS Sharma Covert, MD       Facility-Administered Medications Ordered in Other Encounters  Medication Dose Route Frequency Provider Last Rate Last Dose  . chlorhexidine (HIBICLENS) 4 % liquid 1 application  1 application Topical Once Autumn Messing III, MD      . chlorhexidine (HIBICLENS) 4 % liquid 1 application  1 application Topical Once Autumn Messing III, MD        Lab Results: No results found for this or any previous  visit (from the past 65 hour(s)).  Blood Alcohol level:  Lab Results  Component Value Date   ETH <10 06/25/2018   ETH 281 (H) 46/56/8127    Metabolic Disorder Labs: Lab Results  Component Value Date   HGBA1C 5.6 06/27/2018   MPG 114.02 06/27/2018   Lab Results  Component Value Date   PROLACTIN 24.0 (H) 06/27/2018   Lab Results  Component Value Date   CHOL 196 06/27/2018   TRIG 114 06/27/2018   HDL 59 06/27/2018   CHOLHDL 3.3 06/27/2018   VLDL 23 06/27/2018   LDLCALC 114 (H) 06/27/2018    Physical Findings: AIMS: Facial and Oral Movements Muscles of Facial Expression: None, normal Lips and Perioral Area: None, normal Jaw: None, normal Tongue: None, normal,Extremity Movements Upper (arms, wrists, hands, fingers): None, normal Lower (legs, knees, ankles, toes): None, normal, Trunk Movements Neck, shoulders, hips: None, normal, Overall Severity Severity of abnormal movements (highest score from questions above): None, normal Incapacitation due to abnormal movements: None, normal Patient's awareness of abnormal movements (rate only patient's report): No Awareness, Dental Status Current problems with teeth and/or dentures?: No Does patient usually wear dentures?: No  CIWA:  CIWA-Ar Total: 1 COWS:     Musculoskeletal: Strength & Muscle Tone: within normal limits Gait & Station: normal Patient leans: N/A  Psychiatric Specialty Exam: Physical Exam  Nursing note and vitals reviewed. Constitutional: She is oriented to person, place, and time. She appears well-developed and well-nourished.  HENT:  Head: Normocephalic and atraumatic.  Respiratory: Effort normal.  Neurological: She is alert and oriented to person, place, and time.  ROS  Blood pressure 90/63, pulse 97, temperature 98.2 F (36.8 C), temperature source Oral, resp. rate 18, height 5\' 4"  (1.626 m), weight 57.6 kg, SpO2 100 %.Body mass index is 21.8 kg/m.  General Appearance: Casual  Eye Contact:  Good   Speech:  Pressured  Volume:  Normal  Mood:  Anxious and Dysphoric  Affect:  Congruent  Thought Process:  Coherent and Descriptions of Associations: Loose  Orientation:  Full (Time, Place, and Person)  Thought Content:  Delusions  Suicidal Thoughts:  No  Homicidal Thoughts:  No  Memory:  Immediate;   Fair Recent;   Fair Remote;   Fair  Judgement:  Impaired  Insight:  Lacking  Psychomotor Activity:  Increased  Concentration:  Concentration: Fair and Attention Span: Fair  Recall:  [employer] of Knowledge:  Fair  Language:  Fair  Akathisia:  Negative  Handed:  Right  AIMS (if indicated):     Assets:  Communication Skills Desire for Improvement Financial Resources/Insurance Housing Intimacy Physical Health Resilience Social Support Talents/Skills  ADL's:  Intact  Cognition:  WNL  Sleep:  Number of Hours: 5     Treatment Plan Summary: Daily contact with patient to assess and evaluate symptoms and progress in treatment, Medication management and Plan : Patient is seen and examined.  Patient is a 57 year old female seen in follow-up. 1.  Major depression, severe with psychotic features-increase ziprasidone to 40 mg p.o. daily and 60 mg p.o. every afternoon due to continued psychotic symptoms, also increase Cymbalta to 40 mg p.o. daily. 2.  Neuropathy-increase gabapentin to 200 mg p.o. 3 times daily, 3.  Anxiety-continue lorazepam 0.5 mg p.o. nightly.  Sharma Covert, MD 06/30/2018, 11:03 AM

## 2018-06-30 NOTE — Plan of Care (Signed)
  Problem: Activity: Goal: Interest or engagement in activities will improve Outcome: Progressing   Problem: Coping: Goal: Ability to verbalize frustrations and anger appropriately will improve Outcome: Progressing   Problem: Safety: Goal: Periods of time without injury will increase Outcome: Progressing

## 2018-06-30 NOTE — Progress Notes (Signed)
1:1 DAR Note: D:  Sarah Reeves has been up in the hallway with sitter by side.  She denied SI/HI.  She stated today was ok but no other issues voiced.  She took her hs medications without difficulty.  She is currently laying down in her room.   A:  1:1 continues for safety. R:  Sarah Reeves remains safe on the unit.

## 2018-06-30 NOTE — Progress Notes (Signed)
1:1 DAR Note: D:  Sarah Reeves was up early this morning.  She denied SI/HI.  She continues to be bizarre but pleasant.  Sitter remains by her side.   A:  1:1 continues for safety.   R:  Sarah Reeves remains safe on the unit.

## 2018-07-01 MED ORDER — DULOXETINE HCL 60 MG PO CPEP
60.0000 mg | ORAL_CAPSULE | Freq: Every day | ORAL | Status: DC
  Administered 2018-07-02: 60 mg via ORAL
  Filled 2018-07-01 (×3): qty 1

## 2018-07-01 MED ORDER — ZIPRASIDONE HCL 80 MG PO CAPS
80.0000 mg | ORAL_CAPSULE | Freq: Every day | ORAL | Status: DC
  Administered 2018-07-01 – 2018-07-02 (×2): 80 mg via ORAL
  Filled 2018-07-01 (×4): qty 1

## 2018-07-01 NOTE — Therapy (Signed)
Occupational Therapy Group Note  Date:  07/01/2018 Time:  2:54 PM  Group Topic/Focus:  Self Esteem Action Plan:   The focus of this group is to help patients create a plan to continue to build self-esteem after discharge.  Participation Level:  Minimal  Participation Quality:  Appropriate  Affect:  Flat  Cognitive:  Appropriate  Insight: Limited  Engagement in Group:  Engaged  Modes of Intervention:  Activity, Discussion, Education and Socialization  Additional Comments:     S: "Ineedtostay around positive people, and focus on my kids"   O:Education given on self esteem, its definition, and how it becomes negative vs positive. Self esteem education given on its relation to [place]. Pt encouraged to contribute in discussion and brainstorm. Self esteem activity completed where pt is to name a positive word for each letter of the alphabet (A-Z). Pt then to complete coat of arms activity to encompass roles, values, goals, and favorite qualities. Coloring encouraged within this activity. Positive affirmations worksheet given at end of session for pt to practice and continue building this skill.   A: Pt presents to group with flat affect, engaged and participatory throughout session with redirection. Pt only completed 50% of activity, needing significant VC's. Pt mildly disorganized and picking words not pertaining to the self, tangential. Accepted positive affirmations at end of session.   P: Handouts given to facilitate carryover into community.  Zenovia Jarred, MSOT, OTR/L  [place] 07/01/2018, 2:54 PM

## 2018-07-01 NOTE — Progress Notes (Signed)
1:1 DAR Note: D:  Sarah Reeves was up at [place] getting vital signs taken.  She continued to report difficulty falling asleep but sleeps good when she does fall asleep.  She denied SI/HI or A/V hallucinations.  She was walking steadily up the hall.  1:1 remains for safety. A:  1:1 remains for safety. R:  Sarah Reeves remains safe on the unit.  We will continue to monitor.

## 2018-07-01 NOTE — Plan of Care (Signed)
  Problem: Education: Goal: Emotional status will improve Outcome: Progressing   Problem: Education: Goal: Mental status will improve Outcome: Progressing   Problem: Education: Goal: Verbalization of understanding the information provided will improve Outcome: Progressing   Problem: Activity: Goal: Sleeping patterns will improve Outcome: Progressing

## 2018-07-01 NOTE — Plan of Care (Signed)
  Problem: Education: Goal: Mental status will improve Outcome: Progressing-She continues to report paranoia but denies SI/HI

## 2018-07-01 NOTE — Progress Notes (Signed)
Patient states that she had a "pleasant" day. She explained that she enjoyed the groups that were offered and that she would like to be discharged soon. She also mentioned that her medication is beginning to work . Her goal for tomorrow is to continue to work hard on her issues.

## 2018-07-01 NOTE — Progress Notes (Signed)
[facility] MD Progress Note  07/01/2018 10:06 AM Sarah Reeves  MRN:  [id number] Subjective: Patient is seen and examined.  Patient is a 57 year old female with a past psychiatric history significant for major depression; recurrent, severe with psychotic features.  She is seen in follow-up.  She stated she felt like she was doing a little bit better, but continues to have paranoia about home.  She stated she feels safe here, but is still gets tearful when talking about returning home and that how she is being harassed.  She states she tolerated the changes in the medications well yesterday, but is still having some problems sleeping.  She is less ruminating about the numbness and tingling in her arms or legs.  She denied any suicidal ideation.  She is less anxious today and less psychomotor agitated. Principal Problem: <principal problem not specified> Diagnosis:   Patient Active Problem List   Diagnosis Date Noted  . Post traumatic stress disorder (PTSD) [F43.10] 06/26/2018  . Generalized anxiety disorder [F41.1] 06/26/2018  . MDD (major depressive disorder), recurrent, severe, with psychosis ([place]) [F33.3] 06/26/2018  . Balance problem [R26.89] 01/28/2018  . Tremor [R25.1] 01/28/2018  . Tobacco abuse counseling [Z71.6] 07/25/2015  . Chest pain with moderate risk for cardiac etiology [R07.9] 06/26/2015  . Dizziness of unknown cause [R42] 06/26/2015  . Hyperglycemia [R73.9] 06/26/2015  . Paresthesia [R20.2] 06/20/2015  . Suicidal ideation [R45.851] 06/06/2013  . Major depressive disorder, recurrent, severe with psychotic features ([place]) [F33.3] 06/06/2013  . Alcohol addiction ([place]) [F10.20] 04/30/2013  . HEPATITIS C [B17.10] 06/30/2007  . ALLERGIC RHINITIS [J30.9] 06/30/2007  . ASTHMA [J45.909] 06/30/2007  . GERD [K21.9] 06/30/2007   Total Time spent with patient: 15 minutes  Past Psychiatric History: See admission H&P  Past Medical History:  Past Medical History:  Diagnosis Date  . Anxiety    . Asthma   . Complication of anesthesia    takes alot to sedated  . Depression   . Fibromyalgia   . GERD (gastroesophageal reflux disease)   . Hepatitis C    (Dr. Earlean Shawl) Treated with Harvoni March-May 2016  . Herpes simplex   . Hyperlipemia   . Insomnia   . Mental disorder   . Neuropathy   . PONV (postoperative nausea and vomiting)     Past Surgical History:  Procedure Laterality Date  . BREAST ENHANCEMENT SURGERY    . BREAST LUMPECTOMY WITH RADIOACTIVE SEED LOCALIZATION Left 12/26/2014   Procedure: LEFT BREAST LUMPECTOMY WITH RADIOACTIVE SEED LOCALIZATION;  Surgeon: Autumn Messing III, MD;  Location: [place];  Service: General;  Laterality: Left;  . BREAST REDUCTION SURGERY    . COLONOSCOPY    . EXPLORATORY LAPAROTOMY    . NM MYOVIEW LTD  07/07/15   Normal Myocardial Perfusion Scan. Low risk lexiscan nuclear study with minimal insignificant breast attenuation and normal myocardial perfusion and function; EF 53% without wall motion abnormalities and normal systolic thickening  . TEMPOROMANDIBULAR JOINT SURGERY    . TUBAL LIGATION    . UPPER GI ENDOSCOPY     x5   Family History:  Family History  Problem Relation Age of Onset  . Diabetes Mother   . Lung disease Mother   . Heart disease Mother   . Breast cancer Mother   . Heart disease Father   . Colon cancer Father   . Hypothyroidism Brother   . Alzheimer's disease Maternal Aunt   . Skin cancer Maternal Aunt   . Alzheimer's disease Maternal Grandmother  Family Psychiatric  History: See admission H&P Social History:  Social History   Substance and Sexual Activity  Alcohol Use No     Social History   Substance and Sexual Activity  Drug Use No   Comment: did have hx drug abuse as teen    Social History   Socioeconomic History  . Marital status: Married    Spouse name: Not on file  . Number of children: 0  . Years of education: 79  . Highest education level: Not on file  Occupational History   . Occupation: Retired  Scientific laboratory technician  . Financial resource strain: Not on file  . Food insecurity:    Worry: Not on file    Inability: Not on file  . Transportation needs:    Medical: Not on file    Non-medical: Not on file  Tobacco Use  . Smoking status: Current Every Day Smoker    Packs/day: 0.50    Types: Cigarettes  . Smokeless tobacco: Never Used  Substance and Sexual Activity  . Alcohol use: No  . Drug use: No    Comment: did have hx drug abuse as teen  . Sexual activity: Yes    Birth control/protection: Surgical  Lifestyle  . Physical activity:    Days per week: Not on file    Minutes per session: Not on file  . Stress: Not on file  Relationships  . Social connections:    Talks on phone: Not on file    Gets together: Not on file    Attends religious service: Not on file    Active member of club or organization: Not on file    Attends meetings of clubs or organizations: Not on file    Relationship status: Not on file  Other Topics Concern  . Not on file  Social History Narrative   Patient is right handed.   Four cups caffeine per day.   Lives at home with husband.   Additional Social History:                         Sleep: Fair  Appetite:  Fair  Current Medications: Current Facility-Administered Medications  Medication Dose Route Frequency Provider Last Rate Last Dose  . acetaminophen (TYLENOL) tablet 650 mg  650 mg Oral Q6H PRN Ethelene Hal, NP   650 mg at 06/28/18 1531  . alum & mag hydroxide-simeth (MAALOX/MYLANTA) 200-200-20 MG/5ML suspension 30 mL  30 mL Oral Q4H PRN Ethelene Hal, NP      . cholecalciferol (VITAMIN D) tablet 2,000 Units  2,000 Units Oral Daily Ethelene Hal, NP   2,000 Units at 07/01/18 0810  . diphenhydrAMINE (BENADRYL) injection 50 mg  50 mg Intramuscular Q6H PRN Laverle Hobby, PA-C   50 mg at 06/26/18 2149  . [START ON 07/02/2018] DULoxetine (CYMBALTA) DR capsule 60 mg  60 mg Oral Daily Sharma Covert, MD      . Derrill Memo ON 07/04/2018] estradiol (CLIMARA - Dosed in mg/24 hr) patch 0.05 mg  0.05 mg Transdermal Weekly Cobos, Fernando A, MD      . gabapentin (NEURONTIN) capsule 200 mg  200 mg Oral TID Sharma Covert, MD   200 mg at 07/01/18 0810  . loperamide (IMODIUM) capsule 2 mg  2 mg Oral PRN Lindon Romp A, NP   2 mg at 06/30/18 1239  . loratadine (CLARITIN) tablet 10 mg  10 mg Oral Daily Ethelene Hal, NP  10 mg at 07/01/18 0810  . LORazepam (ATIVAN) tablet 0.5 mg  0.5 mg Oral QHS Cobos, Myer Peer, MD   0.5 mg at 06/30/18 2113  . magnesium hydroxide (MILK OF MAGNESIA) suspension 30 mL  30 mL Oral Daily PRN Ethelene Hal, NP   30 mL at 06/28/18 0618  . multivitamin with minerals tablet 1 tablet  1 tablet Oral Daily Cobos, Myer Peer, MD   1 tablet at 07/01/18 0810  . nicotine polacrilex (NICORETTE) gum 2 mg  2 mg Oral PRN Cobos, Myer Peer, MD   2 mg at 06/30/18 2116  . progesterone (PROMETRIUM) capsule 100 mg  100 mg Oral QHS Laverle Hobby, PA-C   100 mg at 06/30/18 2114  . sodium chloride (OCEAN) 0.65 % nasal spray 1 spray  1 spray Each Nare PRN Dara Hoyer, PA-C      . thiamine (VITAMIN B-1) tablet 100 mg  100 mg Oral Daily Cobos, Myer Peer, MD   100 mg at 07/01/18 0810  . vitamin C (ASCORBIC ACID) tablet 1,000 mg  1,000 mg Oral Daily Ethelene Hal, NP   1,000 mg at 07/01/18 0810  . ziprasidone (GEODON) capsule 40 mg  40 mg Oral Liane Comber, MD   40 mg at 07/01/18 0810  . ziprasidone (GEODON) capsule 80 mg  80 mg Oral QHS Mallie Darting Cordie Grice, MD       Facility-Administered Medications Ordered in Other Encounters  Medication Dose Route Frequency Provider Last Rate Last Dose  . chlorhexidine (HIBICLENS) 4 % liquid 1 application  1 application Topical Once Autumn Messing III, MD      . chlorhexidine (HIBICLENS) 4 % liquid 1 application  1 application Topical Once Autumn Messing III, MD        Lab Results: No results found for this or any  previous visit (from the past 24 hour(s)).  Blood Alcohol level:  Lab Results  Component Value Date   ETH <10 06/25/2018   ETH 281 (H) 57/32/2025    Metabolic Disorder Labs: Lab Results  Component Value Date   HGBA1C 5.6 06/27/2018   MPG 114.02 06/27/2018   Lab Results  Component Value Date   PROLACTIN 24.0 (H) 06/27/2018   Lab Results  Component Value Date   CHOL 196 06/27/2018   TRIG 114 06/27/2018   HDL 59 06/27/2018   CHOLHDL 3.3 06/27/2018   VLDL 23 06/27/2018   LDLCALC 114 (H) 06/27/2018    Physical Findings: AIMS: Facial and Oral Movements Muscles of Facial Expression: None, normal Lips and Perioral Area: None, normal Jaw: None, normal Tongue: None, normal,Extremity Movements Upper (arms, wrists, hands, fingers): None, normal Lower (legs, knees, ankles, toes): None, normal, Trunk Movements Neck, shoulders, hips: None, normal, Overall Severity Severity of abnormal movements (highest score from questions above): None, normal Incapacitation due to abnormal movements: None, normal Patient's awareness of abnormal movements (rate only patient's report): No Awareness, Dental Status Current problems with teeth and/or dentures?: No Does patient usually wear dentures?: No  CIWA:  CIWA-Ar Total: 3 COWS:     Musculoskeletal: Strength & Muscle Tone: within normal limits Gait & Station: normal Patient leans: N/A  Psychiatric Specialty Exam: Physical Exam  Nursing note and vitals reviewed. Constitutional: She appears well-developed and well-nourished.  HENT:  Head: Normocephalic and atraumatic.  Respiratory: Effort normal.  Neurological: She is alert.    ROS  Blood pressure 103/73, pulse (!) 105, temperature 98.6 F (37 C), temperature source Oral, resp. rate 18,  height 5\' 4"  (1.626 m), weight 57.6 kg, SpO2 100 %.Body mass index is 21.8 kg/m.  General Appearance: Casual  Eye Contact:  Fair  Speech:  Normal Rate  Volume:  Decreased  Mood:  Anxious and  Depressed  Affect:  Congruent  Thought Process:  Coherent and Descriptions of Associations: Loose  Orientation:  Full (Time, Place, and Person)  Thought Content:  Delusions  Suicidal Thoughts:  No  Homicidal Thoughts:  No  Memory:  Immediate;   Fair Recent;   Fair Remote;   Fair  Judgement:  Impaired  Insight:  Fair  Psychomotor Activity:  Increased  Concentration:  Concentration: Fair and Attention Span: Fair  Recall:  [employer] of Knowledge:  Fair  Language:  Fair  Akathisia:  Negative  Handed:  Right  AIMS (if indicated):     Assets:  Communication Skills Desire for Improvement Financial Resources/Insurance Housing Intimacy Physical Health Resilience Social Support  ADL's:  Intact  Cognition:  WNL  Sleep:  Number of Hours: 4.5     Treatment Plan Summary: Daily contact with patient to assess and evaluate symptoms and progress in treatment, Medication management and Plan : Patient is seen and examined.  Patient is a 57 year old female who is seen in follow-up with a past psychiatric history as per above.  #1 major depression; severe with psychotic features-continue ziprasidone 40 mg p.o. daily and increase evening dose to 80 mg p.o. every afternoon.  Also increase Cymbalta to 60 mg p.o. daily for mood.  #2 neuropathy-continue gabapentin 200 mg p.o. 3 times daily.  #3 anxiety-continue lorazepam 0.5 mg p.o. nightly.  #4 discharge planning-in progress  Sharma Covert, MD 07/01/2018, 10:06 AM

## 2018-07-01 NOTE — Progress Notes (Signed)
1:1 DAR Note: D:  Sarah Reeves had finally fallen asleep around 115am.  No complaints voiced.  Sitter remains by her side. A:  1:1 continues for safety. R:  Sarah Reeves remains safe on the unit.  We will continue to monitor.

## 2018-07-01 NOTE — Progress Notes (Signed)
1:1 Progress note Patient appears to be in no distress; respirations are even and unlabored. Patient is in no current distress. Sitter remains by patient's side within reach. Will continue to monitor.

## 2018-07-01 NOTE — Progress Notes (Signed)
Recreation Therapy Notes  Date: 9.11.19 Time: 0930 Location: [address] Dayroom  Group Topic: Stress Management  Goal Area(s) Addresses:  Patient will verbalize importance of using healthy stress management.  Patient will identify positive emotions associated with healthy stress management.   Intervention: Stress Management  Activity :  Guided Imagery.  LRT introduced the stress management technique of guided imagery.  LRT read a script for patients to envision seeing a starry sky at night.  Patients were to follow along as script was read to engage in activity.  Education:  Stress Management, Discharge Planning.   Education Outcome: Acknowledges edcuation/In group clarification offered/Needs additional education  Clinical Observations/Feedback: Pt did not attend group.    Victorino Sparrow, LRT/CTRS         Victorino Sparrow A 07/01/2018 12:02 PM

## 2018-07-01 NOTE — [facility] Group Notes (Signed)
[facility] Mental Health Association Group Therapy 07/01/2018 1:15pm  Type of Therapy: Mental Health Association Presentation  Participation Level: Active  Participation Quality: Attentive  Affect: Appropriate  Cognitive: Oriented  Insight: Developing/Improving  Engagement in Therapy: Engaged  Modes of Intervention: Discussion, Education and Socialization  Summary of Progress/Problems: [place] ([place]) Speaker came to talk about his personal journey with mental health. The pt processed ways by which to relate to the speaker. [place] speaker provided handouts and educational information pertaining to groups and services offered by the [facility]. Pt was engaged in speaker's presentation and was receptive to resources provided.    Avelina Laine, LCSW 07/01/2018 9:19 AM

## 2018-07-01 NOTE — Progress Notes (Signed)
1:1 DAR Note: D:  Sarah Reeves has been up and visible on the unit.  She attended evening wrap up group.  She reported having a good day.  She is worried about sleeping tonight and encouraged her to take her hs medication then try to not get up and walk about the room and keep conversation to a minimum with the sitter.  She ambulates well on the unit.  Sitter remains by her side.  She denied SI/HI or A/V hallucinations. A:  1:1 remains for safety.   R:  [name] remains safe on the unit.  We will continue to monitor the progress towards her goals.

## 2018-07-01 NOTE — Progress Notes (Signed)
Adult Psychoeducational Group Note  Date:  07/01/2018 Time:  1:55 AM  Group Topic/Focus:  Wrap-Up Group:   The focus of this group is to help patients review their daily goal of treatment and discuss progress on daily workbooks.  Participation Level:  Active  Participation Quality:  Appropriate  Affect:  Appropriate  Cognitive:  Appropriate  Insight: Improving  Engagement in Group:  Developing/Improving  Modes of Intervention:  Discussion  Additional Comments:  Pt rated her over all day was a 10. Pt stated she had two goals to accomplished today. The first was to attend all groups and program. Second was to get off the 1:1. Pt stated she accomplished the first goal. Pt stated she attend all groups held today. Pt stated she felt she was ready to be discharged lately this week.  Candy Sledge 07/01/2018, 1:55 AM

## 2018-07-02 MED ORDER — GABAPENTIN 300 MG PO CAPS
300.0000 mg | ORAL_CAPSULE | Freq: Three times a day (TID) | ORAL | Status: DC
  Administered 2018-07-02 (×2): 300 mg via ORAL
  Filled 2018-07-02 (×6): qty 1

## 2018-07-02 NOTE — [facility] Group Notes (Signed)
[facility] LCSW Group Therapy Note  Date/Time: 07/02/18, 1315  Type of Therapy/Topic:  Group Therapy:  Balance in Life  Participation Level:  active  Description of Group:    This group will address the concept of balance and how it feels and looks when one is unbalanced. Patients will be encouraged to process areas in their lives that are out of balance, and identify reasons for remaining unbalanced. Facilitators will guide patients utilizing problem- solving interventions to address and correct the stressor making their life unbalanced. Understanding and applying boundaries will be explored and addressed for obtaining  and maintaining a balanced life. Patients will be encouraged to explore ways to assertively make their unbalanced needs known to significant others in their lives, using other group members and facilitator for support and feedback.  Therapeutic Goals: 1. Patient will identify two or more emotions or situations they have that consume much of in their lives. 2. Patient will identify signs/triggers that life has become out of balance:  3. Patient will identify two ways to set boundaries in order to achieve balance in their lives:  4. Patient will demonstrate ability to communicate their needs through discussion and/or role plays  Summary of Patient Progress:Pt shared that "fear of people hurting me" is consuming her life currently. Pt very active in group discussion regarding ways to set healthy boundaries in order to maintain or regain balance in life.          Therapeutic Modalities:   Cognitive Behavioral Therapy Solution-Focused Therapy Assertiveness Training  Lurline Idol, [place]

## 2018-07-02 NOTE — Progress Notes (Signed)
[facility] 1:1 Observation Documentation  Time 1:1 discontinued:12:30 PM  Patient's Behavior:Appropriate  Patient's Condition:Stable  Patient's Conversation:Appropriate; unremarkable  Sarah Reeves 07/02/2018,4:30 P.M.

## 2018-07-02 NOTE — Progress Notes (Signed)
[facility] 1:1 Observation Documentation  Time 1:1 discontinued:  10:30 AM  Patient's Behavior:  Appropriate  Patient's Condition:  Stable  Patient's Conversation:  Appropriate; unremarkable  Megan Mans 07/02/2018, 10:30 A.M.

## 2018-07-02 NOTE — Progress Notes (Signed)
[facility] MD Progress Note  07/02/2018 10:35 AM Sarah Reeves  MRN:  [id number] Subjective: Patient is seen and examined.  Patient is a 57 year old female with a past psychiatric history significant for major depression; recurrent, severe with psychotic features versus an underlying thought disorder.  She is seen in follow-up.  She continues to slowly improved.  She is less focused on the paranoid ideation that she has about her home and those people being against her.  She is able to smile and engage.  We have been able to stop the one-to-one.  She slept better with the increased dose of the Geodon.  She denied any side effects to her current medications.  She continues to have some numbness in her hands and feet, and we discussed increasing her gabapentin slightly.  She denied any suicidal ideation today. Principal Problem: <principal problem not specified> Diagnosis:   Patient Active Problem List   Diagnosis Date Noted  . Post traumatic stress disorder (PTSD) [F43.10] 06/26/2018  . Generalized anxiety disorder [F41.1] 06/26/2018  . MDD (major depressive disorder), recurrent, severe, with psychosis ([place]) [F33.3] 06/26/2018  . Balance problem [R26.89] 01/28/2018  . Tremor [R25.1] 01/28/2018  . Tobacco abuse counseling [Z71.6] 07/25/2015  . Chest pain with moderate risk for cardiac etiology [R07.9] 06/26/2015  . Dizziness of unknown cause [R42] 06/26/2015  . Hyperglycemia [R73.9] 06/26/2015  . Paresthesia [R20.2] 06/20/2015  . Suicidal ideation [R45.851] 06/06/2013  . Major depressive disorder, recurrent, severe with psychotic features ([place]) [F33.3] 06/06/2013  . Alcohol addiction ([place]) [F10.20] 04/30/2013  . HEPATITIS C [B17.10] 06/30/2007  . ALLERGIC RHINITIS [J30.9] 06/30/2007  . ASTHMA [J45.909] 06/30/2007  . GERD [K21.9] 06/30/2007   Total Time spent with patient: 15 minutes  Past Psychiatric History: See admission H&P  Past Medical History:  Past Medical History:  Diagnosis Date  .  Anxiety   . Asthma   . Complication of anesthesia    takes alot to sedated  . Depression   . Fibromyalgia   . GERD (gastroesophageal reflux disease)   . Hepatitis C    (Dr. Earlean Shawl) Treated with Harvoni March-May 2016  . Herpes simplex   . Hyperlipemia   . Insomnia   . Mental disorder   . Neuropathy   . PONV (postoperative nausea and vomiting)     Past Surgical History:  Procedure Laterality Date  . BREAST ENHANCEMENT SURGERY    . BREAST LUMPECTOMY WITH RADIOACTIVE SEED LOCALIZATION Left 12/26/2014   Procedure: LEFT BREAST LUMPECTOMY WITH RADIOACTIVE SEED LOCALIZATION;  Surgeon: Autumn Messing III, MD;  Location: [employer];  Service: General;  Laterality: Left;  . BREAST REDUCTION SURGERY    . COLONOSCOPY    . EXPLORATORY LAPAROTOMY    . NM MYOVIEW LTD  07/07/15   Normal Myocardial Perfusion Scan. Low risk lexiscan nuclear study with minimal insignificant breast attenuation and normal myocardial perfusion and function; EF 53% without wall motion abnormalities and normal systolic thickening  . TEMPOROMANDIBULAR JOINT SURGERY    . TUBAL LIGATION    . UPPER GI ENDOSCOPY     x5   Family History:  Family History  Problem Relation Age of Onset  . Diabetes Mother   . Lung disease Mother   . Heart disease Mother   . Breast cancer Mother   . Heart disease Father   . Colon cancer Father   . Hypothyroidism Brother   . Alzheimer's disease Maternal Aunt   . Skin cancer Maternal Aunt   . Alzheimer's disease Maternal  Grandmother    Family Psychiatric  History: See admission H&P Social History:  Social History   Substance and Sexual Activity  Alcohol Use No     Social History   Substance and Sexual Activity  Drug Use No   Comment: did have hx drug abuse as teen    Social History   Socioeconomic History  . Marital status: Married    Spouse name: Not on file  . Number of children: 0  . Years of education: 65  . Highest education level: Not on file  Occupational  History  . Occupation: Retired  Scientific laboratory technician  . Financial resource strain: Not on file  . Food insecurity:    Worry: Not on file    Inability: Not on file  . Transportation needs:    Medical: Not on file    Non-medical: Not on file  Tobacco Use  . Smoking status: Current Every Day Smoker    Packs/day: 0.50    Types: Cigarettes  . Smokeless tobacco: Never Used  Substance and Sexual Activity  . Alcohol use: No  . Drug use: No    Comment: did have hx drug abuse as teen  . Sexual activity: Yes    Birth control/protection: Surgical  Lifestyle  . Physical activity:    Days per week: Not on file    Minutes per session: Not on file  . Stress: Not on file  Relationships  . Social connections:    Talks on phone: Not on file    Gets together: Not on file    Attends religious service: Not on file    Active member of club or organization: Not on file    Attends meetings of clubs or organizations: Not on file    Relationship status: Not on file  Other Topics Concern  . Not on file  Social History Narrative   Patient is right handed.   Four cups caffeine per day.   Lives at home with husband.   Additional Social History:                         Sleep: Good  Appetite:  Fair  Current Medications: Current Facility-Administered Medications  Medication Dose Route Frequency Provider Last Rate Last Dose  . acetaminophen (TYLENOL) tablet 650 mg  650 mg Oral Q6H PRN Ethelene Hal, NP   650 mg at 06/28/18 1531  . alum & mag hydroxide-simeth (MAALOX/MYLANTA) 200-200-20 MG/5ML suspension 30 mL  30 mL Oral Q4H PRN Ethelene Hal, NP      . cholecalciferol (VITAMIN D) tablet 2,000 Units  2,000 Units Oral Daily Ethelene Hal, NP   2,000 Units at 07/02/18 0825  . diphenhydrAMINE (BENADRYL) injection 50 mg  50 mg Intramuscular Q6H PRN Laverle Hobby, PA-C   50 mg at 06/26/18 2149  . DULoxetine (CYMBALTA) DR capsule 60 mg  60 mg Oral Daily Sharma Covert,  MD   60 mg at 07/02/18 0825  . [START ON 07/04/2018] estradiol (CLIMARA - Dosed in mg/24 hr) patch 0.05 mg  0.05 mg Transdermal Weekly Cobos, Fernando A, MD      . gabapentin (NEURONTIN) capsule 300 mg  300 mg Oral TID Sharma Covert, MD      . loperamide (IMODIUM) capsule 2 mg  2 mg Oral PRN Lindon Romp A, NP   2 mg at 06/30/18 1239  . loratadine (CLARITIN) tablet 10 mg  10 mg Oral Daily Ethelene Hal,  NP   10 mg at 07/02/18 0825  . LORazepam (ATIVAN) tablet 0.5 mg  0.5 mg Oral QHS Cobos, Myer Peer, MD   0.5 mg at 07/01/18 2125  . magnesium hydroxide (MILK OF MAGNESIA) suspension 30 mL  30 mL Oral Daily PRN Ethelene Hal, NP   30 mL at 06/28/18 0618  . multivitamin with minerals tablet 1 tablet  1 tablet Oral Daily Cobos, Myer Peer, MD   1 tablet at 07/02/18 0825  . nicotine polacrilex (NICORETTE) gum 2 mg  2 mg Oral PRN Cobos, Myer Peer, MD   2 mg at 07/01/18 1945  . progesterone (PROMETRIUM) capsule 100 mg  100 mg Oral QHS Patriciaann Clan E, PA-C   100 mg at 07/01/18 2125  . sodium chloride (OCEAN) 0.65 % nasal spray 1 spray  1 spray Each Nare PRN Dara Hoyer, PA-C      . thiamine (VITAMIN B-1) tablet 100 mg  100 mg Oral Daily Cobos, Myer Peer, MD   100 mg at 07/02/18 0824  . vitamin C (ASCORBIC ACID) tablet 1,000 mg  1,000 mg Oral Daily Ethelene Hal, NP   1,000 mg at 07/02/18 8338  . ziprasidone (GEODON) capsule 40 mg  40 mg Oral Liane Comber, MD   40 mg at 07/02/18 0825  . ziprasidone (GEODON) capsule 80 mg  80 mg Oral QHS Sharma Covert, MD   80 mg at 07/01/18 2125   Facility-Administered Medications Ordered in Other Encounters  Medication Dose Route Frequency Provider Last Rate Last Dose  . chlorhexidine (HIBICLENS) 4 % liquid 1 application  1 application Topical Once Autumn Messing III, MD      . chlorhexidine (HIBICLENS) 4 % liquid 1 application  1 application Topical Once Autumn Messing III, MD        Lab Results: No results found for this  or any previous visit (from the past 92 hour(s)).  Blood Alcohol level:  Lab Results  Component Value Date   ETH <10 06/25/2018   ETH 281 (H) 25/02/3975    Metabolic Disorder Labs: Lab Results  Component Value Date   HGBA1C 5.6 06/27/2018   MPG 114.02 06/27/2018   Lab Results  Component Value Date   PROLACTIN 24.0 (H) 06/27/2018   Lab Results  Component Value Date   CHOL 196 06/27/2018   TRIG 114 06/27/2018   HDL 59 06/27/2018   CHOLHDL 3.3 06/27/2018   VLDL 23 06/27/2018   LDLCALC 114 (H) 06/27/2018    Physical Findings: AIMS: Facial and Oral Movements Muscles of Facial Expression: None, normal Lips and Perioral Area: None, normal Jaw: None, normal Tongue: None, normal,Extremity Movements Upper (arms, wrists, hands, fingers): None, normal Lower (legs, knees, ankles, toes): None, normal, Trunk Movements Neck, shoulders, hips: None, normal, Overall Severity Severity of abnormal movements (highest score from questions above): None, normal Incapacitation due to abnormal movements: None, normal Patient's awareness of abnormal movements (rate only patient's report): No Awareness, Dental Status Current problems with teeth and/or dentures?: No Does patient usually wear dentures?: No  CIWA:  CIWA-Ar Total: 0 COWS:     Musculoskeletal: Strength & Muscle Tone: within normal limits Gait & Station: normal Patient leans: N/A  Psychiatric Specialty Exam: Physical Exam  Nursing note and vitals reviewed. Constitutional: She is oriented to person, place, and time. She appears well-developed and well-nourished.  HENT:  Head: Normocephalic and atraumatic.  Respiratory: Effort normal.  Neurological: She is alert and oriented to person, place, and time.  ROS  Blood pressure (!) 112/59, pulse 92, temperature 98 F (36.7 C), temperature source Oral, resp. rate 18, height 5\' 4"  (1.626 m), weight 57.6 kg, SpO2 100 %.Body mass index is 21.8 kg/m.  General Appearance: Casual   Eye Contact:  Fair  Speech:  Normal Rate  Volume:  Normal  Mood:  Anxious  Affect:  Congruent  Thought Process:  Coherent and Descriptions of Associations: Intact  Orientation:  Full (Time, Place, and Person)  Thought Content:  Delusions  Suicidal Thoughts:  No  Homicidal Thoughts:  No  Memory:  Immediate;   Fair Recent;   Fair Remote;   Fair  Judgement:  Intact  Insight:  Fair  Psychomotor Activity:  Normal  Concentration:  Concentration: Fair and Attention Span: Fair  Recall:  [employer] of Knowledge:  Fair  Language:  Fair  Akathisia:  Negative  Handed:  Right  AIMS (if indicated):     Assets:  Communication Skills Desire for Improvement Financial Resources/Insurance Housing Intimacy Leisure Time Physical Health Resilience Social Support  ADL's:  Intact  Cognition:  WNL  Sleep:  Number of Hours: 6     Treatment Plan Summary: Daily contact with patient to assess and evaluate symptoms and progress in treatment, Medication management and Plan : Patient is seen and examined.  Patient is a 57 year old female with the above-stated past psychiatric history who is seen in follow-up.  #1-major depression; severe with psychotic features-continue ziprasidone 40 mg p.o. daily and 80 mg p.o. every afternoon.  Continue Cymbalta 60 mg p.o. daily.  #2 neuropathy-increase gabapentin to 300 mg p.o. 3 times daily.  #3 anxiety-continue lorazepam 0.5 mg p.o. every afternoon.  Also gabapentin 300 mg p.o. 3 times daily should help with this as well.  #4 discharge planning-plan for discharge home tomorrow if all goes well.  Sharma Covert, MD 07/02/2018, 10:35 AM

## 2018-07-02 NOTE — Progress Notes (Signed)
1:1 DAR Note: D:  Sarah Reeves is currently resting with her eyes closed.  She appears to be asleep.  Breathing even and unlabored.  Sitter remains by her side for fall risk. A:  1:1 remains for safety. R:  Sarah Reeves remains safe on the unit.  We will continue to monitor the progress towards her goals.

## 2018-07-02 NOTE — Progress Notes (Addendum)
1:1 Progress note Patient appears to be in no distress; respirations are even and unlabored. Patient is in no current distress. Sitter remains by patient's side within reach. Will continue to monitor.

## 2018-07-02 NOTE — Progress Notes (Signed)
[place] [facility] 1:1 Observation Documentation  Time 1:1 discontinued:12:30 PM  Patient's Behavior:Appropriate  Patient's Condition:Stable  Patient's Conversation:Appropriate; unremarkable  Sarah Reeves 07/02/2018,6:30 P.M.

## 2018-07-02 NOTE — [facility] Group Notes (Signed)
Adult Psychoeducational Group Note  Date:  07/02/2018 Time:  9:42 PM  Group Topic/Focus:  Wrap-Up Group:   The focus of this group is to help patients review their daily goal of treatment and discuss progress on daily workbooks.  Participation Level:  Active  Participation Quality:  Appropriate and Attentive  Affect:  Appropriate  Cognitive:  Alert and Appropriate  Insight: Appropriate and Good  Engagement in Group:  Engaged  Modes of Intervention:  Discussion and Education  Additional Comments:  Pt attended and participated in wrap up group this evening. Pt rated their day a 10/10, due to them being discharged tomorrow, even though they are feeling anxious about it. Pt completed their goal to go to groups and find coping skills for their everyday setbacks.    Cristi Loron 07/02/2018, 9:42 PM

## 2018-07-02 NOTE — Progress Notes (Signed)
[place] [facility] 1:1 Observation Documentation  Time 1:1 discontinued:12:30 PM  Patient's Behavior:Appropriate  Patient's Condition:Stable  Patient's Conversation:Appropriate; unremarkable  Sarah Reeves 07/02/2018,7:18 P.M.

## 2018-07-02 NOTE — Plan of Care (Signed)
  Problem: Education: Goal: Emotional status will improve Outcome: Progressing   Problem: Education: Goal: Mental status will improve Outcome: Progressing   Problem: Education: Goal: Verbalization of understanding the information provided will improve Outcome: Progressing   Problem: Activity: Goal: Interest or engagement in activities will improve Outcome: Progressing   Patient was pleasant upon approach. Patient states she is ready to be discharged and denies SI HI AVH. Tolerating medications well. Safety is maintained with 15 minute checks. Will continue to monitor.

## 2018-07-02 NOTE — Progress Notes (Signed)
1:1 DAR Note: D:  Sarah Reeves is up in the hallway with her sitter by her side.  She stated that she slept pretty good but woke up early.  She was complaining of buzzing/tinging in her ear.  She was able to ambulate without assistance but would sway a little when talking.   A:  1:1 remains for safety. R:  Desia remains safe on the unit.  We will continue to monitor the progress towards her goals.

## 2018-07-02 NOTE — Progress Notes (Signed)
Patient's 1:1 has been d/c'd per MD. Patient is in no distress and is safe, respirations are even and unlabored. Will continue to monitor.

## 2018-07-02 NOTE — Progress Notes (Addendum)
[place] [facility] 1:1 Observation Documentation  Time 1:1 discontinued:  12:30 PM  Patient's Behavior:  Appropriate  Patient's Condition:  Stable  Patient's Conversation:  Appropriate; unremarkable  Megan Mans 07/02/2018, 2:19 P.M.

## 2018-07-02 NOTE — Progress Notes (Signed)
CSW spoke with pt who reports she is not comfortable driving across town to [facility] for counseling.  Would prefer to do therapy and med mgmt at [facility].  [facility]. Winferd Humphrey, MSW, LCSW Clinical Social Worker 07/02/2018 2:53 PM

## 2018-07-03 MED ORDER — ZIPRASIDONE HCL 40 MG PO CAPS: 40.0000 mg | ORAL_CAPSULE | ORAL | 0 refills | Status: DC

## 2018-07-03 MED ORDER — GABAPENTIN 100 MG PO CAPS
200.0000 mg | ORAL_CAPSULE | Freq: Three times a day (TID) | ORAL | Status: DC
  Administered 2018-07-03: 200 mg via ORAL
  Filled 2018-07-03 (×6): qty 2

## 2018-07-03 MED ORDER — ZIPRASIDONE HCL 80 MG PO CAPS: 80.0000 mg | ORAL_CAPSULE | Freq: Every day | ORAL | 0 refills | Status: DC

## 2018-07-03 MED ORDER — GABAPENTIN 100 MG PO CAPS: 200.0000 mg | ORAL_CAPSULE | Freq: Three times a day (TID) | ORAL | 0 refills | Status: DC

## 2018-07-03 MED ORDER — DULOXETINE HCL 20 MG PO CPEP
40.0000 mg | ORAL_CAPSULE | Freq: Every day | ORAL | Status: DC
  Administered 2018-07-03: 40 mg via ORAL
  Filled 2018-07-03 (×3): qty 2

## 2018-07-03 MED ORDER — DULOXETINE HCL 40 MG PO CPEP: 40.0000 mg | ORAL_CAPSULE | Freq: Every day | ORAL | 0 refills | Status: DC

## 2018-07-03 NOTE — Progress Notes (Signed)
  [facility] Adult Case Management Discharge Plan :  Will you be returning to the same living situation after discharge:  Yes,  patient reports she is returing home with her husband At discharge, do you have transportation home?: Yes,  patient reports her husband will pick her up at discharge Do you have the ability to pay for your medications: Yes,  BCBS, support from husband, retirement income  Release of information consent forms completed and in the chart;  Patient's signature needed at discharge.  Patient to Follow up at: [facility]up [facility], [facility]. Go on 07/14/2018.   Why:  Please attend therapy intake appt on Tuesday, 07/14/18, at 10:00am.  Please attend your medication appt with Rosezella Rumpf on Wednesday, 07/15/18, at 9:30am.  Please contact [facility] within 24 hours of discharge to confirm and pay the $20 deposit. Contact information: [address] [place] [address] [phone]           Next level of care provider has access to [place] and Suicide Prevention discussed: Yes,  with the patient's husband  Have you used any form of tobacco in the last 30 days? (Cigarettes, Smokeless Tobacco, Cigars, and/or Pipes): Yes  Has patient been referred to the Quitline?: Patient refused referral  Patient has been referred for addiction treatment: N/A  Marylee Floras, [place] 07/03/2018, 11:25 AM

## 2018-07-03 NOTE — Tx Team (Signed)
Interdisciplinary Treatment and Diagnostic Plan Update  07/03/2018 Time of Session: [place] MRN: [id number]  Principal Diagnosis: <principal problem not specified>  Secondary Diagnoses: Active Problems:   MDD (major depressive disorder), recurrent, severe, with psychosis ([name])   Current Medications:  Current Facility-Administered Medications  Medication Dose Route Frequency Provider Last Rate Last Dose  . acetaminophen (TYLENOL) tablet 650 mg  650 mg Oral Q6H PRN Ethelene Hal, NP   650 mg at 06/28/18 1531  . alum & mag hydroxide-simeth (MAALOX/MYLANTA) 200-200-20 MG/5ML suspension 30 mL  30 mL Oral Q4H PRN Ethelene Hal, NP      . cholecalciferol (VITAMIN D) tablet 2,000 Units  2,000 Units Oral Daily Ethelene Hal, NP   2,000 Units at 07/03/18 0753  . diphenhydrAMINE (BENADRYL) injection 50 mg  50 mg Intramuscular Q6H PRN Laverle Hobby, PA-C   50 mg at 06/26/18 2149  . DULoxetine (CYMBALTA) DR capsule 40 mg  40 mg Oral Daily Sharma Covert, MD      . Derrill Memo ON 07/04/2018] estradiol (CLIMARA - Dosed in mg/24 hr) patch 0.05 mg  0.05 mg Transdermal Weekly Cobos, Fernando A, MD      . gabapentin (NEURONTIN) capsule 200 mg  200 mg Oral TID Sharma Covert, MD      . loperamide (IMODIUM) capsule 2 mg  2 mg Oral PRN Lindon Romp A, NP   2 mg at 06/30/18 1239  . loratadine (CLARITIN) tablet 10 mg  10 mg Oral Daily Ethelene Hal, NP   10 mg at 07/03/18 0753  . LORazepam (ATIVAN) tablet 0.5 mg  0.5 mg Oral QHS Cobos, Myer Peer, MD   0.5 mg at 07/02/18 2121  . magnesium hydroxide (MILK OF MAGNESIA) suspension 30 mL  30 mL Oral Daily PRN Ethelene Hal, NP   30 mL at 06/28/18 0618  . multivitamin with minerals tablet 1 tablet  1 tablet Oral Daily Cobos, Myer Peer, MD   1 tablet at 07/03/18 0752  . nicotine polacrilex (NICORETTE) gum 2 mg  2 mg Oral PRN Cobos, Myer Peer, MD   2 mg at 07/02/18 2123  . progesterone (PROMETRIUM) capsule 100  mg  100 mg Oral QHS Patriciaann Clan E, PA-C   100 mg at 07/02/18 2124  . sodium chloride (OCEAN) 0.65 % nasal spray 1 spray  1 spray Each Nare PRN Dara Hoyer, PA-C      . thiamine (VITAMIN B-1) tablet 100 mg  100 mg Oral Daily Cobos, Myer Peer, MD   100 mg at 07/03/18 0753  . vitamin C (ASCORBIC ACID) tablet 1,000 mg  1,000 mg Oral Daily Ethelene Hal, NP   1,000 mg at 07/03/18 6283  . ziprasidone (GEODON) capsule 40 mg  40 mg Oral Liane Comber, MD   40 mg at 07/03/18 0752  . ziprasidone (GEODON) capsule 80 mg  80 mg Oral QHS Sharma Covert, MD   80 mg at 07/02/18 2119   Facility-Administered Medications Ordered in Other Encounters  Medication Dose Route Frequency Provider Last Rate Last Dose  . chlorhexidine (HIBICLENS) 4 % liquid 1 application  1 application Topical Once Autumn Messing III, MD      . chlorhexidine (HIBICLENS) 4 % liquid 1 application  1 application Topical Once Autumn Messing III, MD       PTA Medications: Medications Prior to Admission  Medication Sig Dispense Refill Last Dose  . Ascorbic Acid (VITAMIN C) 1000 MG tablet Take  1,000 mg by mouth daily.   06/24/2018 at Unknown time  . cetirizine (ZYRTEC) 10 MG tablet Take 1 tablet (10 mg total) by mouth daily.   06/24/2018 at Unknown time  . cholecalciferol (VITAMIN D) 1000 UNITS tablet Take 2,000 Units by mouth daily.    Past Week at Unknown time  . cyclobenzaprine (FLEXERIL) 10 MG tablet Take 10 mg by mouth 3 (three) times daily as needed for muscle spasms.   06/24/2018 at Unknown time  . estradiol (VIVELLE-DOT) 0.075 MG/24HR Place 1 patch onto the skin 2 (two) times a week.   Past Week at Unknown time  . gabapentin (NEURONTIN) 100 MG capsule Take 200 mg by mouth daily as needed (pain).   06/24/2018 at Unknown time  . HYDROcodone-acetaminophen (NORCO) 10-325 MG tablet 0.5 to 1 tablet every 6 hours as needed for pain.  0 06/24/2018 at Unknown time  . LORazepam (ATIVAN) 1 MG tablet Take 2 mg by mouth daily as needed  for anxiety or sleep.    06/25/2018 at Unknown time  . magnesium oxide (MAG-OX) 400 MG tablet Take 400 mg by mouth daily.   Past Week at Unknown time  . nitroGLYCERIN (NITROSTAT) 0.4 MG SL tablet Place 1 tablet (0.4 mg total) under the tongue every 5 (five) minutes as needed for chest pain. (Patient not taking: Reported on 06/25/2018) 25 tablet 12 Not Taking at Unknown time  . pregabalin (LYRICA) 75 MG capsule Take 150 mg by mouth daily.    06/25/2018 at Unknown time  . progesterone (ENDOMETRIN) 100 MG vaginal insert Place 100 mg vaginally at bedtime.   06/24/2018 at Unknown time  . promethazine (PHENERGAN) 25 MG tablet Take 25 mg by mouth 2 (two) times daily as needed.  0 Past Week at Unknown time  . Pseudoeph-Doxylamine-DM-APAP (NYQUIL PO) Take 10 mLs by mouth at bedtime as needed (sleep).   06/24/2018 at Unknown time  . valACYclovir (VALTREX) 500 MG tablet Take 500 mg by mouth 2 (two) times daily as needed (HERPES).   06/25/2018 at Unknown time  . zolpidem (AMBIEN) 10 MG tablet Take 10 mg by mouth at bedtime as needed for sleep.   Past Week at Unknown time    Patient Stressors: Medication change or noncompliance Traumatic event  Patient Strengths: Average or above average intelligence Communication skills Supportive family/friends  Treatment Modalities: Medication Management, Group therapy, Case management,  1 to 1 session with clinician, Psychoeducation, Recreational therapy.   Physician Treatment Plan for Primary Diagnosis: <principal problem not specified> Long Term Goal(s): Improvement in symptoms so as ready for discharge Improvement in symptoms so as ready for discharge   Short Term Goals: Ability to identify changes in lifestyle to reduce recurrence of condition will improve Ability to maintain clinical measurements within normal limits will improve Ability to identify changes in lifestyle to reduce recurrence of condition will improve Ability to verbalize feelings will improve Ability to  disclose and discuss suicidal ideas Ability to demonstrate self-control will improve Ability to identify and develop effective coping behaviors will improve Ability to maintain clinical measurements within normal limits will improve  Medication Management: Evaluate patient's response, side effects, and tolerance of medication regimen.  Therapeutic Interventions: 1 to 1 sessions, Unit Group sessions and Medication administration.  Evaluation of Outcomes: Adequate for Discharge  Physician Treatment Plan for Secondary Diagnosis: Active Problems:   MDD (major depressive disorder), recurrent, severe, with psychosis ([place])  Long Term Goal(s): Improvement in symptoms so as ready for discharge Improvement in symptoms so as  ready for discharge   Short Term Goals: Ability to identify changes in lifestyle to reduce recurrence of condition will improve Ability to maintain clinical measurements within normal limits will improve Ability to identify changes in lifestyle to reduce recurrence of condition will improve Ability to verbalize feelings will improve Ability to disclose and discuss suicidal ideas Ability to demonstrate self-control will improve Ability to identify and develop effective coping behaviors will improve Ability to maintain clinical measurements within normal limits will improve     Medication Management: Evaluate patient's response, side effects, and tolerance of medication regimen.  Therapeutic Interventions: 1 to 1 sessions, Unit Group sessions and Medication administration.  Evaluation of Outcomes: Adequate for Discharge   RN Treatment Plan for Primary Diagnosis: <principal problem not specified> Long Term Goal(s): Knowledge of disease and therapeutic regimen to maintain health will improve  Short Term Goals: Ability to identify and develop effective coping behaviors will improve and Compliance with prescribed medications will improve  Medication Management: RN will  administer medications as ordered by provider, will assess and evaluate patient's response and provide education to patient for prescribed medication. RN will report any adverse and/or side effects to prescribing provider.  Therapeutic Interventions: 1 on 1 counseling sessions, Psychoeducation, Medication administration, Evaluate responses to treatment, Monitor vital signs and CBGs as ordered, Perform/monitor CIWA, COWS, AIMS and Fall Risk screenings as ordered, Perform wound care treatments as ordered.  Evaluation of Outcomes: Adequate for Discharge   LCSW Treatment Plan for Primary Diagnosis: <principal problem not specified> Long Term Goal(s): Safe transition to appropriate next level of care at discharge, Engage patient in therapeutic group addressing interpersonal concerns.  Short Term Goals: Engage patient in aftercare planning with referrals and resources, Increase social support and Increase skills for wellness and recovery  Therapeutic Interventions: Assess for all discharge needs, 1 to 1 time with Social worker, Explore available resources and support systems, Assess for adequacy in community support network, Educate family and significant other(s) on suicide prevention, Complete Psychosocial Assessment, Interpersonal group therapy.  Evaluation of Outcomes: Adequate for Discharge   Progress in Treatment: Attending groups: Yes. Participating in groups: Yes. Taking medication as prescribed: Yes. Toleration medication: Yes. Family/Significant other contact made: Yes, individual(s) contacted:  husband Patient understands diagnosis: No. Discussing patient identified problems/goals with staff: Yes. Medical problems stabilized or resolved: Yes. Denies suicidal/homicidal ideation: Yes. Issues/concerns per patient self-inventory: No. Other: none  New problem(s) identified: No, Describe:  none  New Short Term/Long Term Goal(s):medication stabilization, elimination of SI thoughts,  development of comprehensive mental wellness plan.    Patient Goals:  "get better"  Discharge Plan or Barriers: Patient is returning home and following up with [facility] for outpatient services. Patient also provided resources for [facility] PHP.   Reason for Continuation of Hospitalization: None  Estimated Length of Stay: Discharge 07/03/18  Attendees: Patient: Sarah Reeves 07/03/2018   Physician: Dr. Parke Poisson, MD; Dr. Myles Lipps, MD 07/03/2018   Nursing: Mayra Neer, RN; [place], RN 07/03/2018   RN Care Manager: 07/03/2018   Social Worker: Lurline Idol, LCSW; Radonna Ricker, [place] 07/03/2018   Recreational Therapist: X 07/03/2018   Other: X 07/03/2018   Other: X 07/03/2018   Other:X 07/03/2018     Scribe for Treatment Team: Marylee Floras, [place] 07/03/2018 8:32 AM

## 2018-07-03 NOTE — [facility] Suicide Risk Assessment (Signed)
[facility] Discharge Suicide Risk Assessment   Principal Problem: <principal problem not specified> Discharge Diagnoses:  Patient Active Problem List   Diagnosis Date Noted  . Post traumatic stress disorder (PTSD) [F43.10] 06/26/2018  . Generalized anxiety disorder [F41.1] 06/26/2018  . MDD (major depressive disorder), recurrent, severe, with psychosis ([place]) [F33.3] 06/26/2018  . Balance problem [R26.89] 01/28/2018  . Tremor [R25.1] 01/28/2018  . Tobacco abuse counseling [Z71.6] 07/25/2015  . Chest pain with moderate risk for cardiac etiology [R07.9] 06/26/2015  . Dizziness of unknown cause [R42] 06/26/2015  . Hyperglycemia [R73.9] 06/26/2015  . Paresthesia [R20.2] 06/20/2015  . Suicidal ideation [R45.851] 06/06/2013  . Major depressive disorder, recurrent, severe with psychotic features ([place]) [F33.3] 06/06/2013  . Alcohol addiction ([place]) [F10.20] 04/30/2013  . HEPATITIS C [B17.10] 06/30/2007  . ALLERGIC RHINITIS [J30.9] 06/30/2007  . ASTHMA [J45.909] 06/30/2007  . GERD [K21.9] 06/30/2007    Total Time spent with patient: 15 minutes  Musculoskeletal: Strength & Muscle Tone: within normal limits Gait & Station: normal Patient leans: N/A  Psychiatric Specialty Exam: Review of Systems  All other systems reviewed and are negative.   Blood pressure 114/74, pulse 97, temperature 98.5 F (36.9 C), temperature source Oral, resp. rate 20, height 5\' 4"  (1.626 m), weight 57.6 kg, SpO2 100 %.Body mass index is 21.8 kg/m.  General Appearance: Casual  Eye Contact::  Good  Speech:  Normal Rate409  Volume:  Normal  Mood:  Anxious  Affect:  Congruent  Thought Process:  Coherent and Descriptions of Associations: Intact  Orientation:  Full (Time, Place, and Person)  Thought Content:  Delusions  Suicidal Thoughts:  No  Homicidal Thoughts:  No  Memory:  Immediate;   Fair Recent;   Fair Remote;   Fair  Judgement:  Intact  Insight:  Fair  Psychomotor Activity:  Increased  Concentration:   Fair  Recall:  [employer] of Knowledge:Fair  Language: Fair  Akathisia:  Negative  Handed:  Right  AIMS (if indicated):     Assets:  Communication Skills Desire for Improvement Financial Resources/Insurance Housing Intimacy Leisure Time Physical Health Resilience Social Support  Sleep:  Number of Hours: 6.75  Cognition: WNL  ADL's:  Intact   Mental Status Per Nursing Assessment::   On Admission:  Suicidal ideation indicated by patient, Self-harm thoughts  Demographic Factors:  Caucasian  Loss Factors: NA  Historical Factors: Impulsivity  Risk Reduction Factors:   Sense of responsibility to family, Living with another person, especially a relative and Positive social support  Continued Clinical Symptoms:  Depression:   Impulsivity  Cognitive Features That Contribute To Risk:  None    Suicide Risk:  Minimal: No identifiable suicidal ideation.  Patients presenting with no risk factors but with morbid ruminations; may be classified as minimal risk based on the severity of the depressive symptoms  [facility]. Go on 07/14/2018.   Why:  Please attend therapy intake appt on Tuesday, 07/14/18, at 10:00am.  Please attend your medication appt with Rosezella Rumpf on Wednesday, 07/15/18, at 9:30am.  Please contact [facility] within 24 hours of discharge to confirm and pay the $20 deposit. Contact information: [address] [place] [address] [phone]           Plan Of Care/Follow-up recommendations:  Activity:  ad lib  Sharma Covert, MD 07/03/2018, 7:47 AM

## 2018-07-03 NOTE — Progress Notes (Signed)
Discharge note: Patient reviewed discharge paperwork with RN including prescriptions, follow up appointments, and lab work. Patient given the opportunity to ask questions. All concerns were addressed. All belongings were returned to patient. Denied SI/HI/AVH. Patient thanked staff for their care while at the hospital.  Patient was discharged to lobby where patient's husband was waiting to pick her up.
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
## 2018-07-03 NOTE — Plan of Care (Signed)
  Problem: Education: Goal: Mental status will improve Outcome: Adequate for Discharge   Problem: Education: Goal: Verbalization of understanding the information provided will improve Outcome: Adequate for Discharge   Problem: Activity: Goal: Interest or engagement in activities will improve Outcome: Adequate for Discharge   Patient says she is ready to go. Denies SI HI AVH. Denies any physical pain. Mildly anxious, but only because she is going back home.

## 2018-07-03 NOTE — Progress Notes (Signed)
D: Patient observed playing cards with peers in dayroom this evening. Patient states, "the doctor is trying to get my meds straight. I'm still real anxious. I might be leaving tomorrow." Patient's affect animated, mood anxious, speech pressured.  Denies pain, physical complaints during assessment.   A: Medicated per orders, prn nicorette given for cravings. Medication education provided. Level III obs in place for safety. Emotional support offered. Patient encouraged to complete Suicide Safety Plan before discharge. Encouraged to attend and participate in unit programming.  Fall prevention plan in place and reviewed with patient as pt is a high fall risk.   R: Patient verbalizes understanding of POC, falls prevention education. On reassess, patient reported decreased cravings. Patient denies SI/HI/AVH and remains safe on level III obs. Will continue to monitor throughout the night.

## 2018-07-08 ENCOUNTER — Ambulatory Visit (HOSPITAL_COMMUNITY): Payer: Self-pay | Admitting: Licensed Clinical Social Worker

## 2018-07-09 ENCOUNTER — Ambulatory Visit (HOSPITAL_COMMUNITY): Payer: BLUE CROSS/BLUE SHIELD | Admitting: Psychiatry

## 2018-07-09 DIAGNOSIS — F411 Generalized anxiety disorder: Secondary | ICD-10-CM

## 2018-07-09 DIAGNOSIS — F4310 Post-traumatic stress disorder, unspecified: Secondary | ICD-10-CM | POA: Diagnosis not present

## 2018-07-09 DIAGNOSIS — F333 Major depressive disorder, recurrent, severe with psychotic symptoms: Secondary | ICD-10-CM | POA: Diagnosis not present
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
## 2018-07-14 DIAGNOSIS — F4010 Social phobia, unspecified: Secondary | ICD-10-CM | POA: Diagnosis not present

## 2018-07-14 DIAGNOSIS — F411 Generalized anxiety disorder: Secondary | ICD-10-CM | POA: Diagnosis not present

## 2018-07-14 DIAGNOSIS — F410 Panic disorder [episodic paroxysmal anxiety] without agoraphobia: Secondary | ICD-10-CM | POA: Diagnosis not present

## 2018-07-14 DIAGNOSIS — F339 Major depressive disorder, recurrent, unspecified: Secondary | ICD-10-CM | POA: Diagnosis not present

## 2018-07-16 DIAGNOSIS — F411 Generalized anxiety disorder: Secondary | ICD-10-CM | POA: Diagnosis not present

## 2018-07-16 DIAGNOSIS — H04123 Dry eye syndrome of bilateral lacrimal glands: Secondary | ICD-10-CM | POA: Diagnosis not present

## 2018-07-16 DIAGNOSIS — F4010 Social phobia, unspecified: Secondary | ICD-10-CM | POA: Diagnosis not present

## 2018-07-16 DIAGNOSIS — F3340 Major depressive disorder, recurrent, in remission, unspecified: Secondary | ICD-10-CM | POA: Diagnosis not present

## 2018-07-17 DIAGNOSIS — M47896 Other spondylosis, lumbar region: Secondary | ICD-10-CM | POA: Diagnosis not present

## 2018-07-17 DIAGNOSIS — M542 Cervicalgia: Secondary | ICD-10-CM | POA: Diagnosis not present

## 2018-07-17 DIAGNOSIS — M545 Low back pain: Secondary | ICD-10-CM | POA: Diagnosis not present

## 2018-07-20 DIAGNOSIS — F4010 Social phobia, unspecified: Secondary | ICD-10-CM | POA: Diagnosis not present

## 2018-07-20 DIAGNOSIS — F411 Generalized anxiety disorder: Secondary | ICD-10-CM | POA: Diagnosis not present

## 2018-07-20 DIAGNOSIS — F3340 Major depressive disorder, recurrent, in remission, unspecified: Secondary | ICD-10-CM | POA: Diagnosis not present

## 2018-07-23 ENCOUNTER — Other Ambulatory Visit (HOSPITAL_COMMUNITY): Payer: BLUE CROSS/BLUE SHIELD | Attending: Psychiatry | Admitting: Licensed Clinical Social Worker

## 2018-07-23 DIAGNOSIS — F17210 Nicotine dependence, cigarettes, uncomplicated: Secondary | ICD-10-CM | POA: Insufficient documentation

## 2018-07-23 DIAGNOSIS — M797 Fibromyalgia: Secondary | ICD-10-CM | POA: Diagnosis not present

## 2018-07-23 DIAGNOSIS — J45909 Unspecified asthma, uncomplicated: Secondary | ICD-10-CM | POA: Diagnosis not present

## 2018-07-23 DIAGNOSIS — F4310 Post-traumatic stress disorder, unspecified: Secondary | ICD-10-CM | POA: Insufficient documentation

## 2018-07-23 DIAGNOSIS — G4700 Insomnia, unspecified: Secondary | ICD-10-CM | POA: Diagnosis not present

## 2018-07-23 DIAGNOSIS — Z79899 Other long term (current) drug therapy: Secondary | ICD-10-CM | POA: Diagnosis not present

## 2018-07-23 DIAGNOSIS — F419 Anxiety disorder, unspecified: Secondary | ICD-10-CM | POA: Insufficient documentation

## 2018-07-23 DIAGNOSIS — K219 Gastro-esophageal reflux disease without esophagitis: Secondary | ICD-10-CM | POA: Diagnosis not present

## 2018-07-23 DIAGNOSIS — B1920 Unspecified viral hepatitis C without hepatic coma: Secondary | ICD-10-CM | POA: Diagnosis not present

## 2018-07-23 DIAGNOSIS — Z8249 Family history of ischemic heart disease and other diseases of the circulatory system: Secondary | ICD-10-CM | POA: Diagnosis not present

## 2018-07-23 DIAGNOSIS — E785 Hyperlipidemia, unspecified: Secondary | ICD-10-CM | POA: Insufficient documentation

## 2018-07-23 DIAGNOSIS — F333 Major depressive disorder, recurrent, severe with psychotic symptoms: Secondary | ICD-10-CM

## 2018-07-23 NOTE — Progress Notes (Signed)
    Daily Group Progress Note  Program: IOP  Group Time:   Participation Level: Active  Behavioral Response: Appropriate  Type of Therapy:  Group Therapy; psychoeducational group, process group  Summary of Progress:  The purpose of this group is to utilize CBT and DBT skills in a group setting to increase utilization of healthy coping skills and decrease intensity of active mental health symtpoms  9am -11am Clinician introduced new members to group and inquired about symptoms and goals. Clinician assessed for SI/HI/psychosis and overall level of functioning.  Clinician presented activity of Dual Self Portrait focusing on the topic of recognizing internal and external symptoms. Clinician and group members processed differences in how we present ourselves to others vs how we feel about selves internally. Clinician praised group members for sharing portraits stories. Clinician reminded client's of the DBT skill 'Half Smile'  Clinician and group members practiced utilizing 'I-statements' and ways to help take a step back from a conversation to identify thoughts and feelings. Clinician offered the option of putting a time frame on when to return to the conversations. Clinician and group members processed differences in using 'I-statements' vs blaming statements and how that can affect conversations and emotions during conversations. Clinician validated feelings of frustration when others do not respect boundaries of skills.  11am-12pm Yoga and meditation series lead by co-facilitator. Clinician presented yoga for mindfulness meditation to slowdown and be present in the moment as a relaxation technique.  Client participated in all group activities an discussions. Client was initially resistant to dual self portrait activity reporting she did not like the one she did last time and states she does not like to see herself that way. Client did complete activity and reports she felt better after ripping the  drawing up and throwing it in the trash. Client notes increased anxiety and depressive symptoms related to several recent losses and would like to focus on skills to ease anxiety symptoms as well as depressive sx.   Olegario Messier, LCSW
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
## 2018-07-24 ENCOUNTER — Other Ambulatory Visit (HOSPITAL_COMMUNITY): Payer: BLUE CROSS/BLUE SHIELD | Admitting: Licensed Clinical Social Worker

## 2018-07-24 DIAGNOSIS — Z79899 Other long term (current) drug therapy: Secondary | ICD-10-CM | POA: Diagnosis not present

## 2018-07-24 DIAGNOSIS — B1920 Unspecified viral hepatitis C without hepatic coma: Secondary | ICD-10-CM | POA: Diagnosis not present

## 2018-07-24 DIAGNOSIS — F419 Anxiety disorder, unspecified: Secondary | ICD-10-CM | POA: Diagnosis not present

## 2018-07-24 DIAGNOSIS — F17210 Nicotine dependence, cigarettes, uncomplicated: Secondary | ICD-10-CM | POA: Diagnosis not present

## 2018-07-24 DIAGNOSIS — J45909 Unspecified asthma, uncomplicated: Secondary | ICD-10-CM | POA: Diagnosis not present

## 2018-07-24 DIAGNOSIS — E785 Hyperlipidemia, unspecified: Secondary | ICD-10-CM | POA: Diagnosis not present

## 2018-07-24 DIAGNOSIS — F333 Major depressive disorder, recurrent, severe with psychotic symptoms: Secondary | ICD-10-CM | POA: Diagnosis not present

## 2018-07-24 DIAGNOSIS — K219 Gastro-esophageal reflux disease without esophagitis: Secondary | ICD-10-CM | POA: Diagnosis not present

## 2018-07-24 DIAGNOSIS — G4700 Insomnia, unspecified: Secondary | ICD-10-CM | POA: Diagnosis not present

## 2018-07-24 DIAGNOSIS — F4310 Post-traumatic stress disorder, unspecified: Secondary | ICD-10-CM | POA: Diagnosis not present

## 2018-07-24 DIAGNOSIS — M797 Fibromyalgia: Secondary | ICD-10-CM | POA: Diagnosis not present

## 2018-07-24 DIAGNOSIS — Z8249 Family history of ischemic heart disease and other diseases of the circulatory system: Secondary | ICD-10-CM | POA: Diagnosis not present
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
## 2018-07-27 ENCOUNTER — Other Ambulatory Visit (HOSPITAL_COMMUNITY): Payer: BLUE CROSS/BLUE SHIELD | Admitting: Psychiatry

## 2018-07-27 DIAGNOSIS — F333 Major depressive disorder, recurrent, severe with psychotic symptoms: Secondary | ICD-10-CM

## 2018-07-27 DIAGNOSIS — M797 Fibromyalgia: Secondary | ICD-10-CM | POA: Diagnosis not present

## 2018-07-27 DIAGNOSIS — G4700 Insomnia, unspecified: Secondary | ICD-10-CM | POA: Diagnosis not present

## 2018-07-27 DIAGNOSIS — F419 Anxiety disorder, unspecified: Secondary | ICD-10-CM | POA: Diagnosis not present

## 2018-07-27 DIAGNOSIS — Z79899 Other long term (current) drug therapy: Secondary | ICD-10-CM | POA: Diagnosis not present

## 2018-07-27 DIAGNOSIS — E785 Hyperlipidemia, unspecified: Secondary | ICD-10-CM | POA: Diagnosis not present

## 2018-07-27 DIAGNOSIS — J45909 Unspecified asthma, uncomplicated: Secondary | ICD-10-CM | POA: Diagnosis not present

## 2018-07-27 DIAGNOSIS — Z8249 Family history of ischemic heart disease and other diseases of the circulatory system: Secondary | ICD-10-CM | POA: Diagnosis not present

## 2018-07-27 DIAGNOSIS — B1920 Unspecified viral hepatitis C without hepatic coma: Secondary | ICD-10-CM | POA: Diagnosis not present

## 2018-07-27 DIAGNOSIS — F4310 Post-traumatic stress disorder, unspecified: Secondary | ICD-10-CM | POA: Diagnosis not present

## 2018-07-27 DIAGNOSIS — K219 Gastro-esophageal reflux disease without esophagitis: Secondary | ICD-10-CM | POA: Diagnosis not present

## 2018-07-27 DIAGNOSIS — F17210 Nicotine dependence, cigarettes, uncomplicated: Secondary | ICD-10-CM | POA: Diagnosis not present
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
## 2018-07-27 NOTE — Progress Notes (Signed)
    Daily Group Progress Note  Program: IOP  Group Time: 9am-12pm  Participation Level: Active  Behavioral Response: Appropriate  Type of Therapy:  Group Therapy; psycho-educational group, process group  Summary of Progress:  Clinician checked in with group members, assessing for SI/HI/psychosis, overall level of functioning.  9-10:30am Clinician presented topic of ETF/Tapping. Clinician provided psychoeducation on stress relief technique. Clinician and group members discussed the importance of positive self talk to interrupt rumination of unhelpful thoughts. Clinician reminded clients of the cognitive triangle and connection of thoughts, emotions, and behaviors.  10:30-12pm Clinician and group members discussed what a 'Well Balanced' Life looks like. Clinician and group members engaged in [place] activity focusing on self-esteem. Clinician and group members processed the importance of self awareness in order to identify thoughts and use coping skills to create more helpful thoughts.  Client presents and participated in group discussions and activities. Client verbalized she did not feel comfortable with the activity from the previous day and thought about some of her comments all evening. Client was open to the idea of creating alternative thoughts to change intensity of feelings however remained concrete in thoughts related to her paranoia and people breaking into her home and watching her. Client was unable to identify the posibility of an alternative thought and had difficulty processing this concept. Throughout group client was easily distractible and often tangential and difficult to re-direct. Clinician will consult with treatment team for options of different level of care.  Olegario Messier, LCSW

## 2018-07-27 NOTE — Progress Notes (Signed)
    Daily Group Progress Note  Program: IOP  Group Time: 9am-12pm  Participation Level: Active  Behavioral Response: Sharing, Disruptive and Suspicious  Type of Therapy:  Group Therapy  Summary of Progress:  Clinician checked in with clients, assessing for SI/HI/psychosis, overall level of functioning.  9am-10:30am Pharmacist. Clients allowed time to ask questions related to medications and any possible side effects. Pharmacist helped identify which symptoms can be helped with medications and which symptoms would benefit being addressed with therapy skills.  10:30am-12pm Clinician presented the topic of vulnerability. Clinician provided clients with Who Are You activity focused on consciously acknowledging and accepting core self, becoming more mindful and self-aware. Clinician presented video on the role vulnerability plays in therapy and daily relationships. Clinician facilitated discussion on the pros and cons of vulnerability and being open to uncomfortable feelings and situations. Clinician requested client's take Who Are You activity home and re-complete it with a focus on being vulnerable and what they are willing for that to look like. Clinician participated in discussion with pharmacist however required re-direction multiple times. Client continues to struggle with processing alternative thoughts and remains concrete in answers. Client met with discharge team of case manager and nurse practitioner to discuss alternative service options to meet clients current needs. See note below for further details.  Olegario Messier, LCSW

## 2018-07-28 ENCOUNTER — Other Ambulatory Visit (HOSPITAL_COMMUNITY): Payer: BLUE CROSS/BLUE SHIELD

## 2018-07-28 DIAGNOSIS — F4010 Social phobia, unspecified: Secondary | ICD-10-CM | POA: Diagnosis not present

## 2018-07-28 DIAGNOSIS — F411 Generalized anxiety disorder: Secondary | ICD-10-CM | POA: Diagnosis not present

## 2018-07-28 DIAGNOSIS — F3340 Major depressive disorder, recurrent, in remission, unspecified: Secondary | ICD-10-CM | POA: Diagnosis not present

## 2018-07-29 ENCOUNTER — Other Ambulatory Visit (HOSPITAL_COMMUNITY): Payer: BLUE CROSS/BLUE SHIELD

## 2018-07-30 ENCOUNTER — Other Ambulatory Visit (HOSPITAL_COMMUNITY): Payer: BLUE CROSS/BLUE SHIELD

## 2018-07-31 ENCOUNTER — Other Ambulatory Visit (HOSPITAL_COMMUNITY): Payer: BLUE CROSS/BLUE SHIELD

## 2018-08-03 ENCOUNTER — Other Ambulatory Visit (HOSPITAL_COMMUNITY): Payer: BLUE CROSS/BLUE SHIELD

## 2018-08-04 ENCOUNTER — Other Ambulatory Visit (HOSPITAL_COMMUNITY): Payer: BLUE CROSS/BLUE SHIELD

## 2018-08-04 ENCOUNTER — Encounter: Payer: Self-pay | Admitting: Cardiology

## 2018-08-04 ENCOUNTER — Ambulatory Visit: Payer: BLUE CROSS/BLUE SHIELD | Admitting: Cardiology

## 2018-08-04 VITALS — BP 119/74 | HR 70 | Ht 64.0 in | Wt 132.2 lb

## 2018-08-04 DIAGNOSIS — F411 Generalized anxiety disorder: Secondary | ICD-10-CM | POA: Diagnosis not present

## 2018-08-04 DIAGNOSIS — R079 Chest pain, unspecified: Secondary | ICD-10-CM

## 2018-08-04 DIAGNOSIS — R42 Dizziness and giddiness: Secondary | ICD-10-CM

## 2018-08-04 DIAGNOSIS — I158 Other secondary hypertension: Secondary | ICD-10-CM

## 2018-08-04 DIAGNOSIS — F333 Major depressive disorder, recurrent, severe with psychotic symptoms: Secondary | ICD-10-CM | POA: Diagnosis not present

## 2018-08-04 DIAGNOSIS — T50905A Adverse effect of unspecified drugs, medicaments and biological substances, initial encounter: Secondary | ICD-10-CM

## 2018-08-04 MED ORDER — ASPIRIN EC 81 MG PO TBEC: 81.0000 mg | DELAYED_RELEASE_TABLET | Freq: Every day | ORAL | 3 refills | Status: DC

## 2018-08-04 MED ORDER — NITROGLYCERIN 0.4 MG SL SUBL: 0.4000 mg | SUBLINGUAL_TABLET | SUBLINGUAL | 6 refills | Status: DC | PRN

## 2018-08-04 NOTE — Progress Notes (Signed)
PCP: Patient, No Pcp Per  OB/Gyn: Dian Queen, MD   Clinic Note: Chief Complaint  Patient presents with  . New Patient (Initial Visit)    chest pain , dizzy, high BP with change in medications.    HPI: Sarah Reeves is a 57 y.o. female with a PMH notable for hypertension and pretty significant depression with anxiety previously evaluated for chest pain back in 2016 with negative Myoview.  She is now being referred back for the evaluation of dizzy spells, chest pain and change in blood pressure after being in the hospital for psychiatric issues.   She is being seen today at the request of Dian Queen, MD.  Sarah Reeves was last seen back on Jul 24, 2015 for CP evaluation - Myoview was negative for ischemia.  Plan was for her to follow-up in 6 months if she had more pain, we did recommend treating her musculoskeletal chest pain with NSAIDs or Tylenol.  Recent Hospitalizations:   Admitted for MDD / GAD (PTSD) relapse ** -- hospital records reviewed (not reported due to privacy concerns) -- was d/c'd on several new medications --these medicines included Geodon and Cymbalta.   Studies Personally Reviewed - (if available, images/films reviewed: From Epic Chart or Care Everywhere)  None since Myoview in 2016.  Interval History: He is here today after asking Dr. Tressia Danas to refer her because of recurrent episodes of chest pain that is pretty much similar to what she had before that come and go mostly at rest upper chest tightness sensation.  These occur when she has something straighten her or make her scared.  A loud noise can do it.  It is not at all associated with any exertion.  In fact walking makes it better if she can get up and start breathing more. She also described that when she first started her medicines from discharge which included Cymbalta and Geodon, her blood pressure went up quite a bit and she felt very hyperactive and and had a fainting spell this past weekend.   No further syncope since then but just some dizzy spells.  She has had some palpitations as well.  Over the weekend things were stabilized and she has not really had that much the way of any more dizzy spells.  Her blood pressure is also normalized out.  No TIA or amaurosis fugax symptoms.  No exertional chest pain no dyspnea.  No PND orthopnea or edema. No claudication.  ROS: A comprehensive was performed. Review of Systems  Constitutional: Positive for malaise/fatigue. Negative for weight loss (Actually weight gain with new meds.).  Cardiovascular: Positive for palpitations.  Musculoskeletal: Positive for falls.  Neurological: Positive for dizziness. Negative for loss of consciousness (She reports a possible pass out spell, but husband does not confirm.).  Psychiatric/Behavioral: Positive for depression. Negative for hallucinations (Per last hospitalization). The patient is nervous/anxious.    I have reviewed and (if needed) personally updated the patient's problem list, medications, allergies, past medical and surgical history, social and family history.   Past Medical History:  Diagnosis Date  . Anxiety   . Asthma   . Complication of anesthesia    takes alot to sedated  . Depression   . Fibromyalgia   . GERD (gastroesophageal reflux disease)   . Hepatitis C    (Dr. Earlean Shawl) Treated with Harvoni March-May 2016  . Herpes simplex   . Hyperlipemia   . Insomnia   . Mental disorder   . Neuropathy   .  PONV (postoperative nausea and vomiting)     Past Surgical History:  Procedure Laterality Date  . BREAST ENHANCEMENT SURGERY    . BREAST LUMPECTOMY WITH RADIOACTIVE SEED LOCALIZATION Left 12/26/2014   Procedure: LEFT BREAST LUMPECTOMY WITH RADIOACTIVE SEED LOCALIZATION;  Surgeon: Autumn Messing III, MD;  Location: [facility];  Service: General;  Laterality: Left;  . BREAST REDUCTION SURGERY    . COLONOSCOPY    . EXPLORATORY LAPAROTOMY    . NM MYOVIEW LTD  07/07/15    Normal Myocardial Perfusion Scan. Low risk lexiscan nuclear study with minimal insignificant breast attenuation and normal myocardial perfusion and function; EF 53% without wall motion abnormalities and normal systolic thickening  . TEMPOROMANDIBULAR JOINT SURGERY    . TUBAL LIGATION    . UPPER GI ENDOSCOPY     x5    Current Meds  Medication Sig  . Ascorbic Acid (VITAMIN C) 1000 MG tablet Take 1,000 mg by mouth daily.  . cetirizine (ZYRTEC) 10 MG tablet Take 1 tablet (10 mg total) by mouth daily.  . cholecalciferol (VITAMIN D) 1000 UNITS tablet Take 2,000 Units by mouth daily.   . cyclobenzaprine (FLEXERIL) 10 MG tablet Take 10 mg by mouth 3 (three) times daily as needed for muscle spasms.  . DULoxetine 40 MG CPEP Take 40 mg by mouth daily.  Marland Kitchen estradiol (VIVELLE-DOT) 0.075 MG/24HR Place 1 patch onto the skin 2 (two) times a week.  . gabapentin (NEURONTIN) 100 MG capsule Take 2 capsules (200 mg total) by mouth 3 (three) times daily.  Marland Kitchen HYDROcodone-acetaminophen (NORCO) 10-325 MG tablet TK 1 T PO Q 8 H PRN FOR PAIN  . progesterone (ENDOMETRIN) 100 MG vaginal insert Place 100 mg vaginally at bedtime.  . ziprasidone (GEODON) 40 MG capsule Take 1 capsule (40 mg total) by mouth every morning.  . ziprasidone (GEODON) 80 MG capsule Take 1 capsule (80 mg total) by mouth at bedtime.  Marland Kitchen zolpidem (AMBIEN) 5 MG tablet Take 5 mg by mouth as needed.    Allergies  Allergen Reactions  . Oysters [Shellfish Allergy] Other (See Comments)    Unknown reaction; "MD told me I was allergic"  . Zoloft [Sertraline Hcl] Other (See Comments)    REACTION: Nightmares, Grind teeth really bad  . Latex Itching    Itch and blisters from contact  . Amitriptyline     Hot, itching, tongue tingling, breakthrough bleeding.  . Clindamycin/Lincomycin Hives and Other (See Comments)    Stated that this causes her to get really horse and made her feel really hot  . Topamax [Topiramate]     Says it makes her "constipation and  she can't urinate normally"  . Uribel [Urelle] Swelling and Other (See Comments)    Patient stated this makes her stomach swell and makes her feel si    Social History   Tobacco Use  . Smoking status: Current Every Day Smoker    Packs/day: 0.50    Types: Cigarettes  . Smokeless tobacco: Never Used  Substance Use Topics  . Alcohol use: No  . Drug use: No    Comment: did have hx drug abuse as teen   Social History   Social History Narrative   Patient is right handed.   Four cups caffeine per day.   Lives at home with husband.    family history includes Alzheimer's disease in her maternal aunt and maternal grandmother; Breast cancer in her mother; Colon cancer in her father; Diabetes in her mother; Heart disease in  her father and mother; Hypothyroidism in her brother; Lung disease in her mother; Skin cancer in her maternal aunt.  Wt Readings from Last 3 Encounters:  08/04/18 132 lb 3.2 oz (60 kg)  06/26/18 127 lb (57.6 kg)  01/28/18 124 lb (56.2 kg)    PHYSICAL EXAM BP 119/74   Pulse 70   Ht 5\' 4"  (1.626 m)   Wt 132 lb 3.2 oz (60 kg)   BMI 22.69 kg/m  Physical Exam  Constitutional: She is oriented to person, place, and time. She appears well-developed and well-nourished. No distress (She is very uptight and anxious appearing.  Is quite sure that her chest pain is not cardiac, but decided to come to this visit anyway despite feeling better.).  HENT:  Head: Normocephalic and atraumatic.  Neck: Normal range of motion. Neck supple. No hepatojugular reflux and no JVD present. Carotid bruit is not present. No thyromegaly present.  Cardiovascular: Normal rate, regular rhythm, normal heart sounds and intact distal pulses.  No extrasystoles are present. PMI is not displaced. Exam reveals no gallop and no friction rub.  No murmur heard. Pulmonary/Chest: Effort normal and breath sounds normal. No respiratory distress. She has no wheezes. She has no rales.  Abdominal: Soft. Bowel  sounds are normal. She exhibits no distension. There is no rebound.  Musculoskeletal: Normal range of motion. She exhibits no edema.  Neurological: She is alert and oriented to person, place, and time. No cranial nerve deficit.  Skin: Skin is warm and dry.  Psychiatric:  Still somewhat anxious and pressured mood.  A little bit labile to.  She goes from a little bit self-deprecating to followed by being very concerned.  Vitals reviewed.    Adult ECG Report Not checked  Other studies Reviewed: Additional studies/ records that were reviewed today include:  Recent Labs:  n/a   ASSESSMENT / PLAN: Problem List Items Addressed This Visit    Chest pain at rest    Similar features to the chest pain she had back in 2016 based on description.  Honestly negative Myoview back then would make me feel like there is no need to check another one now.  I do not think is costochondritis.  It still seems like is more related to panic and anxiety attacks.  Continue to try to use Tylenol or ibuprofen.  On the off chance that it could potentially be related to coronary spasm, she may benefit from sublingual nitroglycerin.  I will check back in a few months to see how she is doing.  If doing well, will refer back to PCP.      Dizziness of unknown cause    Again feeling episodes of dizziness.  Certainly she can have dizziness with taking Geodon and gabapentin.  I am not very familiar with these medicines and their side effects, but if there was some changes in blood pressure, it is quite possible.  Now she seems to be doing much better and the symptoms seem to be resolved.  Again from a cardiac standpoint I recommend that she stays active and healthy with hydration.  Does not really sound like palpitations I do not think a monitor would be helpful.  She can keep an eye on her blood pressures to see how they are doing.  But for now seems to be relatively stable.      Generalized anxiety disorder (Chronic)     Her chest pain spells are associated with some tingling scaring her or frightening her, related to stress.  There could be a component of coronary spasm, but probably more just related to her PTSD and other psychiatric disorders.      High blood pressure due to drug - Primary    Geodon is potentially associated with adverse reaction of hypertension and other potential arrhythmia such as torsades and QT prolongation.  Her blood pressure today however looks pretty good. It could have just been her initiating the medication.  She will follow her blood pressures.  I would defer management of Geodon to her psychiatrist however.  There may be some interaction with Cymbalta (we need to check the pharmacy team --there is a potential side effect of serotonin syndrome -with possible interaction with Cymbalta.   Interestingly, the other interaction could be an additive effect of hypotension.  That could explain her dizziness.  These are not medications that I am familiar with titrating would therefore defer to psychiatry to reassess.      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 >  50% of the time was spent in direct patient consultation.   Current medicines are reviewed at length with the patient today.  (+/- concerns) n/a The following changes have been made:  see below  Patient Instructions  Medication Instructions:  USE NITROGLYCERIN  AND ( 3 ) ASPIRIN  81 MG AND  DRINK WATER  IF  CHEST PAIN OCCURS If you need a refill on your cardiac medications before your next appointment, please call your pharmacy.   Lab work: NOT NEEDED If you have labs (blood work) drawn today and your tests are completely normal, you will receive your results only by: Marland Kitchen MyChart Message (if you have MyChart)  OR . A paper copy in the mail If you have any lab test that is abnormal or we need to change your treatment, we will call you to review the results.  Testing/Procedures: NOT NEEDED  Follow-Up: At [facility], you and your health needs are our priority.  As part of our continuing mission to provide you with exceptional heart care, we have created designated Provider Care Teams.  These Care Teams include your primary Cardiologist (physician) and Advanced Practice Providers (APPs -  Physician Assistants and Nurse Practitioners) who all work together to provide you with the care you need, when you need it. . Your physician recommends that you schedule a follow-up appointment in [facility] .   Any Other Special Instructions Will Be Listed Below (If Applicable).      Studies Ordered:   No orders of the defined types were placed in this encounter.     Glenetta Hew, M.D., M.S. Interventional Cardiologist   Pager # [phone] Phone # [phone] [address] [place], [place] [address]   Thank you for choosing Heartcare at [facility]!!

## 2018-08-04 NOTE — Patient Instructions (Addendum)
Medication Instructions:  USE NITROGLYCERIN  AND ( 3 ) ASPIRIN  81 MG AND  DRINK WATER  IF  CHEST PAIN OCCURS If you need a refill on your cardiac medications before your next appointment, please call your pharmacy.   Lab work: NOT NEEDED If you have labs (blood work) drawn today and your tests are completely normal, you will receive your results only by: Marland Kitchen MyChart Message (if you have MyChart) OR . A paper copy in the mail If you have any lab test that is abnormal or we need to change your treatment, we will call you to review the results.  Testing/Procedures: NOT NEEDED  Follow-Up: At [facility], you and your health needs are our priority.  As part of our continuing mission to provide you with exceptional heart care, we have created designated Provider Care Teams.  These Care Teams include your primary Cardiologist (physician) and Advanced Practice Providers (APPs -  Physician Assistants and Nurse Practitioners) who all work together to provide you with the care you need, when you need it. . Your physician recommends that you schedule a follow-up appointment in [facility] .   Any Other Special Instructions Will Be Listed Below (If Applicable).

## 2018-08-05 ENCOUNTER — Other Ambulatory Visit (HOSPITAL_COMMUNITY): Payer: BLUE CROSS/BLUE SHIELD

## 2018-08-06 ENCOUNTER — Encounter: Payer: Self-pay | Admitting: Cardiology

## 2018-08-06 ENCOUNTER — Other Ambulatory Visit (HOSPITAL_COMMUNITY): Payer: BLUE CROSS/BLUE SHIELD

## 2018-08-06 DIAGNOSIS — I158 Other secondary hypertension: Secondary | ICD-10-CM | POA: Insufficient documentation

## 2018-08-06 DIAGNOSIS — T50905A Adverse effect of unspecified drugs, medicaments and biological substances, initial encounter: Principal | ICD-10-CM

## 2018-08-06 DIAGNOSIS — I10 Essential (primary) hypertension: Secondary | ICD-10-CM | POA: Insufficient documentation

## 2018-08-06 NOTE — Assessment & Plan Note (Signed)
Again feeling episodes of dizziness.  Certainly she can have dizziness with taking Geodon and gabapentin.  I am not very familiar with these medicines and their side effects, but if there was some changes in blood pressure, it is quite possible.  Now she seems to be doing much better and the symptoms seem to be resolved.  Again from a cardiac standpoint I recommend that she stays active and healthy with hydration.  Does not really sound like palpitations I do not think a monitor would be helpful.  She can keep an eye on her blood pressures to see how they are doing.  But for now seems to be relatively stable.

## 2018-08-06 NOTE — Assessment & Plan Note (Signed)
Similar features to the chest pain she had back in 2016 based on description.  Honestly negative Myoview back then would make me feel like there is no need to check another one now.  I do not think is costochondritis.  It still seems like is more related to panic and anxiety attacks.  Continue to try to use Tylenol or ibuprofen.  On the off chance that it could potentially be related to coronary spasm, she may benefit from sublingual nitroglycerin.  I will check back in a few months to see how she is doing.  If doing well, will refer back to PCP.

## 2018-08-06 NOTE — Assessment & Plan Note (Signed)
Her chest pain spells are associated with some tingling scaring her or frightening her, related to stress.  There could be a component of coronary spasm, but probably more just related to her PTSD and other psychiatric disorders.

## 2018-08-06 NOTE — Assessment & Plan Note (Signed)
Geodon is potentially associated with adverse reaction of hypertension and other potential arrhythmia such as torsades and QT prolongation.  Her blood pressure today however looks pretty good. It could have just been her initiating the medication.  She will follow her blood pressures.  I would defer management of Geodon to her psychiatrist however.  There may be some interaction with Cymbalta (we need to check the pharmacy team --there is a potential side effect of serotonin syndrome -with possible interaction with Cymbalta.   Interestingly, the other interaction could be an additive effect of hypotension.  That could explain her dizziness.  These are not medications that I am familiar with titrating would therefore defer to psychiatry to reassess.

## 2018-08-07 ENCOUNTER — Other Ambulatory Visit (HOSPITAL_COMMUNITY): Payer: BLUE CROSS/BLUE SHIELD

## 2018-08-07 DIAGNOSIS — F4010 Social phobia, unspecified: Secondary | ICD-10-CM | POA: Diagnosis not present

## 2018-08-07 DIAGNOSIS — F3340 Major depressive disorder, recurrent, in remission, unspecified: Secondary | ICD-10-CM | POA: Diagnosis not present

## 2018-08-07 DIAGNOSIS — F411 Generalized anxiety disorder: Secondary | ICD-10-CM | POA: Diagnosis not present

## 2018-08-10 ENCOUNTER — Other Ambulatory Visit (HOSPITAL_COMMUNITY): Payer: BLUE CROSS/BLUE SHIELD

## 2018-08-11 ENCOUNTER — Encounter: Payer: Self-pay | Admitting: Allergy and Immunology

## 2018-08-11 ENCOUNTER — Ambulatory Visit: Payer: BLUE CROSS/BLUE SHIELD | Admitting: Allergy and Immunology

## 2018-08-11 ENCOUNTER — Other Ambulatory Visit (HOSPITAL_COMMUNITY): Payer: BLUE CROSS/BLUE SHIELD

## 2018-08-11 VITALS — BP 110/72 | HR 86 | Temp 98.2°F | Resp 18 | Ht 65.0 in | Wt 133.0 lb

## 2018-08-11 DIAGNOSIS — G43909 Migraine, unspecified, not intractable, without status migrainosus: Secondary | ICD-10-CM

## 2018-08-11 DIAGNOSIS — F17210 Nicotine dependence, cigarettes, uncomplicated: Secondary | ICD-10-CM

## 2018-08-11 DIAGNOSIS — H8103 Meniere's disease, bilateral: Secondary | ICD-10-CM | POA: Diagnosis not present

## 2018-08-11 DIAGNOSIS — H9313 Tinnitus, bilateral: Secondary | ICD-10-CM | POA: Diagnosis not present

## 2018-08-11 DIAGNOSIS — J3089 Other allergic rhinitis: Secondary | ICD-10-CM | POA: Diagnosis not present

## 2018-08-11 DIAGNOSIS — K219 Gastro-esophageal reflux disease without esophagitis: Secondary | ICD-10-CM

## 2018-08-11 DIAGNOSIS — B999 Unspecified infectious disease: Secondary | ICD-10-CM

## 2018-08-11 DIAGNOSIS — H6983 Other specified disorders of Eustachian tube, bilateral: Secondary | ICD-10-CM

## 2018-08-11 DIAGNOSIS — Z91013 Allergy to seafood: Secondary | ICD-10-CM | POA: Diagnosis not present

## 2018-08-11 MED ORDER — EPINEPHRINE 0.3 MG/0.3ML IJ SOAJ: 0.3000 mg | Freq: Once | INTRAMUSCULAR | 2 refills | Status: AC

## 2018-08-11 MED ORDER — RANITIDINE HCL 300 MG PO TABS: 300.0000 mg | ORAL_TABLET | Freq: Every day | ORAL | 5 refills | Status: DC

## 2018-08-11 MED ORDER — MONTELUKAST SODIUM 10 MG PO TABS: 10.0000 mg | ORAL_TABLET | Freq: Every day | ORAL | 5 refills | Status: DC

## 2018-08-11 NOTE — Progress Notes (Signed)
Dear Dr. Helane Rima,  Thank you for referring Sarah Reeves to the [facility] on 08/11/2018.   Below is a summation of this patient's evaluation and recommendations.  Thank you for your referral. I will keep you informed about this patient's response to treatment.   If you have any questions please do not hesitate to contact me.   Sincerely,  Jiles Prows, MD Allergy / Immunology [facility]   ______________________________________________________________________    NEW PATIENT NOTE  Referring Provider: Dian Queen, MD Primary Provider: Dian Queen, MD Date of office visit: 08/11/2018    Subjective:   Chief Complaint:  Sarah Reeves (DOB: 10-14-1961) is a 57 y.o. female who presents to the clinic on 08/11/2018 with a chief complaint of Allergies .     HPI: Nathan presents to this clinic in evaluation of a multitude of different issues.  First, she does have issues with sneezing and nasal congestion and nose blowing without any anosmia of many years duration with occurrence on a perennial basis with fall-time exacerbation especially following exposure to the outdoors and grass and mulch and strong scents.  She has tried various treatments in the past and has not been particularly impressed with the response she has received from these forms of treatment.  Second, she has what sounds like chronic migraines and has seen a neurologist for this issue.  Triggers include noise and smells and lights.  It does not sound as though she has had any imaging study of her brain for this issue.  She has tried various preventative agents for her migraines but unfortunately developed side effects when utilizing these agents.  Third, she appears to have chronic tinnitus for the past 4 years.  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 Prescott Parma a long time ago with intermittent  vertigo that has not required any therapy.  But this is a different form of tinnitus and it appears to be a pretty consistent issue.  She has seen several ENT doctors since Dr. Prescott Parma over the course of the past 4 years and it does not a few appear that there is any specific therapy that can help this issue.  Apparently this did start after an acute event where she had some walking disability and was falling to the right but that issue appeared to resolve completely within several hours only to leave her with this persistent tinnitus.  Fourth, she has had several different types of infections over the course of the past several years.  She has recurrent urinary tract infections.  She has recurrent sinus infections.  Her sinus infections are manifested as very significant congestion of her head and occasional ugly nasal discharge.  She has had four these episodes in 2019 that have required antibiotics.  Fifth, she states that she has recurrent shingles affecting the right side of her face.  She has had 5 episodes in 2019 that she treats with 2 weeks of Valtrex and Neurontin per episode.  She had her initial event sometime prior to 2013. She has received her initial shingles vaccine in 2013. Her shingles presents mostly as pain and tingling and severe headache without any blisters or cutaneous abnormality.    Sixth, she has heartburn for which she will use over-the-counter medications.  She also also a history of chest pain that appears to be a crushing sensation phenomenon when it does occur.  Seventh, she appears to have some type of neuropathy affecting her lower extremities.  She has documented left foot neuropathy but as well she describes this tingling wormy sensation in the back of her legs that appears to be in a intermittent issue.  Once again she has seen a neurologist for this issue and has apparently had some imaging of her spine and neck which did demonstrate degenerative disc disease.  She has  been told that she has fibromyalgia.  Eighth, she does have a history of having an oyster allergy.  Apparently she has no history of developing a reaction to oyster that she can remember but she was told as a young girl that she was allergic to oysters.  Past Medical History:  Diagnosis Date  . Angio-edema   . Anxiety   . Asthma   . Complication of anesthesia    takes alot to sedated  . Depression   . Fibromyalgia   . GERD (gastroesophageal reflux disease)   . Hepatitis C    (Dr. Earlean Shawl) Treated with Harvoni March-May 2016  . Herpes simplex   . Hyperlipemia   . Insomnia   . Mental disorder   . Neuropathy   . PONV (postoperative nausea and vomiting)   . Urticaria     Past Surgical History:  Procedure Laterality Date  . BREAST ENHANCEMENT SURGERY    . BREAST LUMPECTOMY WITH RADIOACTIVE SEED LOCALIZATION Left 12/26/2014   Procedure: LEFT BREAST LUMPECTOMY WITH RADIOACTIVE SEED LOCALIZATION;  Surgeon: Autumn Messing III, MD;  Location: [facility];  Service: General;  Laterality: Left;  . BREAST REDUCTION SURGERY    . COLONOSCOPY    . EXPLORATORY LAPAROTOMY    . NM MYOVIEW LTD  07/07/15   Normal Myocardial Perfusion Scan. Low risk lexiscan nuclear study with minimal insignificant breast attenuation and normal myocardial perfusion and function; EF 53% without wall motion abnormalities and normal systolic thickening  . TEMPOROMANDIBULAR JOINT SURGERY    . TUBAL LIGATION    . UPPER GI ENDOSCOPY     x5    Allergies as of 08/11/2018      Reactions   Oysters [shellfish Allergy] Other (See Comments)   Unknown reaction; "MD told me I was allergic"   Zoloft [sertraline Hcl] Other (See Comments)   REACTION: Nightmares, Grind teeth really bad   Latex Itching   Itch and blisters from contact   Amitriptyline    Hot, itching, tongue tingling, breakthrough bleeding.   Clindamycin/lincomycin Hives, Other (See Comments)   Stated that this causes her to get really horse and made  her feel really hot   Topamax [topiramate]    Says it makes her "constipation and she can't urinate normally"   Uribel [urelle] Swelling, Other (See Comments)   Patient stated this makes her stomach swell and makes her feel si      Medication List      aspirin EC 81 MG tablet Take 1 tablet (81 mg total) by mouth daily.   cetirizine 10 MG tablet Commonly known as:  ZYRTEC Take 1 tablet (10 mg total) by mouth daily.   cholecalciferol 1000 units tablet Commonly known as:  VITAMIN D Take 2,000 Units by mouth daily.   cyclobenzaprine 10 MG tablet Commonly known as:  FLEXERIL Take 10 mg by mouth 3 (three) times daily as needed for muscle spasms.   DULoxetine HCl 40 MG Cpep Take 40 mg by mouth daily.   estradiol 0.075 MG/24HR Commonly known as:  VIVELLE-DOT Place 1  patch onto the skin 2 (two) times a week.   gabapentin 100 MG capsule Commonly known as:  NEURONTIN Take 2 capsules (200 mg total) by mouth 3 (three) times daily.   HYDROcodone-acetaminophen 10-325 MG tablet Commonly known as:  NORCO TK 1 T PO Q 8 H PRN FOR PAIN   nitroGLYCERIN 0.4 MG SL tablet Commonly known as:  NITROSTAT Place 1 tablet (0.4 mg total) under the tongue every 5 (five) minutes as needed for chest pain.   progesterone 100 MG vaginal insert Commonly known as:  ENDOMETRIN Place 100 mg vaginally at bedtime.   vitamin C 1000 MG tablet Take 1,000 mg by mouth daily.   ziprasidone 80 MG capsule Commonly known as:  GEODON Take 1 capsule (80 mg total) by mouth at bedtime.   ziprasidone 40 MG capsule Commonly known as:  GEODON Take 1 capsule (40 mg total) by mouth every morning.   zolpidem 5 MG tablet Commonly known as:  AMBIEN Take 5 mg by mouth as needed.       Review of systems negative except as noted in HPI / PMHx or noted below:  Review of Systems  Constitutional: Negative.   HENT: Negative.   Eyes: Negative.   Respiratory: Negative.   Cardiovascular: Negative.     Gastrointestinal: Negative.   Genitourinary: Negative.   Musculoskeletal: Negative.   Skin: Negative.   Neurological: Negative.   Endo/Heme/Allergies: Negative.   Psychiatric/Behavioral: Negative.     Family History  Problem Relation Age of Onset  . Diabetes Mother   . Lung disease Mother   . Heart disease Mother   . Breast cancer Mother   . Heart disease Father   . Colon cancer Father   . Hypothyroidism Brother   . Alzheimer's disease Maternal Aunt   . Skin cancer Maternal Aunt   . Alzheimer's disease Maternal Grandmother     Social History   Socioeconomic History  . Marital status: Married    Spouse name: Not on file  . Number of children: 0  . Years of education: 63  . Highest education level: Not on file  Occupational History  . Occupation: Retired  Scientific laboratory technician  . Financial resource strain: Not on file  . Food insecurity:    Worry: Not on file    Inability: Not on file  . Transportation needs:    Medical: Not on file    Non-medical: Not on file  Tobacco Use  . Smoking status: Current Every Day Smoker    Packs/day: 0.50    Types: Cigarettes  . Smokeless tobacco: Never Used  Substance and Sexual Activity  . Alcohol use: No  . Drug use: No    Comment: did have hx drug abuse as teen  . Sexual activity: Yes    Birth control/protection: Surgical  Lifestyle  . Physical activity:    Days per week: Not on file    Minutes per session: Not on file  . Stress: Not on file  Relationships  . Social connections:    Talks on phone: Not on file    Gets together: Not on file    Attends religious service: Not on file    Active member of club or organization: Not on file    Attends meetings of clubs or organizations: Not on file    Relationship status: Not on file  . Intimate partner violence:    Fear of current or ex partner: Not on file    Emotionally abused: Not on file  Physically abused: Not on file    Forced sexual activity: Not on file  Other Topics  Concern  . Not on file  Social History Narrative   Patient is right handed.   Four cups caffeine per day.   Lives at home with husband.    Environmental and Social history  Lives in a house with a dry environment, no animals located inside the household, no carpet in the bedroom, no plastic on the bed, no plastic on the pillow, actively smoking tobacco products at a rate of 1 pack/day.  Objective:   Vitals:   08/11/18 1415  BP: 110/72  Pulse: 86  Resp: 18  Temp: 98.2 F (36.8 C)  SpO2: 98%   Height: 5\' 5"  (165.1 cm) Weight: 133 lb (60.3 kg)  Physical Exam  HENT:  Head: Normocephalic. Head is without right periorbital erythema and without left periorbital erythema.  Right Ear: Tympanic membrane, external ear and ear canal normal.  Left Ear: Tympanic membrane, external ear and ear canal normal.  Nose: Nose normal. No mucosal edema or rhinorrhea.  Mouth/Throat: Oropharynx is clear and moist and mucous membranes are normal. No oropharyngeal exudate.  Eyes: Pupils are equal, round, and reactive to light. Conjunctivae and lids are normal.  Neck: Trachea normal. No tracheal deviation present. No thyromegaly present.  Cardiovascular: Normal rate, regular rhythm, S1 normal, S2 normal and normal heart sounds.  No murmur heard. Pulmonary/Chest: Effort normal. No stridor. No respiratory distress. She has no wheezes. She has no rales. She exhibits no tenderness.  Abdominal: Soft. She exhibits no distension and no mass. There is no hepatosplenomegaly. There is no tenderness. There is no rebound and no guarding.  Musculoskeletal: She exhibits no edema or tenderness.  Lymphadenopathy:       Head (right side): No tonsillar adenopathy present.       Head (left side): No tonsillar adenopathy present.    She has no cervical adenopathy.    She has no axillary adenopathy.  Neurological: She is alert.  Skin: No rash noted. She is not diaphoretic. No erythema. No pallor. Nails show no clubbing.     Diagnostics: Allergy skin tests were performed.  She did not demonstrate any hypersensitivity against a screening panel of aeroallergens or foods.  Results of blood tests obtained 25 June 2018 identified normal hepatic and renal function with a creatinine of 0.62 mg/DL, WBC 8.7, hemoglobin 13.8, platelet 302.  Results of blood tests obtained 27 June 2018 identified TSH 1.058 IU/mL  Results of a sinus CT scan obtained 26 August 2012 identified the following:  Sphenoid sinuses are clear. Ethmoid air cells are clear. Mild bilateral frontal sinus mucosal thickening, mostly in the inferior sinuses and right frontal recess.  No bubbly opacity. Maxillary sinuses are clear. Chronic leftward nasal septal deviation and spurring is stable. Small cortical screws in the angles of the mandible bilaterally  Results of a MRI brain obtained 03 May 2015 identified the following:  The brain has a normal appearance on all pulse sequences without evidence of malformation, atrophy, old or acute infarction, mass lesion, hemorrhage, hydrocephalus or extra-axial collection. No pituitary mass. No fluid in the sinuses, middle ears or mastoids. Mild mucosal thickening in the right frontal and anterior ethmoid region. No skull or skullbase lesion. There is flow in the major vessels at the base of the brain. Major venous sinuses show flow. CP angle regions are normal. Seventh and eighth nerve complexes are normal. No vestibular schwannoma. After contrast administration, no abnormal enhancement  occurs.  Results of a carotid ultrasound obtained 21 April 2015 identified the following:  Minor carotid atherosclerosis. No hemodynamically significant ICA stenosis. Degree of narrowing less than 50% bilaterally.  Results of a myocardial perfusion stress test obtained 07 July 2015 identified the following:   Nuclear stress EF: 53%.  The left ventricular ejection fraction is mildly decreased  (45-54%).  There was no ST segment deviation noted during stress.  Normal myocardial perfusion and function.  Assessment and Plan:    1. Perennial allergic rhinitis   2. Dysfunction of both eustachian tubes   3. Meniere's disease of both ears   4. Tinnitus of both ears   5. Gastroesophageal reflux disease, esophagitis presence not specified   6. Recurrent infections   7. Heavy tobacco smoker >10 cigarettes per day   8. Shellfish allergy   9. Migraine syndrome     1.  Allergen avoidance measures.  Minimize smoke exposure as much as possible  2.  Treat and prevent inflammation:   A.  Montelukast 10 mg tablet 1 time per day  B.  OTC Nasacort 1 spray each nostril 1 time per day  3.  Treat and prevent reflux:   A.  Consolidate all forms of caffeine and chocolate consumption  B.  Omeprazole 20 mg tablet 1 time per day  4.  If needed:   A.  Nasal saline wash  B.  Cetirizine 10 mg tablet 1 time per day  C.  Auvi-Q 0.3, Benadryl, MD/ER evaluation for allergic reaction  5.  Blood - Area 2 profile, IgA/G/M, anti-pneumo 23 ab, anti-tetanus ab, sed, CRP, ANA w/reflex, TP, shellfish panel  6.  Further evaluation and treatment?  7.  Return to clinic in 3 weeks or earlier if problem  Xzandria has a lot going home and has a large collection of symptoms some of which may be atopic in nature.  We will further explore her complaints by obtaining the blood tests noted above looking for a an overactive and intact immune system as well as screening for various forms of immunological hyperreactivity.  She will utilize anti-inflammatory agents on a consistent basis for her upper airways as she does appear to have some very mild chronic sinusitis based on imaging studies performed in the past.  As well, she does appear to have reflux which I have addressed with the plan noted above.  I will regroup with her in 3 weeks to assess her response to this approach and of course I had a talk with her today  about the need to eliminate smoke exposure as this is no doubt giving rise to some degree of inflammation within her airway.  Jiles Prows, MD Allergy / Immunology [facility]

## 2018-08-11 NOTE — Patient Instructions (Addendum)
  1.  Allergen avoidance measures.  Minimize smoke exposure as much as possible  2.  Treat and prevent inflammation:   A.  Montelukast 10 mg tablet 1 time per day  B.  OTC Nasacort 1 spray each nostril 1 time per day  3.  Treat and prevent reflux:   A.  Consolidate all forms of caffeine and chocolate consumption  B.  Omeprazole 20 mg tablet 1 time per day  4.  If needed:   A.  Nasal saline wash  B.  Cetirizine 10 mg tablet 1 time per day  C.  Auvi-Q 0.3, Benadryl, MD/ER evaluation for allergic reaction  5.  Blood - Area 2 profile, IgA/G/M, anti-pneumo 23 ab, anti-tetanus ab, sed, CRP, ANA w/reflex, TP, shellfish panel  6.  Further evaluation and treatment?  7.  Return to clinic in 3 weeks or earlier if problem

## 2018-08-12 ENCOUNTER — Telehealth: Payer: Self-pay | Admitting: *Deleted

## 2018-08-12 ENCOUNTER — Other Ambulatory Visit (HOSPITAL_COMMUNITY): Payer: BLUE CROSS/BLUE SHIELD

## 2018-08-12 ENCOUNTER — Encounter: Payer: Self-pay | Admitting: Allergy and Immunology

## 2018-08-12 DIAGNOSIS — Z91013 Allergy to seafood: Secondary | ICD-10-CM

## 2018-08-12 DIAGNOSIS — F411 Generalized anxiety disorder: Secondary | ICD-10-CM | POA: Diagnosis not present

## 2018-08-12 DIAGNOSIS — J3089 Other allergic rhinitis: Secondary | ICD-10-CM

## 2018-08-12 DIAGNOSIS — B999 Unspecified infectious disease: Secondary | ICD-10-CM

## 2018-08-12 DIAGNOSIS — F4010 Social phobia, unspecified: Secondary | ICD-10-CM | POA: Diagnosis not present

## 2018-08-12 DIAGNOSIS — F3340 Major depressive disorder, recurrent, in remission, unspecified: Secondary | ICD-10-CM | POA: Diagnosis not present

## 2018-08-12 NOTE — Telephone Encounter (Signed)
Orders placed and patient will have drawn tomorrow.

## 2018-08-12 NOTE — Addendum Note (Signed)
Addended by: Lucrezia Starch I on: 08/12/2018 09:10 AM   Modules accepted: Orders

## 2018-08-12 NOTE — Telephone Encounter (Signed)
-----   Message from Jiles Prows, MD sent at 08/12/2018  7:52 AM EDT ----- Please inform patient that I did review her blood tests and scans and we do need to obtain a few more blood tests.  Please have her obtain the following:  Blood - Area 2 profile, IgA/G/M, anti-pneumo 23 ab, anti-tetanus ab, sed, CRP, ANA w/reflex, TP, shellfish panel. Use diagnoses noted during her last encounter.

## 2018-08-13 ENCOUNTER — Other Ambulatory Visit (HOSPITAL_COMMUNITY): Payer: BLUE CROSS/BLUE SHIELD

## 2018-08-13 DIAGNOSIS — B999 Unspecified infectious disease: Secondary | ICD-10-CM | POA: Diagnosis not present

## 2018-08-13 DIAGNOSIS — J3089 Other allergic rhinitis: Secondary | ICD-10-CM | POA: Diagnosis not present

## 2018-08-13 DIAGNOSIS — Z91013 Allergy to seafood: Secondary | ICD-10-CM | POA: Diagnosis not present

## 2018-08-13 NOTE — Addendum Note (Signed)
Addended by: Lucrezia Starch I on: 08/13/2018 07:11 AM   Modules accepted: Orders

## 2018-08-14 ENCOUNTER — Other Ambulatory Visit (HOSPITAL_COMMUNITY): Payer: BLUE CROSS/BLUE SHIELD

## 2018-08-17 ENCOUNTER — Other Ambulatory Visit (HOSPITAL_COMMUNITY): Payer: BLUE CROSS/BLUE SHIELD

## 2018-08-18 ENCOUNTER — Other Ambulatory Visit (HOSPITAL_COMMUNITY): Payer: BLUE CROSS/BLUE SHIELD

## 2018-08-18 DIAGNOSIS — N3010 Interstitial cystitis (chronic) without hematuria: Secondary | ICD-10-CM | POA: Diagnosis not present

## 2018-08-18 DIAGNOSIS — N9489 Other specified conditions associated with female genital organs and menstrual cycle: Secondary | ICD-10-CM | POA: Diagnosis not present

## 2018-08-18 DIAGNOSIS — M3500 Sicca syndrome, unspecified: Secondary | ICD-10-CM | POA: Diagnosis not present

## 2018-08-18 DIAGNOSIS — M797 Fibromyalgia: Secondary | ICD-10-CM | POA: Diagnosis not present

## 2018-08-18 DIAGNOSIS — R3989 Other symptoms and signs involving the genitourinary system: Secondary | ICD-10-CM | POA: Insufficient documentation

## 2018-08-19 ENCOUNTER — Other Ambulatory Visit (HOSPITAL_COMMUNITY): Payer: BLUE CROSS/BLUE SHIELD

## 2018-08-20 ENCOUNTER — Other Ambulatory Visit (HOSPITAL_COMMUNITY): Payer: BLUE CROSS/BLUE SHIELD

## 2018-08-20 LAB — STREP PNEUMONIAE 23 SEROTYPES IGG
Pneumo Ab Type 1*: 1.4 ug/mL (ref >1.3)
Pneumo Ab Type 12 (12F)*: 1.2 ug/mL — ABNORMAL LOW (ref >1.3)
Pneumo Ab Type 14*: 13 ug/mL (ref >1.3)
Pneumo Ab Type 17 (17F)*: 1.7 ug/mL (ref >1.3)
Pneumo Ab Type 19 (19F)*: 11.9 ug/mL (ref >1.3)
Pneumo Ab Type 2*: 5.7 ug/mL (ref >1.3)
Pneumo Ab Type 20*: 2.6 ug/mL (ref >1.3)
Pneumo Ab Type 22 (22F)*: 1.9 ug/mL (ref >1.3)
Pneumo Ab Type 23 (23F)*: 5.9 ug/mL (ref >1.3)
Pneumo Ab Type 26 (6B)*: 0.2 ug/mL — ABNORMAL LOW (ref >1.3)
Pneumo Ab Type 3*: 2.7 ug/mL (ref >1.3)
Pneumo Ab Type 34 (10A)*: 3.7 ug/mL (ref >1.3)
Pneumo Ab Type 4*: 1.4 ug/mL (ref >1.3)
Pneumo Ab Type 43 (11A)*: 4.3 ug/mL (ref >1.3)
Pneumo Ab Type 5*: 5.8 ug/mL (ref >1.3)
Pneumo Ab Type 51 (7F)*: 7.2 ug/mL (ref >1.3)
Pneumo Ab Type 54 (15B)*: 2.2 ug/mL (ref >1.3)
Pneumo Ab Type 56 (18C)*: 5.7 ug/mL (ref >1.3)
Pneumo Ab Type 57 (19A)*: 6.3 ug/mL (ref >1.3)
Pneumo Ab Type 68 (9V)*: 3.9 ug/mL (ref >1.3)
Pneumo Ab Type 70 (33F)*: 3.7 ug/mL (ref >1.3)
Pneumo Ab Type 8*: 7.3 ug/mL (ref >1.3)
Pneumo Ab Type 9 (9N)*: 5.5 ug/mL (ref >1.3)

## 2018-08-20 LAB — ALLERGENS W/TOTAL IGE AREA 2

## 2018-08-20 LAB — ALLERGEN PROFILE, SHELLFISH
Clam IgE: <0.1 kU/L
F023-IgE Crab: <0.1 kU/L
F080-IgE Lobster: <0.1 kU/L
F290-IgE Oyster: <0.1 kU/L
Scallop IgE: <0.1 kU/L
Shrimp IgE: <0.1 kU/L

## 2018-08-20 LAB — DIPHTHERIA / TETANUS ANTIBODY PANEL
Diphtheria Ab: 0.16 IU/mL (ref <0.10)
Tetanus Ab, IgG: 3.58 IU/mL (ref <0.10)

## 2018-08-20 LAB — ANA W/REFLEX: Anti Nuclear Antibody(ANA): NEGATIVE

## 2018-08-20 LAB — IGG, IGA, IGM
IgA/Immunoglobulin A, Serum: 114 mg/dL (ref 87–352)
IgG (Immunoglobin G), Serum: 781 mg/dL (ref 700–1600)
IgM (Immunoglobulin M), Srm: 57 mg/dL (ref 26–217)

## 2018-08-20 LAB — C-REACTIVE PROTEIN: CRP: <1 mg/L (ref 0–10)

## 2018-08-20 LAB — THYROID PEROXIDASE ANTIBODY: Thyroperoxidase Ab SerPl-aCnc: 11 IU/mL (ref 0–34)

## 2018-08-21 ENCOUNTER — Other Ambulatory Visit (HOSPITAL_COMMUNITY): Payer: BLUE CROSS/BLUE SHIELD

## 2018-08-24 ENCOUNTER — Other Ambulatory Visit (HOSPITAL_COMMUNITY): Payer: BLUE CROSS/BLUE SHIELD

## 2018-08-25 ENCOUNTER — Other Ambulatory Visit (HOSPITAL_COMMUNITY): Payer: BLUE CROSS/BLUE SHIELD

## 2018-08-25 DIAGNOSIS — F3340 Major depressive disorder, recurrent, in remission, unspecified: Secondary | ICD-10-CM | POA: Diagnosis not present

## 2018-08-25 DIAGNOSIS — F411 Generalized anxiety disorder: Secondary | ICD-10-CM | POA: Diagnosis not present

## 2018-08-25 DIAGNOSIS — F4010 Social phobia, unspecified: Secondary | ICD-10-CM | POA: Diagnosis not present

## 2018-08-26 ENCOUNTER — Other Ambulatory Visit (HOSPITAL_COMMUNITY): Payer: BLUE CROSS/BLUE SHIELD

## 2018-08-27 ENCOUNTER — Other Ambulatory Visit (HOSPITAL_COMMUNITY): Payer: BLUE CROSS/BLUE SHIELD

## 2018-08-28 ENCOUNTER — Other Ambulatory Visit (HOSPITAL_COMMUNITY): Payer: BLUE CROSS/BLUE SHIELD

## 2018-08-31 ENCOUNTER — Other Ambulatory Visit (HOSPITAL_COMMUNITY): Payer: BLUE CROSS/BLUE SHIELD

## 2018-08-31 DIAGNOSIS — F411 Generalized anxiety disorder: Secondary | ICD-10-CM | POA: Diagnosis not present

## 2018-08-31 DIAGNOSIS — F3340 Major depressive disorder, recurrent, in remission, unspecified: Secondary | ICD-10-CM | POA: Diagnosis not present

## 2018-08-31 DIAGNOSIS — F4010 Social phobia, unspecified: Secondary | ICD-10-CM | POA: Diagnosis not present

## 2018-09-01 ENCOUNTER — Other Ambulatory Visit (HOSPITAL_COMMUNITY): Payer: BLUE CROSS/BLUE SHIELD

## 2018-09-02 ENCOUNTER — Other Ambulatory Visit (HOSPITAL_COMMUNITY): Payer: BLUE CROSS/BLUE SHIELD

## 2018-09-03 ENCOUNTER — Other Ambulatory Visit (HOSPITAL_COMMUNITY): Payer: BLUE CROSS/BLUE SHIELD

## 2018-09-04 ENCOUNTER — Other Ambulatory Visit (HOSPITAL_COMMUNITY): Payer: BLUE CROSS/BLUE SHIELD

## 2018-09-07 ENCOUNTER — Other Ambulatory Visit (HOSPITAL_COMMUNITY): Payer: BLUE CROSS/BLUE SHIELD

## 2018-09-07 DIAGNOSIS — N3010 Interstitial cystitis (chronic) without hematuria: Secondary | ICD-10-CM | POA: Insufficient documentation

## 2018-09-07 DIAGNOSIS — R339 Retention of urine, unspecified: Secondary | ICD-10-CM | POA: Diagnosis not present

## 2018-09-07 DIAGNOSIS — F3340 Major depressive disorder, recurrent, in remission, unspecified: Secondary | ICD-10-CM | POA: Diagnosis not present

## 2018-09-07 DIAGNOSIS — F411 Generalized anxiety disorder: Secondary | ICD-10-CM | POA: Diagnosis not present

## 2018-09-07 DIAGNOSIS — F4010 Social phobia, unspecified: Secondary | ICD-10-CM | POA: Diagnosis not present

## 2018-09-08 ENCOUNTER — Other Ambulatory Visit (HOSPITAL_COMMUNITY): Payer: BLUE CROSS/BLUE SHIELD

## 2018-09-08 DIAGNOSIS — F3340 Major depressive disorder, recurrent, in remission, unspecified: Secondary | ICD-10-CM | POA: Diagnosis not present

## 2018-09-08 DIAGNOSIS — F4010 Social phobia, unspecified: Secondary | ICD-10-CM | POA: Diagnosis not present

## 2018-09-08 DIAGNOSIS — F411 Generalized anxiety disorder: Secondary | ICD-10-CM | POA: Diagnosis not present

## 2018-09-09 ENCOUNTER — Other Ambulatory Visit (HOSPITAL_COMMUNITY): Payer: BLUE CROSS/BLUE SHIELD

## 2018-09-10 ENCOUNTER — Other Ambulatory Visit (HOSPITAL_COMMUNITY): Payer: BLUE CROSS/BLUE SHIELD

## 2018-09-11 ENCOUNTER — Other Ambulatory Visit (HOSPITAL_COMMUNITY): Payer: BLUE CROSS/BLUE SHIELD

## 2018-09-14 ENCOUNTER — Other Ambulatory Visit (HOSPITAL_COMMUNITY): Payer: BLUE CROSS/BLUE SHIELD

## 2018-09-14 DIAGNOSIS — F3340 Major depressive disorder, recurrent, in remission, unspecified: Secondary | ICD-10-CM | POA: Diagnosis not present

## 2018-09-14 DIAGNOSIS — F411 Generalized anxiety disorder: Secondary | ICD-10-CM | POA: Diagnosis not present

## 2018-09-14 DIAGNOSIS — F4010 Social phobia, unspecified: Secondary | ICD-10-CM | POA: Diagnosis not present

## 2018-09-15 ENCOUNTER — Other Ambulatory Visit (HOSPITAL_COMMUNITY): Payer: BLUE CROSS/BLUE SHIELD

## 2018-09-15 ENCOUNTER — Ambulatory Visit: Payer: BLUE CROSS/BLUE SHIELD | Admitting: Allergy and Immunology

## 2018-09-16 ENCOUNTER — Other Ambulatory Visit (HOSPITAL_COMMUNITY): Payer: BLUE CROSS/BLUE SHIELD

## 2018-09-22 DIAGNOSIS — F4010 Social phobia, unspecified: Secondary | ICD-10-CM | POA: Diagnosis not present

## 2018-09-22 DIAGNOSIS — F3340 Major depressive disorder, recurrent, in remission, unspecified: Secondary | ICD-10-CM | POA: Diagnosis not present

## 2018-09-22 DIAGNOSIS — F411 Generalized anxiety disorder: Secondary | ICD-10-CM | POA: Diagnosis not present

## 2018-09-29 DIAGNOSIS — F411 Generalized anxiety disorder: Secondary | ICD-10-CM | POA: Diagnosis not present

## 2018-09-29 DIAGNOSIS — J209 Acute bronchitis, unspecified: Secondary | ICD-10-CM | POA: Diagnosis not present

## 2018-09-29 DIAGNOSIS — J40 Bronchitis, not specified as acute or chronic: Secondary | ICD-10-CM | POA: Diagnosis not present

## 2018-09-29 DIAGNOSIS — F3340 Major depressive disorder, recurrent, in remission, unspecified: Secondary | ICD-10-CM | POA: Diagnosis not present

## 2018-09-29 DIAGNOSIS — F4010 Social phobia, unspecified: Secondary | ICD-10-CM | POA: Diagnosis not present

## 2018-10-01 DIAGNOSIS — N3010 Interstitial cystitis (chronic) without hematuria: Secondary | ICD-10-CM | POA: Diagnosis not present

## 2018-10-01 DIAGNOSIS — R1110 Vomiting, unspecified: Secondary | ICD-10-CM | POA: Diagnosis not present

## 2018-10-01 DIAGNOSIS — N9489 Other specified conditions associated with female genital organs and menstrual cycle: Secondary | ICD-10-CM | POA: Diagnosis not present

## 2018-10-08 DIAGNOSIS — F3340 Major depressive disorder, recurrent, in remission, unspecified: Secondary | ICD-10-CM | POA: Diagnosis not present

## 2018-10-08 DIAGNOSIS — F411 Generalized anxiety disorder: Secondary | ICD-10-CM | POA: Diagnosis not present

## 2018-10-08 DIAGNOSIS — F4010 Social phobia, unspecified: Secondary | ICD-10-CM | POA: Diagnosis not present

## 2018-10-16 DIAGNOSIS — F4010 Social phobia, unspecified: Secondary | ICD-10-CM | POA: Diagnosis not present

## 2018-10-16 DIAGNOSIS — F411 Generalized anxiety disorder: Secondary | ICD-10-CM | POA: Diagnosis not present

## 2018-10-16 DIAGNOSIS — F3340 Major depressive disorder, recurrent, in remission, unspecified: Secondary | ICD-10-CM | POA: Diagnosis not present

## 2018-10-21 DIAGNOSIS — F411 Generalized anxiety disorder: Secondary | ICD-10-CM | POA: Diagnosis not present

## 2018-10-21 DIAGNOSIS — F4010 Social phobia, unspecified: Secondary | ICD-10-CM | POA: Diagnosis not present

## 2018-10-21 DIAGNOSIS — F3340 Major depressive disorder, recurrent, in remission, unspecified: Secondary | ICD-10-CM | POA: Diagnosis not present

## 2018-11-02 DIAGNOSIS — Z6823 Body mass index (BMI) 23.0-23.9, adult: Secondary | ICD-10-CM | POA: Diagnosis not present

## 2018-11-02 DIAGNOSIS — Z13820 Encounter for screening for osteoporosis: Secondary | ICD-10-CM | POA: Diagnosis not present

## 2018-11-02 DIAGNOSIS — Z01419 Encounter for gynecological examination (general) (routine) without abnormal findings: Secondary | ICD-10-CM | POA: Diagnosis not present

## 2018-11-02 DIAGNOSIS — Z1231 Encounter for screening mammogram for malignant neoplasm of breast: Secondary | ICD-10-CM | POA: Diagnosis not present

## 2018-11-04 ENCOUNTER — Ambulatory Visit: Payer: Self-pay | Admitting: Cardiology

## 2018-11-04 DIAGNOSIS — F4010 Social phobia, unspecified: Secondary | ICD-10-CM | POA: Diagnosis not present

## 2018-11-04 DIAGNOSIS — F411 Generalized anxiety disorder: Secondary | ICD-10-CM | POA: Diagnosis not present

## 2018-11-04 DIAGNOSIS — F3340 Major depressive disorder, recurrent, in remission, unspecified: Secondary | ICD-10-CM | POA: Diagnosis not present

## 2018-11-08 NOTE — Progress Notes (Signed)
Cardiology Office Note   Date:  11/09/2018   ID:  Sarah Reeves, Sarah Reeves 09/09/1961, MRN [id number]  PCP:  Dian Queen, MD  Cardiologist: Ellyn Hack Chief Complaint  Patient presents with  . Follow-up     History of Present Illness: Sarah Reeves is a 58 y.o. female who presents for ongoing assessment and management of HTN, chronic chest pain, dizziness, and significant psychiatric issues to include anxiety and depression. She has had a negative myoview in 2016. Chest pain and dizziness was felt to be related to anxiety. NTG was considered if the pain could be potentially related to coronary spasm. She was last seen in the office on 08/06/2018.   She comes today without cardiac complaints. She denies any new symptoms. She is seeing her psychiatrist and counselor regularly. The psychiatrist would like labs drawn as she is on Geodon to check liver enzymes.   Past Medical History:  Diagnosis Date  . Angio-edema   . Anxiety   . Asthma   . Complication of anesthesia    takes alot to sedated  . Depression   . Fibromyalgia   . GERD (gastroesophageal reflux disease)   . Hepatitis C    (Dr. Earlean Shawl) Treated with Harvoni March-May 2016  . Herpes simplex   . Hyperlipemia   . Insomnia   . Mental disorder   . Neuropathy   . PONV (postoperative nausea and vomiting)   . Urticaria     Past Surgical History:  Procedure Laterality Date  . BREAST ENHANCEMENT SURGERY    . BREAST LUMPECTOMY WITH RADIOACTIVE SEED LOCALIZATION Left 12/26/2014   Procedure: LEFT BREAST LUMPECTOMY WITH RADIOACTIVE SEED LOCALIZATION;  Surgeon: Autumn Messing III, MD;  Location: [place];  Service: General;  Laterality: Left;  . BREAST REDUCTION SURGERY    . COLONOSCOPY    . EXPLORATORY LAPAROTOMY    . NM MYOVIEW LTD  07/07/15   Normal Myocardial Perfusion Scan. Low risk lexiscan nuclear study with minimal insignificant breast attenuation and normal myocardial perfusion and function; EF 53% without wall  motion abnormalities and normal systolic thickening  . TEMPOROMANDIBULAR JOINT SURGERY    . TUBAL LIGATION    . UPPER GI ENDOSCOPY     x5     Current Outpatient Medications  Medication Sig Dispense Refill  . Ascorbic Acid (VITAMIN C) 1000 MG tablet Take 1,000 mg by mouth daily.    Marland Kitchen aspirin EC 81 MG tablet Take 1 tablet (81 mg total) by mouth daily. 90 tablet 3  . cetirizine (ZYRTEC) 10 MG tablet Take 1 tablet (10 mg total) by mouth daily.    . cholecalciferol (VITAMIN D) 1000 UNITS tablet Take 2,000 Units by mouth daily.     . cyclobenzaprine (FLEXERIL) 10 MG tablet Take 10 mg by mouth 3 (three) times daily as needed for muscle spasms.    . DULoxetine 40 MG CPEP Take 40 mg by mouth daily. 30 capsule 0  . estradiol (VIVELLE-DOT) 0.075 MG/24HR Place 1 patch onto the skin 2 (two) times a week.    . gabapentin (NEURONTIN) 100 MG capsule Take 2 capsules (200 mg total) by mouth 3 (three) times daily. 90 capsule 0  . HYDROcodone-acetaminophen (NORCO) 10-325 MG tablet TK 1 T PO Q 8 H PRN FOR PAIN  0  . montelukast (SINGULAIR) 10 MG tablet Take 1 tablet (10 mg total) by mouth at bedtime. 30 tablet 5  . nitroGLYCERIN (NITROSTAT) 0.4 MG SL tablet Place 1 tablet (0.4 mg total) under the  tongue every 5 (five) minutes as needed for chest pain. 25 tablet 6  . progesterone (ENDOMETRIN) 100 MG vaginal insert Place 100 mg vaginally at bedtime.    . ranitidine (ZANTAC) 300 MG tablet Take 1 tablet (300 mg total) by mouth at bedtime. 30 tablet 5  . ziprasidone (GEODON) 40 MG capsule Take 1 capsule (40 mg total) by mouth every morning. 30 capsule 0  . ziprasidone (GEODON) 80 MG capsule Take 1 capsule (80 mg total) by mouth at bedtime. 30 capsule 0  . zolpidem (AMBIEN) 5 MG tablet Take 5 mg by mouth as needed.  1   No current facility-administered medications for this visit.    Facility-Administered Medications Ordered in Other Visits  Medication Dose Route Frequency Provider Last Rate Last Dose  .  chlorhexidine (HIBICLENS) 4 % liquid 1 application  1 application Topical Once Autumn Messing III, MD      . chlorhexidine (HIBICLENS) 4 % liquid 1 application  1 application Topical Once Jovita Kussmaul, MD        Allergies:   Jeanie Cooks allergy]; Zoloft [sertraline hcl]; Latex; Amitriptyline; Clindamycin/lincomycin; Penicillins; Pentosan polysulfate sodium; Topamax [topiramate]; Uribel [urelle]; and Sulfamethoxazole-trimethoprim    Social History:  The patient  reports that she has been smoking cigarettes. She has been smoking about 0.50 packs per day. She has never used smokeless tobacco. She reports that she does not drink alcohol or use drugs.   Family History:  The patient's family history includes Alzheimer's disease in her maternal aunt and maternal grandmother; Breast cancer in her mother; Colon cancer in her father; Diabetes in her mother; Heart disease in her father and mother; Hypothyroidism in her brother; Lung disease in her mother; Skin cancer in her maternal aunt.    ROS: All other systems are reviewed and negative. Unless otherwise mentioned in H&P    PHYSICAL EXAM: VS:  BP 117/78   Pulse 95   Ht 5\' 4"  (1.626 m)   Wt 140 lb (63.5 kg)   SpO2 97%   BMI 24.03 kg/m  , BMI Body mass index is 24.03 kg/m. GEN: Well nourished, well developed, in no acute distress HEENT: normal Neck: no JVD, carotid bruits, or masses Cardiac: RRR; no murmurs, rubs, or gallops,no edema  Respiratory:  Clear to auscultation bilaterally, normal work of breathing GI: soft, nontender, nondistended, + BS MS: no deformity or atrophy Skin: warm and dry, no rash Neuro:  Strength and sensation are intact Psych: euthymic mood, full affect   EKG:  Not completed this office visit.   Recent Labs: 06/25/2018: ALT 11; BUN 8; Creatinine, Ser 0.62; Hemoglobin 13.8; Platelets 302; Potassium 4.1; Sodium 137 06/27/2018: TSH 1.058    Lipid Panel    Component Value Date/Time   CHOL 196 06/27/2018 0623    TRIG 114 06/27/2018 0623   HDL 59 06/27/2018 0623   CHOLHDL 3.3 06/27/2018 0623   VLDL 23 06/27/2018 0623   LDLCALC 114 (H) 06/27/2018 0623      Wt Readings from Last 3 Encounters:  11/09/18 140 lb (63.5 kg)  08/11/18 133 lb (60.3 kg)  08/04/18 132 lb 3.2 oz (60 kg)      Other studies Reviewed: NM Stress test 07-19-2015  Nuclear stress EF: 53%.  The left ventricular ejection fraction is mildly decreased (45-54%).  There was no ST segment deviation noted during stress.  Normal myocardial perfusion and function.   Low risk lexiscan nuclear study with minimal insignificant breast attenuation and normal myocardial perfusion and function;  EF 53% without wall motion abnormalities and normal systolic thickening.   ASSESSMENT AND PLAN:  1.  Hypertension: Well controlled today. She is not on any antihypertensive medications.   2. Chest pain: No recurrence of chest pain: No further cardiac testing unless she is symptomatic.   3. Anxiety and Depression: Followed by psychiatry. She requests labs on their behalf. I will have lipids/LFTs and BMET ordered.    Current medicines are reviewed at length with the patient today.    Labs/ tests ordered today include: Lipids. LFTs, and BMET.  Phill Myron. West Pugh, ANP, AACC   11/09/2018 5:36 PM    [facility] [address] Office [phone] Fax [phone]

## 2018-11-09 ENCOUNTER — Ambulatory Visit: Payer: BLUE CROSS/BLUE SHIELD | Admitting: Adult Health

## 2018-11-09 ENCOUNTER — Encounter: Payer: Self-pay | Admitting: Adult Health

## 2018-11-09 VITALS — BP 117/78 | HR 95 | Ht 64.0 in | Wt 140.0 lb

## 2018-11-09 DIAGNOSIS — I10 Essential (primary) hypertension: Secondary | ICD-10-CM

## 2018-11-09 DIAGNOSIS — Z79899 Other long term (current) drug therapy: Secondary | ICD-10-CM

## 2018-11-09 DIAGNOSIS — R079 Chest pain, unspecified: Secondary | ICD-10-CM | POA: Diagnosis not present

## 2018-11-09 DIAGNOSIS — F333 Major depressive disorder, recurrent, severe with psychotic symptoms: Secondary | ICD-10-CM

## 2018-11-09 NOTE — Patient Instructions (Addendum)
Follow-Up: You will need a follow up appointment in 12 months.  Please call our office 2 months (NOV 2020) in advance to schedule this (JAN 2021) appointment.  You may see Glenetta Hew, MD or one of the following Advanced Practice Providers on your designated Care Team:  Jory Sims, DNP, AACC  Rhonda Barrett, PA-C Jory Sims, DNP, ANP      Medication Instructions:  NO CHANGES- Your physician recommends that you continue on your current medications as directed. Please refer to the Current Medication list given to you today. If you need a refill on your cardiac medications before your next appointment, please call your pharmacy. Labwork: BMET, LFT AND LIPID-PLEASE RETURN FASTING When you have labs (blood work) and your tests are completely normal, you will receive your results ONLY by [place] (if you have MyChart) -OR- A paper copy in the mail.  At [facility], you and your health needs are our priority.  As part of our continuing mission to provide you with exceptional heart care, we have created designated Provider Care Teams.  These Care Teams include your primary Cardiologist (physician) and Advanced Practice Providers (APPs -  Physician Assistants and Nurse Practitioners) who all work together to provide you with the care you need, when you need it.  Thank you for choosing [facility] at [facility]!!

## 2018-11-10 ENCOUNTER — Ambulatory Visit: Payer: BLUE CROSS/BLUE SHIELD | Admitting: Allergy and Immunology

## 2018-11-10 DIAGNOSIS — Z79899 Other long term (current) drug therapy: Secondary | ICD-10-CM | POA: Diagnosis not present

## 2018-11-10 LAB — LIPID PANEL
Chol/HDL Ratio: 2.9 ratio (ref 0.0–4.4)
Cholesterol, Total: 223 mg/dL — ABNORMAL HIGH (ref 100–199)
HDL: 78 mg/dL (ref >39)
LDL Calculated: 120 mg/dL — ABNORMAL HIGH (ref 0–99)
Triglycerides: 125 mg/dL (ref 0–149)
VLDL Cholesterol Cal: 25 mg/dL (ref 5–40)

## 2018-11-10 LAB — HEPATIC FUNCTION PANEL
ALT: 12 IU/L (ref 0–32)
AST: 16 IU/L (ref 0–40)
Albumin: 4.7 g/dL (ref 3.8–4.9)
Alkaline Phosphatase: 42 IU/L (ref 39–117)
Bilirubin Total: 0.3 mg/dL (ref 0.0–1.2)
Bilirubin, Direct: 0.08 mg/dL (ref 0.00–0.40)
Total Protein: 6.7 g/dL (ref 6.0–8.5)

## 2018-11-10 LAB — BASIC METABOLIC PANEL
BUN/Creatinine Ratio: 12 (ref 9–23)
BUN: 9 mg/dL (ref 6–24)
CO2: 23 mmol/L (ref 20–29)
Calcium: 9.6 mg/dL (ref 8.7–10.2)
Chloride: 95 mmol/L — ABNORMAL LOW (ref 96–106)
Creatinine, Ser: 0.76 mg/dL (ref 0.57–1.00)
GFR calc Af Amer: 101 mL/min/{1.73_m2} (ref >59)
GFR calc non Af Amer: 88 mL/min/{1.73_m2} (ref >59)
Glucose: 83 mg/dL (ref 65–99)
Potassium: 4.8 mmol/L (ref 3.5–5.2)
Sodium: 133 mmol/L — ABNORMAL LOW (ref 134–144)

## 2018-11-17 ENCOUNTER — Telehealth: Payer: Self-pay | Admitting: *Deleted

## 2018-11-17 ENCOUNTER — Encounter: Payer: Self-pay | Admitting: *Deleted

## 2018-11-17 DIAGNOSIS — F3340 Major depressive disorder, recurrent, in remission, unspecified: Secondary | ICD-10-CM | POA: Diagnosis not present

## 2018-11-17 DIAGNOSIS — F4010 Social phobia, unspecified: Secondary | ICD-10-CM | POA: Diagnosis not present

## 2018-11-17 DIAGNOSIS — F411 Generalized anxiety disorder: Secondary | ICD-10-CM | POA: Diagnosis not present

## 2018-11-17 NOTE — Telephone Encounter (Signed)
The patient has been notified of the result and verbalized understanding.  All questions (if any) were answered. Raiford Simmonds, RN 11/17/2018 9:39 AM  Patient request a copy of labs  - so she can give to her psychiatrist  RN informed patient will mail letter and route information to primary  PATIENT VERBALIZED UNDERSTANDING

## 2018-11-17 NOTE — Telephone Encounter (Signed)
-----   Message from Leonie Man, MD sent at 11/12/2018 10:38 AM EST ----- Lab results: Chemistry looks pretty stable.  Sodium on the low end of normal but otherwise okay.  Cholesterol levels however are not well controlled with total cholesterol 223 and LDL of 120.  HDL is good at 78.  No real improvement from 4 months ago.  Would probably defer to PCP as far as managing lipids, but would anticipate the possibility of adding a medication such as atorvastatin or rosuvastatin.   Glenetta Hew, MD

## 2018-11-30 DIAGNOSIS — F3340 Major depressive disorder, recurrent, in remission, unspecified: Secondary | ICD-10-CM | POA: Diagnosis not present

## 2018-11-30 DIAGNOSIS — F411 Generalized anxiety disorder: Secondary | ICD-10-CM | POA: Diagnosis not present

## 2018-11-30 DIAGNOSIS — F4010 Social phobia, unspecified: Secondary | ICD-10-CM | POA: Diagnosis not present

## 2018-12-10 DIAGNOSIS — F3340 Major depressive disorder, recurrent, in remission, unspecified: Secondary | ICD-10-CM | POA: Diagnosis not present

## 2018-12-10 DIAGNOSIS — F4010 Social phobia, unspecified: Secondary | ICD-10-CM | POA: Diagnosis not present

## 2018-12-10 DIAGNOSIS — F411 Generalized anxiety disorder: Secondary | ICD-10-CM | POA: Diagnosis not present

## 2019-01-26 DIAGNOSIS — F4010 Social phobia, unspecified: Secondary | ICD-10-CM | POA: Diagnosis not present

## 2019-01-26 DIAGNOSIS — F3340 Major depressive disorder, recurrent, in remission, unspecified: Secondary | ICD-10-CM | POA: Diagnosis not present

## 2019-01-26 DIAGNOSIS — F411 Generalized anxiety disorder: Secondary | ICD-10-CM | POA: Diagnosis not present

## 2019-02-02 DIAGNOSIS — F4010 Social phobia, unspecified: Secondary | ICD-10-CM | POA: Diagnosis not present

## 2019-02-02 DIAGNOSIS — F411 Generalized anxiety disorder: Secondary | ICD-10-CM | POA: Diagnosis not present

## 2019-02-02 DIAGNOSIS — F3340 Major depressive disorder, recurrent, in remission, unspecified: Secondary | ICD-10-CM | POA: Diagnosis not present

## 2019-02-10 DIAGNOSIS — F3340 Major depressive disorder, recurrent, in remission, unspecified: Secondary | ICD-10-CM | POA: Diagnosis not present

## 2019-02-10 DIAGNOSIS — F4010 Social phobia, unspecified: Secondary | ICD-10-CM | POA: Diagnosis not present

## 2019-02-10 DIAGNOSIS — F411 Generalized anxiety disorder: Secondary | ICD-10-CM | POA: Diagnosis not present

## 2019-02-11 DIAGNOSIS — F411 Generalized anxiety disorder: Secondary | ICD-10-CM | POA: Diagnosis not present

## 2019-02-11 DIAGNOSIS — F3340 Major depressive disorder, recurrent, in remission, unspecified: Secondary | ICD-10-CM | POA: Diagnosis not present

## 2019-02-11 DIAGNOSIS — F4010 Social phobia, unspecified: Secondary | ICD-10-CM | POA: Diagnosis not present

## 2019-02-18 DIAGNOSIS — F3340 Major depressive disorder, recurrent, in remission, unspecified: Secondary | ICD-10-CM | POA: Diagnosis not present

## 2019-02-18 DIAGNOSIS — F4010 Social phobia, unspecified: Secondary | ICD-10-CM | POA: Diagnosis not present

## 2019-02-18 DIAGNOSIS — F411 Generalized anxiety disorder: Secondary | ICD-10-CM | POA: Diagnosis not present

## 2019-02-26 DIAGNOSIS — F3340 Major depressive disorder, recurrent, in remission, unspecified: Secondary | ICD-10-CM | POA: Diagnosis not present

## 2019-02-26 DIAGNOSIS — F4010 Social phobia, unspecified: Secondary | ICD-10-CM | POA: Diagnosis not present

## 2019-02-26 DIAGNOSIS — F411 Generalized anxiety disorder: Secondary | ICD-10-CM | POA: Diagnosis not present

## 2019-03-10 DIAGNOSIS — F3340 Major depressive disorder, recurrent, in remission, unspecified: Secondary | ICD-10-CM | POA: Diagnosis not present

## 2019-03-10 DIAGNOSIS — F4010 Social phobia, unspecified: Secondary | ICD-10-CM | POA: Diagnosis not present

## 2019-03-10 DIAGNOSIS — F411 Generalized anxiety disorder: Secondary | ICD-10-CM | POA: Diagnosis not present

## 2019-03-12 DIAGNOSIS — F4010 Social phobia, unspecified: Secondary | ICD-10-CM | POA: Diagnosis not present

## 2019-03-12 DIAGNOSIS — F411 Generalized anxiety disorder: Secondary | ICD-10-CM | POA: Diagnosis not present

## 2019-03-12 DIAGNOSIS — F3340 Major depressive disorder, recurrent, in remission, unspecified: Secondary | ICD-10-CM | POA: Diagnosis not present

## 2019-03-24 DIAGNOSIS — R1031 Right lower quadrant pain: Secondary | ICD-10-CM | POA: Diagnosis not present

## 2019-04-01 DIAGNOSIS — G43909 Migraine, unspecified, not intractable, without status migrainosus: Secondary | ICD-10-CM | POA: Diagnosis not present

## 2019-04-01 DIAGNOSIS — J45909 Unspecified asthma, uncomplicated: Secondary | ICD-10-CM | POA: Diagnosis not present

## 2019-04-01 DIAGNOSIS — R29898 Other symptoms and signs involving the musculoskeletal system: Secondary | ICD-10-CM | POA: Diagnosis not present

## 2019-04-08 DIAGNOSIS — F3340 Major depressive disorder, recurrent, in remission, unspecified: Secondary | ICD-10-CM | POA: Diagnosis not present

## 2019-04-08 DIAGNOSIS — F4010 Social phobia, unspecified: Secondary | ICD-10-CM | POA: Diagnosis not present

## 2019-04-08 DIAGNOSIS — F411 Generalized anxiety disorder: Secondary | ICD-10-CM | POA: Diagnosis not present

## 2019-04-19 DIAGNOSIS — F411 Generalized anxiety disorder: Secondary | ICD-10-CM | POA: Diagnosis not present

## 2019-04-19 DIAGNOSIS — F3340 Major depressive disorder, recurrent, in remission, unspecified: Secondary | ICD-10-CM | POA: Diagnosis not present

## 2019-04-19 DIAGNOSIS — F4010 Social phobia, unspecified: Secondary | ICD-10-CM | POA: Diagnosis not present

## 2019-04-20 DIAGNOSIS — H6121 Impacted cerumen, right ear: Secondary | ICD-10-CM | POA: Diagnosis not present

## 2019-04-20 DIAGNOSIS — H903 Sensorineural hearing loss, bilateral: Secondary | ICD-10-CM | POA: Diagnosis not present

## 2019-04-20 DIAGNOSIS — H8101 Meniere's disease, right ear: Secondary | ICD-10-CM | POA: Diagnosis not present

## 2019-04-20 DIAGNOSIS — R42 Dizziness and giddiness: Secondary | ICD-10-CM | POA: Diagnosis not present

## 2019-04-21 DIAGNOSIS — N951 Menopausal and female climacteric states: Secondary | ICD-10-CM | POA: Diagnosis not present

## 2019-04-21 DIAGNOSIS — F419 Anxiety disorder, unspecified: Secondary | ICD-10-CM | POA: Diagnosis not present

## 2019-04-21 DIAGNOSIS — R29898 Other symptoms and signs involving the musculoskeletal system: Secondary | ICD-10-CM | POA: Diagnosis not present

## 2019-05-12 DIAGNOSIS — F3340 Major depressive disorder, recurrent, in remission, unspecified: Secondary | ICD-10-CM | POA: Diagnosis not present

## 2019-05-12 DIAGNOSIS — F411 Generalized anxiety disorder: Secondary | ICD-10-CM | POA: Diagnosis not present

## 2019-05-12 DIAGNOSIS — F4010 Social phobia, unspecified: Secondary | ICD-10-CM | POA: Diagnosis not present

## 2019-05-18 DIAGNOSIS — Z1329 Encounter for screening for other suspected endocrine disorder: Secondary | ICD-10-CM | POA: Diagnosis not present

## 2019-05-18 DIAGNOSIS — Z13228 Encounter for screening for other metabolic disorders: Secondary | ICD-10-CM | POA: Diagnosis not present

## 2019-05-27 DIAGNOSIS — F4010 Social phobia, unspecified: Secondary | ICD-10-CM | POA: Diagnosis not present

## 2019-05-27 DIAGNOSIS — F3340 Major depressive disorder, recurrent, in remission, unspecified: Secondary | ICD-10-CM | POA: Diagnosis not present

## 2019-05-27 DIAGNOSIS — F411 Generalized anxiety disorder: Secondary | ICD-10-CM | POA: Diagnosis not present

## 2019-06-11 DIAGNOSIS — F411 Generalized anxiety disorder: Secondary | ICD-10-CM | POA: Diagnosis not present

## 2019-06-11 DIAGNOSIS — F3340 Major depressive disorder, recurrent, in remission, unspecified: Secondary | ICD-10-CM | POA: Diagnosis not present

## 2019-06-11 DIAGNOSIS — F4010 Social phobia, unspecified: Secondary | ICD-10-CM | POA: Diagnosis not present

## 2019-06-28 DIAGNOSIS — F3340 Major depressive disorder, recurrent, in remission, unspecified: Secondary | ICD-10-CM | POA: Diagnosis not present

## 2019-06-28 DIAGNOSIS — F4010 Social phobia, unspecified: Secondary | ICD-10-CM | POA: Diagnosis not present

## 2019-06-28 DIAGNOSIS — F411 Generalized anxiety disorder: Secondary | ICD-10-CM | POA: Diagnosis not present

## 2019-07-02 DIAGNOSIS — F411 Generalized anxiety disorder: Secondary | ICD-10-CM | POA: Diagnosis not present

## 2019-07-02 DIAGNOSIS — F3340 Major depressive disorder, recurrent, in remission, unspecified: Secondary | ICD-10-CM | POA: Diagnosis not present

## 2019-07-02 DIAGNOSIS — F4010 Social phobia, unspecified: Secondary | ICD-10-CM | POA: Diagnosis not present

## 2019-07-23 DIAGNOSIS — F411 Generalized anxiety disorder: Secondary | ICD-10-CM | POA: Diagnosis not present

## 2019-07-23 DIAGNOSIS — F3340 Major depressive disorder, recurrent, in remission, unspecified: Secondary | ICD-10-CM | POA: Diagnosis not present

## 2019-07-23 DIAGNOSIS — F4010 Social phobia, unspecified: Secondary | ICD-10-CM | POA: Diagnosis not present

## 2019-08-06 DIAGNOSIS — F411 Generalized anxiety disorder: Secondary | ICD-10-CM | POA: Diagnosis not present

## 2019-08-06 DIAGNOSIS — F4010 Social phobia, unspecified: Secondary | ICD-10-CM | POA: Diagnosis not present

## 2019-08-06 DIAGNOSIS — F3340 Major depressive disorder, recurrent, in remission, unspecified: Secondary | ICD-10-CM | POA: Diagnosis not present

## 2019-08-15 ENCOUNTER — Other Ambulatory Visit: Payer: Self-pay | Admitting: Allergy and Immunology

## 2019-08-30 DIAGNOSIS — F3340 Major depressive disorder, recurrent, in remission, unspecified: Secondary | ICD-10-CM | POA: Diagnosis not present

## 2019-08-30 DIAGNOSIS — F4010 Social phobia, unspecified: Secondary | ICD-10-CM | POA: Diagnosis not present

## 2019-08-30 DIAGNOSIS — F411 Generalized anxiety disorder: Secondary | ICD-10-CM | POA: Diagnosis not present

## 2019-09-27 DIAGNOSIS — F411 Generalized anxiety disorder: Secondary | ICD-10-CM | POA: Diagnosis not present

## 2019-09-27 DIAGNOSIS — F3340 Major depressive disorder, recurrent, in remission, unspecified: Secondary | ICD-10-CM | POA: Diagnosis not present

## 2019-09-27 DIAGNOSIS — F4010 Social phobia, unspecified: Secondary | ICD-10-CM | POA: Diagnosis not present

## 2019-10-11 DIAGNOSIS — F411 Generalized anxiety disorder: Secondary | ICD-10-CM | POA: Diagnosis not present

## 2019-10-11 DIAGNOSIS — F3340 Major depressive disorder, recurrent, in remission, unspecified: Secondary | ICD-10-CM | POA: Diagnosis not present

## 2019-10-11 DIAGNOSIS — F4010 Social phobia, unspecified: Secondary | ICD-10-CM | POA: Diagnosis not present

## 2019-10-29 DIAGNOSIS — F3340 Major depressive disorder, recurrent, in remission, unspecified: Secondary | ICD-10-CM | POA: Diagnosis not present

## 2019-10-29 DIAGNOSIS — F411 Generalized anxiety disorder: Secondary | ICD-10-CM | POA: Diagnosis not present

## 2019-10-29 DIAGNOSIS — F4010 Social phobia, unspecified: Secondary | ICD-10-CM | POA: Diagnosis not present

## 2019-11-12 DIAGNOSIS — A6000 Herpesviral infection of urogenital system, unspecified: Secondary | ICD-10-CM | POA: Insufficient documentation

## 2019-11-12 DIAGNOSIS — F419 Anxiety disorder, unspecified: Secondary | ICD-10-CM | POA: Insufficient documentation

## 2019-11-16 DIAGNOSIS — Z1231 Encounter for screening mammogram for malignant neoplasm of breast: Secondary | ICD-10-CM | POA: Diagnosis not present

## 2019-11-16 DIAGNOSIS — Z01419 Encounter for gynecological examination (general) (routine) without abnormal findings: Secondary | ICD-10-CM | POA: Diagnosis not present

## 2019-11-16 DIAGNOSIS — R319 Hematuria, unspecified: Secondary | ICD-10-CM | POA: Diagnosis not present

## 2019-11-16 DIAGNOSIS — F419 Anxiety disorder, unspecified: Secondary | ICD-10-CM | POA: Diagnosis not present

## 2019-11-16 DIAGNOSIS — Z6827 Body mass index (BMI) 27.0-27.9, adult: Secondary | ICD-10-CM | POA: Diagnosis not present

## 2019-11-16 DIAGNOSIS — R29898 Other symptoms and signs involving the musculoskeletal system: Secondary | ICD-10-CM | POA: Diagnosis not present

## 2019-11-18 ENCOUNTER — Other Ambulatory Visit: Payer: Self-pay | Admitting: Obstetrics and Gynecology

## 2019-11-18 ENCOUNTER — Other Ambulatory Visit: Payer: Self-pay

## 2019-11-18 ENCOUNTER — Other Ambulatory Visit (HOSPITAL_COMMUNITY): Payer: Self-pay | Admitting: Obstetrics and Gynecology

## 2019-11-18 ENCOUNTER — Ambulatory Visit (HOSPITAL_COMMUNITY)
Admission: RE | Admit: 2019-11-18 | Discharge: 2019-11-18 | Disposition: A | Discharge status: Home / Self Care | Payer: BC Managed Care – PPO | Source: Ambulatory Visit | Attending: Obstetrics and Gynecology | Admitting: Obstetrics and Gynecology

## 2019-11-18 DIAGNOSIS — J189 Pneumonia, unspecified organism: Secondary | ICD-10-CM

## 2019-11-18 DIAGNOSIS — R079 Chest pain, unspecified: Secondary | ICD-10-CM | POA: Diagnosis not present

## 2019-11-18 DIAGNOSIS — R928 Other abnormal and inconclusive findings on diagnostic imaging of breast: Secondary | ICD-10-CM

## 2019-11-18 DIAGNOSIS — R05 Cough: Secondary | ICD-10-CM | POA: Diagnosis not present

## 2019-11-19 DIAGNOSIS — F3340 Major depressive disorder, recurrent, in remission, unspecified: Secondary | ICD-10-CM | POA: Diagnosis not present

## 2019-11-19 DIAGNOSIS — F411 Generalized anxiety disorder: Secondary | ICD-10-CM | POA: Diagnosis not present

## 2019-11-19 DIAGNOSIS — F4010 Social phobia, unspecified: Secondary | ICD-10-CM | POA: Diagnosis not present

## 2019-11-26 DIAGNOSIS — J0101 Acute recurrent maxillary sinusitis: Secondary | ICD-10-CM | POA: Diagnosis not present

## 2019-11-26 DIAGNOSIS — H6123 Impacted cerumen, bilateral: Secondary | ICD-10-CM | POA: Diagnosis not present

## 2019-11-26 DIAGNOSIS — H9041 Sensorineural hearing loss, unilateral, right ear, with unrestricted hearing on the contralateral side: Secondary | ICD-10-CM | POA: Diagnosis not present

## 2019-11-26 DIAGNOSIS — J343 Hypertrophy of nasal turbinates: Secondary | ICD-10-CM | POA: Diagnosis not present

## 2019-11-26 DIAGNOSIS — H8101 Meniere's disease, right ear: Secondary | ICD-10-CM | POA: Diagnosis not present

## 2019-11-26 DIAGNOSIS — R0982 Postnasal drip: Secondary | ICD-10-CM | POA: Diagnosis not present

## 2019-11-26 DIAGNOSIS — R42 Dizziness and giddiness: Secondary | ICD-10-CM | POA: Diagnosis not present

## 2019-11-29 ENCOUNTER — Ambulatory Visit
Admission: RE | Admit: 2019-11-29 | Discharge: 2019-11-29 | Disposition: A | Discharge status: Home / Self Care | Payer: BC Managed Care – PPO | Source: Ambulatory Visit | Attending: Obstetrics and Gynecology | Admitting: Obstetrics and Gynecology

## 2019-11-29 ENCOUNTER — Ambulatory Visit: Admission: RE | Admit: 2019-11-29 | Payer: BC Managed Care – PPO | Source: Ambulatory Visit

## 2019-11-29 ENCOUNTER — Other Ambulatory Visit: Payer: Self-pay | Admitting: Obstetrics and Gynecology

## 2019-11-29 ENCOUNTER — Other Ambulatory Visit: Payer: Self-pay

## 2019-11-29 DIAGNOSIS — F411 Generalized anxiety disorder: Secondary | ICD-10-CM | POA: Diagnosis not present

## 2019-11-29 DIAGNOSIS — R922 Inconclusive mammogram: Secondary | ICD-10-CM | POA: Diagnosis not present

## 2019-11-29 DIAGNOSIS — F3340 Major depressive disorder, recurrent, in remission, unspecified: Secondary | ICD-10-CM | POA: Diagnosis not present

## 2019-11-29 DIAGNOSIS — R928 Other abnormal and inconclusive findings on diagnostic imaging of breast: Secondary | ICD-10-CM | POA: Diagnosis not present

## 2019-11-29 DIAGNOSIS — F4010 Social phobia, unspecified: Secondary | ICD-10-CM | POA: Diagnosis not present

## 2019-12-27 ENCOUNTER — Telehealth (INDEPENDENT_AMBULATORY_CARE_PROVIDER_SITE_OTHER): Payer: BC Managed Care – PPO | Admitting: Cardiology

## 2019-12-27 ENCOUNTER — Encounter: Payer: Self-pay | Admitting: Cardiology

## 2019-12-27 VITALS — BP 135/80 | HR 90 | Ht 64.0 in | Wt 160.0 lb

## 2019-12-27 DIAGNOSIS — Z716 Tobacco abuse counseling: Secondary | ICD-10-CM

## 2019-12-27 DIAGNOSIS — R079 Chest pain, unspecified: Secondary | ICD-10-CM | POA: Diagnosis not present

## 2019-12-27 DIAGNOSIS — F411 Generalized anxiety disorder: Secondary | ICD-10-CM

## 2019-12-27 NOTE — Patient Instructions (Signed)
Medication Instructions:  Continue current medications  *If you need a refill on your cardiac medications before your next appointment, please call your pharmacy*   Lab Work: None Ordered   Testing/Procedures: None Ordered   Follow-Up: At [facility], you and your health needs are our priority.  As part of our continuing mission to provide you with exceptional heart care, we have created designated Provider Care Teams.  These Care Teams include your primary Cardiologist (physician) and Advanced Practice Providers (APPs -  Physician Assistants and Nurse Practitioners) who all work together to provide you with the care you need, when you need it.  We recommend signing up for the patient portal called "MyChart".  Sign up information is provided on this After Visit Summary.  MyChart is used to connect with patients for Virtual Visits (Telemedicine).  Patients are able to view lab/test results, encounter notes, upcoming appointments, etc.  Non-urgent messages can be sent to your provider as well.   To learn more about what you can do with MyChart, go to [email or web address].    Your next appointment:   1 year(s)  The format for your next appointment:   In Person  Provider:   You may see David Harding, MD or one of the following Advanced Practice Providers on your designated Care Team:    Rhonda Barrett, PA-C  Kathryn Lawrence, DNP, ANP  Cadence Furth, NP     

## 2019-12-27 NOTE — Progress Notes (Signed)
Virtual Visit via Telephone Note   This visit type was conducted due to national recommendations for restrictions regarding the COVID-19 Pandemic (e.g. social distancing) in an effort to limit this patient's exposure and mitigate transmission in our community.  Due to her co-morbid illnesses, this patient is at least at moderate risk for complications without adequate follow up.  This format is felt to be most appropriate for this patient at this time.  The patient did not have access to video technology/had technical difficulties with video requiring transitioning to audio format only (telephone).  All issues noted in this document were discussed and addressed.  No physical exam could be performed with this format.  Please refer to the patient's chart for her  consent to telehealth for [facility].   The patient was identified using 2 identifiers.  Date:  12/27/2019   ID:  Sarah Reeves, DOB 1961/07/26, MRN [id number]  Patient Location: Home Provider Location: Home  PCP:  Dian Queen, MD  Cardiologist:  Glenetta Hew, MD  Electrophysiologist:  None   Evaluation Performed:  Follow-Up Visit  Chief Complaint:  none  History of Present Illness:    Sarah Reeves is a 59 y.o. female with chronic chest pain.  She had a low risk Myoview in 2016.  Other medical issues include treated hypertension, tobacco abuse, and chronic anxiety.  She lives at home with her husband who has dementia.  She is the primary caregiver.  Since we saw her last she is done well from a cardiac standpoint.  She uses an elliptical every day for 15 or 20 minutes without chest pain.  She does get occasional chest pain at rest, she will take aspirin and sometimes nitroglycerin with some relief.  She has been under a fair amount of stress under the Covid pandemic restrictions, she has not had her vaccines yet.  Since we saw her last she did have a bout of outpatient pneumonia treated with antibiotics.  The patient  does not have symptoms concerning for COVID-19 infection (fever, chills, cough, or new shortness of breath).    Past Medical History:  Diagnosis Date  . Angio-edema   . Anxiety   . Asthma   . Complication of anesthesia    takes alot to sedated  . Depression   . Fibromyalgia   . GERD (gastroesophageal reflux disease)   . Hepatitis C    (Dr. Earlean Shawl) Treated with Harvoni March-May 2016  . Herpes simplex   . Hyperlipemia   . Insomnia   . Mental disorder   . Neuropathy   . PONV (postoperative nausea and vomiting)   . Urticaria    Past Surgical History:  Procedure Laterality Date  . BREAST ENHANCEMENT SURGERY    . BREAST LUMPECTOMY WITH RADIOACTIVE SEED LOCALIZATION Left 12/26/2014   Procedure: LEFT BREAST LUMPECTOMY WITH RADIOACTIVE SEED LOCALIZATION;  Surgeon: Autumn Messing III, MD;  Location: [place];  Service: General;  Laterality: Left;  . BREAST REDUCTION SURGERY    . COLONOSCOPY    . EXPLORATORY LAPAROTOMY    . NM MYOVIEW LTD  07/07/15   Normal Myocardial Perfusion Scan. Low risk lexiscan nuclear study with minimal insignificant breast attenuation and normal myocardial perfusion and function; EF 53% without wall motion abnormalities and normal systolic thickening  . TEMPOROMANDIBULAR JOINT SURGERY    . TUBAL LIGATION    . UPPER GI ENDOSCOPY     x5     Current Meds  Medication Sig  . albuterol (VENTOLIN  HFA) 108 (90 Base) MCG/ACT inhaler albuterol sulfate HFA 90 mcg/actuation aerosol inhaler  INHALE 2 PUFFS BY MOUTH EVERY 4 HOURS  . Ascorbic Acid (VITAMIN C) 1000 MG tablet Take 1,000 mg by mouth daily.  Marland Kitchen aspirin EC 81 MG tablet Take 1 tablet (81 mg total) by mouth daily.  . cetirizine (ZYRTEC) 10 MG tablet Take 1 tablet (10 mg total) by mouth daily.  . cholecalciferol (VITAMIN D) 1000 UNITS tablet Take 2,000 Units by mouth daily.   . cyclobenzaprine (FLEXERIL) 10 MG tablet Take 10 mg by mouth 3 (three) times daily as needed for muscle spasms.  .  Diethylpropion HCl CR 75 MG TB24 diethylpropion ER 75 mg tablet,extended release  TAKE 1 TABLET BY MOUTH EVERY DAY  . DULoxetine (CYMBALTA) 30 MG capsule Take 30 mg by mouth daily.  Marland Kitchen ELMIRON 100 MG capsule Take 100 mg by mouth daily.  Marland Kitchen estradiol (VIVELLE-DOT) 0.075 MG/24HR Place 1 patch onto the skin 2 (two) times a week.  . gabapentin (NEURONTIN) 300 MG capsule Take 300 mg by mouth daily as needed.  Marland Kitchen HYDROcodone-acetaminophen (NORCO) 10-325 MG tablet TK 1 T PO Q 8 H PRN FOR PAIN  . hydrOXYzine (ATARAX/VISTARIL) 25 MG tablet Take 25 mg by mouth at bedtime.  Marland Kitchen LORazepam (ATIVAN) 2 MG tablet Take 2 mg by mouth 3 (three) times daily as needed.  . meclizine (ANTIVERT) 25 MG tablet meclizine 25 mg tablet  TAKE 1 TABLET BY MOUTH EVERY 6 HOURS AS NEEDED FOR DIZZINESS  . montelukast (SINGULAIR) 10 MG tablet Take 1 tablet (10 mg total) by mouth at bedtime.  . nitroGLYCERIN (NITROSTAT) 0.4 MG SL tablet Place 1 tablet (0.4 mg total) under the tongue every 5 (five) minutes as needed for chest pain.  . progesterone (ENDOMETRIN) 100 MG vaginal insert Place 100 mg vaginally at bedtime.  . promethazine (PHENERGAN) 25 MG tablet promethazine 25 mg tablet  TAKE 1 TABLET BY MOUTH EVERY 4 HOURS AS NEEDED FOR NAUSEA  . triamterene-hydrochlorothiazide (DYAZIDE) 37.5-25 MG capsule Take 1 capsule by mouth daily.  . valACYclovir (VALTREX) 500 MG tablet Take 500 mg by mouth as needed.  . ziprasidone (GEODON) 40 MG capsule Take 40 mg by mouth 2 (two) times daily with a meal.  . zolpidem (AMBIEN) 10 MG tablet Take 10 mg by mouth at bedtime.  . [DISCONTINUED] DULoxetine 40 MG CPEP Take 40 mg by mouth daily.  . [DISCONTINUED] ziprasidone (GEODON) 40 MG capsule Take 1 capsule (40 mg total) by mouth every morning. (Patient taking differently: Take 40 mg by mouth 2 (two) times daily with a meal. )  . [DISCONTINUED] zolpidem (AMBIEN) 5 MG tablet Take 5 mg by mouth as needed.     Allergies:   Oysters [shellfish allergy],  Zoloft [sertraline hcl], Latex, Amitriptyline, Clindamycin/lincomycin, Penicillins, Pentosan polysulfate sodium, Topamax [topiramate], Uribel [urelle], and Sulfamethoxazole-trimethoprim   Social History   Tobacco Use  . Smoking status: Current Every Day Smoker    Packs/day: 0.50    Types: Cigarettes  . Smokeless tobacco: Never Used  Substance Use Topics  . Alcohol use: No  . Drug use: No    Comment: did have hx drug abuse as teen     Family Hx: The patient's family history includes Alzheimer's disease in her maternal aunt and maternal grandmother; Breast cancer in her mother; Colon cancer in her father; Diabetes in her mother; Heart disease in her father and mother; Hypothyroidism in her brother; Lung disease in her mother; Skin cancer in her maternal  aunt.  ROS:   Please see the history of present illness.    All other systems reviewed and are negative.   Prior CV studies:   The following studies were reviewed today: Myoview 2016  Labs/Other Tests and Data Reviewed:    EKG:  An ECG dated 06/28/2017 was personally reviewed today and demonstrated:  NSR- HR 78 QTC 442  Recent Labs: No results found for requested labs within last 8760 hours.   Recent Lipid Panel Lab Results  Component Value Date/Time   CHOL 223 (H) 11/10/2018 10:14 AM   TRIG 125 11/10/2018 10:14 AM   HDL 78 11/10/2018 10:14 AM   CHOLHDL 2.9 11/10/2018 10:14 AM   CHOLHDL 3.3 06/27/2018 06:23 AM   LDLCALC 120 (H) 11/10/2018 10:14 AM    Wt Readings from Last 3 Encounters:  12/27/19 160 lb (72.6 kg)  11/09/18 140 lb (63.5 kg)  08/11/18 133 lb (60.3 kg)     Objective:    Vital Signs:  BP 135/80   Pulse 90   Ht 5\' 4"  (1.626 m)   Wt 160 lb (72.6 kg)   BMI 27.46 kg/m    VITAL SIGNS:  reviewed  ASSESSMENT & PLAN:    Chest pain- Not felt to be secondary to angina-low risk Myoview 2016  Anxiety- On multiple medications-   Smoker- 1 PPD   COVID-19 Education: The signs and symptoms of  COVID-19 were discussed with the patient and how to seek care for testing (follow up with PCP or arrange E-visit).  The importance of social distancing was discussed today.  Time:   Today, I have spent 10 minutes with the patient with telehealth technology discussing the above problems.     Medication Adjustments/Labs and Tests Ordered: Current medicines are reviewed at length with the patient today.  Concerns regarding medicines are outlined above.   Tests Ordered: No orders of the defined types were placed in this encounter.   Medication Changes: No orders of the defined types were placed in this encounter.   Follow Up:  In Person Dr Ellyn Hack in one year-check EKG (QTc) on multiple psychiatric medications.  Angelena Form, PA-C  12/27/2019 10:23 AM    [facility]

## 2020-01-11 DIAGNOSIS — F411 Generalized anxiety disorder: Secondary | ICD-10-CM | POA: Diagnosis not present

## 2020-01-24 DIAGNOSIS — F411 Generalized anxiety disorder: Secondary | ICD-10-CM | POA: Diagnosis not present

## 2020-02-08 DIAGNOSIS — F411 Generalized anxiety disorder: Secondary | ICD-10-CM | POA: Diagnosis not present

## 2020-02-29 DIAGNOSIS — F411 Generalized anxiety disorder: Secondary | ICD-10-CM | POA: Diagnosis not present

## 2020-03-09 DIAGNOSIS — L259 Unspecified contact dermatitis, unspecified cause: Secondary | ICD-10-CM | POA: Diagnosis not present

## 2020-03-29 DIAGNOSIS — F339 Major depressive disorder, recurrent, unspecified: Secondary | ICD-10-CM | POA: Diagnosis not present

## 2020-03-29 DIAGNOSIS — F411 Generalized anxiety disorder: Secondary | ICD-10-CM | POA: Diagnosis not present

## 2020-04-13 DIAGNOSIS — F339 Major depressive disorder, recurrent, unspecified: Secondary | ICD-10-CM | POA: Diagnosis not present

## 2020-04-13 DIAGNOSIS — F4010 Social phobia, unspecified: Secondary | ICD-10-CM | POA: Diagnosis not present

## 2020-04-13 DIAGNOSIS — F411 Generalized anxiety disorder: Secondary | ICD-10-CM | POA: Diagnosis not present

## 2020-04-13 DIAGNOSIS — F410 Panic disorder [episodic paroxysmal anxiety] without agoraphobia: Secondary | ICD-10-CM | POA: Diagnosis not present

## 2020-04-18 DIAGNOSIS — F411 Generalized anxiety disorder: Secondary | ICD-10-CM | POA: Diagnosis not present

## 2020-04-18 DIAGNOSIS — F339 Major depressive disorder, recurrent, unspecified: Secondary | ICD-10-CM | POA: Diagnosis not present

## 2020-05-04 DIAGNOSIS — F411 Generalized anxiety disorder: Secondary | ICD-10-CM | POA: Diagnosis not present

## 2020-05-04 DIAGNOSIS — F339 Major depressive disorder, recurrent, unspecified: Secondary | ICD-10-CM | POA: Diagnosis not present

## 2020-05-11 DIAGNOSIS — F339 Major depressive disorder, recurrent, unspecified: Secondary | ICD-10-CM | POA: Diagnosis not present

## 2020-05-11 DIAGNOSIS — F410 Panic disorder [episodic paroxysmal anxiety] without agoraphobia: Secondary | ICD-10-CM | POA: Diagnosis not present

## 2020-05-11 DIAGNOSIS — F411 Generalized anxiety disorder: Secondary | ICD-10-CM | POA: Diagnosis not present

## 2020-05-11 DIAGNOSIS — F4010 Social phobia, unspecified: Secondary | ICD-10-CM | POA: Diagnosis not present

## 2020-05-17 ENCOUNTER — Telehealth: Payer: Self-pay | Admitting: Cardiology

## 2020-05-17 NOTE — Telephone Encounter (Signed)
Pt c/o BP issue: STAT if pt c/o blurred vision, one-sided weakness or slurred speech  1. What are your last 5 BP readings?  HR: 180/70  HR: 45  2. Are you having any other symptoms (ex. Dizziness, headache, blurred vision, passed out)?  No  3. What is your BP issue? Patient states she would like to make Dr. Ellyn Hack aware of incident that took place last weekend. She states her BP became extremely elevated. However, she states she is feeling much better this week. Please call to discuss.

## 2020-05-17 NOTE — Telephone Encounter (Signed)
Pt called stating she wanted to make MD aware of an incident that happened over the weekend. She report EMS was contacted due feeling light headed and SOB. She report when EMS arrived her BP was 170/150 HR 45. She also voiced EMS asked did she have afib. Pt voiced EMS wanted to transport her to the hospital but she refused. Pt voiced she feels fine now and BP today was 109/62 HR 105. Pt report she is calling to see if MD feels she make and appointment to have an EKG performed.

## 2020-05-18 NOTE — Telephone Encounter (Signed)
Blood pressure cannot be 170/150.    It would be nice to build to get an event monitor recording of her heart rate when she does have a high blood pressure.  In and of itself, not really sure what to make of it without having additional data and knowing what else was going on.  If episodes happen again, maybe we can get a 1 week Zio patch.  Glenetta Hew, MD

## 2020-05-19 NOTE — Telephone Encounter (Signed)
Pt updated and verbalized understanding.  

## 2020-05-23 DIAGNOSIS — J342 Deviated nasal septum: Secondary | ICD-10-CM | POA: Diagnosis not present

## 2020-05-23 DIAGNOSIS — H6123 Impacted cerumen, bilateral: Secondary | ICD-10-CM | POA: Diagnosis not present

## 2020-05-23 DIAGNOSIS — H903 Sensorineural hearing loss, bilateral: Secondary | ICD-10-CM | POA: Diagnosis not present

## 2020-05-23 DIAGNOSIS — E785 Hyperlipidemia, unspecified: Secondary | ICD-10-CM | POA: Diagnosis not present

## 2020-05-23 DIAGNOSIS — J0101 Acute recurrent maxillary sinusitis: Secondary | ICD-10-CM | POA: Diagnosis not present

## 2020-05-23 DIAGNOSIS — R42 Dizziness and giddiness: Secondary | ICD-10-CM | POA: Diagnosis not present

## 2020-05-23 DIAGNOSIS — H8101 Meniere's disease, right ear: Secondary | ICD-10-CM | POA: Diagnosis not present

## 2020-05-23 DIAGNOSIS — J343 Hypertrophy of nasal turbinates: Secondary | ICD-10-CM | POA: Diagnosis not present

## 2020-06-01 DIAGNOSIS — F411 Generalized anxiety disorder: Secondary | ICD-10-CM | POA: Diagnosis not present

## 2020-06-01 DIAGNOSIS — F339 Major depressive disorder, recurrent, unspecified: Secondary | ICD-10-CM | POA: Diagnosis not present

## 2020-06-09 ENCOUNTER — Ambulatory Visit: Payer: BC Managed Care – PPO | Admitting: Adult Health

## 2020-06-13 ENCOUNTER — Ambulatory Visit: Payer: BC Managed Care – PPO | Admitting: Allergy & Immunology

## 2020-06-13 ENCOUNTER — Other Ambulatory Visit: Payer: Self-pay

## 2020-06-13 ENCOUNTER — Encounter: Payer: Self-pay | Admitting: Allergy & Immunology

## 2020-06-13 VITALS — BP 126/82 | HR 103 | Temp 98.7°F | Resp 16 | Ht 65.0 in | Wt 154.8 lb

## 2020-06-13 DIAGNOSIS — J4540 Moderate persistent asthma, uncomplicated: Secondary | ICD-10-CM | POA: Diagnosis not present

## 2020-06-13 DIAGNOSIS — J310 Chronic rhinitis: Secondary | ICD-10-CM | POA: Diagnosis not present

## 2020-06-13 MED ORDER — LEVALBUTEROL HCL 1.25 MG/3ML IN NEBU: 1.2500 mg | INHALATION_SOLUTION | RESPIRATORY_TRACT | 5 refills | Status: DC | PRN

## 2020-06-13 MED ORDER — ALBUTEROL SULFATE HFA 108 (90 BASE) MCG/ACT IN AERS: 2.0000 | INHALATION_SPRAY | RESPIRATORY_TRACT | 5 refills | Status: DC | PRN

## 2020-06-13 MED ORDER — BUDESONIDE-FORMOTEROL FUMARATE 160-4.5 MCG/ACT IN AERO: 2.0000 | INHALATION_SPRAY | Freq: Two times a day (BID) | RESPIRATORY_TRACT | 5 refills | Status: DC

## 2020-06-13 NOTE — Patient Instructions (Addendum)
1. Moderate persistent asthma, uncomplicated - Lung testing looks fairly good today. - We are not can make any medication changes at this point. - Spacer use reviewed. - Nebulizer sample and demonstration provided.  - Daily controller medication(s): Symbicort 160/4.20mcg two puffs twice daily with spacer - Prior to physical activity: Ventolin 2 puffs 10-15 minutes before physical activity. - Rescue medications: Xopenex nebulizer treatment or Ventolin 4 puffs every 4-6 hours as needed - Asthma control goals:  * Full participation in all desired activities (may need albuterol before activity) * Albuterol use two time or less a week on average (not counting use with activity) * Cough interfering with sleep two time or less a month * Oral steroids no more than once a year * No hospitalizations  2. Non-allergic rhinitis - Continue with cetirizine 10mg  daily.   - Continue with the nasal spray as needed.   3. Return in about 3 months (around 09/13/2020).    Please inform us of any Emergency Department visits, hospitalizations, or changes in symptoms. Call us before going to the ED for breathing or allergy symptoms since we might be able to fit you in for a sick visit. Feel free to contact us anytime with any questions, problems, or concerns.  It was a pleasure to meet you today!  Websites that have reliable patient information: 1. American Academy of Asthma, Allergy, and Immunology: www.aaaai.org 2. Food Allergy Research and Education (FARE): foodallergy.org 3. Mothers of Asthmatics: http://www.asthmacommunitynetwork.org 4. American College of Allergy, Asthma, and Immunology: www.acaai.org   COVID-19 Vaccine Information can be found at: ShippingScam.co.uk For questions related to vaccine distribution or appointments, please email vaccine@[employer] .com or call [phone].     "Like" [place] on Facebook and Instagram for our latest  updates!        Make sure you are registered to vote! If you have moved or changed any of your contact information, you will need to get this updated before voting!  In some cases, you MAY be able to register to vote online: [email or web address]

## 2020-06-13 NOTE — Progress Notes (Signed)
FOLLOW UP  Date of Service/Encounter:  06/13/20   Assessment:   Moderate persistent asthma, uncomplicated  Non-allergic rhinitis  Plan/Recommendations:   1. Moderate persistent asthma, uncomplicated - Lung testing looks fairly good today. - We are not can make any medication changes at this point. - Spacer use reviewed. - Nebulizer sample and demonstration provided.  - Daily controller medication(s): Symbicort 160/4.35mcg two puffs twice daily with spacer - Prior to physical activity: Ventolin 2 puffs 10-15 minutes before physical activity. - Rescue medications: Xopenex nebulizer treatment or Ventolin 4 puffs every 4-6 hours as needed - Asthma control goals:  * Full participation in all desired activities (may need albuterol before activity) * Albuterol use two time or less a week on average (not counting use with activity) * Cough interfering with sleep two time or less a month * Oral steroids no more than once a year * No hospitalizations  2. Non-allergic rhinitis - Continue with cetirizine 10mg  daily.   - Continue with the nasal spray as needed.   3. Return in about 3 months (around 09/13/2020).    Subjective:   Sarah Reeves is a 59 y.o. female presenting today for follow up of  Chief Complaint  Patient presents with  . Cough    Sarah Reeves has a history of the following: Patient Active Problem List   Diagnosis Date Noted  . 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 Post traumatic stress disorder (PTSD) 06/26/2018  . Generalized anxiety disorder 06/26/2018  . MDD (major depressive disorder), recurrent, severe, with psychosis ([place]) 06/26/2018  . Balance problem 01/28/2018  . Tremor 01/28/2018  . Tobacco abuse counseling 07/25/2015  . Chest pain at rest 06/26/2015  . Dizziness of unknown cause 06/26/2015  . Hyperglycemia 06/26/2015  . Paresthesia 06/20/2015  . Suicidal ideation 06/06/2013  . Major depressive disorder, recurrent, severe with  psychotic features ([place]) 06/06/2013  . Alcohol addiction ([place]) 04/30/2013  . HEPATITIS C 06/30/2007  . ALLERGIC RHINITIS 06/30/2007  . ASTHMA 06/30/2007  . GERD 06/30/2007    History obtained from: chart review and patient.  Sarah Reeves is a 59 y.o. female presenting for a follow up visit. She was last seen in October 2019. At that time, she was diagnosed with Eustachian tube dyusfction as well as perennial allergic rhinitis, GERD, and a shellfish allergy. She was start on montelukast 10mg  daily as well as Nasacort. She was continued on omeprazole 20mg  daily as well as the PRN use of cetirizine and epinephrine.   She had an extensive lab workup done, including environmental allergy panel, immunoglobulin, Pneumococcal titers, tetanus titer, inflammatory markers, ANA, and shellfish panel.   In the interim, she has continued to have a cough. This has been ongoing for a couple of years. She does have GERD but manages this with dietary changes. She reports that she wakes up with a "mouth full of liquid".   Asthma/Respiratory Symptom History: She tells me that her oxygen level has been around 92 or 93. It was better today because she took a "lot of stuff". She has been using her husband's Ventolin as well as his Symbicort. She does smoke, close to a pack per day. Right now she is doing the Symbicort only periodically. She has been on that for years with COPD. She needed prednisone, but she would prefer to stay off of the prednisone. She does not like how she feels on that.   Allergic Rhinitis Symptom History: She was having some sneezing and coughing  since she mowed the grass today. She was 'bad off" this morning.    She reports that she has had "double pneumonia" forever since she was a baby.   She has had a lot of antibiotics reactions.   Otherwise, there have been no changes to her past medical history, surgical history, family history, or social history.    Review of Systems  Constitutional:  Negative.  Negative for fever, malaise/fatigue and weight loss.  HENT: Negative for congestion, ear discharge, ear pain and sinus pain.   Eyes: Negative for pain, discharge and redness.  Respiratory: Positive for cough and sputum production. Negative for shortness of breath, wheezing and stridor.   Cardiovascular: Negative.  Negative for chest pain and palpitations.  Gastrointestinal: Negative for abdominal pain, heartburn, nausea and vomiting.  Skin: Negative.  Negative for itching and rash.  Neurological: Negative for dizziness and headaches.  Endo/Heme/Allergies: Negative for environmental allergies. Does not bruise/bleed easily.       Objective:   Blood pressure 126/82, pulse (!) 103, temperature 98.7 F (37.1 C), temperature source Temporal, resp. rate 16, height 5\' 5"  (1.651 m), weight 154 lb 12.8 oz (70.2 kg), SpO2 96 %. Body mass index is 25.76 kg/m.   Physical Exam:  Physical Exam Constitutional:      Appearance: She is well-developed.     Comments: Wet intermittent cough.   HENT:     Head: Normocephalic and atraumatic.     Right Ear: Tympanic membrane, ear canal and external ear normal.     Left Ear: Tympanic membrane and ear canal normal.     Nose: No nasal deformity, septal deviation, mucosal edema or rhinorrhea.     Right Sinus: No maxillary sinus tenderness or frontal sinus tenderness.     Left Sinus: No maxillary sinus tenderness or frontal sinus tenderness.     Mouth/Throat:     Mouth: Mucous membranes are not pale and not dry.     Pharynx: Uvula midline.  Eyes:     General:        Right eye: No discharge.        Left eye: No discharge.     Conjunctiva/sclera: Conjunctivae normal.     Right eye: Right conjunctiva is not injected. No chemosis.    Left eye: Left conjunctiva is not injected. No chemosis.    Pupils: Pupils are equal, round, and reactive to light.  Cardiovascular:     Rate and Rhythm: Normal rate and regular rhythm.     Heart sounds: Normal  heart sounds.  Pulmonary:     Effort: Pulmonary effort is normal. No tachypnea, accessory muscle usage or respiratory distress.     Breath sounds: Normal breath sounds. No wheezing, rhonchi or rales.     Comments: Moving air well in all lung fields. No increased work of breathing noted.  Chest:     Chest wall: No tenderness.  Lymphadenopathy:     Cervical: No cervical adenopathy.  Skin:    Coloration: Skin is not pale.     Findings: No abrasion, erythema, petechiae or rash. Rash is not papular, urticarial or vesicular.     Comments: No eczematous lesions noted.   Neurological:     Mental Status: She is alert.  Psychiatric:        Behavior: Behavior is cooperative.      Diagnostic studies:    Spirometry: results normal (FEV1: 2.30/84%, FVC: 3.24/93%, FEV1/FVC: 71%).    Spirometry consistent with normal pattern.   Allergy Studies: none  Salvatore Marvel, MD  [facility]

## 2020-06-14 ENCOUNTER — Encounter: Payer: Self-pay | Admitting: Allergy & Immunology

## 2020-06-21 ENCOUNTER — Ambulatory Visit: Payer: BC Managed Care – PPO | Admitting: Medical

## 2020-06-21 DIAGNOSIS — J4540 Moderate persistent asthma, uncomplicated: Secondary | ICD-10-CM | POA: Diagnosis not present

## 2020-07-03 DIAGNOSIS — F411 Generalized anxiety disorder: Secondary | ICD-10-CM | POA: Diagnosis not present

## 2020-07-03 DIAGNOSIS — F339 Major depressive disorder, recurrent, unspecified: Secondary | ICD-10-CM | POA: Diagnosis not present

## 2020-07-10 DIAGNOSIS — R1031 Right lower quadrant pain: Secondary | ICD-10-CM | POA: Diagnosis not present

## 2020-07-10 DIAGNOSIS — G8929 Other chronic pain: Secondary | ICD-10-CM | POA: Diagnosis not present

## 2020-07-10 DIAGNOSIS — N950 Postmenopausal bleeding: Secondary | ICD-10-CM | POA: Diagnosis not present

## 2020-07-19 DIAGNOSIS — F4010 Social phobia, unspecified: Secondary | ICD-10-CM | POA: Diagnosis not present

## 2020-07-19 DIAGNOSIS — F410 Panic disorder [episodic paroxysmal anxiety] without agoraphobia: Secondary | ICD-10-CM | POA: Diagnosis not present

## 2020-07-19 DIAGNOSIS — F339 Major depressive disorder, recurrent, unspecified: Secondary | ICD-10-CM | POA: Diagnosis not present

## 2020-07-19 DIAGNOSIS — F411 Generalized anxiety disorder: Secondary | ICD-10-CM | POA: Diagnosis not present

## 2020-07-22 DIAGNOSIS — J4540 Moderate persistent asthma, uncomplicated: Secondary | ICD-10-CM | POA: Diagnosis not present

## 2020-08-08 DIAGNOSIS — F411 Generalized anxiety disorder: Secondary | ICD-10-CM | POA: Diagnosis not present

## 2020-08-08 DIAGNOSIS — F410 Panic disorder [episodic paroxysmal anxiety] without agoraphobia: Secondary | ICD-10-CM | POA: Diagnosis not present

## 2020-08-08 DIAGNOSIS — F4010 Social phobia, unspecified: Secondary | ICD-10-CM | POA: Diagnosis not present

## 2020-08-08 DIAGNOSIS — F339 Major depressive disorder, recurrent, unspecified: Secondary | ICD-10-CM | POA: Diagnosis not present

## 2020-08-09 DIAGNOSIS — F411 Generalized anxiety disorder: Secondary | ICD-10-CM | POA: Diagnosis not present

## 2020-08-09 DIAGNOSIS — F410 Panic disorder [episodic paroxysmal anxiety] without agoraphobia: Secondary | ICD-10-CM | POA: Diagnosis not present

## 2020-08-09 DIAGNOSIS — F339 Major depressive disorder, recurrent, unspecified: Secondary | ICD-10-CM | POA: Diagnosis not present

## 2020-08-10 DIAGNOSIS — Z23 Encounter for immunization: Secondary | ICD-10-CM | POA: Diagnosis not present

## 2020-08-29 DIAGNOSIS — F339 Major depressive disorder, recurrent, unspecified: Secondary | ICD-10-CM | POA: Diagnosis not present

## 2020-08-29 DIAGNOSIS — F410 Panic disorder [episodic paroxysmal anxiety] without agoraphobia: Secondary | ICD-10-CM | POA: Diagnosis not present

## 2020-08-29 DIAGNOSIS — F4010 Social phobia, unspecified: Secondary | ICD-10-CM | POA: Diagnosis not present

## 2020-08-29 DIAGNOSIS — F411 Generalized anxiety disorder: Secondary | ICD-10-CM | POA: Diagnosis not present

## 2020-09-19 ENCOUNTER — Ambulatory Visit: Payer: BC Managed Care – PPO | Admitting: Allergy & Immunology

## 2020-09-19 DIAGNOSIS — F4010 Social phobia, unspecified: Secondary | ICD-10-CM | POA: Diagnosis not present

## 2020-09-19 DIAGNOSIS — F339 Major depressive disorder, recurrent, unspecified: Secondary | ICD-10-CM | POA: Diagnosis not present

## 2020-09-19 DIAGNOSIS — F411 Generalized anxiety disorder: Secondary | ICD-10-CM | POA: Diagnosis not present

## 2020-09-19 DIAGNOSIS — H5203 Hypermetropia, bilateral: Secondary | ICD-10-CM | POA: Diagnosis not present

## 2020-09-19 DIAGNOSIS — F410 Panic disorder [episodic paroxysmal anxiety] without agoraphobia: Secondary | ICD-10-CM | POA: Diagnosis not present

## 2020-09-19 DIAGNOSIS — H524 Presbyopia: Secondary | ICD-10-CM | POA: Diagnosis not present

## 2020-09-19 DIAGNOSIS — Z135 Encounter for screening for eye and ear disorders: Secondary | ICD-10-CM | POA: Diagnosis not present

## 2020-09-23 DIAGNOSIS — J209 Acute bronchitis, unspecified: Secondary | ICD-10-CM | POA: Diagnosis not present

## 2020-09-23 DIAGNOSIS — Z1152 Encounter for screening for COVID-19: Secondary | ICD-10-CM | POA: Diagnosis not present

## 2020-09-28 DIAGNOSIS — H2513 Age-related nuclear cataract, bilateral: Secondary | ICD-10-CM | POA: Diagnosis not present

## 2020-10-02 DIAGNOSIS — F411 Generalized anxiety disorder: Secondary | ICD-10-CM | POA: Diagnosis not present

## 2020-10-02 DIAGNOSIS — F339 Major depressive disorder, recurrent, unspecified: Secondary | ICD-10-CM | POA: Diagnosis not present

## 2020-10-02 DIAGNOSIS — F4010 Social phobia, unspecified: Secondary | ICD-10-CM | POA: Diagnosis not present

## 2020-10-17 ENCOUNTER — Ambulatory Visit: Payer: BC Managed Care – PPO | Admitting: Allergy & Immunology

## 2020-10-24 DIAGNOSIS — F411 Generalized anxiety disorder: Secondary | ICD-10-CM | POA: Diagnosis not present

## 2020-10-24 DIAGNOSIS — F339 Major depressive disorder, recurrent, unspecified: Secondary | ICD-10-CM | POA: Diagnosis not present

## 2020-10-31 ENCOUNTER — Encounter: Payer: Self-pay | Admitting: Allergy & Immunology

## 2020-10-31 ENCOUNTER — Other Ambulatory Visit: Payer: Self-pay

## 2020-10-31 ENCOUNTER — Ambulatory Visit (INDEPENDENT_AMBULATORY_CARE_PROVIDER_SITE_OTHER): Payer: BC Managed Care – PPO | Admitting: Allergy & Immunology

## 2020-10-31 VITALS — Ht 65.0 in | Wt 154.0 lb

## 2020-10-31 DIAGNOSIS — J310 Chronic rhinitis: Secondary | ICD-10-CM | POA: Diagnosis not present

## 2020-10-31 DIAGNOSIS — J449 Chronic obstructive pulmonary disease, unspecified: Secondary | ICD-10-CM | POA: Diagnosis not present

## 2020-10-31 MED ORDER — ALBUTEROL SULFATE HFA 108 (90 BASE) MCG/ACT IN AERS: 2.0000 | INHALATION_SPRAY | RESPIRATORY_TRACT | 3 refills | Status: DC | PRN

## 2020-10-31 MED ORDER — PREDNISONE 10 MG PO TABS: ORAL_TABLET | ORAL | 0 refills | Status: DC

## 2020-10-31 MED ORDER — MONTELUKAST SODIUM 10 MG PO TABS: 10.0000 mg | ORAL_TABLET | Freq: Every day | ORAL | 5 refills | Status: DC

## 2020-10-31 MED ORDER — BUDESONIDE-FORMOTEROL FUMARATE 160-4.5 MCG/ACT IN AERO: 2.0000 | INHALATION_SPRAY | Freq: Two times a day (BID) | RESPIRATORY_TRACT | 5 refills | Status: DC

## 2020-10-31 NOTE — Patient Instructions (Signed)
1. Moderate persistent asthma, uncomplicated - Lung testing not done since this was a televisit.  - We are going to start a low dose prednisone burst.  - Daily controller medication(s): Symbicort 160/4.32mcg two puffs twice daily with spacer - Prior to physical activity: Ventolin 2 puffs 10-15 minutes before physical activity. - Rescue medications: Xopenex nebulizer treatment or Ventolin 4 puffs every 4-6 hours as needed - Asthma control goals:  * Full participation in all desired activities (may need albuterol before activity) * Albuterol use two time or less a week on average (not counting use with activity) * Cough interfering with sleep two time or less a month * Oral steroids no more than once a year * No hospitalizations  2. Non-allergic rhinitis - Continue with cetirizine 10mg  daily.   - Continue with the nasal spray as needed.   3. No follow-ups on file.    Please inform us of any Emergency Department visits, hospitalizations, or changes in symptoms. Call us before going to the ED for breathing or allergy symptoms since we might be able to fit you in for a sick visit. Feel free to contact us anytime with any questions, problems, or concerns.  It was a pleasure to talk to you today today!  Websites that have reliable patient information: 1. American Academy of Asthma, Allergy, and Immunology: www.aaaai.org 2. Food Allergy Research and Education (FARE): foodallergy.org 3. Mothers of Asthmatics: http://www.asthmacommunitynetwork.org 4. American College of Allergy, Asthma, and Immunology: www.acaai.org   COVID-19 Vaccine Information can be found at: ShippingScam.co.uk For questions related to vaccine distribution or appointments, please email [email or web address] or call [phone].     "Like" [place] on Facebook and Instagram for our latest updates!       Make sure you are registered to vote! If you have moved or  changed any of your contact information, you will need to get this updated before voting!  In some cases, you MAY be able to register to vote online: [email or web address]

## 2020-10-31 NOTE — Progress Notes (Signed)
RE: Sarah Reeves MRN: [id number] DOB: [birth year] Date of Telemedicine Visit: 10/31/2020  Referring provider: Dian Queen, MD Primary care provider: Dian Queen, MD  Chief Complaint: COPD (Oxygen has been low 91-92 even after respiratory infection a month ago. Has been having clear liquid that she has to ), Asthma (Has not been short of breathe often. Uses husband ), and Gastroesophageal Reflux (The cause of white sputum in the morning so uses husband's rabeprazole 20mg  )   Telemedicine Follow Up Visit via Telephone: I connected with Sarah Reeves for a follow up on 10/31/20 by telephone and verified that I am speaking with the correct person using two identifiers.   I discussed the limitations, risks, security and privacy concerns of performing an evaluation and management service by telephone and the availability of in person appointments. I also discussed with the patient that there may be a patient responsible charge related to this service. The patient expressed understanding and agreed to proceed.  Patient is at home.  Provider is at the office.  Visit start time: 1:32 PM Visit end time: 1:49 PM Insurance consent/check in by: Mel Almond Medical consent and medical assistant/nurse: Garlon Hatchet  History of Present Illness:  She is a 60 y.o. female, who is being followed for moderate persistent asthma as well as non-allergic rhinitis. Her previous allergy office visit was in August 2021 with myself. Sarah Reeves is a 60 y.o. female presenting for a follow up visit.  She was last seen in August 2021.  At that time, her lung testing looks fairly good.  We continue with Symbicort 160/4.5 mcg 2 puffs twice daily as well as Ventolin as needed.  For her nonallergic rhinitis, will continue with cetirizine as well as nasal sprays.  Since the last visit, she has mostly done well. Her husband is in his 29s and is coughing up blood and has pneumonia. She is fully vaccinated to COVID 19 but does not  have the booster.   Asthma/Respiratory Symptom History: Her pulse ox goes down to 88 at times. It did go up to 92%. She remains on the Symbicort two puffs twice daily. She will miss some occasionally. She uses her husband's Ventolin, which she feels does not make her "nerovus and jumpy". The last time that she took the prednisone was a "while ago". She has a history of anxiety and is on a bunch of mood medications that does not "agree with [my] medicine".   She was on prednisone for an allergic reactions a "few months back" and a "good while ago". She did have a CXR around one month ago. She is very vague about her symptom course and it is hard to see how bad her symptoms truly are over the phone. She does not have a pulse ox at home.   Allergic Rhinitis Symptom History: She does have a deviated septum and will have a "bad time of it". She has some spray that she got at the drug store. She also uses "low dose Sudafed". She was on azithromycin which was started a month ago/. She was diagnosed with a URI and placed on azithromycin.   Otherwise, there have been no changes to her past medical history, surgical history, family history, or social history.  Assessment and Plan:  Sarah Reeves is a 60 y.o. female with:  Moderate persistent asthma, uncomplicated  Non-allergic rhinitis   Sarah Reeves seems to be doing well at this time. She is endorsing some increased coughing, but it is difficult to get an idea of  how bad this is. Her history is lacking, to be sure, but she sounds good over the phone and is making full sentences. She does not seem to be ill over the phone, but I did tell her to give [place] a call if she was feeling worse. We are going to refill her medications and hopefully restarting her medications will help her to feel better. She doers report that the Ventolin seems to work better for her, so we are going to send that in.    Diagnostics: None.  Medication List:  Current Outpatient Medications   Medication Sig Dispense Refill  . albuterol (VENTOLIN HFA) 108 (90 Base) MCG/ACT inhaler Inhale 2 puffs into the lungs every 4 (four) hours as needed for wheezing or shortness of breath. 1 each 5  . Ascorbic Acid (VITAMIN C) 1000 MG tablet Take 1,000 mg by mouth daily.    Marland Kitchen aspirin EC 81 MG tablet Take 1 tablet (81 mg total) by mouth daily. 90 tablet 3  . budesonide-formoterol (SYMBICORT) 160-4.5 MCG/ACT inhaler Inhale 2 puffs into the lungs 2 (two) times daily. 10.2 g 5  . cetirizine (ZYRTEC) 10 MG tablet Take 1 tablet (10 mg total) by mouth daily.    . cholecalciferol (VITAMIN D) 1000 UNITS tablet Take 2,000 Units by mouth daily.     . cyclobenzaprine (FLEXERIL) 10 MG tablet Take 10 mg by mouth 3 (three) times daily as needed for muscle spasms.    . Diethylpropion HCl CR 75 MG TB24 diethylpropion ER 75 mg tablet,extended release  TAKE 1 TABLET BY MOUTH EVERY DAY    . DULoxetine (CYMBALTA) 30 MG capsule Take 30 mg by mouth daily.    Marland Kitchen ELMIRON 100 MG capsule Take 100 mg by mouth daily.    Marland Kitchen estradiol (VIVELLE-DOT) 0.075 MG/24HR Place 1 patch onto the skin 2 (two) times a week.    . gabapentin (NEURONTIN) 300 MG capsule Take 300 mg by mouth daily as needed.    Marland Kitchen HYDROcodone-acetaminophen (NORCO) 10-325 MG tablet TK 1 T PO Q 8 H PRN FOR PAIN  0  . levalbuterol (XOPENEX) 1.25 MG/3ML nebulizer solution Take 1.25 mg by nebulization every 4 (four) hours as needed for wheezing. 72 mL 5  . LORazepam (ATIVAN) 2 MG tablet Take 2 mg by mouth 3 (three) times daily as needed.    . meclizine (ANTIVERT) 25 MG tablet meclizine 25 mg tablet  TAKE 1 TABLET BY MOUTH EVERY 6 HOURS AS NEEDED FOR DIZZINESS    . montelukast (SINGULAIR) 10 MG tablet Take 1 tablet (10 mg total) by mouth at bedtime. 30 tablet 5  . progesterone (ENDOMETRIN) 100 MG vaginal insert Place 100 mg vaginally at bedtime.    . promethazine (PHENERGAN) 25 MG tablet promethazine 25 mg tablet  TAKE 1 TABLET BY MOUTH EVERY 4 HOURS AS NEEDED FOR  NAUSEA    . triamterene-hydrochlorothiazide (DYAZIDE) 37.5-25 MG capsule Take 1 capsule by mouth daily.    . valACYclovir (VALTREX) 500 MG tablet Take 500 mg by mouth as needed.    . ziprasidone (GEODON) 40 MG capsule Take 40 mg by mouth 2 (two) times daily with a meal.    . zolpidem (AMBIEN) 10 MG tablet Take 10 mg by mouth at bedtime.    . hydrOXYzine (ATARAX/VISTARIL) 25 MG tablet Take 25 mg by mouth at bedtime. (Patient not taking: Reported on 10/31/2020)    . nitroGLYCERIN (NITROSTAT) 0.4 MG SL tablet Place 1 tablet (0.4 mg total) under the tongue every 5 (five)  minutes as needed for chest pain. 25 tablet 6   No current facility-administered medications for this visit.   Facility-Administered Medications Ordered in Other Visits  Medication Dose Route Frequency Provider Last Rate Last Admin  . chlorhexidine (HIBICLENS) 4 % liquid 1 application  1 application Topical Once Autumn Messing III, MD      . chlorhexidine (HIBICLENS) 4 % liquid 1 application  1 application Topical Once Jovita Kussmaul, MD       Allergies: Allergies  Allergen Reactions  . Oysters [Shellfish Allergy] Other (See Comments)    Unknown reaction; "MD told me I was allergic"  . Zoloft [Sertraline Hcl] Other (See Comments)    REACTION: Nightmares, Grind teeth really bad  . Latex Itching    Itch and blisters from contact  . Amitriptyline     Hot, itching, tongue tingling, breakthrough bleeding.  . Clindamycin Other (See Comments)  . Clindamycin/Lincomycin Hives and Other (See Comments)    Stated that this causes her to get really horse and made her feel really hot  . Other Other (See Comments)  . Penicillins   . Pentosan Polysulfate Sodium Swelling    Throat swelling  . Sulfa Antibiotics Other (See Comments)  . Topamax [Topiramate]     Says it makes her "constipation and she can't urinate normally"  . Uribel [Meth-Hyo-M Bl-Na Phos-Ph Sal] Nausea Only  . Uribel [Urelle] Swelling and Other (See Comments)    Patient  stated this makes her stomach swell and makes her feel si  . Lamotrigine Rash  . Sulfamethoxazole-Trimethoprim Rash    Patient developed feelings of dizziness, rash and itchy hands.  Patient then vomited and had diarrhea.   I reviewed her past medical history, social history, family history, and environmental history and no significant changes have been reported from previous visits.  Review of Systems  Constitutional: Negative for activity change, appetite change, chills and diaphoresis.  HENT: Negative for congestion, postnasal drip, rhinorrhea, sinus pressure and sore throat.   Eyes: Negative for pain, discharge, redness and itching.  Respiratory: Positive for cough and shortness of breath. Negative for wheezing and stridor.   Gastrointestinal: Negative for diarrhea, nausea and vomiting.  Musculoskeletal: Negative for arthralgias, joint swelling and myalgias.  Skin: Negative for rash.  Allergic/Immunologic: Negative for environmental allergies and food allergies.    Objective:  Physical exam not obtained as encounter was done via telephone.   Previous notes and tests were reviewed.  I discussed the assessment and treatment plan with the patient. The patient was provided an opportunity to ask questions and all were answered. The patient agreed with the plan and demonstrated an understanding of the instructions.   The patient was advised to call back or seek an in-person evaluation if the symptoms worsen or if the condition fails to improve as anticipated.  I provided 17 minutes of non-face-to-face time during this encounter.  It was my pleasure to participate in [place] care today. Please feel free to contact me with any questions or concerns.   Sincerely,  Valentina Shaggy, MD

## 2020-11-14 DIAGNOSIS — F339 Major depressive disorder, recurrent, unspecified: Secondary | ICD-10-CM | POA: Diagnosis not present

## 2020-11-14 DIAGNOSIS — F410 Panic disorder [episodic paroxysmal anxiety] without agoraphobia: Secondary | ICD-10-CM | POA: Diagnosis not present

## 2020-11-14 DIAGNOSIS — F909 Attention-deficit hyperactivity disorder, unspecified type: Secondary | ICD-10-CM | POA: Diagnosis not present

## 2020-11-14 DIAGNOSIS — F411 Generalized anxiety disorder: Secondary | ICD-10-CM | POA: Diagnosis not present

## 2020-11-15 DIAGNOSIS — F339 Major depressive disorder, recurrent, unspecified: Secondary | ICD-10-CM | POA: Diagnosis not present

## 2020-11-15 DIAGNOSIS — F411 Generalized anxiety disorder: Secondary | ICD-10-CM | POA: Diagnosis not present

## 2020-11-15 DIAGNOSIS — F410 Panic disorder [episodic paroxysmal anxiety] without agoraphobia: Secondary | ICD-10-CM | POA: Diagnosis not present

## 2020-11-16 DIAGNOSIS — Z6828 Body mass index (BMI) 28.0-28.9, adult: Secondary | ICD-10-CM | POA: Diagnosis not present

## 2020-11-16 DIAGNOSIS — Z01419 Encounter for gynecological examination (general) (routine) without abnormal findings: Secondary | ICD-10-CM | POA: Diagnosis not present

## 2020-11-16 DIAGNOSIS — R87612 Low grade squamous intraepithelial lesion on cytologic smear of cervix (LGSIL): Secondary | ICD-10-CM | POA: Insufficient documentation

## 2020-11-16 DIAGNOSIS — Z1231 Encounter for screening mammogram for malignant neoplasm of breast: Secondary | ICD-10-CM | POA: Diagnosis not present

## 2020-11-16 DIAGNOSIS — Z13820 Encounter for screening for osteoporosis: Secondary | ICD-10-CM | POA: Diagnosis not present

## 2020-11-20 DIAGNOSIS — M8580 Other specified disorders of bone density and structure, unspecified site: Secondary | ICD-10-CM | POA: Insufficient documentation

## 2020-11-23 DIAGNOSIS — F410 Panic disorder [episodic paroxysmal anxiety] without agoraphobia: Secondary | ICD-10-CM | POA: Diagnosis not present

## 2020-11-23 DIAGNOSIS — F4010 Social phobia, unspecified: Secondary | ICD-10-CM | POA: Diagnosis not present

## 2020-11-23 DIAGNOSIS — F339 Major depressive disorder, recurrent, unspecified: Secondary | ICD-10-CM | POA: Diagnosis not present

## 2020-11-23 DIAGNOSIS — F411 Generalized anxiety disorder: Secondary | ICD-10-CM | POA: Diagnosis not present

## 2020-11-28 ENCOUNTER — Ambulatory Visit: Payer: BC Managed Care – PPO | Admitting: Allergy & Immunology

## 2020-12-13 ENCOUNTER — Ambulatory Visit: Payer: BC Managed Care – PPO | Admitting: Family Medicine

## 2020-12-14 DIAGNOSIS — F339 Major depressive disorder, recurrent, unspecified: Secondary | ICD-10-CM | POA: Diagnosis not present

## 2020-12-14 DIAGNOSIS — F411 Generalized anxiety disorder: Secondary | ICD-10-CM | POA: Diagnosis not present

## 2020-12-28 DIAGNOSIS — F411 Generalized anxiety disorder: Secondary | ICD-10-CM | POA: Diagnosis not present

## 2020-12-28 DIAGNOSIS — F339 Major depressive disorder, recurrent, unspecified: Secondary | ICD-10-CM | POA: Diagnosis not present

## 2021-01-04 DIAGNOSIS — R87612 Low grade squamous intraepithelial lesion on cytologic smear of cervix (LGSIL): Secondary | ICD-10-CM | POA: Diagnosis not present

## 2021-01-04 DIAGNOSIS — R87619 Unspecified abnormal cytological findings in specimens from cervix uteri: Secondary | ICD-10-CM | POA: Diagnosis not present

## 2021-01-18 DIAGNOSIS — F411 Generalized anxiety disorder: Secondary | ICD-10-CM | POA: Diagnosis not present

## 2021-01-18 DIAGNOSIS — F339 Major depressive disorder, recurrent, unspecified: Secondary | ICD-10-CM | POA: Diagnosis not present

## 2021-01-24 DIAGNOSIS — R87612 Low grade squamous intraepithelial lesion on cytologic smear of cervix (LGSIL): Secondary | ICD-10-CM | POA: Diagnosis not present

## 2021-02-06 DIAGNOSIS — F909 Attention-deficit hyperactivity disorder, unspecified type: Secondary | ICD-10-CM | POA: Diagnosis not present

## 2021-02-06 DIAGNOSIS — F410 Panic disorder [episodic paroxysmal anxiety] without agoraphobia: Secondary | ICD-10-CM | POA: Diagnosis not present

## 2021-02-06 DIAGNOSIS — F411 Generalized anxiety disorder: Secondary | ICD-10-CM | POA: Diagnosis not present

## 2021-02-06 DIAGNOSIS — F339 Major depressive disorder, recurrent, unspecified: Secondary | ICD-10-CM | POA: Diagnosis not present

## 2021-02-07 DIAGNOSIS — F411 Generalized anxiety disorder: Secondary | ICD-10-CM | POA: Diagnosis not present

## 2021-02-07 DIAGNOSIS — F339 Major depressive disorder, recurrent, unspecified: Secondary | ICD-10-CM | POA: Diagnosis not present

## 2021-02-21 ENCOUNTER — Ambulatory Visit: Payer: BC Managed Care – PPO | Admitting: Cardiology

## 2021-02-27 ENCOUNTER — Telehealth: Payer: Self-pay | Admitting: Cardiology

## 2021-02-27 DIAGNOSIS — F4010 Social phobia, unspecified: Secondary | ICD-10-CM | POA: Diagnosis not present

## 2021-02-27 DIAGNOSIS — F411 Generalized anxiety disorder: Secondary | ICD-10-CM | POA: Diagnosis not present

## 2021-02-27 DIAGNOSIS — F339 Major depressive disorder, recurrent, unspecified: Secondary | ICD-10-CM | POA: Diagnosis not present

## 2021-02-27 MED ORDER — NITROGLYCERIN 0.4 MG SL SUBL: 0.4000 mg | SUBLINGUAL_TABLET | SUBLINGUAL | 1 refills | Status: DC | PRN

## 2021-02-27 NOTE — Telephone Encounter (Signed)
*  STAT* If patient is at the pharmacy, call can be transferred to refill team.   1. Which medications need to be refilled? (please list name of each medication and dose if known) nitroGLYCERIN (NITROSTAT) 0.4 MG SL tablet  2. Which pharmacy/location (including street and city if local pharmacy) is medication to be sent to? [facility], [place] [address] [place]  3. Do they need a 30 day or 90 day supply? 90 day   Patient is out of medication

## 2021-03-09 ENCOUNTER — Emergency Department (HOSPITAL_COMMUNITY): Payer: BC Managed Care – PPO

## 2021-03-09 ENCOUNTER — Other Ambulatory Visit: Payer: Self-pay

## 2021-03-09 ENCOUNTER — Observation Stay (HOSPITAL_COMMUNITY)
Admission: EM | Admit: 2021-03-09 | Discharge: 2021-03-11 | Disposition: A | Discharge status: Home / Self Care | Payer: BC Managed Care – PPO | Attending: Internal Medicine | Admitting: Internal Medicine

## 2021-03-09 ENCOUNTER — Encounter (HOSPITAL_COMMUNITY): Payer: Self-pay | Admitting: *Deleted

## 2021-03-09 DIAGNOSIS — F333 Major depressive disorder, recurrent, severe with psychotic symptoms: Secondary | ICD-10-CM | POA: Diagnosis present

## 2021-03-09 DIAGNOSIS — J9601 Acute respiratory failure with hypoxia: Secondary | ICD-10-CM | POA: Diagnosis not present

## 2021-03-09 DIAGNOSIS — J189 Pneumonia, unspecified organism: Secondary | ICD-10-CM | POA: Diagnosis not present

## 2021-03-09 DIAGNOSIS — Z79899 Other long term (current) drug therapy: Secondary | ICD-10-CM | POA: Insufficient documentation

## 2021-03-09 DIAGNOSIS — J45901 Unspecified asthma with (acute) exacerbation: Secondary | ICD-10-CM | POA: Diagnosis not present

## 2021-03-09 DIAGNOSIS — J4541 Moderate persistent asthma with (acute) exacerbation: Secondary | ICD-10-CM | POA: Diagnosis not present

## 2021-03-09 DIAGNOSIS — F17210 Nicotine dependence, cigarettes, uncomplicated: Secondary | ICD-10-CM | POA: Insufficient documentation

## 2021-03-09 DIAGNOSIS — Z20822 Contact with and (suspected) exposure to covid-19: Secondary | ICD-10-CM | POA: Diagnosis not present

## 2021-03-09 DIAGNOSIS — R062 Wheezing: Secondary | ICD-10-CM | POA: Diagnosis not present

## 2021-03-09 DIAGNOSIS — F411 Generalized anxiety disorder: Secondary | ICD-10-CM

## 2021-03-09 DIAGNOSIS — R0902 Hypoxemia: Secondary | ICD-10-CM | POA: Diagnosis not present

## 2021-03-09 DIAGNOSIS — Z91040 Latex allergy status: Secondary | ICD-10-CM | POA: Diagnosis not present

## 2021-03-09 DIAGNOSIS — R0602 Shortness of breath: Secondary | ICD-10-CM | POA: Diagnosis not present

## 2021-03-09 LAB — RESP PANEL BY RT-PCR (FLU A&B, COVID) ARPGX2
Influenza A by PCR: NEGATIVE
Influenza B by PCR: NEGATIVE
SARS Coronavirus 2 by RT PCR: NEGATIVE

## 2021-03-09 LAB — CBC WITH DIFFERENTIAL/PLATELET
Abs Immature Granulocytes: 0.04 10*3/uL (ref 0.00–0.07)
Basophils Absolute: 0 10*3/uL (ref 0.0–0.1)
Basophils Relative: 1 %
Eosinophils Absolute: 0 10*3/uL (ref 0.0–0.5)
Eosinophils Relative: 0 %
HCT: 43.1 % (ref 36.0–46.0)
Hemoglobin: 13.9 g/dL (ref 12.0–15.0)
Immature Granulocytes: 1 %
Lymphocytes Relative: 9 %
Lymphs Abs: 0.8 10*3/uL (ref 0.7–4.0)
MCH: 30 pg (ref 26.0–34.0)
MCHC: 32.3 g/dL (ref 30.0–36.0)
MCV: 92.9 fL (ref 80.0–100.0)
Monocytes Absolute: 0.1 10*3/uL (ref 0.1–1.0)
Monocytes Relative: 2 %
Neutro Abs: 7.3 10*3/uL (ref 1.7–7.7)
Neutrophils Relative %: 87 %
Platelets: 230 10*3/uL (ref 150–400)
RBC: 4.64 MIL/uL (ref 3.87–5.11)
RDW: 13.6 % (ref 11.5–15.5)
WBC: 8.2 10*3/uL (ref 4.0–10.5)
nRBC: 0 % (ref 0.0–0.2)

## 2021-03-09 LAB — COMPREHENSIVE METABOLIC PANEL
ALT: 14 U/L (ref 0–44)
AST: 18 U/L (ref 15–41)
Albumin: 4.1 g/dL (ref 3.5–5.0)
Alkaline Phosphatase: 48 U/L (ref 38–126)
Anion gap: 9 (ref 5–15)
BUN: 9 mg/dL (ref 6–20)
CO2: 26 mmol/L (ref 22–32)
Calcium: 9.5 mg/dL (ref 8.9–10.3)
Chloride: 104 mmol/L (ref 98–111)
Creatinine, Ser: 0.56 mg/dL (ref 0.44–1.00)
GFR, Estimated: >60 mL/min (ref >60)
Glucose, Bld: 147 mg/dL — ABNORMAL HIGH (ref 70–99)
Potassium: 4.2 mmol/L (ref 3.5–5.1)
Sodium: 139 mmol/L (ref 135–145)
Total Bilirubin: 0.3 mg/dL (ref 0.3–1.2)
Total Protein: 7 g/dL (ref 6.5–8.1)

## 2021-03-09 LAB — BRAIN NATRIURETIC PEPTIDE: B Natriuretic Peptide: 66 pg/mL (ref 0.0–100.0)

## 2021-03-09 LAB — HIV ANTIBODY (ROUTINE TESTING W REFLEX): HIV Screen 4th Generation wRfx: NONREACTIVE

## 2021-03-09 LAB — URINALYSIS, ROUTINE W REFLEX MICROSCOPIC
Bilirubin Urine: NEGATIVE
Glucose, UA: NEGATIVE mg/dL
Hgb urine dipstick: NEGATIVE
Ketones, ur: NEGATIVE mg/dL
Leukocytes,Ua: NEGATIVE
Nitrite: NEGATIVE
Protein, ur: NEGATIVE mg/dL
Specific Gravity, Urine: 1.003 — ABNORMAL LOW (ref 1.005–1.030)
pH: 7 (ref 5.0–8.0)

## 2021-03-09 LAB — TROPONIN I (HIGH SENSITIVITY)
Troponin I (High Sensitivity): <2 ng/L
Troponin I (High Sensitivity): 2 ng/L (ref <18)

## 2021-03-09 LAB — BLOOD GAS, VENOUS
Acid-Base Excess: 2 mmol/L (ref 0.0–2.0)
Bicarbonate: 26.3 mmol/L (ref 20.0–28.0)
O2 Saturation: 89.8 %
Patient temperature: 98.6
pCO2, Ven: 41.9 mmHg — ABNORMAL LOW (ref 44.0–60.0)
pH, Ven: 7.414 (ref 7.250–7.430)
pO2, Ven: 60.3 mmHg — ABNORMAL HIGH (ref 32.0–45.0)

## 2021-03-09 LAB — LACTIC ACID, PLASMA
Lactic Acid, Venous: 1 mmol/L (ref 0.5–1.9)
Lactic Acid, Venous: 1.5 mmol/L (ref 0.5–1.9)

## 2021-03-09 LAB — LIPASE, BLOOD: Lipase: 20 U/L (ref 11–51)

## 2021-03-09 MED ORDER — ACETAMINOPHEN 325 MG PO TABS: 650.0000 mg | ORAL_TABLET | Freq: Four times a day (QID) | ORAL | Status: DC | PRN

## 2021-03-09 MED ORDER — FLUTICASONE FUROATE-VILANTEROL 200-25 MCG/INH IN AEPB
1.0000 | INHALATION_SPRAY | Freq: Every day | RESPIRATORY_TRACT | Status: DC
  Administered 2021-03-10 – 2021-03-11 (×2): 1 via RESPIRATORY_TRACT
  Filled 2021-03-09: qty 28

## 2021-03-09 MED ORDER — ASPIRIN EC 81 MG PO TBEC
81.0000 mg | DELAYED_RELEASE_TABLET | Freq: Every day | ORAL | Status: DC
  Administered 2021-03-09 – 2021-03-11 (×3): 81 mg via ORAL
  Filled 2021-03-09 (×3): qty 1

## 2021-03-09 MED ORDER — ACETAMINOPHEN 650 MG RE SUPP: 650.0000 mg | Freq: Four times a day (QID) | RECTAL | Status: DC | PRN

## 2021-03-09 MED ORDER — MONTELUKAST SODIUM 10 MG PO TABS
10.0000 mg | ORAL_TABLET | Freq: Every day | ORAL | Status: DC
  Administered 2021-03-09 – 2021-03-10 (×2): 10 mg via ORAL
  Filled 2021-03-09 (×2): qty 1

## 2021-03-09 MED ORDER — IPRATROPIUM-ALBUTEROL 0.5-2.5 (3) MG/3ML IN SOLN
3.0000 mL | Freq: Three times a day (TID) | RESPIRATORY_TRACT | Status: DC
  Administered 2021-03-10 – 2021-03-11 (×5): 3 mL via RESPIRATORY_TRACT
  Filled 2021-03-09 (×4): qty 3

## 2021-03-09 MED ORDER — ZIPRASIDONE HCL 20 MG PO CAPS
40.0000 mg | ORAL_CAPSULE | Freq: Two times a day (BID) | ORAL | Status: DC
  Administered 2021-03-10 – 2021-03-11 (×2): 40 mg via ORAL
  Filled 2021-03-09 (×5): qty 2

## 2021-03-09 MED ORDER — AZITHROMYCIN 250 MG PO TABS
500.0000 mg | ORAL_TABLET | Freq: Once | ORAL | Status: AC
  Administered 2021-03-09: 500 mg via ORAL
  Filled 2021-03-09: qty 2

## 2021-03-09 MED ORDER — DULOXETINE HCL 30 MG PO CPEP
30.0000 mg | ORAL_CAPSULE | Freq: Every day | ORAL | Status: DC
  Administered 2021-03-10 – 2021-03-11 (×2): 30 mg via ORAL
  Filled 2021-03-09 (×2): qty 1

## 2021-03-09 MED ORDER — NICOTINE 14 MG/24HR TD PT24
14.0000 mg | MEDICATED_PATCH | Freq: Every day | TRANSDERMAL | Status: DC
  Filled 2021-03-09 (×3): qty 1

## 2021-03-09 MED ORDER — PREDNISONE 20 MG PO TABS
40.0000 mg | ORAL_TABLET | Freq: Every day | ORAL | Status: DC
  Administered 2021-03-11: 40 mg via ORAL
  Filled 2021-03-09: qty 2

## 2021-03-09 MED ORDER — CYCLOBENZAPRINE HCL 10 MG PO TABS
10.0000 mg | ORAL_TABLET | Freq: Three times a day (TID) | ORAL | Status: DC | PRN
  Administered 2021-03-10 – 2021-03-11 (×3): 10 mg via ORAL
  Filled 2021-03-09 (×3): qty 1

## 2021-03-09 MED ORDER — METHYLPREDNISOLONE SODIUM SUCC 40 MG IJ SOLR
40.0000 mg | Freq: Four times a day (QID) | INTRAMUSCULAR | Status: AC
  Administered 2021-03-09 – 2021-03-10 (×4): 40 mg via INTRAVENOUS
  Filled 2021-03-09 (×4): qty 1

## 2021-03-09 MED ORDER — GUAIFENESIN-DM 100-10 MG/5ML PO SYRP
5.0000 mL | ORAL_SOLUTION | ORAL | Status: DC | PRN
  Administered 2021-03-09: 5 mL via ORAL
  Filled 2021-03-09 (×2): qty 5

## 2021-03-09 MED ORDER — LACTATED RINGERS IV SOLN: INTRAVENOUS | Status: AC

## 2021-03-09 MED ORDER — POLYETHYLENE GLYCOL 3350 17 G PO PACK: 17.0000 g | PACK | Freq: Every day | ORAL | Status: DC | PRN

## 2021-03-09 MED ORDER — HYDROCODONE-ACETAMINOPHEN 5-325 MG PO TABS
1.0000 | ORAL_TABLET | Freq: Four times a day (QID) | ORAL | Status: DC | PRN
  Administered 2021-03-09: 2 via ORAL
  Administered 2021-03-10: 1 via ORAL
  Administered 2021-03-10 – 2021-03-11 (×5): 2 via ORAL
  Filled 2021-03-09 (×7): qty 2

## 2021-03-09 MED ORDER — LORAZEPAM 1 MG PO TABS
2.0000 mg | ORAL_TABLET | Freq: Three times a day (TID) | ORAL | Status: DC | PRN
  Administered 2021-03-09 – 2021-03-11 (×5): 2 mg via ORAL
  Filled 2021-03-09 (×5): qty 2

## 2021-03-09 MED ORDER — TRIAMTERENE-HCTZ 37.5-25 MG PO TABS
1.0000 | ORAL_TABLET | Freq: Every day | ORAL | Status: DC
  Administered 2021-03-09 – 2021-03-10 (×2): 1 via ORAL
  Filled 2021-03-09 (×3): qty 1

## 2021-03-09 MED ORDER — ALBUTEROL SULFATE (2.5 MG/3ML) 0.083% IN NEBU
2.5000 mg | INHALATION_SOLUTION | RESPIRATORY_TRACT | Status: DC | PRN
  Administered 2021-03-10: 2.5 mg via RESPIRATORY_TRACT
  Filled 2021-03-09: qty 3

## 2021-03-09 MED ORDER — IPRATROPIUM-ALBUTEROL 0.5-2.5 (3) MG/3ML IN SOLN
3.0000 mL | Freq: Four times a day (QID) | RESPIRATORY_TRACT | Status: DC
  Administered 2021-03-09: 3 mL via RESPIRATORY_TRACT
  Filled 2021-03-09: qty 3

## 2021-03-09 MED ORDER — IPRATROPIUM-ALBUTEROL 0.5-2.5 (3) MG/3ML IN SOLN
3.0000 mL | Freq: Once | RESPIRATORY_TRACT | Status: AC
  Administered 2021-03-09: 3 mL via RESPIRATORY_TRACT
  Filled 2021-03-09: qty 3

## 2021-03-09 NOTE — H&P (Signed)
History and Physical        Hospital Admission Note Date: 03/09/2021  Patient name: Sarah Reeves Medical record number: [id number] Date of birth: 11-15-1960 Age: 60 y.o. Gender: female  PCP: Dian Queen, MD   Chief Complaint    Chief Complaint  Patient presents with  . Shortness of Breath      HPI:   This is a 60 year old female with past medical history of fibromyalgia, tobacco use, HCV s/p treatment, GERD, hyperlipidemia, depression, anxiety, angioedema, asthma who presented to the ED from her PCP office with shortness of breath, nonproductive cough and hypoxia at the office to 89%.  She was diagnosed with pneumonia last week and given a Z-Pak and has nearly completed it, today is her last dose.  Has been using her home inhalers without much improvement and not much improvement with the azithromycin.  She was treated with DuoNeb and Decadron IM in the office prior to arrival and placed on 2 L nasal cannula.  Admits to wheezing.  Does feel a bit better after getting the steroid and neb treatments earlier today.  Smokes 1/2 ppd cigarettes.  No known fevers or sick contacts.  No lower extremity swelling, chest pain or any other complaints.   ED Course: Afebrile, hemodynamically stable, placed on 2 L/min. Notable Labs: COVID-19 and flu negative and overall unremarkable. Notable Imaging: CXR-unremarkable. Patient received DuoNeb x1.    Vitals:   03/09/21 1400 03/09/21 1500  BP: 135/76 (!) 141/75  Pulse: 89 91  Resp: (!) 24 (!) 27  Temp:    SpO2: 92% 94%     Review of Systems:  Review of Systems  All other systems reviewed and are negative.   Medical/Social/Family History   Past Medical History: Past Medical History:  Diagnosis Date  . Angio-edema   . Anxiety   . Asthma   . Complication of anesthesia    takes alot to sedated  . Depression   . Fibromyalgia    . GERD (gastroesophageal reflux disease)   . Hepatitis C    (Dr. Earlean Shawl) Treated with Harvoni March-May 2016  . Herpes simplex   . Hyperlipemia   . Insomnia   . Mental disorder   . Neuropathy   . PONV (postoperative nausea and vomiting)   . Urticaria     Past Surgical History:  Procedure Laterality Date  . BREAST ENHANCEMENT SURGERY    . BREAST LUMPECTOMY WITH RADIOACTIVE SEED LOCALIZATION Left 12/26/2014   Procedure: LEFT BREAST LUMPECTOMY WITH RADIOACTIVE SEED LOCALIZATION;  Surgeon: Autumn Messing III, MD;  Location: [facility];  Service: General;  Laterality: Left;  . BREAST REDUCTION SURGERY    . COLONOSCOPY    . EXPLORATORY LAPAROTOMY    . NM MYOVIEW LTD  07/07/15   Normal Myocardial Perfusion Scan. Low risk lexiscan nuclear study with minimal insignificant breast attenuation and normal myocardial perfusion and function; EF 53% without wall motion abnormalities and normal systolic thickening  . TEMPOROMANDIBULAR JOINT SURGERY    . TUBAL LIGATION    . UPPER GI ENDOSCOPY     x5    Medications: Prior to Admission medications   Medication Sig Start Date End Date Taking? Authorizing Provider  albuterol (VENTOLIN  HFA) 108 (90 Base) MCG/ACT inhaler Inhale 2 puffs into the lungs every 4 (four) hours as needed for wheezing or shortness of breath. 10/31/20   Valentina Shaggy, MD  Ascorbic Acid (VITAMIN C) 1000 MG tablet Take 1,000 mg by mouth daily.    [provider]  aspirin EC 81 MG tablet Take 1 tablet (81 mg total) by mouth daily. 08/04/18   Leonie Man, MD  budesonide-formoterol [facility]) 160-4.5 MCG/ACT inhaler Inhale 2 puffs into the lungs 2 (two) times daily. 10/31/20   Valentina Shaggy, MD  cetirizine (ZYRTEC) 10 MG tablet Take 1 tablet (10 mg total) by mouth daily. 06/16/13   Ruben Im, PA-C  cholecalciferol (VITAMIN D) 1000 UNITS tablet Take 2,000 Units by mouth daily.     [provider]  cyclobenzaprine (FLEXERIL) 10 MG  tablet Take 10 mg by mouth 3 (three) times daily as needed for muscle spasms.    [provider]  Diethylpropion HCl CR 75 MG TB24 diethylpropion ER 75 mg tablet,extended release  TAKE 1 TABLET BY MOUTH EVERY DAY    [provider]  DULoxetine (CYMBALTA) 30 MG capsule Take 30 mg by mouth daily. 11/29/19   [provider]  ELMIRON 100 MG capsule Take 100 mg by mouth daily. 11/17/19   [provider]  estradiol (VIVELLE-DOT) 0.075 MG/24HR Place 1 patch onto the skin 2 (two) times a week.    [provider]  gabapentin (NEURONTIN) 300 MG capsule Take 300 mg by mouth daily as needed. 11/29/19   [provider]  HYDROcodone-acetaminophen (NORCO) 10-325 MG tablet TK 1 T PO Q 8 H PRN FOR PAIN 07/17/18   [provider]  hydrOXYzine (ATARAX/VISTARIL) 25 MG tablet Take 25 mg by mouth at bedtime. Patient not taking: Reported on 10/31/2020 08/15/19   [provider]  levalbuterol Penne Lash) 1.25 MG/3ML nebulizer solution Take 1.25 mg by nebulization every 4 (four) hours as needed for wheezing. 06/13/20   Valentina Shaggy, MD  LORazepam (ATIVAN) 2 MG tablet Take 2 mg by mouth 3 (three) times daily as needed. 12/17/19   [provider]  meclizine (ANTIVERT) 25 MG tablet meclizine 25 mg tablet  TAKE 1 TABLET BY MOUTH EVERY 6 HOURS AS NEEDED FOR DIZZINESS    [provider]  montelukast (SINGULAIR) 10 MG tablet Take 1 tablet (10 mg total) by mouth at bedtime. 10/31/20   Valentina Shaggy, MD  nitroGLYCERIN (NITROSTAT) 0.4 MG SL tablet Place 1 tablet (0.4 mg total) under the tongue every 5 (five) minutes as needed for chest pain. 02/27/21 05/28/21  Leonie Man, MD  predniSONE (DELTASONE) 10 MG tablet Take two tablets (20mg ) twice daily for three days, then one tablet (10mg ) twice daily for three days, then STOP. 10/31/20   Valentina Shaggy, MD  progesterone (ENDOMETRIN) 100 MG vaginal insert Place 100 mg vaginally at  bedtime.    [provider]  promethazine (PHENERGAN) 25 MG tablet promethazine 25 mg tablet  TAKE 1 TABLET BY MOUTH EVERY 4 HOURS AS NEEDED FOR NAUSEA    [provider]  triamterene-hydrochlorothiazide (DYAZIDE) 37.5-25 MG capsule Take 1 capsule by mouth daily. 11/16/19   [provider]  valACYclovir (VALTREX) 500 MG tablet Take 500 mg by mouth as needed. 11/16/19   [provider]  ziprasidone (GEODON) 40 MG capsule Take 40 mg by mouth 2 (two) times daily with a meal.    [provider]  zolpidem (AMBIEN) 10 MG tablet Take  10 mg by mouth at bedtime. 12/09/19   [provider]    Allergies:   Allergies  Allergen Reactions  . Oysters [Shellfish Allergy] Other (See Comments)    Unknown reaction; "MD told me I was allergic"  . Zoloft [Sertraline Hcl] Other (See Comments)    REACTION: Nightmares, Grind teeth really bad  . Latex Itching    Itch and blisters from contact  . Amitriptyline     Hot, itching, tongue tingling, breakthrough bleeding.  . Clindamycin Other (See Comments)  . Clindamycin/Lincomycin Hives and Other (See Comments)    Stated that this causes her to get really horse and made her feel really hot  . Other Other (See Comments)  . Penicillins   . Pentosan Polysulfate Sodium Swelling    Throat swelling  . Sulfa Antibiotics Other (See Comments)  . Topamax [Topiramate]     Says it makes her "constipation and she can't urinate normally"  . Uribel [Meth-Hyo-M Bl-Na Phos-Ph Sal] Nausea Only  . Uribel [Urelle] Swelling and Other (See Comments)    Patient stated this makes her stomach swell and makes her feel si  . Lamotrigine Rash  . Sulfamethoxazole-Trimethoprim Rash    Patient developed feelings of dizziness, rash and itchy hands.  Patient then vomited and had diarrhea.    Social History:  reports that she has been smoking cigarettes. She has been smoking about 0.50 packs per day. She has never used smokeless tobacco.  She reports that she does not drink alcohol and does not use drugs.  Family History: Family History  Problem Relation Age of Onset  . Diabetes Mother   . Lung disease Mother   . Heart disease Mother   . Breast cancer Mother   . Heart disease Father   . Colon cancer Father   . Hypothyroidism Brother   . Alzheimer's disease Maternal Aunt   . Skin cancer Maternal Aunt   . Alzheimer's disease Maternal Grandmother      Objective   Physical Exam: Blood pressure (!) 141/75, pulse 91, temperature 97.8 F (36.6 C), temperature source Oral, resp. rate (!) 27, SpO2 94 %.  Physical Exam Vitals and nursing note reviewed.  Constitutional:      Appearance: Normal appearance.  HENT:     Head: Normocephalic and atraumatic.  Eyes:     Conjunctiva/sclera: Conjunctivae normal.  Cardiovascular:     Rate and Rhythm: Normal rate and regular rhythm.  Pulmonary:     Effort: Pulmonary effort is normal.     Breath sounds: Wheezing present.     Comments: SpO2 in the 90s with conversation at bedside despite 2 L/min Abdominal:     General: Abdomen is flat.     Palpations: Abdomen is soft.  Musculoskeletal:        General: No swelling or tenderness.  Skin:    Coloration: Skin is not jaundiced or pale.  Neurological:     Mental Status: She is alert. Mental status is at baseline.  Psychiatric:        Mood and Affect: Mood is anxious.        Behavior: Behavior is agitated.     LABS on Admission: I have personally reviewed all the labs and imaging below    Basic Metabolic Panel: Recent Labs  Lab 03/09/21 1300  NA 139  K 4.2  CL 104  CO2 26  GLUCOSE 147*  BUN 9  CREATININE 0.56  CALCIUM 9.5   Liver Function Tests: Recent Labs  Lab 03/09/21 1300  AST 18  ALT 14  ALKPHOS 48  BILITOT 0.3  PROT 7.0  ALBUMIN 4.1   Recent Labs  Lab 03/09/21 1300  LIPASE 20   No results for input(s): AMMONIA in the last 168 hours. CBC: Recent Labs  Lab 03/09/21 1300  WBC 8.2  NEUTROABS  7.3  HGB 13.9  HCT 43.1  MCV 92.9  PLT 230   Cardiac Enzymes: No results for input(s): CKTOTAL, CKMB, CKMBINDEX, TROPONINI in the last 168 hours. BNP: Invalid input(s): POCBNP CBG: No results for input(s): GLUCAP in the last 168 hours.  Radiological Exams on Admission:  DG Chest Port 1 View  Result Date: 03/09/2021 CLINICAL DATA:  Shortness of breath EXAM: PORTABLE CHEST 1 VIEW COMPARISON:  11/18/2019 FINDINGS: The heart size and mediastinal contours are within normal limits. Both lungs are clear. The visualized skeletal structures are unremarkable. IMPRESSION: No active disease. Electronically Signed   By: Donavan Foil M.D.   On: 03/09/2021 15:30      EKG: normal EKG, normal sinus rhythm   A & P   Principal Problem:   Asthma exacerbation Active Problems:   Generalized anxiety disorder   MDD (major depressive disorder), recurrent, severe, with psychosis ([place])   Acute respiratory failure with hypoxia ([place])   1. Acute hypoxic respiratory failure secondary to asthma exacerbation, likely viral in nature a. Satting 90% with conversation despite 2 L/min O2 and 89% in her PCP office today b. Azithromycin x1 to complete her outpatient therapy today but no further antibiotics as she is afebrile and without leukocytosis c. Continue home Singulair and change home Symbicort to [facility] while inpatient d. Scheduled duo nebs and as needed albuterol e. Continue steroids  2. Anxiety/depression/fibromyalgia a. Continue home medications  3. Tobacco abuse a. Counseled on cessation and patient wishes to quit at discharge b. Nicotine patch    DVT prophylaxis: SCDs   Code Status: Full Code  Diet: Heart healthy Family Communication: Admission, patients condition and plan of care including tests being ordered have been discussed with the patient who indicates understanding and agrees with the plan and Code Status.  Disposition Plan: The appropriate patient status for this patient is  OBSERVATION. Observation status is judged to be reasonable and necessary in order to provide the required intensity of service to ensure the patient's safety. The patient's presenting symptoms, physical exam findings, and initial radiographic and laboratory data in the context of their medical condition is felt to place them at decreased risk for further clinical deterioration. Furthermore, it is anticipated that the patient will be medically stable for discharge from the hospital within 2 midnights of admission. The following factors support the patient status of observation.   " The patient's presenting symptoms include shortness of breath, hypoxia. " The physical exam findings include wheezing. " The initial radiographic and laboratory data are unremarkable.      Consultants  . None  Procedures  . None  Time Spent on Admission: 60 minutes    Harold Hedge, DO [facility]  03/09/2021, 4:51 PM

## 2021-03-09 NOTE — ED Notes (Signed)
Transport contacted to transfer to room

## 2021-03-09 NOTE — ED Provider Notes (Signed)
[facility] DEPT Provider Note   CSN: [id number] Arrival date & time: 03/09/21  1218     History Chief Complaint  Patient presents with  . Shortness of Breath    Sarah Reeves is a 60 y.o. female.  HPI Patient has worsening shortness of breath.  She was diagnosed with pneumonia last week and put on a Z-Pak.  She reports she is continue to get increasing shortness of breath and went to a follow-up appointment today where her oxygen saturation was at 89%.  She was treated with a DuoNeb and Decadron 12 mg IM administered.  Patient reports she feels somewhat better.  She is also placed on 2 L nasal cannula bring her saturation to 95%.  Patient reports he has had some night sweats.  He has not had a fever that she is documented.  No lower extremity swelling or calf pain.    Past Medical History:  Diagnosis Date  . Angio-edema   . Anxiety   . Asthma   . Complication of anesthesia    takes alot to sedated  . Depression   . Fibromyalgia   . GERD (gastroesophageal reflux disease)   . Hepatitis C    (Dr. Earlean Shawl) Treated with Harvoni March-May 2016  . Herpes simplex   . Hyperlipemia   . Insomnia   . Mental disorder   . Neuropathy   . PONV (postoperative nausea and vomiting)   . Urticaria     Patient Active Problem List   Diagnosis Date Noted  . 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 Post traumatic stress disorder (PTSD) 06/26/2018  . Generalized anxiety disorder 06/26/2018  . MDD (major depressive disorder), recurrent, severe, with psychosis ([place]) 06/26/2018  . Balance problem 01/28/2018  . Tremor 01/28/2018  . Tobacco abuse counseling 07/25/2015  . Chest pain at rest 06/26/2015  . Dizziness of unknown cause 06/26/2015  . Hyperglycemia 06/26/2015  . Paresthesia 06/20/2015  . Suicidal ideation 06/06/2013  . Major depressive disorder, recurrent, severe with psychotic features ([place]) 06/06/2013  . Alcohol addiction ([place]) 04/30/2013  .  HEPATITIS C 06/30/2007  . ALLERGIC RHINITIS 06/30/2007  . ASTHMA 06/30/2007  . GERD 06/30/2007    Past Surgical History:  Procedure Laterality Date  . BREAST ENHANCEMENT SURGERY    . BREAST LUMPECTOMY WITH RADIOACTIVE SEED LOCALIZATION Left 12/26/2014   Procedure: LEFT BREAST LUMPECTOMY WITH RADIOACTIVE SEED LOCALIZATION;  Surgeon: Autumn Messing III, MD;  Location: [place];  Service: General;  Laterality: Left;  . BREAST REDUCTION SURGERY    . COLONOSCOPY    . EXPLORATORY LAPAROTOMY    . NM MYOVIEW LTD  07/07/15   Normal Myocardial Perfusion Scan. Low risk lexiscan nuclear study with minimal insignificant breast attenuation and normal myocardial perfusion and function; EF 53% without wall motion abnormalities and normal systolic thickening  . TEMPOROMANDIBULAR JOINT SURGERY    . TUBAL LIGATION    . UPPER GI ENDOSCOPY     x5     OB History    Gravida      Para      Term      Preterm      AB      Living  0     SAB      IAB      Ectopic      Multiple      Live Births              Family History  Problem Relation Age of Onset  . Diabetes Mother   . Lung disease Mother   . Heart disease Mother   . Breast cancer Mother   . Heart disease Father   . Colon cancer Father   . Hypothyroidism Brother   . Alzheimer's disease Maternal Aunt   . Skin cancer Maternal Aunt   . Alzheimer's disease Maternal Grandmother     Social History   Tobacco Use  . Smoking status: Current Every Day Smoker    Packs/day: 0.50    Types: Cigarettes  . Smokeless tobacco: Never Used  Vaping Use  . Vaping Use: Never used  Substance Use Topics  . Alcohol use: No  . Drug use: No    Comment: did have hx drug abuse as teen    Home Medications Prior to Admission medications   Medication Sig Start Date End Date Taking? Authorizing Provider  albuterol (VENTOLIN HFA) 108 (90 Base) MCG/ACT inhaler Inhale 2 puffs into the lungs every 4 (four) hours as needed for wheezing  or shortness of breath. 10/31/20   Valentina Shaggy, MD  Ascorbic Acid (VITAMIN C) 1000 MG tablet Take 1,000 mg by mouth daily.    [provider]  aspirin EC 81 MG tablet Take 1 tablet (81 mg total) by mouth daily. 08/04/18   Leonie Man, MD  budesonide-formoterol [facility]) 160-4.5 MCG/ACT inhaler Inhale 2 puffs into the lungs 2 (two) times daily. 10/31/20   Valentina Shaggy, MD  cetirizine (ZYRTEC) 10 MG tablet Take 1 tablet (10 mg total) by mouth daily. 06/16/13   Ruben Im, PA-C  cholecalciferol (VITAMIN D) 1000 UNITS tablet Take 2,000 Units by mouth daily.     [provider]  cyclobenzaprine (FLEXERIL) 10 MG tablet Take 10 mg by mouth 3 (three) times daily as needed for muscle spasms.    [provider]  Diethylpropion HCl CR 75 MG TB24 diethylpropion ER 75 mg tablet,extended release  TAKE 1 TABLET BY MOUTH EVERY DAY    [provider]  DULoxetine (CYMBALTA) 30 MG capsule Take 30 mg by mouth daily. 11/29/19   [provider]  ELMIRON 100 MG capsule Take 100 mg by mouth daily. 11/17/19   [provider]  estradiol (VIVELLE-DOT) 0.075 MG/24HR Place 1 patch onto the skin 2 (two) times a week.    [provider]  gabapentin (NEURONTIN) 300 MG capsule Take 300 mg by mouth daily as needed. 11/29/19   [provider]  HYDROcodone-acetaminophen (NORCO) 10-325 MG tablet TK 1 T PO Q 8 H PRN FOR PAIN 07/17/18   [provider]  hydrOXYzine (ATARAX/VISTARIL) 25 MG tablet Take 25 mg by mouth at bedtime. Patient not taking: Reported on 10/31/2020 08/15/19   [provider]  levalbuterol Penne Lash) 1.25 MG/3ML nebulizer solution Take 1.25 mg by nebulization every 4 (four) hours as needed for wheezing. 06/13/20   Valentina Shaggy, MD  LORazepam (ATIVAN) 2 MG tablet Take 2 mg by mouth 3 (three) times daily as needed. 12/17/19   [provider]  meclizine (ANTIVERT) 25 MG tablet meclizine 25 mg  tablet  TAKE 1 TABLET BY MOUTH EVERY 6 HOURS AS NEEDED FOR DIZZINESS    [provider]  montelukast (SINGULAIR) 10 MG tablet Take 1 tablet (10 mg total) by mouth at bedtime. 10/31/20   Valentina Shaggy, MD  nitroGLYCERIN (NITROSTAT) 0.4 MG SL tablet Place 1 tablet (0.4 mg total) under the tongue every 5 (five) minutes as needed for chest pain.  02/27/21 05/28/21  Leonie Man, MD  predniSONE (DELTASONE) 10 MG tablet Take two tablets (20mg ) twice daily for three days, then one tablet (10mg ) twice daily for three days, then STOP. 10/31/20   Valentina Shaggy, MD  progesterone (ENDOMETRIN) 100 MG vaginal insert Place 100 mg vaginally at bedtime.    [provider]  promethazine (PHENERGAN) 25 MG tablet promethazine 25 mg tablet  TAKE 1 TABLET BY MOUTH EVERY 4 HOURS AS NEEDED FOR NAUSEA    [provider]  triamterene-hydrochlorothiazide (DYAZIDE) 37.5-25 MG capsule Take 1 capsule by mouth daily. 11/16/19   [provider]  valACYclovir (VALTREX) 500 MG tablet Take 500 mg by mouth as needed. 11/16/19   [provider]  ziprasidone (GEODON) 40 MG capsule Take 40 mg by mouth 2 (two) times daily with a meal.    [provider]  zolpidem (AMBIEN) 10 MG tablet Take 10 mg by mouth at bedtime. 12/09/19   [provider]    Allergies    Oysters [shellfish allergy], Zoloft [sertraline hcl], Latex, Amitriptyline, Clindamycin, Clindamycin/lincomycin, Other, Penicillins, Pentosan polysulfate sodium, Sulfa antibiotics, Topamax [topiramate], Uribel [meth-hyo-m bl-na phos-ph sal], Uribel [urelle], Lamotrigine, and Sulfamethoxazole-trimethoprim  Review of Systems   Review of Systems 10 systems reviewed and negative except as per HPI Physical Exam Updated Vital Signs BP 135/76   Pulse 89   Temp 97.8 F (36.6 C) (Oral)   Resp (!) 24   SpO2 92%   Physical Exam Constitutional:      Comments: Alert nontoxic.  No significant respiratory  distress at rest  HENT:     Mouth/Throat:     Pharynx: Oropharynx is clear.  Eyes:     Extraocular Movements: Extraocular movements intact.  Cardiovascular:     Rate and Rhythm: Normal rate.  Pulmonary:     Comments: No respiratory distress at rest.  Diffuse wheezing throughout lung fields Abdominal:     General: There is no distension.     Palpations: Abdomen is soft.     Tenderness: There is no abdominal tenderness. There is no guarding.  Musculoskeletal:        General: No swelling or tenderness. Normal range of motion.     Right lower leg: No edema.     Left lower leg: No edema.  Skin:    General: Skin is warm and dry.  Neurological:     General: No focal deficit present.     Mental Status: She is oriented to person, place, and time.     Coordination: Coordination normal.  Psychiatric:        Mood and Affect: Mood normal.     ED Results / Procedures / Treatments   Labs (all labs ordered are listed, but only abnormal results are displayed) Labs Reviewed  URINALYSIS, ROUTINE W REFLEX MICROSCOPIC - Abnormal; Notable for the following components:      Result Value   Color, Urine STRAW (*)    Specific Gravity, Urine 1.003 (*)    All other components within normal limits  BLOOD GAS, VENOUS - Abnormal; Notable for the following components:   pCO2, Ven 41.9 (*)    pO2, Ven 60.3 (*)    All other components within normal limits  CULTURE, BLOOD (ROUTINE X 2)  CULTURE, BLOOD (ROUTINE X 2)  RESP PANEL BY RT-PCR (FLU A&B, COVID) ARPGX2  LACTIC ACID, PLASMA  CBC WITH DIFFERENTIAL/PLATELET  COMPREHENSIVE METABOLIC PANEL  LIPASE, BLOOD  BRAIN NATRIURETIC PEPTIDE  LACTIC ACID, PLASMA  TROPONIN I (  HIGH SENSITIVITY)  TROPONIN I (HIGH SENSITIVITY)    EKG EKG Interpretation  Date/Time:  Friday Mar 09 2021 13:21:39 EDT Ventricular Rate:  89 PR Interval:  114 QRS Duration: 80 QT Interval:  378 QTC Calculation: 459 R Axis:   56 Text Interpretation: Normal sinus rhythm  Normal ECG no ischemic appearance, no sig change from previous Confirmed by Charlesetta Shanks [phone]) on 03/09/2021 2:19:01 PM   Radiology No results found.  Procedures Procedures   Medications Ordered in ED Medications  ipratropium-albuterol (DUONEB) 0.5-2.5 (3) MG/3ML nebulizer solution 3 mL (3 mLs Nebulization Given 03/09/21 1358)    ED Course  I have reviewed the triage vital signs and the nursing notes.  Pertinent labs & imaging results that were available during my care of the patient were reviewed by me and considered in my medical decision making (see chart for details).  Clinical Course as of 03/09/21 1512  Fri Mar 09, 2021  1511 Consult: Dr. Neysa Bonito accepts for [facility] admission [MP]    Clinical Course User Index [MP] Charlesetta Shanks, MD   MDM Rules/Calculators/A&P                         Patient has been having URI symptoms for over a week.  She is not responding to antibiotic therapy.  She does not wear home oxygen.  She also has history of asthma.  She has continued to wheeze and was seen with hypoxia at high 80s this afternoon.  She was referred to the emergency department after getting duo nebs and Decadron with persistent hypoxia and wheezing.  At this time, we will proceed with additional diagnostic evaluation.  If patient remains hypoxic off room air, will require admission.    Additional DuoNeb patient is rechecked.  She continues to have extensive wheezing throughout the lung fields.  Off oxygen, oxygen saturation trends down to 90%.  Patient placed back on 2 L oxygen.  Will admit for asthma exacerbation with hypoxia.  Suspect viral illness.  RODERICK SWEEZY was evaluated in Emergency Department on 03/09/2021 for the symptoms described in the history of present illness. She was evaluated in the context of the global COVID-19 pandemic, which necessitated consideration that the patient might be at risk for infection with the SARS-CoV-2 virus that causes COVID-19.  Institutional protocols and algorithms that pertain to the evaluation of patients at risk for COVID-19 are in a state of rapid change based on information released by regulatory bodies including the CDC and federal and state organizations. These policies and algorithms were followed during the patient's care in the ED.  Final Clinical Impression(s) / ED Diagnoses Final diagnoses:  Moderate persistent asthma with exacerbation  Hypoxia    Rx / DC Orders ED Discharge Orders    None       Charlesetta Shanks, MD 03/09/21 1457

## 2021-03-09 NOTE — ED Triage Notes (Addendum)
Per EMS, pt sent by PCP for shortness of breath. Pt was diagnosed with PNA last week and given z-pack. Pt has one dose left of abx. Pt had O2 of 89% at PCP, was given duoneb and albuterol. Pt placed on 2L [place], sats rose to 95%.   150/86 HR 90s O2 96% on 2L [place]

## 2021-03-10 DIAGNOSIS — J45901 Unspecified asthma with (acute) exacerbation: Secondary | ICD-10-CM | POA: Diagnosis not present

## 2021-03-10 LAB — CBC
HCT: 40.7 % (ref 36.0–46.0)
Hemoglobin: 13.4 g/dL (ref 12.0–15.0)
MCH: 29.6 pg (ref 26.0–34.0)
MCHC: 32.9 g/dL (ref 30.0–36.0)
MCV: 90 fL (ref 80.0–100.0)
Platelets: 228 10*3/uL (ref 150–400)
RBC: 4.52 MIL/uL (ref 3.87–5.11)
RDW: 13.6 % (ref 11.5–15.5)
WBC: 12.6 10*3/uL — ABNORMAL HIGH (ref 4.0–10.5)
nRBC: 0 % (ref 0.0–0.2)

## 2021-03-10 LAB — BASIC METABOLIC PANEL
Anion gap: 9 (ref 5–15)
BUN: 11 mg/dL (ref 6–20)
CO2: 25 mmol/L (ref 22–32)
Calcium: 9.3 mg/dL (ref 8.9–10.3)
Chloride: 101 mmol/L (ref 98–111)
Creatinine, Ser: 0.57 mg/dL (ref 0.44–1.00)
GFR, Estimated: >60 mL/min (ref >60)
Glucose, Bld: 151 mg/dL — ABNORMAL HIGH (ref 70–99)
Potassium: 3.9 mmol/L (ref 3.5–5.1)
Sodium: 135 mmol/L (ref 135–145)

## 2021-03-10 LAB — GLUCOSE, CAPILLARY
Glucose-Capillary: 145 mg/dL — ABNORMAL HIGH (ref 70–99)
Glucose-Capillary: 122 mg/dL — ABNORMAL HIGH (ref 70–99)

## 2021-03-10 LAB — HEMOGLOBIN A1C
Hgb A1c MFr Bld: 6 % — ABNORMAL HIGH (ref 4.8–5.6)
Mean Plasma Glucose: 125.5 mg/dL

## 2021-03-10 MED ORDER — ENOXAPARIN SODIUM 40 MG/0.4ML IJ SOSY
40.0000 mg | PREFILLED_SYRINGE | INTRAMUSCULAR | Status: DC
  Administered 2021-03-10: 40 mg via SUBCUTANEOUS
  Filled 2021-03-10: qty 0.4

## 2021-03-10 MED ORDER — ALBUTEROL SULFATE (2.5 MG/3ML) 0.083% IN NEBU
2.5000 mg | INHALATION_SOLUTION | RESPIRATORY_TRACT | 0 refills | Status: DC | PRN
Start: 2021-03-10 — End: 2021-03-11

## 2021-03-10 MED ORDER — GUAIFENESIN-DM 100-10 MG/5ML PO SYRP
5.0000 mL | ORAL_SOLUTION | ORAL | Status: DC | PRN
  Filled 2021-03-10: qty 5

## 2021-03-10 MED ORDER — INSULIN ASPART 100 UNIT/ML IJ SOLN: 0.0000 [IU] | Freq: Every day | INTRAMUSCULAR | Status: DC

## 2021-03-10 MED ORDER — SODIUM CHLORIDE 3 % IN NEBU
4.0000 mL | INHALATION_SOLUTION | Freq: Every day | RESPIRATORY_TRACT | Status: DC
  Administered 2021-03-10 – 2021-03-11 (×2): 4 mL via RESPIRATORY_TRACT
  Filled 2021-03-10 (×2): qty 4

## 2021-03-10 MED ORDER — AZITHROMYCIN 250 MG PO TABS: 250.0000 mg | ORAL_TABLET | Freq: Every day | ORAL | Status: DC

## 2021-03-10 MED ORDER — AZITHROMYCIN 250 MG PO TABS: 500.0000 mg | ORAL_TABLET | Freq: Every day | ORAL | Status: DC

## 2021-03-10 MED ORDER — GABAPENTIN 300 MG PO CAPS
300.0000 mg | ORAL_CAPSULE | Freq: Three times a day (TID) | ORAL | Status: DC | PRN
  Administered 2021-03-10 – 2021-03-11 (×2): 300 mg via ORAL
  Filled 2021-03-10 (×3): qty 1

## 2021-03-10 MED ORDER — INSULIN ASPART 100 UNIT/ML IJ SOLN
0.0000 [IU] | Freq: Three times a day (TID) | INTRAMUSCULAR | Status: DC
  Administered 2021-03-10 – 2021-03-11 (×2): 1 [IU] via SUBCUTANEOUS

## 2021-03-10 MED ORDER — GUAIFENESIN ER 600 MG PO TB12
1200.0000 mg | ORAL_TABLET | Freq: Two times a day (BID) | ORAL | Status: DC
  Administered 2021-03-10 – 2021-03-11 (×2): 1200 mg via ORAL
  Filled 2021-03-10 (×3): qty 2

## 2021-03-10 NOTE — Progress Notes (Signed)
Patient very anxious this day with moderate to severe pain. Anxiety medication given with little affect to help with nerves. Pain medication given to help with pain to right lower back with little progress in alleviating pain. Patient willing to try muscle spasm medication, pending results. Will continue to monitor.

## 2021-03-10 NOTE — Progress Notes (Addendum)
Patient educated on IS.  Good effort.

## 2021-03-10 NOTE — Progress Notes (Signed)
PROGRESS NOTE  NEHEMIAH MONTEE IOE:[id number] DOB: 09-May-1961 DOA: 03/09/2021 PCP: Dian Queen, MD  HPI/Recap of past 24 hours: This is a 60 year old female with past medical history of fibromyalgia, tobacco use, HCV s/p treatment, GERD, hyperlipidemia, depression, anxiety, angioedema, asthma who presented to the ED from her PCP office with shortness of breath, nonproductive cough and hypoxia at the office to 89%.  She was diagnosed with pneumonia last week and given a Z-Pak and has nearly completed it, today is her last dose.  Has been using her home inhalers without much improvement and not much improvement with the azithromycin.  She was treated with DuoNeb and Decadron IM in the office prior to arrival and placed on 2 L nasal cannula.  Admits to wheezing.  Does feel a bit better after getting the steroid and neb treatments earlier today.  Smokes 1/2 ppd cigarettes.  No known fevers or sick contacts.  No lower extremity swelling, chest pain or any other complaints.   ED Course: Afebrile, hemodynamically stable, placed on 2 L/min. Notable Labs: COVID-19 and flu negative and overall unremarkable. Notable Imaging: CXR-unremarkable. Patient received DuoNeb x1.   03/10/21: Patient was seen and examined at bedside.  Diffusely wheezing on lung auscultation.  Dyspnea with minimal exertion.  We will continue current management with IV steroids, bronchodilators for her asthma exacerbation.  We will add Z-Pak due to superimposed COPD for his anti-inflammatory properties.  Assessment/Plan: Principal Problem:   Asthma exacerbation Active Problems:   Generalized anxiety disorder   MDD (major depressive disorder), recurrent, severe, with psychosis ([facility])   Acute respiratory failure with hypoxia ([facility])  Asthma/COPD exacerbation, POA Ongoing tobacco use, likely contributing She was started on IV steroids and bronchodilators, continue Add Z-Pak Pulmonary toilet Incentive spirometer Mobilize as  tolerated Tobacco cessation  Acute hypoxic respiratory failure secondary to above. Not on oxygen supplementation at baseline Currently requiring 2 L to maintain O2 saturation greater than 92%. Obtain home oxygen evaluation. Treat underlying conditions  Hyperglycemia, likely exacerbated by IV steroid Obtain hemoglobin A1c Cover with insulin sliding scale.  Leukocytosis, likely reactive in the setting of IV steroid Independently reviewed chest x-ray done admission which shows no evidence of lobular infiltrates to suggest pneumonia. Will obtain procalcitonin level in the morning  Chronic anxiety/depression/fibromyalgia Continue home regimen.  Ongoing tobacco use disorder Tobacco cessation counseling Nicotine patch     Code Status: Full code.  Family Communication: None at bedside  Disposition Plan: Back.  Discharge to home on 03/11/2021 once symptomatology has improved.   Consultants:  None.  Procedures:  None.  Antimicrobials:  Azithromycin  DVT prophylaxis: Subcu enoxaparin.  Status is: Observation    Dispo:  Patient From: Home  Planned Disposition: Home possibly on 03/11/2021 when symptomatology has improved.  Medically stable for discharge: Yes       Objective: Vitals:   03/10/21 0932 03/10/21 0934 03/10/21 1311 03/10/21 1332  BP:   (!) 108/97   Pulse:   (!) 101   Resp:   18   Temp:   98.1 F (36.7 C)   TempSrc:      SpO2: 93% 93% 94% 94%    Intake/Output Summary (Last 24 hours) at 03/10/2021 1533 Last data filed at 03/10/2021 1300 Gross per 24 hour  Intake 1804.13 ml  Output 0 ml  Net 1804.13 ml   There were no vitals filed for this visit.  Exam:  . General: 60 y.o. year-old female well developed well nourished in no acute distress.  Alert  and oriented x3. . Cardiovascular: Regular rate and rhythm with no rubs or gallops.  No thyromegaly or JVD noted.   Marland Kitchen Respiratory: Diffuse wheezing bilaterally with poor inspiratory effort.  Mild  rales at bases.  Mild use of accessory muscles to breathe. . Abdomen: Soft nontender nondistended with normal bowel sounds x4 quadrants. . Musculoskeletal: No lower extremity edema. 2/4 pulses in all 4 extremities. . Skin: No ulcerative lesions noted or rashes, . Psychiatry: Mood is anxious.   Data Reviewed: CBC: Recent Labs  Lab 03/09/21 1300 03/10/21 0704  WBC 8.2 12.6*  NEUTROABS 7.3  --   HGB 13.9 13.4  HCT 43.1 40.7  MCV 92.9 90.0  PLT 230 073   Basic Metabolic Panel: Recent Labs  Lab 03/09/21 1300 03/10/21 0704  NA 139 135  K 4.2 3.9  CL 104 101  CO2 26 25  GLUCOSE 147* 151*  BUN 9 11  CREATININE 0.56 0.57  CALCIUM 9.5 9.3   GFR: CrCl cannot be calculated (Unknown ideal weight.). Liver Function Tests: Recent Labs  Lab 03/09/21 1300  AST 18  ALT 14  ALKPHOS 48  BILITOT 0.3  PROT 7.0  ALBUMIN 4.1   Recent Labs  Lab 03/09/21 1300  LIPASE 20   No results for input(s): AMMONIA in the last 168 hours. Coagulation Profile: No results for input(s): INR, PROTIME in the last 168 hours. Cardiac Enzymes: No results for input(s): CKTOTAL, CKMB, CKMBINDEX, TROPONINI in the last 168 hours. BNP (last 3 results) No results for input(s): PROBNP in the last 8760 hours. HbA1C: No results for input(s): HGBA1C in the last 72 hours. CBG: No results for input(s): GLUCAP in the last 168 hours. Lipid Profile: No results for input(s): CHOL, HDL, LDLCALC, TRIG, CHOLHDL, LDLDIRECT in the last 72 hours. Thyroid Function Tests: No results for input(s): TSH, T4TOTAL, FREET4, T3FREE, THYROIDAB in the last 72 hours. Anemia Panel: No results for input(s): VITAMINB12, FOLATE, FERRITIN, TIBC, IRON, RETICCTPCT in the last 72 hours. Urine analysis:    Component Value Date/Time   COLORURINE STRAW (A) 03/09/2021 1330   APPEARANCEUR CLEAR 03/09/2021 1330   LABSPEC 1.003 (L) 03/09/2021 1330   PHURINE 7.0 03/09/2021 1330   GLUCOSEU NEGATIVE 03/09/2021 1330   HGBUR NEGATIVE  03/09/2021 1330   BILIRUBINUR NEGATIVE 03/09/2021 1330   KETONESUR NEGATIVE 03/09/2021 1330   PROTEINUR NEGATIVE 03/09/2021 1330   UROBILINOGEN 0.2 06/05/2013 2314   NITRITE NEGATIVE 03/09/2021 1330   LEUKOCYTESUR NEGATIVE 03/09/2021 1330   Sepsis Labs: @LABRCNTIP (procalcitonin:4,lacticidven:4)  ) Recent Results (from the past 240 hour(s))  Resp Panel by RT-PCR (Flu A&B, Covid) Nasopharyngeal Swab     Status: None   Collection Time: 03/09/21  1:01 PM   Specimen: Nasopharyngeal Swab; Nasopharyngeal(NP) swabs in vial transport medium  Result Value Ref Range Status   SARS Coronavirus 2 by RT PCR NEGATIVE NEGATIVE Final    Comment: (NOTE) SARS-CoV-2 target nucleic acids are NOT DETECTED.  The SARS-CoV-2 RNA is generally detectable in upper respiratory specimens during the acute phase of infection. The lowest concentration of SARS-CoV-2 viral copies this assay can detect is 138 copies/mL. A negative result does not preclude SARS-Cov-2 infection and should not be used as the sole basis for treatment or other patient management decisions. A negative result may occur with  improper specimen collection/handling, submission of specimen other than nasopharyngeal swab, presence of viral mutation(s) within the areas targeted by this assay, and inadequate number of viral copies(<138 copies/mL). A negative result must be combined with clinical observations,  patient history, and epidemiological information. The expected result is Negative.  Fact Sheet for Patients:  EntrepreneurPulse.com.au  Fact Sheet for Healthcare Providers:  IncredibleEmployment.be  This test is no t yet approved or cleared by the [place] FDA and  has been authorized for detection and/or diagnosis of SARS-CoV-2 by FDA under an Emergency Use Authorization (EUA). This EUA will remain  in effect (meaning this test can be used) for the duration of the COVID-19 declaration under  Section 564(b)(1) of the Act, 21 U.S.C.section 360bbb-3(b)(1), unless the authorization is terminated  or revoked sooner.       Influenza A by PCR NEGATIVE NEGATIVE Final   Influenza B by PCR NEGATIVE NEGATIVE Final    Comment: (NOTE) The Xpert Xpress SARS-CoV-2/FLU/RSV plus assay is intended as an aid in the diagnosis of influenza from Nasopharyngeal swab specimens and should not be used as a sole basis for treatment. Nasal washings and aspirates are unacceptable for Xpert Xpress SARS-CoV-2/FLU/RSV testing.  Fact Sheet for Patients: EntrepreneurPulse.com.au  Fact Sheet for Healthcare Providers: IncredibleEmployment.be  This test is not yet approved or cleared by the [place] FDA and has been authorized for detection and/or diagnosis of SARS-CoV-2 by FDA under an Emergency Use Authorization (EUA). This EUA will remain in effect (meaning this test can be used) for the duration of the COVID-19 declaration under Section 564(b)(1) of the Act, 21 U.S.C. section 360bbb-3(b)(1), unless the authorization is terminated or revoked.  Performed at [facility], [place] [address] [place], [place] [address]       Studies: No results found.  Scheduled Meds: . aspirin EC  81 mg Oral Daily  . DULoxetine  30 mg Oral Daily  . fluticasone furoate-vilanterol  1 puff Inhalation Daily  . ipratropium-albuterol  3 mL Nebulization TID  . methylPREDNISolone (SOLU-MEDROL) injection  40 mg Intravenous Q6H   Followed by  . [START ON 03/11/2021] predniSONE  40 mg Oral Q breakfast  . montelukast  10 mg Oral QHS  . nicotine  14 mg Transdermal Daily  . triamterene-hydrochlorothiazide  1 tablet Oral Daily  . ziprasidone  40 mg Oral BID WC    Continuous Infusions:   LOS: 0 days     Kayleen Memos, MD [facility] Pager [phone]  If 7PM-7AM, please contact night-coverage [email or web address] Password [facility] 03/10/2021, 3:33 PM

## 2021-03-11 DIAGNOSIS — J45901 Unspecified asthma with (acute) exacerbation: Secondary | ICD-10-CM | POA: Diagnosis not present

## 2021-03-11 LAB — CBC
HCT: 40.1 % (ref 36.0–46.0)
Hemoglobin: 13.2 g/dL (ref 12.0–15.0)
MCH: 29.6 pg (ref 26.0–34.0)
MCHC: 32.9 g/dL (ref 30.0–36.0)
MCV: 89.9 fL (ref 80.0–100.0)
Platelets: 185 10*3/uL (ref 150–400)
RBC: 4.46 MIL/uL (ref 3.87–5.11)
RDW: 13.6 % (ref 11.5–15.5)
WBC: 13.9 10*3/uL — ABNORMAL HIGH (ref 4.0–10.5)
nRBC: 0 % (ref 0.0–0.2)

## 2021-03-11 LAB — BASIC METABOLIC PANEL
Anion gap: 6 (ref 5–15)
BUN: 15 mg/dL (ref 6–20)
CO2: 25 mmol/L (ref 22–32)
Calcium: 9 mg/dL (ref 8.9–10.3)
Chloride: 102 mmol/L (ref 98–111)
Creatinine, Ser: 0.64 mg/dL (ref 0.44–1.00)
GFR, Estimated: >60 mL/min (ref >60)
Glucose, Bld: 93 mg/dL (ref 70–99)
Potassium: 3.7 mmol/L (ref 3.5–5.1)
Sodium: 133 mmol/L — ABNORMAL LOW (ref 135–145)

## 2021-03-11 LAB — GLUCOSE, CAPILLARY
Glucose-Capillary: 94 mg/dL (ref 70–99)
Glucose-Capillary: 143 mg/dL — ABNORMAL HIGH (ref 70–99)

## 2021-03-11 LAB — PROCALCITONIN: Procalcitonin: <0.1 ng/mL

## 2021-03-11 MED ORDER — BUDESONIDE-FORMOTEROL FUMARATE 160-4.5 MCG/ACT IN AERO: 2.0000 | INHALATION_SPRAY | Freq: Two times a day (BID) | RESPIRATORY_TRACT | 0 refills | Status: DC

## 2021-03-11 MED ORDER — NICOTINE 14 MG/24HR TD PT24: 14.0000 mg | MEDICATED_PATCH | Freq: Every day | TRANSDERMAL | 0 refills | Status: DC

## 2021-03-11 MED ORDER — ALBUTEROL SULFATE (2.5 MG/3ML) 0.083% IN NEBU: 2.5000 mg | INHALATION_SOLUTION | RESPIRATORY_TRACT | 0 refills | Status: DC | PRN

## 2021-03-11 MED ORDER — HYDROCODONE-ACETAMINOPHEN 5-325 MG PO TABS: 1.0000 | ORAL_TABLET | Freq: Every day | ORAL | 0 refills | Status: AC | PRN

## 2021-03-11 MED ORDER — PREDNISONE 5 MG PO TABS: 40.0000 mg | ORAL_TABLET | Freq: Every day | ORAL | 0 refills | Status: DC

## 2021-03-11 NOTE — Progress Notes (Signed)
Patient will be discharging home with friend. Belongings were returned. Medication education will be provided.

## 2021-03-11 NOTE — Discharge Instructions (Signed)
Asthma, Adult  Asthma is a long-term (chronic) condition in which the airways get tight and narrow. The airways are the breathing passages that lead from the nose and mouth down into the lungs. A person with asthma will have times when symptoms get worse. These are called asthma attacks. They can cause coughing, whistling sounds when you breathe (wheezing), shortness of breath, and chest pain. They can make it hard to breathe. There is no cure for asthma, but medicines and lifestyle changes can help control it. There are many things that can bring on an asthma attack or make asthma symptoms worse (triggers). Common triggers include:  Mold.  Dust.  Cigarette smoke.  Cockroaches.  Things that can cause allergy symptoms (allergens). These include animal skin flakes (dander) and pollen from trees or grass.  Things that pollute the air. These may include household cleaners, wood smoke, smog, or chemical odors.  Cold air, weather changes, and wind.  Crying or laughing hard.  Stress.  Certain medicines or drugs.  Certain foods such as dried fruit, potato chips, and grape juice.  Infections, such as a cold or the flu.  Certain medical conditions or diseases.  Exercise or tiring activities. Asthma may be treated with medicines and by staying away from the things that cause asthma attacks. Types of medicines may include:  Controller medicines. These help prevent asthma symptoms. They are usually taken every day.  Fast-acting reliever or rescue medicines. These quickly relieve asthma symptoms. They are used as needed and provide short-term relief.  Allergy medicines if your attacks are brought on by allergens.  Medicines to help control the body's defense (immune) system. Follow these instructions at home: Avoiding triggers in your home  Change your heating and air conditioning filter often.  Limit your use of fireplaces and wood stoves.  Get rid of pests (such as roaches and  mice) and their droppings.  Throw away plants if you see mold on them.  Clean your floors. Dust regularly. Use cleaning products that do not smell.  Have someone vacuum when you are not home. Use a vacuum cleaner with a HEPA filter if possible.  Replace carpet with wood, tile, or vinyl flooring. Carpet can trap animal skin flakes and dust.  Use allergy-proof pillows, mattress covers, and box spring covers.  Wash bed sheets and blankets every week in hot water. Dry them in a dryer.  Keep your bedroom free of any triggers.  Avoid pets and keep windows closed when things that cause allergy symptoms are in the air.  Use blankets that are made of polyester or cotton.  Clean bathrooms and kitchens with bleach. If possible, have someone repaint the walls in these rooms with mold-resistant paint. Keep out of the rooms that are being cleaned and painted.  Wash your hands often with soap and water. If soap and water are not available, use hand sanitizer.  Do not allow anyone to smoke in your home. General instructions  Take over-the-counter and prescription medicines only as told by your doctor. ? Talk with your doctor if you have questions about how or when to take your medicines. ? Make note if you need to use your medicines more often than usual.  Do not use any products that contain nicotine or tobacco, such as cigarettes and e-cigarettes. If you need help quitting, ask your doctor.  Stay away from secondhand smoke.  Avoid doing things outdoors when allergen counts are high and when air quality is low.  Wear a ski mask   when doing outdoor activities in the winter. The mask should cover your nose and mouth. Exercise indoors on cold days if you can.  Warm up before you exercise. Take time to cool down after exercise.  Use a peak flow meter as told by your doctor. A peak flow meter is a tool that measures how well the lungs are working.  Keep track of the peak flow meter's readings.  Write them down.  Follow your asthma action plan. This is a written plan for taking care of your asthma and treating your attacks.  Make sure you get all the shots (vaccines) that your doctor recommends. Ask your doctor about a flu shot and a pneumonia shot.  Keep all follow-up visits as told by your doctor. This is important. Contact a doctor if:  You have wheezing, shortness of breath, or a cough even while taking medicine to prevent attacks.  The mucus you cough up (sputum) is thicker than usual.  The mucus you cough up changes from clear or white to yellow, green, gray, or bloody.  You have problems from the medicine you are taking, such as: ? A rash. ? Itching. ? Swelling. ? Trouble breathing.  You need reliever medicines more than 2-3 times a week.  Your peak flow reading is still at 50-79% of your personal best after following the action plan for 1 hour.  You have a fever. Get help right away if:  You seem to be worse and are not responding to medicine during an asthma attack.  You are short of breath even at rest.  You get short of breath when doing very little activity.  You have trouble eating, drinking, or talking.  You have chest pain or tightness.  You have a fast heartbeat.  Your lips or fingernails start to turn blue.  You are light-headed or dizzy, or you faint.  Your peak flow is less than 50% of your personal best.  You feel too tired to breathe normally. Summary  Asthma is a long-term (chronic) condition in which the airways get tight and narrow. An asthma attack can make it hard to breathe.  Asthma cannot be cured, but medicines and lifestyle changes can help control it.  Make sure you understand how to avoid triggers and how and when to use your medicines. This information is not intended to replace advice given to you by your health care provider. Make sure you discuss any questions you have with your health care provider. Document Revised:  02/09/2020 Document Reviewed: 02/09/2020 Elsevier Patient Education  2021 [place], Adult An asthma action plan helps you understand how to manage your asthma and what to do when you have an asthma attack. The action plan is a color-coded plan that lists the symptoms that indicate whether or not your condition is under control and what actions to take.  If you have symptoms in the green zone, it means you are doing well.  If you have symptoms in the yellow zone, it means you are having problems.  If you have symptoms in the red zone, you need medical care right away. Follow the plan that you and your health care provider develop. Review your plan with your health care provider at each visit. What triggers your asthma? Knowing the things that can trigger an asthma attack or make your asthma symptoms worse is very important. Talk to your health care provider about your asthma triggers and how to avoid them. Record your  known asthma triggers here: _______________ What is your personal best peak flow reading? If you use a peak flow meter, determine your personal best reading. Record it here: _______________ Red zone Symptoms in this zone mean that you should get medical help right away. You will likely feel distressed and have symptoms at rest that restrict your activity. You are in the red zone if:  You are breathing hard and quickly.  Your nose opens wide, your ribs show, and your neck muscles become visible when you breathe in.  Your lips, fingers, or toes are a bluish color.  You have trouble speaking in full sentences.  Your peak flow reading is less than __________ (less than 50% of your personal best).  Your symptoms do not improve within 15-20 minutes after you use your reliever or rescue medicine (bronchodilator). If you have any of these symptoms:  Call your local emergency services (911 in the [place] or go to the nearest emergency room.  Use your  reliever or rescue medicine. ? Start a nebulizer treatment or take 2-4 puffs from a metered-dose inhaler with a spacer. ? Repeat this action every 15-20 minutes until help arrives.   Yellow zone Symptoms in this zone mean that your condition may be getting worse. You may have symptoms that interfere with exercise, are noticeably worse after exposure to triggers, or are worse at the first sign of a cold (upper respiratory infection). These may include:  Waking from sleep.  Coughing, especially at night or first thing in the morning.  Mild wheezing.  Chest tightness.  A peak flow reading that is __________ to __________ (50-79% of your personal best). If you have any of these symptoms:  Add the following medicine to the ones that you use daily: ? Reliever or rescue medicine and dosage: _______________ ? Additional medicine and dosage: _______________ Call your health care provider if:  You remain in the yellow zone for __________ hours.  You are using a reliever or rescue medicine more than 2-3 times a week.   Green zone This zone means that your asthma is under control. You may not have any symptoms while you are in the green zone. This means that you:  Have no coughing or wheezing, even while you are working or playing.  Sleep through the night.  Are breathing well.  Have a peak flow reading that is above __________ (80% of your personal best or greater). If you are in the green zone, continue to manage your asthma as directed:  Take these medicines every day: ? Controller medicine and dosage: _______________ ? Controller medicine and dosage: _______________ ? Controller medicine and dosage: _______________ ? Controller medicine and dosage: _______________  Before exercise, use this reliever or rescue medicine: _______________ Call your health care provider if you are using a reliever or rescue medicine more than 2-3 times a week.   Where to find more information You can  find more information about asthma from:  Centers for Disease Control and Prevention: SamedayLab.co.za  American Lung Association: www.lung.org This information is not intended to replace advice given to you by your health care provider. Make sure you discuss any questions you have with your health care provider. Document Revised: 02/01/2020 Document Reviewed: 02/01/2020 Elsevier Patient Education  2021 [place].   Asthma Attack Prevention, Adult Although you may not be able to control the fact that you have asthma, you can take actions to prevent episodes of asthma (asthma attacks). How can this condition affect me? Asthma attacks (  flare ups) can cause trouble breathing, wheezing, and coughing. They may keep you from doing activities you like to do. What can increase my risk? Coming into contact with things that cause asthma symptoms (asthma triggers) can put you at risk for an asthma attack. Common asthma triggers include:  Things you are allergic to (allergens), such as: ? Dust mite and cockroach droppings. ? Pet dander. ? Mold. ? Pollen from trees and grasses. ? Food allergies. This might be a specific food or added chemicals called sulfites.  Irritants, such as: ? Weather changes including very cold, dry, or humid air. ? Smoke. This includes campfire smoke, air pollution, and tobacco smoke. ? Strong odors from aerosol sprays and fumes from perfume, candles, and household cleaners.  Other triggers, such as: ? Certain medicines. This includes NSAIDs, such as ibuprofen and aspirin. ? Viral respiratory infections (colds), including runny nose (rhinitis) or infection in the sinuses (sinusitis). ? Activity including exercise, laughing, or crying. ? Not using inhaled medicines (corticosteroids) as told. What actions can I take to prevent an asthma attack?  Stay healthy. Stay up to date on all immunizations as told by your health care provider, including the yearly flu  (influenza) vaccine and pneumonia vaccine.  Many asthma attacks can be prevented by carefully following your written asthma action plan. Follow your asthma action plan Work with your health care provider to create a written asthma action plan. This plan should include:  A list of your asthma triggers and how to avoid or reduce them.  A list of symptoms that you may have during an asthma attack.  Information about which medicine to take, when to take the medicine, and how much of the medicine to take.  Information to help you understand your peak flow measurements.  Daily actions that you can take to prevent (control) your asthma symptoms.  Contact information for your health care providers.  If you have an asthma attack, act quickly. Follow the emergency steps on your written asthma action plan. This may prevent you from needing to go to the hospital. Monitor your asthma. To do this:  Use your peak flow meter every morning and every evening for 2-3 weeks or as told by your health care provider. ? Record the results in a journal. ? A drop in your peak flow numbers on one or more days may mean that you are starting to have an asthma attack, even if you are not having symptoms.  When you have asthma symptoms, write them down in a journal.  Write down in your journal how often you need to use your fast-acting rescue inhaler. If you are using your rescue inhaler more often, it may mean that your asthma is not under control. Talk with your health care provider about adjusting your asthma treatment plan to help you prevent future asthma attacks and gain better control of your condition.   Lifestyle  Avoid or reduce contact with known outdoor allergens by staying indoors, keeping windows closed, and using air conditioning when pollen and mold counts are high.  Do not use any products that contain nicotine or tobacco, such as cigarettes, e-cigarettes, and chewing tobacco. If you need help  quitting, ask your health care provider.  If you are overweight, consider a weight-loss plan.  Find ways to cope with stress and your feelings, such as mindfulness, relaxation, or breathing exercises.  Ask your health care provider if a breathing exercise program (pulmonary rehabilitation) may be helpful to control symptoms and improve  your quality of life. Medicines  Take over-the-counter and prescription medicines only as told by your health care provider.  Do not stop taking your medicine and do not take less medicine even if you are doing well.  Let your health care provider know: ? How often you use your rescue inhaler. ? How often you have symptoms when you are taking your regular medicines. ? If you wake up at night because of asthma symptoms. ? If you have more trouble with your breathing when you exercise.   Activity  Do your normal activities as told by your health care provider. Ask your health care provider what activities are safe for you.  Some people have asthma symptoms or more asthma symptoms when they exercise. This is called exercise-induced bronchoconstriction (EIB). If you have this problem, talk with your health care provider about how to manage EIB. Some tips to follow include: ? Use your fast-acting inhaler before exercise. ? Exercise indoors if it is very cold, humid, or the pollen and mold counts are high. ? Warm up and cool down before and after exercise. ? Stop exercising right away if your asthma symptoms start or get worse. Where to find more information  Asthma and Allergy Foundation of America: www.aafa.org  Centers for Disease Control and Prevention: http://www.wolf.info/  American Lung Association: www.lung.org  National Heart, Lung, and Blood Institute: https://wilson-eaton.com/  World Health Organization: RoleLink.com.br Get help right away if:  You have followed your written asthma action plan and your symptoms are not improving. Summary  Asthma attacks  (flare ups) can cause trouble breathing, wheezing, and coughing. They may keep you from doing activities you normally like to do.  Work with your health care provider to create a written asthma action plan.  Do not stop taking your medicine and do not take less medicine even if you are doing well.  Do not use any products that contain nicotine or tobacco, such as cigarettes, e-cigarettes, and chewing tobacco. If you need help quitting, ask your health care provider. This information is not intended to replace advice given to you by your health care provider. Make sure you discuss any questions you have with your health care provider. Document Revised: 10/05/2019 Document Reviewed: 10/05/2019 Elsevier Patient Education  2021 [place].   Asthma and Physical Activity Physical activity is an important part of a healthy lifestyle. If you have asthma, it is important to exercise because physical activity can help you to:  Control your asthma.  Maintain your weight or lose weight.  Increase your energy.  Decrease stress and anxiety.  Lower your risk of getting sick.  Improve your heart health. However, asthma symptoms can flare up when you are physically active or exercising. You can learn how to control your asthma and prevent symptoms during exercise. This will help you to remain physically active. How can asthma affect my ability to be physically active? When you have asthma, physical activity can cause you to have symptoms such as:  Wheezing. This may sound like whistling while breathing.  A feeling of tightness in the chest.  Sore throat.  Coughing.  Shortness of breath.  Tiredness (fatigue) with minimal activity.  Increased sputum production.  Chest pain. What actions can I take to prevent asthma problems during physical activity? Pulmonary rehabilitation Enroll in a pulmonary rehabilitation program. Benefits of this type of program include:  Education on lung  diseases.  Classes that teach you how to exercise and be more active while decreasing your shortness  of breath.  A group setting that allows you to talk with others who have asthma. Asthma action plan Follow the asthma action plan set by your health care provider. Your personal asthma plan may include:  Taking your medicines as told by your health care provider.  Avoiding your asthma triggers, except physical activity. Triggers may include cold air, dust, pollen, pet dander, and air pollution.  Tracking your asthma control.  Using a peak flow meter.  Being aware of worsening symptoms.  Knowing when to seek emergency care. Proper breathing During exercise, follow these tips for proper breathing:  Breathe in before starting the exercise and breathe out during the hardest part of the exercise.  Take slow breaths.  Pace yourself and do not try to go too fast.  While breathing out, purse your lips. Before beginning any exercise program or new activity, talk with your health care provider.   Medicines If physical activity triggers your asthma, your health care provider may order the following medicines:  A rescue inhaler (short-acting beta2-agonist) for you to use shortly before physical activity or exercise. Its effects may reduce exercise-related symptoms for 2-3 hours.  A long-acting beta2-agonist that can offer up to 12 hours of relief if taken daily.  Leukotriene modifiers. These pills are taken several hours before physical activity or exercise to help prevent asthma symptoms that are caused by exercise.  Long-term control medicines. These will be given if you have severe or frequent asthma symptoms during or after exercise. These symptoms may also mean that your asthma is not well controlled.   General information  Exercise indoors when the air is dry or during allergy season.  Try to breathe in warm, moist air by wearing a scarf over your nose and mouth or breathing only  through your nose.  Spend a few minutes warming up before your workout.  Cool down after exercise. What should I do if my asthma symptoms get worse? Contact your health care provider if your asthma symptoms are getting worse. Your asthma is getting worse if:  You have symptoms more often.  Your symptoms are more severe.  Your symptoms get worse at night and make you lose sleep.  Your peak flow number is lower than your personal best or changes a lot from day to day.  Your asthma medicines do not work as well as they used to.  You use your rescue inhaler more often. If you use your rescue inhaler more than 2 days a week, your asthma is not well controlled.  You go to the emergency room or see your health care provider because of an asthma attack. Where to find support  Ask your health care provider about signing up for a pulmonary rehabilitation program.  Ask your health care provider about asthma support groups.  Visit your local community health department.  Check out local hospitals' community health programs. Where can I get more information?  Your health care provider.  American Lung Association: lung.org  National Heart, Lung, and Blood Institute: https://www.hartman-hill.biz/ Contact a health care provider if:  You have trouble walking and talking because you are out of breath. Get help right away if:  Your lips or fingernails are blue.  You are not able to breathe or catch your breath. Summary  Physical activity is an important part of a healthy lifestyle. However, if you have asthma, your symptoms can flare up during exercise or physical activity.  You can prevent problems during physical activity by doing pulmonary rehabilitation,  following an asthma action plan, doing proper breathing, and using medicines.  Talk with your health care provider before starting any exercise program or new activity. This information is not intended to replace advice given to you by your  health care provider. Make sure you discuss any questions you have with your health care provider. Document Revised: 01/26/2019 Document Reviewed: 12/23/2017 Elsevier Patient Education  2021 [place].   How to Use a Nebulizer, Adult  A nebulizer is a device that turns liquid medicine into a mist or vapor that you can breathe in (inhale). This medicine helps to open the air passages in your lungs. You may need to use a nebulizer if you have an acute breathing illness, such as pneumonia. A nebulizer may also be used to treat chronic conditions, such as asthma or chronic obstructive pulmonarydisease (COPD). There are different kinds of nebulizers. With some nebulizers, you breathe in medicine through a mouthpiece. With others, you get medicine through a mask that fits over your nose and mouth. What are the risks? If you use a nebulizer that does not fit right or is not cleaned properly, it can cause some problems, including:  Infection.  Eye irritation.  Delivery of too much medicine or not enough medicine.  Mouth irritation. Supplies needed:  Air compressor (nebulizer machine).  Nebulizer medicine cup (reservoir)and tubing.  Mouthpiece or face mask.  Soap and water.  Sterile or distilled water.  Clean towel. How to use a nebulizer Preparing a nebulizer Take these steps before using your nebulizer: 1. Read the manufacturer's instructions for your nebulizer, as machines vary. 2. Check your medicine. Make sure it has not expired and is not damaged in any way. 3. Wash your hands with soap and water. 4. Put all of the parts of your nebulizer on a sturdy, flat surface. 5. Connect the tubing to the nebulizer machine and to the reservoir. 6. Measure the liquid medicine according to instructions from your health care provider. Pour the liquid into the reservoir. 7. Attach the mouthpiece or mask. 8. Test the nebulizer by turning it on to make sure that a spray comes out. Then, turn  it off. Using a nebulizer Be sure to stop the machine at any time if you start coughing or if the medicine foams or bubbles. 1. Sit in an upright, relaxed position. 2. If your nebulizer has a mask, put it over your nose and mouth. It should fit somewhat snugly, with no gaps around the nose or cheeks where medicine could escape. If you use a mouthpiece, put it in your mouth. Press your lips firmly around the mouthpiece. 3. Turn on the nebulizer. 4. Some nebulizers have a finger valve. If yours does, cover up the air hole so the air gets to the nebulizer. 5. Once the medicine begins to mist out, take slow, deep breaths. If there is a finger valve, release it at the end of your breath. 6. Continue taking slow, deep breaths until the medicine in the nebulizer is gone and no mist appears.      Cleaning a nebulizer The nebulizer and all of its parts must be kept very clean. If the nebulizer and its parts are not cleaned properly, bacteria can grow inside of them. If you inhale the bacteria, you can get sick. Follow the manufacturer's instructions for cleaning your nebulizer. For most nebulizers, you should follow these guidelines:  Clean the mouthpiece or mask and the reservoir by: ? Rinsing them after each use. Use sterile or  distilled water. ? Washing them 1-2 times a week using soap and warm water.  Do not wash the tubing.  After you rinse or wash them, place the parts on a clean towel and let them air-dry completely. After they dry, reconnect the pieces and turn the nebulizer on without any medicine in it. Doing this will blow air through the equipment to help dry it out.  Store the nebulizer in a clean and dust-free place.  Check the filter at least one time every week. Replace the filter if it looks dirty. Follow these instructions at home  Use your nebulizer only as told by your health care provider. Do not use the nebulizer more than directed by your health care provider.  Do not use  any products that contain nicotine or tobacco, such as cigarettes, e-cigarettes, and chewing tobacco. If you need help quitting, ask your health care provider.  Keep all follow-up visits as told by your health care provider. This is important. Where to find more information  [employer]: allergyasthmanetwork.org  American Lung Association: www.lung.org Contact a health care provider if:  You have trouble using the nebulizer.  Your nebulizer foams or stops working.  Your nebulizer does not create a mist after you add medicine and turn it on. Get help right away if:  You continue to have trouble breathing.  Your breathing gets worse during a nebulizer treatment. These symptoms may represent a serious problem that is an emergency. Do not wait to see if the symptoms will go away. Get medical help right away. Call your local emergency services (911 in the [place] Do not drive yourself to the hospital. Summary  A nebulizer is a device that turns liquid medicine into a mist (vapor) that you can breathe in (inhale).  Measure the liquid medicine according to instructions from your health care provider. Pour the liquid into the part of the nebulizer that holds the medicine (reservoir).  Once the medicine begins to mist out, take slow, deep breaths.  Rinse or wash the mouthpiece or mask and the reservoir after each use, and allow them to air-dry completely. This information is not intended to replace advice given to you by your health care provider. Make sure you discuss any questions you have with your health care provider. Document Revised: 06/19/2020 Document Reviewed: 11/17/2019 Elsevier Patient Education  2021 [employer].   Chronic Obstructive Pulmonary Disease  Chronic obstructive pulmonary disease (COPD) is a long-term (chronic) condition that affects the lungs. COPD is a general term that can be used to describe many different lung problems that cause lung swelling  (inflammation) and limit airflow, including chronic bronchitis and emphysema. If you have COPD, your lung function will probably never return to normal. In most cases, it gets worse over time. However, there are steps you can take to slow the progression of the disease and improve your quality of life. What are the causes? This condition may be caused by:  Smoking. This is the most common cause.  Certain genes passed down through families. What increases the risk? The following factors may make you more likely to develop this condition:  Secondhand smoke from cigarettes, pipes, or cigars.  Exposure to chemicals and other irritants such as fumes and dust in the work environment.  Chronic lung conditions or infections. What are the signs or symptoms? Symptoms of this condition include:  Shortness of breath, especially during physical activity.  Chronic cough with a large amount of thick mucus. Sometimes the  cough may not have any mucus (dry cough).  Wheezing.  Rapid breaths.  Gray or bluish discoloration (cyanosis) of the skin, especially in your fingers, toes, or lips.  Feeling tired (fatigue).  Weight loss.  Chest tightness.  Frequent infections.  Episodes when breathing symptoms become much worse (exacerbations).  Swelling in the ankles, feet, or legs. This may occur in later stages of the disease. How is this diagnosed? This condition is diagnosed based on:  Your medical history.  A physical exam. You may also have tests, including:  Lung (pulmonary) function tests. This may include a spirometry test, which measures your ability to exhale properly.  Chest X-ray.  CT scan.  Blood tests. How is this treated? This condition may be treated with:  Medicines. These may include inhaled rescue medicines to treat acute exacerbations as well as long-term, or maintenance, medicines to prevent flare-ups of COPD. ? Bronchodilators help treat COPD by dilating the airways  to allow increased airflow and make your breathing more comfortable. ? Steroids can reduce airway inflammation and help prevent exacerbations.  Smoking cessation. If you smoke, your health care provider may ask you to quit, and may also recommend therapy or replacement products to help you quit.  Pulmonary rehabilitation. This may involve working with a team of health care providers and specialists, such as respiratory, occupational, and physical therapists.  Exercise and physical activity. These are beneficial for nearly all people with COPD.  Nutrition therapy to gain weight, if you are underweight.  Oxygen. Supplemental oxygen therapy is only helpful if you have a low oxygen level in your blood (hypoxemia).  Lung surgery or transplant.  Palliative care. This is to help people with COPD feel comfortable when treatment is no longer working. Follow these instructions at home: Medicines  Take over-the-counter and prescription medicines (inhaled or pills) only as told by your health care provider.  Talk to your health care provider before taking any cough or allergy medicines. You may need to avoid certain medicines that dry out your airways. Lifestyle  If you are a smoker, the most important thing that you can do is to stop smoking. Do not use any products that contain nicotine or tobacco, such as cigarettes and e-cigarettes. If you need help quitting, ask your health care provider. Continuing to smoke will cause the disease to progress faster.  Avoid exposure to things that irritate your lungs, such as smoke, chemicals, and fumes.  Stay active, but balance activity with periods of rest. Exercise and physical activity will help you maintain your ability to do things you want to do.  Learn and use relaxation techniques to manage stress and to control your breathing.  Get the right amount of sleep and get quality sleep. Most adults need 7 or more hours per night.  Eat healthy foods.  Eating smaller, more frequent meals and resting before meals may help you maintain your strength. Controlled breathing Learn and use controlled breathing techniques as directed by your health care provider. Controlled breathing techniques include:  Pursed lip breathing. Start by breathing in (inhaling) through your nose for 1 second. Then, purse your lips as if you were going to whistle and breathe out (exhale) through the pursed lips for 2 seconds.  Diaphragmatic breathing. Start by putting one hand on your abdomen just above your waist. Inhale slowly through your nose. The hand on your abdomen should move out. Then purse your lips and exhale slowly. You should be able to feel the hand on your abdomen  moving in as you exhale. Controlled coughing Learn and use controlled coughing to clear mucus from your lungs. Controlled coughing is a series of short, progressive coughs. The steps of controlled coughing are: 1. Lean your head slightly forward. 2. Breathe in deeply using diaphragmatic breathing. 3. Try to hold your breath for 3 seconds. 4. Keep your mouth slightly open while coughing twice. 5. Spit any mucus out into a tissue. 6. Rest and repeat the steps once or twice as needed. General instructions  Make sure you receive all the vaccines that your health care provider recommends, especially the pneumococcal and influenza vaccines. Preventing infection and hospitalization is very important when you have COPD.  Use oxygen therapy and pulmonary rehabilitation if directed to by your health care provider. If you require home oxygen therapy, ask your health care provider whether you should purchase a pulse oximeter to measure your oxygen level at home.  Work with your health care provider to develop a COPD action plan. This will help you know what steps to take if your condition gets worse.  Keep other chronic health conditions under control as told by your health care provider.  Avoid extreme  temperature and humidity changes.  Avoid contact with people who have an illness that spreads from person to person (is contagious), such as viral infections or pneumonia.  Keep all follow-up visits as told by your health care provider. This is important. Contact a health care provider if:  You are coughing up more mucus than usual.  There is a change in the color or thickness of your mucus.  Your breathing is more labored than usual.  Your breathing is faster than usual.  You have difficulty sleeping.  You need to use your rescue medicines or inhalers more often than expected.  You have trouble doing routine activities such as getting dressed or walking around the house. Get help right away if:  You have shortness of breath while you are resting.  You have shortness of breath that prevents you from: ? Being able to talk. ? Performing your usual physical activities.  You have chest pain lasting longer than 5 minutes.  Your skin color is more blue (cyanotic) than usual.  You measure low oxygen saturations for longer than 5 minutes with a pulse oximeter.  You have a fever.  You feel too tired to breathe normally. Summary  Chronic obstructive pulmonary disease (COPD) is a long-term (chronic) condition that affects the lungs.  Your lung function will probably never return to normal. In most cases, it gets worse over time. However, there are steps you can take to slow the progression of the disease and improve your quality of life.  Treatment for COPD may include taking medicines, quitting smoking, pulmonary rehabilitation, and changes to diet and exercise. As the disease progresses, you may need oxygen therapy, a lung transplant, or palliative care.  To help manage your condition, do not smoke, avoid exposure to things that irritate your lungs, stay up to date on all vaccines, and follow your health care provider's instructions for taking medicines. This information is not  intended to replace advice given to you by your health care provider. Make sure you discuss any questions you have with your health care provider. Document Revised: 09/19/2017 Document Reviewed: 11/11/2016 Elsevier Patient Education  2021 [employer].

## 2021-03-11 NOTE — Progress Notes (Signed)
SATURATION QUALIFICATIONS: (This note is used to comply with regulatory documentation for home oxygen)  Patient Saturations on Room Air at Rest = 91%  Patient Saturations on Room Air while Ambulating = 93%   Please briefly explain why patient needs home oxygen: Patient ambulated well on room air without assistance. Patient does not need at home oxygen.

## 2021-03-11 NOTE — Discharge Summary (Signed)
Discharge Summary  Sarah Reeves TFT:[id number] DOB: Oct 11, 1961  PCP: Sarah Queen, MD  Admit date: 03/09/2021 Discharge date: 03/11/2021  Time spent: 35 minutes.  Recommendations for Outpatient Follow-up:  1. Follow-up with your pulmonologist in 1 to 2 weeks. 2. Follow-up with your cardiologist in 1 to 2 weeks. 3. Follow-up with your PCP within a week. 4. Take your medications as prescribed. 5. Abstain from tobacco use.  Discharge Diagnoses:  Active Hospital Problems   Diagnosis Date Noted  . Asthma exacerbation 03/09/2021  . Acute respiratory failure with hypoxia ([place]) 03/09/2021  . Generalized anxiety disorder 06/26/2018  . MDD (major depressive disorder), recurrent, severe, with psychosis ([place]) 06/26/2018    Resolved Hospital Problems  No resolved problems to display.    Discharge Condition: Stable.  Diet recommendation: Resume previous diet.  Vitals:   03/11/21 0854 03/11/21 1424  BP:  127/85  Pulse:  (!) 102  Resp:  18  Temp:  98.7 F (37.1 C)  SpO2: 93% 92%    History of present illness:  This is a 60 year old female with past medical history of fibromyalgia,ongoing tobacco use disorder,HCV s/p treatment, GERD, hyperlipidemia, chronic depression/anxiety, angioedema, asthma who presented to [facility] ED from her PCP's office with shortness of breath, wheezing, nonproductive coughand hypoxia at the office to 89% on room air. She was diagnosed with pneumonia last week and given a Z-Pak and 1 dose left. Has been using her home inhalers without much improvement. She was treated with DuoNeb and Decadron IM in the office prior to arrival and placed on 2 L nasal cannula.  She was sent to the ED for further evaluation and management.  No evidence of pneumonia or pulmonary edema on chest x-ray. Her respiratory status improved on IV steroids and bronchodilators.    COVID-19 screening test and influenza a and B were all negative.  03/11/21: Patient was seen and  examined at her bedside.  There were no acute events overnight.  She feels better this morning.  Lung auscultation sounds clear.  She passed a home oxygen evaluation with the following results by patient's bedside nurse Sarah Reeves on 03/11/2021:  SATURATION QUALIFICATIONS: (This note is used to comply with regulatory documentation for home oxygen) Patient Saturations on Room Air at Rest = 91% Patient Saturations on Room Air while Ambulating = 93%  Please briefly explain why patient needs home oxygen: Patient ambulated well on room air without assistance. Patient does not need at home oxygen.    Hospital Course:  Principal Problem:   Asthma exacerbation Active Problems:   Generalized anxiety disorder   MDD (major depressive disorder), recurrent, severe, with psychosis ([place])   Acute respiratory failure with hypoxia ([facility])  Asthma/COPD exacerbation, POA Likely contributed by ongoing tobacco use.  Personally reviewed chest x-ray done on admission which does not show evidence of lobular infiltrates to suggest pneumonia or increase in pulmonary vascularity to suggest pulmonary edema. Abstain from tobacco use, continue nicotine patch.   Has completed course of Z-Pak.   Continue home bronchodilators and nebulizers as needed Continue prednisone with taper x10 days. Follow-up with your pulmonologist in 1 to 2 weeks.    Resolved acute hypoxic respiratory failure secondary to above. Not on oxygen supplementation at baseline Initially requiring 2 L to maintain O2 saturation greater than 90%. She passed her home oxygen evaluation with the results as stated above.  Resolved hyperglycemia, likely exacerbated by IV steroid Hemoglobin A1c 6.0 on 03/10/2021. Switched to prednisone taper for outpatient management of asthma.  Leukocytosis,  reactive in the setting of IV steroid Procalcitonin negative. Afebrile. Nonseptic appearing.  Chronic anxiety/depression/fibromyalgia Continue home  regimen. Follow-up with your PCP.  Ongoing tobacco use disorder Tobacco cessation counseling Continue to abstain from tobacco use, continue nicotine patch     Code Status: Full code.   Consultants:  None.  Procedures:  None.  Antimicrobials:  Azithromycin x1 day, to complete outpatient course.   Discharge Exam: BP 127/85 (BP Location: Left Arm)   Pulse (!) 102   Temp 98.7 F (37.1 C) (Oral)   Resp 18   Wt 73.3 kg   SpO2 92%   BMI 26.89 kg/m  . General: 60 y.o. year-old female well developed well nourished in no acute distress.  Alert and oriented x3. . Cardiovascular: Regular rate and rhythm with no rubs or gallops.  No thyromegaly or JVD noted.   Marland Kitchen Respiratory: Clear to auscultation with no wheezes or rales. Good inspiratory effort. . Abdomen: Soft nontender nondistended with normal bowel sounds x4 quadrants. . Musculoskeletal: No lower extremity edema.  . Skin: No ulcerative lesions noted or rashes . Psychiatry: Mood is appropriate for condition and setting  Discharge Instructions You were cared for by a hospitalist during your hospital stay. If you have any questions about your discharge medications or the care you received while you were in the hospital after you are discharged, you can call the unit and asked to speak with the hospitalist on call if the hospitalist that took care of you is not available. Once you are discharged, your primary care physician will handle any further medical issues. Please note that NO REFILLS for any discharge medications will be authorized once you are discharged, as it is imperative that you return to your primary care physician (or establish a relationship with a primary care physician if you do not have one) for your aftercare needs so that they can reassess your need for medications and monitor your lab values.   Allergies as of 03/11/2021      Reactions   Oysters [shellfish Allergy] Other (See Comments)   Unknown  reaction; "MD told me I was allergic"   Zoloft [sertraline Hcl] Other (See Comments)   REACTION: Nightmares, Grind teeth really bad   Latex Itching   Itch and blisters from contact   Amitriptyline    Hot, itching, tongue tingling, breakthrough bleeding.   Clindamycin Other (See Comments)   Clindamycin/lincomycin Hives, Other (See Comments)   Stated that this causes her to get really horse and made her feel really hot   Other Other (See Comments)   Penicillins    Pentosan Polysulfate Sodium Swelling   Throat swelling   Sulfa Antibiotics Other (See Comments)   Topamax [topiramate]    Says it makes her "constipation and she can't urinate normally"   Uribel [meth-hyo-m Bl-na Phos-ph Sal] Nausea Only   Uribel [urelle] Swelling, Other (See Comments)   Patient stated this makes her stomach swell and makes her feel si   Lamotrigine Rash   Sulfamethoxazole-trimethoprim Rash   Patient developed feelings of dizziness, rash and itchy hands.  Patient then vomited and had diarrhea.      Medication List    STOP taking these medications   albuterol 108 (90 Base) MCG/ACT inhaler Commonly known as: Ventolin HFA Replaced by: albuterol (2.5 MG/3ML) 0.083% nebulizer solution   azithromycin 250 MG tablet Commonly known as: ZITHROMAX   zolpidem 10 MG tablet Commonly known as: AMBIEN     TAKE these medications   albuterol (2.5  MG/3ML) 0.083% nebulizer solution Commonly known as: PROVENTIL Take 3 mLs (2.5 mg total) by nebulization every 2 (two) hours as needed for shortness of breath. Replaces: albuterol 108 (90 Base) MCG/ACT inhaler   aspirin EC 81 MG tablet Take 1 tablet (81 mg total) by mouth daily.   budesonide-formoterol 160-4.5 MCG/ACT inhaler Commonly known as: Symbicort Inhale 2 puffs into the lungs 2 (two) times daily.   cetirizine 10 MG tablet Commonly known as: ZYRTEC Take 10 mg by mouth daily.   cholecalciferol 1000 units tablet Commonly known as: VITAMIN D Take 2,000  Units by mouth daily.   cyclobenzaprine 10 MG tablet Commonly known as: FLEXERIL Take 10 mg by mouth 3 (three) times daily as needed for muscle spasms.   DULoxetine 30 MG capsule Commonly known as: CYMBALTA Take 30 mg by mouth daily.   estradiol 0.05 MG/24HR patch Commonly known as: VIVELLE-DOT Place 1 patch onto the skin 2 (two) times a week.   gabapentin 300 MG capsule Commonly known as: NEURONTIN Take 300 mg by mouth daily as needed (nerve pain).   guaiFENesin-codeine 100-10 MG/5ML syrup Take 10 mLs by mouth every 6 (six) hours as needed for cough.   HYDROcodone-acetaminophen 5-325 MG tablet Commonly known as: NORCO/VICODIN Take 1 tablet by mouth daily as needed for up to 3 days for severe pain.   levalbuterol 1.25 MG/3ML nebulizer solution Commonly known as: XOPENEX Take 1.25 mg by nebulization every 4 (four) hours as needed for wheezing.   LORazepam 2 MG tablet Commonly known as: ATIVAN Take 2 mg by mouth in the morning, at noon, and at bedtime.   meclizine 25 MG tablet Commonly known as: ANTIVERT Take 25 mg by mouth 3 (three) times daily as needed for dizziness.   montelukast 10 MG tablet Commonly known as: SINGULAIR Take 1 tablet (10 mg total) by mouth at bedtime.   nicotine 14 mg/24hr patch Commonly known as: NICODERM CQ - dosed in mg/24 hours Place 1 patch (14 mg total) onto the skin daily. Start taking on: Mar 12, 2021   nitroGLYCERIN 0.4 MG SL tablet Commonly known as: NITROSTAT Place 1 tablet (0.4 mg total) under the tongue every 5 (five) minutes as needed for chest pain.   Oxycodone HCl 10 MG Tabs Take 10 mg by mouth every 4 (four) hours.   predniSONE 5 MG tablet Commonly known as: DELTASONE Take 8 tablets (40 mg total) by mouth daily with breakfast. Take 40 mg daily x2 days, then Take 30 mg daily x2 days, then Take 20 mg daily x2 days, then Take 10 mg daily x2 days, then Take 5 mg daily x2 days, then stop. Start taking on: Mar 12, 2021 What  changed:   medication strength  how much to take  how to take this  when to take this  additional instructions   progesterone 100 MG capsule Commonly known as: PROMETRIUM Take 100 mg by mouth at bedtime.   promethazine 25 MG tablet Commonly known as: PHENERGAN Take 25 mg by mouth every 6 (six) hours as needed for nausea or vomiting.   triamterene-hydrochlorothiazide 37.5-25 MG capsule Commonly known as: DYAZIDE Take 1 capsule by mouth daily.   valACYclovir 500 MG tablet Commonly known as: VALTREX Take 500 mg by mouth daily as needed (outbreaks).   vitamin C 1000 MG tablet Take 1,000 mg by mouth daily.   ziprasidone 40 MG capsule Commonly known as: GEODON Take 40 mg by mouth daily.      Allergies  Allergen Reactions  .  Oysters [Shellfish Allergy] Other (See Comments)    Unknown reaction; "MD told me I was allergic"  . Zoloft [Sertraline Hcl] Other (See Comments)    REACTION: Nightmares, Grind teeth really bad  . Latex Itching    Itch and blisters from contact  . Amitriptyline     Hot, itching, tongue tingling, breakthrough bleeding.  . Clindamycin Other (See Comments)  . Clindamycin/Lincomycin Hives and Other (See Comments)    Stated that this causes her to get really horse and made her feel really hot  . Other Other (See Comments)  . Penicillins   . Pentosan Polysulfate Sodium Swelling    Throat swelling  . Sulfa Antibiotics Other (See Comments)  . Topamax [Topiramate]     Says it makes her "constipation and she can't urinate normally"  . Uribel [Meth-Hyo-M Bl-Na Phos-Ph Sal] Nausea Only  . Uribel [Urelle] Swelling and Other (See Comments)    Patient stated this makes her stomach swell and makes her feel si  . Lamotrigine Rash  . Sulfamethoxazole-Trimethoprim Rash    Patient developed feelings of dizziness, rash and itchy hands.  Patient then vomited and had diarrhea.    Follow-up Information    Sarah Queen, MD. Call in 1 day(s).   Specialty:  Obstetrics and Gynecology Why: Please call for a post hospital follow-up appointment. Contact information: [place] [address] [phone]        Leonie Man, MD .   Specialty: Cardiology Contact information: [address] [place] [address] [phone]                The results of significant diagnostics from this hospitalization (including imaging, microbiology, ancillary and laboratory) are listed below for reference.    Significant Diagnostic Studies: DG Chest Port 1 View  Result Date: 03/09/2021 CLINICAL DATA:  Shortness of breath EXAM: PORTABLE CHEST 1 VIEW COMPARISON:  11/18/2019 FINDINGS: The heart size and mediastinal contours are within normal limits. Both lungs are clear. The visualized skeletal structures are unremarkable. IMPRESSION: No active disease. Electronically Signed   By: Donavan Foil M.D.   On: 03/09/2021 15:30    Microbiology: Recent Results (from the past 240 hour(s))  Culture, blood (routine x 2)     Status: None (Preliminary result)   Collection Time: 03/09/21  1:00 PM   Specimen: BLOOD  Result Value Ref Range Status   Specimen Description   Final    BLOOD LEFT ANTECUBITAL Performed at [facility] [address] [place], [place] [address]    Special Requests   Final    BOTTLES DRAWN AEROBIC AND ANAEROBIC Blood Culture results may not be optimal due to an inadequate volume of blood received in culture bottles Performed at [facility] [address], [place], [place] [address]    Culture   Final    NO GROWTH 2 DAYS Performed at [facility] [address] [place], [place] [address]    Report Status PENDING  Incomplete  Resp Panel by RT-PCR (Flu A&B, Covid) Nasopharyngeal Swab     Status: None   Collection Time: 03/09/21  1:01 PM   Specimen: Nasopharyngeal Swab; Nasopharyngeal(NP) swabs in vial transport medium  Result Value Ref  Range Status   SARS Coronavirus 2 by RT PCR NEGATIVE NEGATIVE Final    Comment: (NOTE) SARS-CoV-2 target nucleic acids are NOT DETECTED.  The SARS-CoV-2 RNA is generally detectable in upper respiratory specimens during the acute phase  of infection. The lowest concentration of SARS-CoV-2 viral copies this assay can detect is 138 copies/mL. A negative result does not preclude SARS-Cov-2 infection and should not be used as the sole basis for treatment or other patient management decisions. A negative result may occur with  improper specimen collection/handling, submission of specimen other than nasopharyngeal swab, presence of viral mutation(s) within the areas targeted by this assay, and inadequate number of viral copies(<138 copies/mL). A negative result must be combined with clinical observations, patient history, and epidemiological information. The expected result is Negative.  Fact Sheet for Patients:  EntrepreneurPulse.com.au  Fact Sheet for Healthcare Providers:  IncredibleEmployment.be  This test is no t yet approved or cleared by the [place] FDA and  has been authorized for detection and/or diagnosis of SARS-CoV-2 by FDA under an Emergency Use Authorization (EUA). This EUA will remain  in effect (meaning this test can be used) for the duration of the COVID-19 declaration under Section 564(b)(1) of the Act, 21 U.S.C.section 360bbb-3(b)(1), unless the authorization is terminated  or revoked sooner.       Influenza A by PCR NEGATIVE NEGATIVE Final   Influenza B by PCR NEGATIVE NEGATIVE Final    Comment: (NOTE) The Xpert Xpress SARS-CoV-2/FLU/RSV plus assay is intended as an aid in the diagnosis of influenza from Nasopharyngeal swab specimens and should not be used as a sole basis for treatment. Nasal washings and aspirates are unacceptable for Xpert Xpress SARS-CoV-2/FLU/RSV testing.  Fact Sheet for  Patients: EntrepreneurPulse.com.au  Fact Sheet for Healthcare Providers: IncredibleEmployment.be  This test is not yet approved or cleared by the [place] FDA and has been authorized for detection and/or diagnosis of SARS-CoV-2 by FDA under an Emergency Use Authorization (EUA). This EUA will remain in effect (meaning this test can be used) for the duration of the COVID-19 declaration under Section 564(b)(1) of the Act, 21 U.S.C. section 360bbb-3(b)(1), unless the authorization is terminated or revoked.  Performed at [facility], [place] [address] [place], [place] [address]   Culture, blood (routine x 2)     Status: None (Preliminary result)   Collection Time: 03/09/21  1:05 PM   Specimen: BLOOD  Result Value Ref Range Status   Specimen Description   Final    BLOOD RIGHT ANTECUBITAL Performed at [facility] [address] [place], [place] [address]    Special Requests   Final    BOTTLES DRAWN AEROBIC AND ANAEROBIC Blood Culture results may not be optimal due to an inadequate volume of blood received in culture bottles Performed at [facility] [address], [place], [place] [address]    Culture   Final    NO GROWTH 2 DAYS Performed at [facility] [address] [place], [place] [address]    Report Status PENDING  Incomplete     Labs: Basic Metabolic Panel: Recent Labs  Lab 03/09/21 1300 03/10/21 0704 03/11/21 0550  NA 139 135 133*  K 4.2 3.9 3.7  CL 104 101 102  CO2 26 25 25   GLUCOSE 147* 151* 93  BUN 9 11 15   CREATININE 0.56 0.57 0.64  CALCIUM 9.5 9.3 9.0   Liver Function Tests: Recent Labs  Lab 03/09/21 1300  AST 18  ALT 14  ALKPHOS 48  BILITOT 0.3  PROT 7.0  ALBUMIN 4.1   Recent Labs  Lab 03/09/21 1300  LIPASE 20   No results for input(s): AMMONIA in the last 168 hours. CBC: Recent Labs  Lab 03/09/21 1300 03/10/21 0704 03/11/21 0550   WBC 8.2 12.6* 13.9*  NEUTROABS 7.3  --   --   HGB 13.9 13.4 13.2  HCT 43.1 40.7 40.1  MCV 92.9 90.0 89.9  PLT 230 228 185   Cardiac Enzymes: No results for input(s): CKTOTAL, CKMB, CKMBINDEX, TROPONINI in the last 168 hours. BNP: BNP (last 3 results) Recent Labs    03/09/21 1300  BNP 66.0    ProBNP (last 3 results) No results for input(s): PROBNP in the last 8760 hours.  CBG: Recent Labs  Lab 03/10/21 1703 03/10/21 2115 03/11/21 0734 03/11/21 1158  GLUCAP 145* 122* 94 143*       Signed:  Kayleen Memos, MD [facility] 03/11/2021, 2:38 PM

## 2021-03-14 ENCOUNTER — Encounter: Payer: Self-pay | Admitting: Family Medicine

## 2021-03-14 ENCOUNTER — Ambulatory Visit: Payer: BC Managed Care – PPO | Admitting: Family Medicine

## 2021-03-14 ENCOUNTER — Other Ambulatory Visit: Payer: Self-pay

## 2021-03-14 VITALS — BP 132/80 | Ht 65.0 in

## 2021-03-14 DIAGNOSIS — Z720 Tobacco use: Secondary | ICD-10-CM

## 2021-03-14 DIAGNOSIS — J449 Chronic obstructive pulmonary disease, unspecified: Secondary | ICD-10-CM

## 2021-03-14 DIAGNOSIS — K219 Gastro-esophageal reflux disease without esophagitis: Secondary | ICD-10-CM

## 2021-03-14 DIAGNOSIS — J4489 Other specified chronic obstructive pulmonary disease: Secondary | ICD-10-CM

## 2021-03-14 DIAGNOSIS — J310 Chronic rhinitis: Secondary | ICD-10-CM | POA: Diagnosis not present

## 2021-03-14 LAB — CULTURE, BLOOD (ROUTINE X 2)
Culture: NO GROWTH
Culture: NO GROWTH

## 2021-03-14 MED ORDER — MONTELUKAST SODIUM 10 MG PO TABS: 10.0000 mg | ORAL_TABLET | Freq: Every day | ORAL | 5 refills | Status: DC

## 2021-03-14 MED ORDER — ALBUTEROL SULFATE (2.5 MG/3ML) 0.083% IN NEBU: 2.5000 mg | INHALATION_SOLUTION | RESPIRATORY_TRACT | 0 refills | Status: DC | PRN

## 2021-03-14 MED ORDER — CETIRIZINE HCL 10 MG PO TABS: 10.0000 mg | ORAL_TABLET | Freq: Every day | ORAL | 5 refills | Status: DC | PRN

## 2021-03-14 MED ORDER — BUDESONIDE-FORMOTEROL FUMARATE 160-4.5 MCG/ACT IN AERO: 2.0000 | INHALATION_SPRAY | Freq: Two times a day (BID) | RESPIRATORY_TRACT | 5 refills | Status: DC

## 2021-03-14 NOTE — Patient Instructions (Addendum)
Asthma Continue montelukast 10 mg once a day to prevent cough or wheeze Restart Symbicort 160-2 puffs twice a day with a spacer to prevent cough or wheeze. Rinse your mouth and spit after each use. Call the clinic if you experience thrush Stop Breo at this time  Stop taking prednisone 5 mg tablets. Tomorrow begin prednisone 10 mg tablets: take 2 tablets once a day for 4 days, then take 1 tablet on the 5th day, then stop Continue albuterol 2 puffs every 4 hours as needed for cough or wheeze OR Instead use albuterol 0.083% solution via nebulizer one unit vial every 4 hours as needed for cough or wheeze  Chronic rhinitis Restart cetirizine 10 mg once a day as needed for runny nose or itch Begin Flonase 2 sprays in each nostril once a day as needed for stuffy nose.  In the right nostril, point the applicator out toward the right ear. In the left nostril, point the applicator out toward the left ear Consider saline nasal rinses as needed for nasal symptoms. Use this before any medicated nasal sprays for best result  Reflux Begin omeprazole 20 mg once a day to prevent reflux Continue dietary and lifestyle modifications as listed below  Tobacco use Try to cut down and quit smoking Call the clinic if this treatment plan is not working well for you  Follow up in 2 weeks or sooner if needed.  Lifestyle Changes for Controlling GERD When you have GERD, stomach acid feels as if it's backing up toward your mouth. Whether or not you take medication to control your GERD, your symptoms can often be improved with lifestyle changes.   Raise Your Head  Reflux is more likely to strike when you're lying down flat, because stomach fluid can  flow backward more easily. Raising the head of your bed 4-6 inches can help. To do this:  Slide blocks or books under the legs at the head of your bed. Or, place a wedge under  the mattress. Many foam stores can make a suitable wedge for you. The wedge  should run  from your waist to the top of your head.  Don't just prop your head on several pillows. This increases pressure on your  stomach. It can make GERD worse.  Watch Your Eating Habits Certain foods may increase the acid in your stomach or relax the lower esophageal sphincter, making GERD more likely. It's best to avoid the following:  Coffee, tea, and carbonated drinks (with and without caffeine)  Fatty, fried, or spicy food  Mint, chocolate, onions, and tomatoes  Any other foods that seem to irritate your stomach or cause you pain  Relieve the Pressure  Eat smaller meals, even if you have to eat more often.  Don't lie down right after you eat. Wait a few hours for your stomach to empty.  Avoid tight belts and tight-fitting clothes.  Lose excess weight.  Tobacco and Alcohol  Avoid smoking tobacco and drinking alcohol. They can make GERD symptoms worse.

## 2021-03-14 NOTE — Progress Notes (Signed)
Sarah Reeves [id number] Dept: [phone]  FOLLOW UP NOTE  Patient ID: Sarah Reeves, female    DOB: Dec 15, 1960  Age: 60 y.o. MRN: [id number] Date of Office Visit: 03/14/2021  Assessment  Chief Complaint: Asthma (On the 16th she had a cold /cough had a zpack sent in by her PCP. She went to the urgent care last week her 02 level was 86% she had a infection of the lungs and had an asthma attack she could not get over. )  HPI Sarah Reeves is a 60 year old female who presents to the clinic for a follow-up visit.  She was last seen in this clinic on 10/31/2020 by Dr. 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  Her last skin testing was 08/08/2020 and was negative to the adult environmental panel as well as negative to the adult food panel.  In the interim, she was recently diagnosed with pneumonia for which she took azithromycin with no improvement of her symptoms.  She presented to her primary care doctor with increasing shortness of breath and oxygen saturation at 89% where she was treated with DuoNeb and Decadron 12 mg IM and placed on 2 L nasal cannula.  She was transported to the ED where she had a negative chest x-ray, and negative blood cultures, negative respiratory panel including COVID, influenza A, and influenza B.  She was admitted to the [facility] medical unit that day.  She was discharged from the hospital with a prednisone taper, Breo 200-2 puffs once a day, and albuterol nebulizer treatments as needed.  At today's visit, she reports that her asthma has been better controlled with some shortness of breath with activity, occasional wheeze, and slight cough sometimes dry and sometimes producing clear mucus.  She reports that, she had been using Symbicort 160-2 puffs twice a day with a spacer, however, reports that she has had symptoms consistent with thrush and so she quit using Symbicort a few months  ago.  She reports that she had been out of albuterol and had been using her husband's albuterol as needed.  After looking at the chart with pictures of different inhalers, she concludes that she has ProAir, however, she reports she has been out of Ventolin.  She reports ProAir does not work and Ventolin relieves her symptoms.  She reports that she continues montelukast 10 mg once a day.  Since her discharge from the hospital she reports that she is using Breo 200-1 puff once a day in addition to Symbicort 160-2 puffs twice a day.  She has been using her albuterol via nebulizer twice a day on a regular schedule.  She was discharged from the hospital with a prednisone taper with 30 tablets that are 5 mg each with directions for a taper calling for 42 tablets. Chronic rhinitis is reported as moderately well controlled with slight clear rhinorrhea, occasional nasal congestion, frequent sneezing, and decrease in postnasal drainage from earlier in the week.  She reports that she has been out of cetirizine for the last several weeks and she does have a nasal saline rinse, however she is not using that at this time.  She is not currently using Flonase.  Reflux is reported as poorly controlled with heartburn occurring almost every night.  She is not taking any medication to control reflux at this time.  She continues to smoke 10 cigarettes a day and reports that she has recently increased her cigarette  use. The benefits of smoking cessation were thoroughly discussed. Interestingly, she reports her husband has recently been diagnosed with lung cancer.  Her current medications are listed in the cart.  Of note, she reports that she would rather continue Symbicort 160-2 puffs twice a day with a spacer than continue Breo 200. She reports that, even though Memory Dance is once a day dosing , it makes her throat sore.   Drug Allergies:  Allergies  Allergen Reactions  . Oysters [Shellfish Allergy] Other (See Comments)    Unknown  reaction; "MD told me I was allergic"  . Zoloft [Sertraline Hcl] Other (See Comments)    REACTION: Nightmares, Grind teeth really bad  . Latex Itching    Itch and blisters from contact  . Amitriptyline     Hot, itching, tongue tingling, breakthrough bleeding.  . Clindamycin Other (See Comments)  . Clindamycin/Lincomycin Hives and Other (See Comments)    Stated that this causes her to get really horse and made her feel really hot  . Other Other (See Comments)  . Penicillins   . Pentosan Polysulfate Sodium Swelling    Throat swelling  . Sulfa Antibiotics Other (See Comments)  . Topamax [Topiramate]     Says it makes her "constipation and she can't urinate normally"  . Uribel [Meth-Hyo-M Bl-Na Phos-Ph Sal] Nausea Only  . Uribel [Urelle] Swelling and Other (See Comments)    Patient stated this makes her stomach swell and makes her feel si  . Lamotrigine Rash  . Sulfamethoxazole-Trimethoprim Rash    Patient developed feelings of dizziness, rash and itchy hands.  Patient then vomited and had diarrhea.    Physical Exam: BP 132/80   Ht 5\' 5"  (1.651 m)   SpO2 95%   BMI 26.89 kg/m    Physical Exam Vitals reviewed.  Constitutional:      Appearance: Normal appearance.  HENT:     Head: Normocephalic and atraumatic.     Right Ear: Tympanic membrane normal.     Left Ear: Tympanic membrane normal.     Nose:     Comments: Bilateral nares slightly erythematous with clear nasal drainage noted.  Pharynx slightly erythematous with no exudate.  Ears normal.  Eyes normal. Eyes:     Conjunctiva/sclera: Conjunctivae normal.  Cardiovascular:     Rate and Rhythm: Normal rate and regular rhythm.     Heart sounds: Normal heart sounds. No murmur heard.   Pulmonary:     Effort: Pulmonary effort is normal.     Comments: Bilateral scattered rhonchi which clears with coughing.  No wheezing noted at today's visit Musculoskeletal:     Cervical back: Normal range of motion and neck supple.      Comments: Bilateral hand tremor noted  Skin:    General: Skin is warm and dry.  Neurological:     Mental Status: She is alert and oriented to person, place, and time.  Psychiatric:        Mood and Affect: Mood normal.        Behavior: Behavior normal.        Thought Content: Thought content normal.        Judgment: Judgment normal.     Diagnostics: FVC 3.26, FEV1 2.27.  Predicted FVC 3.62, predicted FEV1 2.82.  Spirometry indicates normal ventilatory function.  Postbronchodilator FVC 3.03, FEV1 2.17.  Postbronchodilator spirometry indicates no significant bronchodilator response.   Assessment and Plan: 1. Asthma-COPD overlap syndrome ([place])   2. Chronic rhinitis   3. Gastroesophageal reflux disease,  unspecified whether esophagitis present   4. Tobacco use     Meds ordered this encounter  Medications  . budesonide-formoterol (SYMBICORT) 160-4.5 MCG/ACT inhaler    Sig: Inhale 2 puffs into the lungs 2 (two) times daily.    Dispense:  1 each    Refill:  5  . cetirizine (ZYRTEC) 10 MG tablet    Sig: Take 1 tablet (10 mg total) by mouth daily as needed for allergies.    Dispense:  31 tablet    Refill:  5  . albuterol (PROVENTIL) (2.5 MG/3ML) 0.083% nebulizer solution    Sig: Take 3 mLs (2.5 mg total) by nebulization every 2 (two) hours as needed for shortness of breath.    Dispense:  75 mL    Refill:  0  . montelukast (SINGULAIR) 10 MG tablet    Sig: Take 1 tablet (10 mg total) by mouth at bedtime.    Dispense:  30 tablet    Refill:  5    Patient Instructions  Asthma Continue montelukast 10 mg once a day to prevent cough or wheeze Restart Symbicort 160-2 puffs twice a day with a spacer to prevent cough or wheeze. Rinse your mouth and spit after each use. Call the clinic if you experience thrush Stop Breo at this time  Stop taking prednisone 5 mg tablets. Tomorrow begin prednisone 10 mg tablets: take 2 tablets once a day for 4 days, then take 1 tablet on the 5th day, then  stop Continue albuterol 2 puffs every 4 hours as needed for cough or wheeze OR Instead use albuterol 0.083% solution via nebulizer one unit vial every 4 hours as needed for cough or wheeze  Chronic rhinitis Restart cetirizine 10 mg once a day as needed for runny nose or itch Begin Flonase 2 sprays in each nostril once a day as needed for stuffy nose.  In the right nostril, point the applicator out toward the right ear. In the left nostril, point the applicator out toward the left ear Consider saline nasal rinses as needed for nasal symptoms. Use this before any medicated nasal sprays for best result  Reflux Begin omeprazole 20 mg once a day to prevent reflux Continue dietary and lifestyle modifications as listed below  Tobacco use Try to cut down and quit smoking Call the clinic if this treatment plan is not working well for you  Follow up in 2 weeks or sooner if needed.   Return in about 2 weeks (around 03/28/2021), or if symptoms worsen or fail to improve.    Thank you for the opportunity to care for this patient.  Please do not hesitate to contact me with questions.  Gareth Morgan, FNP [facility] [place]

## 2021-03-18 NOTE — Progress Notes (Deleted)
Cardiology Office Note:    Date:  03/18/2021   ID:  Sarah Reeves, Sarah Reeves January 05, 1961, MRN [id number]  PCP:  Dian Queen, MD  Cardiologist:  Glenetta Hew, MD  Electrophysiologist:  None   Referring MD: Dian Queen, MD   Chief Complaint: routine follow-up of chronic atypical chest pain  History of Present Illness:    Sarah Reeves is a 60 y.o. female with a history of chronic atypical chest pain with low risk Myoview in 2016, asthma, hypertension, hyperlipidemia, GERD, Hepatitis C, fibromyalgia, and anxiety/depression who is followed by Dr. Ellyn Hack and presents today for routine follow-up.   Patient was referred back to Dr. Ellyn Hack in 06/2015 after not having been seen in several year for further evaluation of chest pain with both typical and atypical features. Lexiscan Myoview was ordered for further evaluation and was low risk with some minimal breast attenuation but no evidence of ischemia. She has continued to have atypical chest pain since that time that is not associated with exertion. These episodes have been felt to be related to anxiety and panic attacks. Patient was last seen by Kerin Ransom, PA-C, for a virtual visit in 12/2019 at which time she continued to report occasional chest pain at rest that sometime would improve with Aspirin and Nitro. However, she reported using the Elliptical every day for 15-20 minutes and having no chest pain with this. Chest pain was not felt to be due to ischemia an no further work-up was recommended.   Since last visit, she was recently admitted from 03/09/2021 to 03/11/2021 for acute hypoxic respiratory failure due to asthma exacerbation and recent pneumonia. She was treated with steroids and breathing treatments. She was discharged Prednisone with 10 day taper as well as home bronchodilators and nebulizers.  Patient presents today for follow-up. ***  Chronic Atypical Chest Pain - Patient has a history of chronic atypical chest pain at rest.  Anxiety has been felt to be a contributing factor. Myoview in 2016 was low risk with no evidence of ischemia. - ***  Hypertension - *** - Continue Triamterene-HCTZ 37.5-25mg  daily.  Hyperlipidemia - Last lipid panel from 10/2018: Total Cholesterol 223, Triglycerides 125, HDL 78, LDL 120.  - Not on any medications at home.  - Will repeat lipid panel. *** Recent CMET showed normal LFTs.   Past Medical History:  Diagnosis Date  . Angio-edema   . Anxiety   . Asthma   . Complication of anesthesia    takes alot to sedated  . Depression   . Fibromyalgia   . GERD (gastroesophageal reflux disease)   . Hepatitis C    (Dr. Earlean Shawl) Treated with Harvoni March-May 2016  . Herpes simplex   . Hyperlipemia   . Insomnia   . Mental disorder   . Neuropathy   . PONV (postoperative nausea and vomiting)   . Urticaria     Past Surgical History:  Procedure Laterality Date  . BREAST ENHANCEMENT SURGERY    . BREAST LUMPECTOMY WITH RADIOACTIVE SEED LOCALIZATION Left 12/26/2014   Procedure: LEFT BREAST LUMPECTOMY WITH RADIOACTIVE SEED LOCALIZATION;  Surgeon: Autumn Messing III, MD;  Location: [facility];  Service: General;  Laterality: Left;  . BREAST REDUCTION SURGERY    . COLONOSCOPY    . EXPLORATORY LAPAROTOMY    . NM MYOVIEW LTD  07/07/15   Normal Myocardial Perfusion Scan. Low risk lexiscan nuclear study with minimal insignificant breast attenuation and normal myocardial perfusion and function; EF 53% without wall motion abnormalities and  normal systolic thickening  . TEMPOROMANDIBULAR JOINT SURGERY    . TUBAL LIGATION    . UPPER GI ENDOSCOPY     x5    Current Medications: No outpatient medications have been marked as taking for the 03/22/21 encounter (Appointment) with Darreld Mclean, PA-C.     Allergies:   Oysters [shellfish allergy], Zoloft [sertraline hcl], Latex, Amitriptyline, Clindamycin, Clindamycin/lincomycin, Other, Penicillins, Pentosan polysulfate sodium, Sulfa  antibiotics, Topamax [topiramate], Uribel [meth-hyo-m bl-na phos-ph sal], Uribel [urelle], Lamotrigine, and Sulfamethoxazole-trimethoprim   Social History   Socioeconomic History  . Marital status: Married    Spouse name: Not on file  . Number of children: 0  . Years of education: 74  . Highest education level: Not on file  Occupational History  . Occupation: Retired  Tobacco Use  . Smoking status: Current Every Day Smoker    Packs/day: 0.50    Types: Cigarettes  . Smokeless tobacco: Never Used  Vaping Use  . Vaping Use: Never used  Substance and Sexual Activity  . Alcohol use: No  . Drug use: No    Comment: did have hx drug abuse as teen  . Sexual activity: Yes    Birth control/protection: Surgical  Other Topics Concern  . Not on file  Social History Narrative   Patient is right handed.   Four cups caffeine per day.   Lives at home with husband.   Social Determinants of Health   Financial Resource Strain: Not on file  Food Insecurity: Not on file  Transportation Needs: Not on file  Physical Activity: Not on file  Stress: Not on file  Social Connections: Not on file     Family History: The patient's family history includes Alzheimer's disease in her maternal aunt and maternal grandmother; Breast cancer in her mother; Colon cancer in her father; Diabetes in her mother; Heart disease in her father and mother; Hypothyroidism in her brother; Lung disease in her mother; Skin cancer in her maternal aunt.  ROS:   Please see the history of present illness.     EKGs/Labs/Other Studies Reviewed:    The following studies were reviewed today:  Lexiscan Myoview 07/07/2015:  Nuclear stress EF: 53%.  The left ventricular ejection fraction is mildly decreased (45-54%).  There was no ST segment deviation noted during stress.  Normal myocardial perfusion and function.   Low risk lexiscan nuclear study with minimal insignificant breast attenuation and normal myocardial  perfusion and function; EF 53% without wall motion abnormalities and normal systolic thickening.   EKG:  EKG ordered today. EKG personally reviewed and demonstrates ***.  Recent Labs: 03/09/2021: ALT 14; B Natriuretic Peptide 66.0 03/11/2021: BUN 15; Creatinine, Ser 0.64; Hemoglobin 13.2; Platelets 185; Potassium 3.7; Sodium 133  Recent Lipid Panel    Component Value Date/Time   CHOL 223 (H) 11/10/2018 1014   TRIG 125 11/10/2018 1014   HDL 78 11/10/2018 1014   CHOLHDL 2.9 11/10/2018 1014   CHOLHDL 3.3 06/27/2018 0623   VLDL 23 06/27/2018 0623   LDLCALC 120 (H) 11/10/2018 1014    Physical Exam:    Vital Signs: There were no vitals taken for this visit.    Wt Readings from Last 3 Encounters:  03/10/21 161 lb 9.6 oz (73.3 kg)  10/31/20 154 lb (69.9 kg)  06/13/20 154 lb 12.8 oz (70.2 kg)     General: 60 y.o. female in no acute distress. HEENT: Normocephalic and atraumatic. Sclera clear. EOMs intact. Neck: Supple. No carotid bruits. No JVD. Heart: *** RRR.  Distinct S1 and S2. No murmurs, gallops, or rubs. Radial and distal pedal pulses 2+ and equal bilaterally. Lungs: No increased work of breathing. Clear to ausculation bilaterally. No wheezes, rhonchi, or rales.  Abdomen: Soft, non-distended, and non-tender to palpation. Bowel sounds present in all 4 quadrants.  MSK: Normal strength and tone for age. *** Extremities: No lower extremity edema.    Skin: Warm and dry. Neuro: Alert and oriented x3. No focal deficits. Psych: Normal affect. Responds appropriately.   Assessment:    No diagnosis found.  Plan:     Disposition: Follow up in ***   Medication Adjustments/Labs and Tests Ordered: Current medicines are reviewed at length with the patient today.  Concerns regarding medicines are outlined above.  No orders of the defined types were placed in this encounter.  No orders of the defined types were placed in this encounter.   There are no Patient Instructions on file  for this visit.   Signed, Darreld Mclean, PA-C  03/18/2021 7:38 PM    [facility]

## 2021-03-20 DIAGNOSIS — F339 Major depressive disorder, recurrent, unspecified: Secondary | ICD-10-CM | POA: Diagnosis not present

## 2021-03-20 DIAGNOSIS — F411 Generalized anxiety disorder: Secondary | ICD-10-CM | POA: Diagnosis not present

## 2021-03-20 DIAGNOSIS — F410 Panic disorder [episodic paroxysmal anxiety] without agoraphobia: Secondary | ICD-10-CM | POA: Diagnosis not present

## 2021-03-20 DIAGNOSIS — F4010 Social phobia, unspecified: Secondary | ICD-10-CM | POA: Diagnosis not present

## 2021-03-22 ENCOUNTER — Ambulatory Visit: Payer: BC Managed Care – PPO | Admitting: Student

## 2021-03-28 ENCOUNTER — Ambulatory Visit: Payer: BC Managed Care – PPO | Admitting: Family Medicine

## 2021-03-28 NOTE — Progress Notes (Deleted)
   [place] [address] Dept: [phone]  FOLLOW UP NOTE  Patient ID: BRYANT LIPPS, female    DOB: July 12, 1961  Age: 60 y.o. MRN: [id number] Date of Office Visit: 03/28/2021  Assessment  Chief Complaint: No chief complaint on file.  HPI Jazmine Heckman Saltos    Drug Allergies:  Allergies  Allergen Reactions  . Oysters [Shellfish Allergy] Other (See Comments)    Unknown reaction; "MD told me I was allergic"  . Zoloft [Sertraline Hcl] Other (See Comments)    REACTION: Nightmares, Grind teeth really bad  . Latex Itching    Itch and blisters from contact  . Amitriptyline     Hot, itching, tongue tingling, breakthrough bleeding.  . Clindamycin Other (See Comments)  . Clindamycin/Lincomycin Hives and Other (See Comments)    Stated that this causes her to get really horse and made her feel really hot  . Other Other (See Comments)  . Penicillins   . Pentosan Polysulfate Sodium Swelling    Throat swelling  . Sulfa Antibiotics Other (See Comments)  . Topamax [Topiramate]     Says it makes her "constipation and she can't urinate normally"  . Uribel [Meth-Hyo-M Bl-Na Phos-Ph Sal] Nausea Only  . Uribel [Urelle] Swelling and Other (See Comments)    Patient stated this makes her stomach swell and makes her feel si  . Lamotrigine Rash  . Sulfamethoxazole-Trimethoprim Rash    Patient developed feelings of dizziness, rash and itchy hands.  Patient then vomited and had diarrhea.    Physical Exam: There were no vitals taken for this visit.   Physical Exam  Diagnostics:    Assessment and Plan: No diagnosis found.  No orders of the defined types were placed in this encounter.   There are no Patient Instructions on file for this visit.  No follow-ups on file.    Thank you for the opportunity to care for this patient.  Please do not hesitate to contact me with questions.  Gareth Morgan, FNP [facility] [place]

## 2021-04-04 DIAGNOSIS — F4010 Social phobia, unspecified: Secondary | ICD-10-CM | POA: Diagnosis not present

## 2021-04-04 DIAGNOSIS — F410 Panic disorder [episodic paroxysmal anxiety] without agoraphobia: Secondary | ICD-10-CM | POA: Diagnosis not present

## 2021-04-04 DIAGNOSIS — F411 Generalized anxiety disorder: Secondary | ICD-10-CM | POA: Diagnosis not present

## 2021-04-04 DIAGNOSIS — F339 Major depressive disorder, recurrent, unspecified: Secondary | ICD-10-CM | POA: Diagnosis not present

## 2021-04-04 NOTE — Patient Instructions (Addendum)
Asthma Continue montelukast 10 mg once a day to prevent cough or wheeze Continue Symbicort 160-2 puffs twice a day with a spacer to prevent cough or wheeze. Rinse your mouth and spit after each use. Call the clinic if you experience thrush Continue albuterol 2 puffs every 4 hours as needed for cough or wheeze OR Instead use albuterol 0.083% solution via nebulizer one unit vial every 4 hours as needed for cough or wheeze You may use albuterol 2 puffs 5-15 minutes before activity to decrease cough or wheeze  Chronic rhinitis Continue cetirizine 10 mg once a day as needed for runny nose or itch Continue Flonase 2 sprays in each nostril once a day as needed for stuffy nose.  In the right nostril, point the applicator out toward the right ear. In the left nostril, point the applicator out toward the left ear Consider saline nasal rinses as needed for nasal symptoms. Use this before any medicated nasal sprays for best result  Reflux Begin omeprazole 40 mg once a day to prevent reflux Continue dietary and lifestyle modifications as listed below  Tobacco use Try to cut down and quit smoking Call the clinic if this treatment plan is not working well for you  Follow up in 4 months or sooner if needed.  Lifestyle Changes for Controlling GERD When you have GERD, stomach acid feels as if it's backing up toward your mouth. Whether or not you take medication to control your GERD, your symptoms can often be improved with lifestyle changes.   Raise Your Head Reflux is more likely to strike when you're lying down flat, because stomach fluid can flow backward more easily. Raising the head of your bed 4-6 inches can help. To do this: Slide blocks or books under the legs at the head of your bed. Or, place a wedge under the mattress. Many foam stores can make a suitable wedge for you. The wedge should run from your waist to the top of your head. Don't just prop your head on several pillows. This increases  pressure on your stomach. It can make GERD worse.  Watch Your Eating Habits Certain foods may increase the acid in your stomach or relax the lower esophageal sphincter, making GERD more likely. It's best to avoid the following: Coffee, tea, and carbonated drinks (with and without caffeine) Fatty, fried, or spicy food Mint, chocolate, onions, and tomatoes Any other foods that seem to irritate your stomach or cause you pain  Relieve the Pressure Eat smaller meals, even if you have to eat more often. Don't lie down right after you eat. Wait a few hours for your stomach to empty. Avoid tight belts and tight-fitting clothes. Lose excess weight.  Tobacco and Alcohol Avoid smoking tobacco and drinking alcohol. They can make GERD symptoms worse.

## 2021-04-04 NOTE — Progress Notes (Signed)
[place] [address] Dept: [phone]  FOLLOW UP NOTE  Patient ID: Sarah Reeves, female    DOB: 19-Jul-1961  Age: 60 y.o. MRN: [id number] Date of Office Visit: 04/05/2021  Assessment  Chief Complaint: Asthma (States no issues, says it is better than before.)  HPI Sarah Reeves is a 60 year old female who presents the clinic for follow-up visit.  She was last seen in this clinic on 03/14/2021 for evaluation of asthma, chronic rhinitis, reflux, and tobacco use.  At today's visit, she reports her asthma has been much more well controlled than at her last visit with symptoms including occasional wheeze at night and cough producing phlegm occurring in the morning.  She denies shortness of breath with activity and rest.  She continues montelukast 10 mg once a day, Symbicort 160-2 puffs twice a day and is using albuterol 2 puffs once a day as a habit.  Allergic rhinitis is reported as moderately well controlled with occasional clear rhinorrhea and frequent post nasal drainage.  She continues cetirizine 10 mg once a day and is not currently using Flonase or saline nasal rinses.  Reflux is reported as poorly controlled with heartburn occurring frequently.  She is not currently taking a medication to control reflux however, she does report that she took her husband's prescription medication with relief of heartburn on a previous occasion.  She does not know the name of this medication.  She continues to smoke 10 cigarettes a day.  Her current medications are listed in the chart.   Drug Allergies:  Allergies  Allergen Reactions   Oysters [Shellfish Allergy] Other (See Comments)    Unknown reaction; "MD told me I was allergic"   Zoloft [Sertraline Hcl] Other (See Comments)    REACTION: Nightmares, Grind teeth really bad   Latex Itching    Itch and blisters from contact   Amitriptyline     Hot, itching, tongue tingling, breakthrough bleeding.   Clindamycin Other (See  Comments)   Clindamycin/Lincomycin Hives and Other (See Comments)    Stated that this causes her to get really horse and made her feel really hot   Other Other (See Comments)   Penicillins    Pentosan Polysulfate Sodium Swelling    Throat swelling   Sulfa Antibiotics Other (See Comments)   Topamax [Topiramate]     Says it makes her "constipation and she can't urinate normally"   Uribel [Meth-Hyo-M Bl-Na Phos-Ph Sal] Nausea Only   Uribel [Urelle] Swelling and Other (See Comments)    Patient stated this makes her stomach swell and makes her feel si   Lamotrigine Rash   Sulfamethoxazole-Trimethoprim Rash    Patient developed feelings of dizziness, rash and itchy hands.  Patient then vomited and had diarrhea.    Physical Exam: BP 130/82   Pulse (!) 111   Temp 98.5 F (36.9 C) (Temporal)   Resp 16   Ht 5\' 4"  (1.626 m)   Wt 157 lb 9.6 oz (71.5 kg)   SpO2 95%   BMI 27.05 kg/m    Physical Exam Vitals reviewed.  Constitutional:      Appearance: Normal appearance.  HENT:     Head: Normocephalic and atraumatic.     Right Ear: Tympanic membrane normal.     Left Ear: Tympanic membrane normal.     Nose:     Comments: Bilateral nares slightly erythematous with clear nasal drainage noted.  Pharynx normal.  Ears normal.  Eyes normal.    Mouth/Throat:  Pharynx: Oropharynx is clear.  Eyes:     Conjunctiva/sclera: Conjunctivae normal.  Cardiovascular:     Rate and Rhythm: Normal rate and regular rhythm.     Heart sounds: Normal heart sounds. No murmur heard. Pulmonary:     Effort: Pulmonary effort is normal.     Breath sounds: Normal breath sounds.     Comments: Lungs clear to auscultation Musculoskeletal:        General: Normal range of motion.     Cervical back: Normal range of motion and neck supple.  Skin:    General: Skin is warm and dry.  Neurological:     Mental Status: She is alert and oriented to person, place, and time.  Psychiatric:        Mood and Affect: Mood  normal.        Behavior: Behavior normal.        Thought Content: Thought content normal.        Judgment: Judgment normal.    Diagnostics: FVC 3.20, FEV1 2.23.  Predicted FVC 3.38, predicted FEV1 2.63.  Spirometry indicates normal ventilatory function  Assessment and Plan: 1. Asthma-COPD overlap syndrome ([place])   2. Tobacco use   3. Chronic rhinitis   4. Gastroesophageal reflux disease, unspecified whether esophagitis present     Meds ordered this encounter  Medications   montelukast (SINGULAIR) 10 MG tablet    Sig: Take 1 tablet (10 mg total) by mouth at bedtime.    Dispense:  30 tablet    Refill:  5   levalbuterol (XOPENEX) 1.25 MG/3ML nebulizer solution    Sig: Take 1.25 mg by nebulization every 4 (four) hours as needed for wheezing.    Dispense:  72 mL    Refill:  5   albuterol (PROVENTIL) (2.5 MG/3ML) 0.083% nebulizer solution    Sig: Take 3 mLs (2.5 mg total) by nebulization every 2 (two) hours as needed for shortness of breath.    Dispense:  75 mL    Refill:  0   RABEprazole (ACIPHEX) 20 MG tablet    Sig: Take 1 tablet (20 mg total) by mouth daily.    Dispense:  31 tablet    Refill:  1     Patient Instructions  Asthma Continue montelukast 10 mg once a day to prevent cough or wheeze Continue Symbicort 160-2 puffs twice a day with a spacer to prevent cough or wheeze. Rinse your mouth and spit after each use. Call the clinic if you experience thrush Continue albuterol 2 puffs every 4 hours as needed for cough or wheeze OR Instead use albuterol 0.083% solution via nebulizer one unit vial every 4 hours as needed for cough or wheeze You may use albuterol 2 puffs 5-15 minutes before activity to decrease cough or wheeze  Chronic rhinitis Continue cetirizine 10 mg once a day as needed for runny nose or itch Continue Flonase 2 sprays in each nostril once a day as needed for stuffy nose.  In the right nostril, point the applicator out toward the right ear. In the left  nostril, point the applicator out toward the left ear Consider saline nasal rinses as needed for nasal symptoms. Use this before any medicated nasal sprays for best result  Reflux Begin omeprazole 40 mg once a day to prevent reflux Continue dietary and lifestyle modifications as listed below  Tobacco use Try to cut down and quit smoking. Information provided from Up-To-Date  Call the clinic if this treatment plan is not working well for  you  Follow up in 4 months or sooner if needed.   Return in about 4 months (around 08/05/2021), or if symptoms worsen or fail to improve.    Thank you for the opportunity to care for this patient.  Please do not hesitate to contact me with questions.  Gareth Morgan, FNP [facility]

## 2021-04-05 ENCOUNTER — Other Ambulatory Visit: Payer: Self-pay

## 2021-04-05 ENCOUNTER — Ambulatory Visit: Payer: BC Managed Care – PPO | Admitting: Family Medicine

## 2021-04-05 ENCOUNTER — Encounter: Payer: Self-pay | Admitting: Family Medicine

## 2021-04-05 VITALS — BP 130/82 | HR 111 | Temp 98.5°F | Resp 16 | Ht 64.0 in | Wt 157.6 lb

## 2021-04-05 DIAGNOSIS — Z720 Tobacco use: Secondary | ICD-10-CM | POA: Insufficient documentation

## 2021-04-05 DIAGNOSIS — J449 Chronic obstructive pulmonary disease, unspecified: Secondary | ICD-10-CM | POA: Insufficient documentation

## 2021-04-05 DIAGNOSIS — J310 Chronic rhinitis: Secondary | ICD-10-CM | POA: Diagnosis not present

## 2021-04-05 DIAGNOSIS — K219 Gastro-esophageal reflux disease without esophagitis: Secondary | ICD-10-CM | POA: Diagnosis not present

## 2021-04-05 MED ORDER — RABEPRAZOLE SODIUM 20 MG PO TBEC: 20.0000 mg | DELAYED_RELEASE_TABLET | Freq: Every day | ORAL | 1 refills | Status: DC

## 2021-04-05 MED ORDER — LEVALBUTEROL HCL 1.25 MG/3ML IN NEBU: 1.2500 mg | INHALATION_SOLUTION | RESPIRATORY_TRACT | 5 refills | Status: DC | PRN

## 2021-04-05 MED ORDER — ALBUTEROL SULFATE (2.5 MG/3ML) 0.083% IN NEBU: 2.5000 mg | INHALATION_SOLUTION | RESPIRATORY_TRACT | 0 refills | Status: DC | PRN

## 2021-04-05 MED ORDER — MONTELUKAST SODIUM 10 MG PO TABS: 10.0000 mg | ORAL_TABLET | Freq: Every day | ORAL | 5 refills | Status: DC

## 2021-04-09 ENCOUNTER — Telehealth: Payer: Self-pay | Admitting: Family Medicine

## 2021-04-09 ENCOUNTER — Telehealth: Payer: Self-pay | Admitting: *Deleted

## 2021-04-09 ENCOUNTER — Other Ambulatory Visit: Payer: Self-pay

## 2021-04-09 MED ORDER — OMEPRAZOLE 40 MG PO CPDR
40.0000 mg | DELAYED_RELEASE_CAPSULE | Freq: Every day | ORAL | 5 refills | Status: DC
Start: 2021-04-09 — End: 2021-08-07

## 2021-04-09 NOTE — Telephone Encounter (Signed)
PA has been submitted through CoverMyMeds for Rabeprazole and is currently pending approval/denial.

## 2021-04-09 NOTE — Telephone Encounter (Signed)
Patient was told at last visit omeprazole would be sent in for her, however patient went to pick up medication and pharmacy had not received prescription. Patient is requesting that prescription be sent in as soon as possible as she is having stomach issues. Patient is requesting a call once prescription is sent in.   [facility] - [place]  Best contact number for patient: [phone]

## 2021-04-09 NOTE — Telephone Encounter (Signed)
Omeprazole 40mg  has been sent in to [facility] on [address] per patient request.

## 2021-04-09 NOTE — Telephone Encounter (Signed)
Called patient and she verbalized understanding that the omeprazole 40mg  has been sent into [facility].

## 2021-04-11 MED ORDER — OMEPRAZOLE 40 MG PO CPDR
40.0000 mg | DELAYED_RELEASE_CAPSULE | Freq: Every day | ORAL | 0 refills | Status: DC
Start: 2021-04-11 — End: 2021-08-07

## 2021-04-11 NOTE — Addendum Note (Signed)
Addended by: Felipa Emory on: 04/11/2021 02:52 PM   Modules accepted: Orders

## 2021-04-11 NOTE — Telephone Encounter (Signed)
Can you please order omeprazole 40 mg once a day for 30 days please? Thank you

## 2021-04-11 NOTE — Telephone Encounter (Signed)
Sent in omeprzole to last known pharmacy but it did say it was not covered so waiting to see if insurance will cover it

## 2021-04-11 NOTE — Telephone Encounter (Signed)
Pa was denied pt has to fail two otc generic ppi such as omeprazole or lansoprazole pt has only tried esomeprazole

## 2021-04-20 DIAGNOSIS — R102 Pelvic and perineal pain: Secondary | ICD-10-CM | POA: Diagnosis not present

## 2021-04-20 DIAGNOSIS — Z760 Encounter for issue of repeat prescription: Secondary | ICD-10-CM | POA: Diagnosis not present

## 2021-04-20 DIAGNOSIS — R309 Painful micturition, unspecified: Secondary | ICD-10-CM | POA: Diagnosis not present

## 2021-04-24 DIAGNOSIS — F411 Generalized anxiety disorder: Secondary | ICD-10-CM | POA: Diagnosis not present

## 2021-04-24 DIAGNOSIS — F4010 Social phobia, unspecified: Secondary | ICD-10-CM | POA: Diagnosis not present

## 2021-04-24 DIAGNOSIS — F339 Major depressive disorder, recurrent, unspecified: Secondary | ICD-10-CM | POA: Diagnosis not present

## 2021-04-24 DIAGNOSIS — F410 Panic disorder [episodic paroxysmal anxiety] without agoraphobia: Secondary | ICD-10-CM | POA: Diagnosis not present

## 2021-04-24 DIAGNOSIS — F909 Attention-deficit hyperactivity disorder, unspecified type: Secondary | ICD-10-CM | POA: Diagnosis not present

## 2021-04-30 ENCOUNTER — Telehealth: Payer: BC Managed Care – PPO

## 2021-04-30 MED ORDER — LEVALBUTEROL TARTRATE 45 MCG/ACT IN AERO: 2.0000 | INHALATION_SPRAY | RESPIRATORY_TRACT | 1 refills | Status: DC | PRN

## 2021-04-30 NOTE — Telephone Encounter (Signed)
Patient called stating the albuterol 90 mcg inhaler is not working well with her antidepressant. It's causing her to have tachycardia, headaches & jittery. Patient states she has been on ventolin before and didn't have any problems. She is wondering if she can be changed over to ventolin ?  Please Advise

## 2021-04-30 NOTE — Telephone Encounter (Signed)
Sent in medication and informed pt of this and verified pharmacy

## 2021-04-30 NOTE — Telephone Encounter (Signed)
Can you please order xopenex inhaler for this paitent? 2 puffs once every 6 hours as needed. This will replace albuterol. Thank you

## 2021-05-02 ENCOUNTER — Telehealth: Payer: Self-pay

## 2021-05-02 NOTE — Telephone Encounter (Signed)
Pa submitted thru cover my meds for levalbuterol hfa waiting on response

## 2021-05-02 NOTE — Telephone Encounter (Signed)
Patient states the pharmacy does not have her medication. Informed patient that a prior authorization is needed and has been submitted. Please update patient when answer comes back from prior authorization.

## 2021-05-02 NOTE — Telephone Encounter (Signed)
Just doubled checked and still have not heard anything back from insurance will keep an eye out

## 2021-05-03 DIAGNOSIS — I491 Atrial premature depolarization: Secondary | ICD-10-CM | POA: Diagnosis not present

## 2021-05-03 DIAGNOSIS — R000 Tachycardia, unspecified: Secondary | ICD-10-CM | POA: Diagnosis not present

## 2021-05-03 DIAGNOSIS — I10 Essential (primary) hypertension: Secondary | ICD-10-CM | POA: Diagnosis not present

## 2021-05-03 DIAGNOSIS — R457 State of emotional shock and stress, unspecified: Secondary | ICD-10-CM | POA: Diagnosis not present

## 2021-05-03 NOTE — Telephone Encounter (Signed)
Pa is pending still could take up to 72 hours to hear back

## 2021-05-03 NOTE — Telephone Encounter (Signed)
Patient called to follow up on prior authorization. Patient is requesting a call if it gets approved/denied.   Best contact number: [phone]  If denied, patient would like something else sent in for her. Patient is currently using her husband's inhaler, patient is aware she should not be using it.

## 2021-05-04 NOTE — Telephone Encounter (Signed)
Received letter stating that Levalbuterol was approved through 05/01/2022. Faxed to pharmacy. Pt informed. Letter placed in scanning.

## 2021-05-09 DIAGNOSIS — F339 Major depressive disorder, recurrent, unspecified: Secondary | ICD-10-CM | POA: Diagnosis not present

## 2021-05-09 DIAGNOSIS — F410 Panic disorder [episodic paroxysmal anxiety] without agoraphobia: Secondary | ICD-10-CM | POA: Diagnosis not present

## 2021-05-09 DIAGNOSIS — F411 Generalized anxiety disorder: Secondary | ICD-10-CM | POA: Diagnosis not present

## 2021-05-09 DIAGNOSIS — F4010 Social phobia, unspecified: Secondary | ICD-10-CM | POA: Diagnosis not present

## 2021-05-20 NOTE — Progress Notes (Deleted)
Cardiology Office Note   Date:  05/20/2021   ID:  Sarah Reeves, Sarah Reeves Sep 14, 1961, MRN [id number]  PCP:  Dian Queen, MD  Cardiologist:  Dr.Harding  No chief complaint on file.    History of Present Illness: Sarah Reeves is a 60 y.o. female who presents for ongoing assessment and management of chronic chest pain with a low risk Myoview in 2016.  She has other history of hypertension tobacco abuse and chronic anxiety.  She cares for her husband who has dementia.  When last seen by Kerin Ransom, PA on 12/27/2019 via telemedicine,  she continued to be active using an elliptical 10 to 20 minutes a day without chest pain.    Past Medical History:  Diagnosis Date   Angio-edema    Anxiety    Asthma    Complication of anesthesia    takes alot to sedated   Depression    Fibromyalgia    GERD (gastroesophageal reflux disease)    Hepatitis C    (Dr. Earlean Shawl) Treated with Harvoni March-May 2016   Herpes simplex    Hyperlipemia    Insomnia    Mental disorder    Neuropathy    PONV (postoperative nausea and vomiting)    Urticaria     Past Surgical History:  Procedure Laterality Date   BREAST ENHANCEMENT SURGERY     BREAST LUMPECTOMY WITH RADIOACTIVE SEED LOCALIZATION Left 12/26/2014   Procedure: LEFT BREAST LUMPECTOMY WITH RADIOACTIVE SEED LOCALIZATION;  Surgeon: Autumn Messing III, MD;  Location: [place];  Service: General;  Laterality: Left;   BREAST REDUCTION SURGERY     COLONOSCOPY     EXPLORATORY LAPAROTOMY     NM MYOVIEW LTD  07/07/15   Normal Myocardial Perfusion Scan. Low risk lexiscan nuclear study with minimal insignificant breast attenuation and normal myocardial perfusion and function; EF 53% without wall motion abnormalities and normal systolic thickening   TEMPOROMANDIBULAR JOINT SURGERY     TUBAL LIGATION     UPPER GI ENDOSCOPY     x5     Current Outpatient Medications  Medication Sig Dispense Refill   albuterol (PROVENTIL) (2.5 MG/3ML) 0.083%  nebulizer solution Take 3 mLs (2.5 mg total) by nebulization every 2 (two) hours as needed for shortness of breath. 75 mL 0   Ascorbic Acid (VITAMIN C) 1000 MG tablet Take 1,000 mg by mouth daily.     aspirin EC 81 MG tablet Take 1 tablet (81 mg total) by mouth daily. 90 tablet 3   budesonide-formoterol (SYMBICORT) 160-4.5 MCG/ACT inhaler Inhale 2 puffs into the lungs 2 (two) times daily. 10.2 g 0   cetirizine (ZYRTEC) 10 MG tablet Take 10 mg by mouth daily.     cholecalciferol (VITAMIN D) 1000 UNITS tablet Take 2,000 Units by mouth daily.      cyclobenzaprine (FLEXERIL) 10 MG tablet Take 10 mg by mouth 3 (three) times daily as needed for muscle spasms.     DULoxetine (CYMBALTA) 30 MG capsule Take 30 mg by mouth daily.     estradiol (VIVELLE-DOT) 0.05 MG/24HR patch Place 1 patch onto the skin 2 (two) times a week.     gabapentin (NEURONTIN) 300 MG capsule Take 300 mg by mouth daily as needed (nerve pain).     levalbuterol (XOPENEX HFA) 45 MCG/ACT inhaler Inhale 2 puffs into the lungs every 4 (four) hours as needed for wheezing. 15 g 1   levalbuterol (XOPENEX) 1.25 MG/3ML nebulizer solution Take 1.25 mg by nebulization every 4 (four) hours  as needed for wheezing. 72 mL 5   LORazepam (ATIVAN) 2 MG tablet Take 2 mg by mouth in the morning, at noon, and at bedtime.     meclizine (ANTIVERT) 25 MG tablet Take 25 mg by mouth 3 (three) times daily as needed for dizziness.     montelukast (SINGULAIR) 10 MG tablet Take 1 tablet (10 mg total) by mouth at bedtime. 30 tablet 5   nicotine (NICODERM CQ - DOSED IN MG/24 HOURS) 14 mg/24hr patch Place 1 patch (14 mg total) onto the skin daily. (Patient not taking: Reported on 04/05/2021) 28 patch 0   nitroGLYCERIN (NITROSTAT) 0.4 MG SL tablet Place 1 tablet (0.4 mg total) under the tongue every 5 (five) minutes as needed for chest pain. 25 tablet 1   omeprazole (PRILOSEC) 40 MG capsule Take 1 capsule (40 mg total) by mouth daily. 30 capsule 5   omeprazole (PRILOSEC)  40 MG capsule Take 1 capsule (40 mg total) by mouth daily. 30 capsule 0   Oxycodone HCl 10 MG TABS Take 10 mg by mouth every 4 (four) hours.     progesterone (PROMETRIUM) 100 MG capsule Take 100 mg by mouth at bedtime.     promethazine (PHENERGAN) 25 MG tablet Take 25 mg by mouth every 6 (six) hours as needed for nausea or vomiting.     RABEprazole (ACIPHEX) 20 MG tablet Take 1 tablet (20 mg total) by mouth daily. 31 tablet 1   triamterene-hydrochlorothiazide (DYAZIDE) 37.5-25 MG capsule Take 1 capsule by mouth daily.     valACYclovir (VALTREX) 500 MG tablet Take 500 mg by mouth daily as needed (outbreaks).     ziprasidone (GEODON) 40 MG capsule Take 40 mg by mouth daily.     zolpidem (AMBIEN) 10 MG tablet Take 10 mg by mouth at bedtime.     No current facility-administered medications for this visit.   Facility-Administered Medications Ordered in Other Visits  Medication Dose Route Frequency Provider Last Rate Last Admin   chlorhexidine (HIBICLENS) 4 % liquid 1 application  1 application Topical Once Autumn Messing III, MD       chlorhexidine (HIBICLENS) 4 % liquid 1 application  1 application Topical Once Autumn Messing III, MD        Allergies:   Jeanie Cooks allergy], Zoloft [sertraline hcl], Latex, Amitriptyline, Clindamycin, Clindamycin/lincomycin, Other, Penicillins, Pentosan polysulfate sodium, Sulfa antibiotics, Topamax [topiramate], Uribel [meth-hyo-m bl-na phos-ph sal], Uribel [urelle], Lamotrigine, and Sulfamethoxazole-trimethoprim    Social History:  The patient  reports that she has been smoking cigarettes. She has been smoking an average of .5 packs per day. She has never used smokeless tobacco. She reports that she does not drink alcohol and does not use drugs.   Family History:  The patient's family history includes Alzheimer's disease in her maternal aunt and maternal grandmother; Breast cancer in her mother; Colon cancer in her father; Diabetes in her mother; Heart disease in  her father and mother; Hypothyroidism in her brother; Lung disease in her mother; Skin cancer in her maternal aunt.    ROS: All other systems are reviewed and negative. Unless otherwise mentioned in H&P    PHYSICAL EXAM: VS:  There were no vitals taken for this visit. , BMI There is no height or weight on file to calculate BMI. GEN: Well nourished, well developed, in no acute distress HEENT: normal Neck: no JVD, carotid bruits, or masses Cardiac: ***RRR; no murmurs, rubs, or gallops,no edema  Respiratory:  Clear to auscultation bilaterally, normal work of  breathing GI: soft, nontender, nondistended, + BS MS: no deformity or atrophy Skin: warm and dry, no rash Neuro:  Strength and sensation are intact Psych: euthymic mood, full affect   EKG:  EKG {ACTION; IS/IS [id number] ordered today. The ekg ordered today demonstrates ***   Recent Labs: 03/09/2021: ALT 14; B Natriuretic Peptide 66.0 03/11/2021: BUN 15; Creatinine, Ser 0.64; Hemoglobin 13.2; Platelets 185; Potassium 3.7; Sodium 133    Lipid Panel    Component Value Date/Time   CHOL 223 (H) 11/10/2018 1014   TRIG 125 11/10/2018 1014   HDL 78 11/10/2018 1014   CHOLHDL 2.9 11/10/2018 1014   CHOLHDL 3.3 06/27/2018 0623   VLDL 23 06/27/2018 0623   LDLCALC 120 (H) 11/10/2018 1014      Wt Readings from Last 3 Encounters:  04/05/21 157 lb 9.6 oz (71.5 kg)  03/10/21 161 lb 9.6 oz (73.3 kg)  10/31/20 154 lb (69.9 kg)      Other studies Reviewed: Additional studies/ records that were reviewed today include: ***. Review of the above records demonstrates: ***   ASSESSMENT AND PLAN:  1.  ***   Current medicines are reviewed at length with the patient today.  I have spent *** dedicated to the care of this patient on the date of this encounter to include pre-visit review of records, assessment, management and diagnostic testing,with shared decision making.  Labs/ tests ordered today include: *** Phill Myron. [place] Pugh, ANP, AACC   05/20/2021 11:55 AM    [facility] [address] Office [phone] Fax [phone]  Notice: This dictation was prepared with Dragon dictation along with smaller phrase technology. Any transcriptional errors that result from this process are unintentional and may not be corrected upon review.

## 2021-05-25 ENCOUNTER — Ambulatory Visit: Payer: BC Managed Care – PPO | Admitting: Adult Health

## 2021-05-31 DIAGNOSIS — F411 Generalized anxiety disorder: Secondary | ICD-10-CM | POA: Diagnosis not present

## 2021-05-31 DIAGNOSIS — F339 Major depressive disorder, recurrent, unspecified: Secondary | ICD-10-CM | POA: Diagnosis not present

## 2021-06-21 DIAGNOSIS — F339 Major depressive disorder, recurrent, unspecified: Secondary | ICD-10-CM | POA: Diagnosis not present

## 2021-06-21 DIAGNOSIS — F410 Panic disorder [episodic paroxysmal anxiety] without agoraphobia: Secondary | ICD-10-CM | POA: Diagnosis not present

## 2021-07-09 ENCOUNTER — Telehealth: Payer: Self-pay | Admitting: Family Medicine

## 2021-07-09 MED ORDER — ALBUTEROL SULFATE (2.5 MG/3ML) 0.083% IN NEBU: 2.5000 mg | INHALATION_SOLUTION | RESPIRATORY_TRACT | 0 refills | Status: DC | PRN

## 2021-07-09 MED ORDER — BUDESONIDE-FORMOTEROL FUMARATE 160-4.5 MCG/ACT IN AERO: 2.0000 | INHALATION_SPRAY | Freq: Two times a day (BID) | RESPIRATORY_TRACT | 0 refills | Status: DC

## 2021-07-09 NOTE — Telephone Encounter (Signed)
Can you please send in Symbicort 160, montelukast 10 mg, and albuterol all for 30 days supply. Please schedule her for an appointment within the next month. Thank you

## 2021-07-09 NOTE — Telephone Encounter (Signed)
Spoke with patient, informed her that refills have been sent in and she will need to keep her appointment for further refills. Patient verbalized understanding.

## 2021-07-09 NOTE — Telephone Encounter (Signed)
Patient called requesting refill for her rescue inhaler and symbicort. Patient stated she really needs this medication as her husband has just passed away and she has been having trouble breathing. Patient was told she did not have any more refills for the symbicort and was advised to reach out to [place]. I did let patient know it looked like Dr. [place] Crane had sent in the last refill for her symbicort and to reach out to them to refill prescription. Patient states she is unaware of Dr. [place] Crane sending it in and would like Webb Silversmith to take over sending refills in for the symbicort. [facility]ills [facility] to [facility] - [address] [place] [address]  Patient is requesting a call when medications are sent in, [phone].

## 2021-07-17 DIAGNOSIS — F909 Attention-deficit hyperactivity disorder, unspecified type: Secondary | ICD-10-CM | POA: Diagnosis not present

## 2021-07-17 DIAGNOSIS — F339 Major depressive disorder, recurrent, unspecified: Secondary | ICD-10-CM | POA: Diagnosis not present

## 2021-07-17 DIAGNOSIS — F411 Generalized anxiety disorder: Secondary | ICD-10-CM | POA: Diagnosis not present

## 2021-07-17 DIAGNOSIS — F410 Panic disorder [episodic paroxysmal anxiety] without agoraphobia: Secondary | ICD-10-CM | POA: Diagnosis not present

## 2021-07-30 DIAGNOSIS — J329 Chronic sinusitis, unspecified: Secondary | ICD-10-CM | POA: Diagnosis not present

## 2021-07-30 DIAGNOSIS — R87612 Low grade squamous intraepithelial lesion on cytologic smear of cervix (LGSIL): Secondary | ICD-10-CM | POA: Diagnosis not present

## 2021-07-30 DIAGNOSIS — Z01419 Encounter for gynecological examination (general) (routine) without abnormal findings: Secondary | ICD-10-CM | POA: Diagnosis not present

## 2021-07-30 DIAGNOSIS — R053 Chronic cough: Secondary | ICD-10-CM | POA: Diagnosis not present

## 2021-07-31 DIAGNOSIS — F339 Major depressive disorder, recurrent, unspecified: Secondary | ICD-10-CM | POA: Diagnosis not present

## 2021-07-31 DIAGNOSIS — F4010 Social phobia, unspecified: Secondary | ICD-10-CM | POA: Diagnosis not present

## 2021-07-31 DIAGNOSIS — F410 Panic disorder [episodic paroxysmal anxiety] without agoraphobia: Secondary | ICD-10-CM | POA: Diagnosis not present

## 2021-08-06 NOTE — Telephone Encounter (Signed)
Patient called requesting refill for her symbicort. Patient states she has had bronchitis and has had to use more of her symbicort then usual; and has even been using her late husband's inhaler. Patient has been taking antibiotics for 10-14 days because of the bronchitis. I did explain to patient she was told she would need to keep her appointment for any further refills and that the last time we sent in a refill (07-09-2021) it was for a 30 day supply. Patient states she will not make it until Friday (08-10-2021) without her symbicort and is requesting it be sent in. Are we able to send in refill for patient?   [facility] - [place], [place] [address]   Best contact number: [phone]

## 2021-08-07 ENCOUNTER — Encounter: Payer: Self-pay | Admitting: Family Medicine

## 2021-08-07 ENCOUNTER — Ambulatory Visit (INDEPENDENT_AMBULATORY_CARE_PROVIDER_SITE_OTHER): Payer: BC Managed Care – PPO | Admitting: Family Medicine

## 2021-08-07 ENCOUNTER — Telehealth: Payer: Self-pay | Admitting: Family Medicine

## 2021-08-07 ENCOUNTER — Other Ambulatory Visit: Payer: Self-pay

## 2021-08-07 DIAGNOSIS — R051 Acute cough: Secondary | ICD-10-CM | POA: Diagnosis not present

## 2021-08-07 MED ORDER — BUDESONIDE-FORMOTEROL FUMARATE 160-4.5 MCG/ACT IN AERO: 2.0000 | INHALATION_SPRAY | Freq: Two times a day (BID) | RESPIRATORY_TRACT | 5 refills | Status: DC

## 2021-08-07 MED ORDER — BREZTRI AEROSPHERE 160-9-4.8 MCG/ACT IN AERO: 2.0000 | INHALATION_SPRAY | Freq: Two times a day (BID) | RESPIRATORY_TRACT | 5 refills | Status: DC

## 2021-08-07 MED ORDER — LEVALBUTEROL TARTRATE 45 MCG/ACT IN AERO: 2.0000 | INHALATION_SPRAY | RESPIRATORY_TRACT | 1 refills | Status: DC | PRN

## 2021-08-07 NOTE — Patient Instructions (Addendum)
Asthma Continue montelukast 10 mg once a day to prevent cough or wheeze Begin Breztri-2 puffs twice a day with a spacer to prevent cough or wheeze. Rinse your mouth and spit after each use. Call the clinic if you experience thrush.  This will replace Symbicort Continue levalbuterol 2 puffs every 4 hours as needed for cough or wheeze OR Instead use albuterol 0.083% solution via nebulizer one unit vial every 4 hours as needed for cough or wheeze You may use levalbuterol 2 puffs 5-15 minutes before activity to decrease cough or wheeze Get a chest x-ray as soon as possible.  We will call you when the results become available  Chronic rhinitis Continue cetirizine 10 mg once a day as needed for runny nose or itch Continue Flonase 2 sprays in each nostril once a day as needed for stuffy nose.  In the right nostril, point the applicator out toward the right ear. In the left nostril, point the applicator out toward the left ear Begin saline nasal rinses as needed for nasal symptoms. Use this before any medicated nasal sprays for best result  Reflux Begin omeprazole 40 mg once a day to prevent reflux.  Take this medication 30 minutes before your first meal of the day.  This will replace his omeprazole Continue dietary and lifestyle modifications as listed below  Tobacco use Try to cut down and quit smoking Call the clinic if this treatment plan is not working well for you  Follow up in the clinic in 2 weeks or sooner if needed.  Lifestyle Changes for Controlling GERD When you have GERD, stomach acid feels as if it's backing up toward your mouth. Whether or not you take medication to control your GERD, your symptoms can often be improved with lifestyle changes.   Raise Your Head Reflux is more likely to strike when you're lying down flat, because stomach fluid can flow backward more easily. Raising the head of your bed 4-6 inches can help. To do this: Slide blocks or books under the legs at the  head of your bed. Or, place a wedge under the mattress. Many foam stores can make a suitable wedge for you. The wedge should run from your waist to the top of your head. Don't just prop your head on several pillows. This increases pressure on your stomach. It can make GERD worse.  Watch Your Eating Habits Certain foods may increase the acid in your stomach or relax the lower esophageal sphincter, making GERD more likely. It's best to avoid the following: Coffee, tea, and carbonated drinks (with and without caffeine) Fatty, fried, or spicy food Mint, chocolate, onions, and tomatoes Any other foods that seem to irritate your stomach or cause you pain  Relieve the Pressure Eat smaller meals, even if you have to eat more often. Don't lie down right after you eat. Wait a few hours for your stomach to empty. Avoid tight belts and tight-fitting clothes. Lose excess weight.  Tobacco and Alcohol Avoid smoking tobacco and drinking alcohol. They can make GERD symptoms worse.

## 2021-08-07 NOTE — Telephone Encounter (Signed)
Patient was told Webb Silversmith would call her and let her know where she could get the x-ray done. I advised patient that Webb Silversmith is working out of our [employer], I called over the [employer] office at patients request. Pt also requested to know if her medications had been sent in.   I was able to speak to Shirlean Mylar who stated the order for the x ray was still being worked on as well as her prescriptions.   I advised patient of this and did let her know Webb Silversmith was still with patients at the moment. I advised patient the orders would be put in as soon as they were able to and she would receive a call.

## 2021-08-07 NOTE — Progress Notes (Signed)
RE: Sarah Reeves MRN: [id number] DOB: Feb 22, 1961 Date of Telemedicine Visit: 08/07/2021  Referring provider: Dian Queen, MD Primary care provider: Dian Queen, MD  Chief Complaint: Asthma, Cough, and Wheezing   Telemedicine Follow Up Visit via Telephone: I connected with Sarah Reeves for a follow up on 08/07/21 by telephone and verified that I am speaking with the correct person using two identifiers.   I discussed the limitations, risks, security and privacy concerns of performing an evaluation and management service by telephone and the availability of in person appointments. I also discussed with the patient that there may be a patient responsible charge related to this service. The patient expressed understanding and agreed to proceed.  Patient is at home  Provider is at the office.  Visit start time: 3532 Visit end time: [place] consent/check in by: Sarah Reeves Medical consent and medical assistant/nurse: Sarah Reeves  History of Present Illness: She is a 60 y.o. female, who is being followed for asthma/COPD overlap, chronic rhinitis, reflux, and tobacco use. Her previous allergy office visit was on 04/05/2021 with Sarah Reeves, Sarah Reeves.  In the interim, she reports that a couple of weeks ago she began to experience a sore throat in addition to cough producing mucus ranging from clear to gray to yellow.  She denies fever, sweats, chills, or sick contacts at that time.  She reportedly took a home COVID test that was negative when her symptoms began.  At today's visit, she reports shortness of breath with activity which is not new for her, wheezing occurring at night and in the daytime, and cough producing mucus ranging from clear to gray to yellow.  She continues Symbicort 160, however, she has run out of this medication and has been using her late husband's inhaler which she thinks is Symbicort, however, the canister is a different color than her Symbicort inhaler.  She continues  montelukast, however, reports that she forgets to take this medication frequently.  She is using levalbuterol about 2 puffs a day.  She reports that she has been on a round of Augmentin with no improvement in her symptoms.  Allergic rhinitis is reported as moderately well controlled with symptoms including nasal congestion with drainage ranging in color from yellow to clear and postnasal drainage.  Reflux is reported as poorly controlled with heartburn occurring more at night.  She reports she has been drinking wine frequently in order to deal with her husband's recent passing.  She reports this has flared her reflux.  She continues Nexium 20 mg twice a day.  She reports she has recently increased her cigarette smoking and is currently smoking about 1 pack a day.  She reports that she is interested in a low-dose CT scan for lung cancer screening as she is a current cigarette smoker.  Her current medications are listed in the chart.  Assessment and Plan: Sarah Reeves is a 60 y.o. female with: Patient Instructions  Asthma Continue montelukast 10 mg once a day to prevent cough or wheeze Begin Breztri-2 puffs twice a day with a spacer to prevent cough or wheeze. Rinse your mouth and spit after each use. Call the clinic if you experience thrush.  This will replace Symbicort Continue levalbuterol 2 puffs every 4 hours as needed for cough or wheeze OR Instead use albuterol 0.083% solution via nebulizer one unit vial every 4 hours as needed for cough or wheeze You may use levalbuterol 2 puffs 5-15 minutes before activity to decrease cough or wheeze Get a chest x-ray  as soon as possible.  We will call you when the results become available  Chronic rhinitis Continue cetirizine 10 mg once a day as needed for runny nose or itch Continue Flonase 2 sprays in each nostril once a day as needed for stuffy nose.  In the right nostril, point the applicator out toward the right ear. In the left nostril, point the applicator out  toward the left ear Begin saline nasal rinses as needed for nasal symptoms. Use this before any medicated nasal sprays for best result  Reflux Begin omeprazole 40 mg once a day to prevent reflux.  Take this medication 30 minutes before your first meal of the day.  This will replace his omeprazole Continue dietary and lifestyle modifications as listed below  Tobacco use Try to cut down and quit smoking  Recommend moving forward with low-dose CT scanning for lung cancer screening.  Follow-up with your primary care provider for more information regarding screening for lung cancer  Call the clinic if this treatment plan is not working well for you  Follow up in the clinic in 2 weeks or sooner if needed.   Return in about 2 weeks (around 08/21/2021), or if symptoms worsen or fail to improve.  Meds ordered this encounter  Medications   DISCONTD: budesonide-formoterol (SYMBICORT) 160-4.5 MCG/ACT inhaler    Sig: Inhale 2 puffs into the lungs 2 (two) times daily.    Dispense:  10.2 g    Refill:  5   levalbuterol (XOPENEX HFA) 45 MCG/ACT inhaler    Sig: Inhale 2 puffs into the lungs every 4 (four) hours as needed for wheezing.    Dispense:  15 g    Refill:  1   Budeson-Glycopyrrol-Formoterol (BREZTRI AEROSPHERE) 160-9-4.8 MCG/ACT AERO    Sig: Inhale 2 puffs into the lungs in the morning and at bedtime.    Dispense:  10.7 g    Refill:  5    Diagnostics: Chest x-ray ordered  Medication List:  Current Outpatient Medications  Medication Sig Dispense Refill   Ascorbic Acid (VITAMIN C) 1000 MG tablet Take 1,000 mg by mouth daily.     aspirin EC 81 MG tablet Take 1 tablet (81 mg total) by mouth daily. 90 tablet 3   Budeson-Glycopyrrol-Formoterol (BREZTRI AEROSPHERE) 160-9-4.8 MCG/ACT AERO Inhale 2 puffs into the lungs in the morning and at bedtime. 10.7 g 5   cetirizine (ZYRTEC) 10 MG tablet Take 10 mg by mouth daily.     chlorpheniramine-HYDROcodone (TUSSIONEX) 10-8 MG/5ML SUER hydrocodone  10 mg-chlorpheniramine 8 mg/5 mL oral susp extend.rel 12hr  Take 5 mL every 12 hours by oral route.     cholecalciferol (VITAMIN D) 1000 UNITS tablet Take 2,000 Units by mouth daily.      cyclobenzaprine (FLEXERIL) 10 MG tablet Take 10 mg by mouth 3 (three) times daily as needed for muscle spasms.     DULoxetine (CYMBALTA) 30 MG capsule Take 30 mg by mouth daily.     estradiol (VIVELLE-DOT) 0.075 MG/24HR Place onto the skin.     gabapentin (NEURONTIN) 300 MG capsule Take 300 mg by mouth daily as needed (nerve pain).     HYDROcodone-acetaminophen (NORCO) 10-325 MG tablet Take 1 tablet by mouth every 4 (four) hours as needed.     levalbuterol (XOPENEX) 1.25 MG/3ML nebulizer solution Take 1.25 mg by nebulization every 4 (four) hours as needed for wheezing. 72 mL 5   LORazepam (ATIVAN) 2 MG tablet Take 2 mg by mouth in the morning, at noon, and  at bedtime.     meclizine (ANTIVERT) 25 MG tablet Take 25 mg by mouth 3 (three) times daily as needed for dizziness.     montelukast (SINGULAIR) 10 MG tablet Take 1 tablet (10 mg total) by mouth at bedtime. 30 tablet 5   progesterone (PROMETRIUM) 100 MG capsule Take 100 mg by mouth at bedtime.     promethazine (PHENERGAN) 25 MG tablet Take 25 mg by mouth every 6 (six) hours as needed for nausea or vomiting.     triamterene-hydrochlorothiazide (DYAZIDE) 37.5-25 MG capsule Take 1 capsule by mouth daily.     valACYclovir (VALTREX) 500 MG tablet Take 500 mg by mouth daily as needed (outbreaks).     ziprasidone (GEODON) 40 MG capsule Take 40 mg by mouth daily.     zolpidem (AMBIEN) 10 MG tablet Take 10 mg by mouth at bedtime.     levalbuterol (XOPENEX HFA) 45 MCG/ACT inhaler Inhale 2 puffs into the lungs every 4 (four) hours as needed for wheezing. 15 g 1   No current facility-administered medications for this visit.   Facility-Administered Medications Ordered in Other Visits  Medication Dose Route Frequency Provider Last Rate Last Admin   chlorhexidine  (HIBICLENS) 4 % liquid 1 application  1 application Topical Once Autumn Messing III, MD       chlorhexidine (HIBICLENS) 4 % liquid 1 application  1 application Topical Once Jovita Kussmaul, MD       Allergies: Allergies  Allergen Reactions   Oysters [Shellfish Allergy] Other (See Comments)    Unknown reaction; "MD told me I was allergic"   Zoloft [Sertraline Hcl] Other (See Comments)    REACTION: Nightmares, Grind teeth really bad   Latex Itching    Itch and blisters from contact   Amitriptyline     Hot, itching, tongue tingling, breakthrough bleeding.   Clindamycin Other (See Comments)   Clindamycin/Lincomycin Hives and Other (See Comments)    Stated that this causes her to get really horse and made her feel really hot   Other Other (See Comments)   Penicillins    Pentosan Polysulfate Sodium Swelling    Throat swelling   Sulfa Antibiotics Other (See Comments)   Topamax [Topiramate]     Says it makes her "constipation and she can't urinate normally"   Uribel [Meth-Hyo-M Bl-Na Phos-Ph Sal] Nausea Only   Uribel [Urelle] Swelling and Other (See Comments)    Patient stated this makes her stomach swell and makes her feel si   Lamotrigine Rash   Sulfamethoxazole-Trimethoprim Rash    Patient developed feelings of dizziness, rash and itchy hands.  Patient then vomited and had diarrhea.   I reviewed her past medical history, social history, family history, and environmental history and no significant changes have been reported from previous visit on 04/05/2021.  Objective: Physical Exam Not obtained as encounter was done via telephone.   Previous notes and tests were reviewed.  I discussed the assessment and treatment plan with the patient. The patient was provided an opportunity to ask questions and all were answered. The patient agreed with the plan and demonstrated an understanding of the instructions.   The patient was advised to call back or seek an in-person evaluation if the symptoms  worsen or if the condition fails to improve as anticipated.  I provided 38 minutes of non-face-to-face time during this encounter.  It was my pleasure to participate in [place] care today. Please feel free to contact me with any questions or concerns.  Sincerely,  Sarah Morgan, FNP

## 2021-08-07 NOTE — Telephone Encounter (Signed)
Can you please put her on my schedule for a tele visit today? Thank you

## 2021-08-08 ENCOUNTER — Telehealth: Payer: Self-pay | Admitting: Family Medicine

## 2021-08-08 NOTE — Telephone Encounter (Signed)
Patient reports that she has picked up her inhalers that were ordered yesterday. She reports her breathing is a little better today. I notified her that the order for the chest xray at [facility] had been placed yesterday. She reports that she is concerned about liver cancer as she has been taking Geodon and drinking alcohol since the passing of her husband. She is planning on getting a ride with a family member to get checked out tomorrow. She is planning to combine this trip with the chest xray. She will call if her symptoms worsen or she has any questions.

## 2021-08-08 NOTE — Telephone Encounter (Signed)
Tried calling patient to see if she was going to get her chest xray that we ordered yesterday.  Phone had a busy signal, no answer. Please refer to Anne's note prior to this phone call.

## 2021-08-09 ENCOUNTER — Emergency Department (HOSPITAL_BASED_OUTPATIENT_CLINIC_OR_DEPARTMENT_OTHER): Payer: BC Managed Care – PPO

## 2021-08-09 ENCOUNTER — Encounter (HOSPITAL_BASED_OUTPATIENT_CLINIC_OR_DEPARTMENT_OTHER): Payer: Self-pay

## 2021-08-09 ENCOUNTER — Other Ambulatory Visit: Payer: Self-pay

## 2021-08-09 ENCOUNTER — Emergency Department (HOSPITAL_BASED_OUTPATIENT_CLINIC_OR_DEPARTMENT_OTHER)
Admission: EM | Admit: 2021-08-09 | Discharge: 2021-08-09 | Disposition: A | Discharge status: Home / Self Care | Payer: BC Managed Care – PPO | Attending: Emergency Medicine | Admitting: Emergency Medicine

## 2021-08-09 DIAGNOSIS — R059 Cough, unspecified: Secondary | ICD-10-CM | POA: Diagnosis not present

## 2021-08-09 DIAGNOSIS — K219 Gastro-esophageal reflux disease without esophagitis: Secondary | ICD-10-CM | POA: Insufficient documentation

## 2021-08-09 DIAGNOSIS — Z7982 Long term (current) use of aspirin: Secondary | ICD-10-CM | POA: Insufficient documentation

## 2021-08-09 DIAGNOSIS — Z79899 Other long term (current) drug therapy: Secondary | ICD-10-CM | POA: Insufficient documentation

## 2021-08-09 DIAGNOSIS — J45909 Unspecified asthma, uncomplicated: Secondary | ICD-10-CM | POA: Diagnosis not present

## 2021-08-09 DIAGNOSIS — J189 Pneumonia, unspecified organism: Secondary | ICD-10-CM | POA: Diagnosis not present

## 2021-08-09 DIAGNOSIS — Z91040 Latex allergy status: Secondary | ICD-10-CM | POA: Insufficient documentation

## 2021-08-09 DIAGNOSIS — R1011 Right upper quadrant pain: Secondary | ICD-10-CM | POA: Insufficient documentation

## 2021-08-09 DIAGNOSIS — F17210 Nicotine dependence, cigarettes, uncomplicated: Secondary | ICD-10-CM | POA: Insufficient documentation

## 2021-08-09 DIAGNOSIS — J441 Chronic obstructive pulmonary disease with (acute) exacerbation: Secondary | ICD-10-CM | POA: Insufficient documentation

## 2021-08-09 LAB — COMPREHENSIVE METABOLIC PANEL
ALT: 14 U/L (ref 0–44)
AST: 17 U/L (ref 15–41)
Albumin: 4.5 g/dL (ref 3.5–5.0)
Alkaline Phosphatase: 54 U/L (ref 38–126)
Anion gap: 9 (ref 5–15)
BUN: 8 mg/dL (ref 6–20)
CO2: 24 mmol/L (ref 22–32)
Calcium: 9.3 mg/dL (ref 8.9–10.3)
Chloride: 101 mmol/L (ref 98–111)
Creatinine, Ser: 0.67 mg/dL (ref 0.44–1.00)
GFR, Estimated: >60 mL/min (ref >60)
Glucose, Bld: 114 mg/dL — ABNORMAL HIGH (ref 70–99)
Potassium: 3.9 mmol/L (ref 3.5–5.1)
Sodium: 134 mmol/L — ABNORMAL LOW (ref 135–145)
Total Bilirubin: 0.6 mg/dL (ref 0.3–1.2)
Total Protein: 7.5 g/dL (ref 6.5–8.1)

## 2021-08-09 LAB — CBC
HCT: 44.5 % (ref 36.0–46.0)
Hemoglobin: 15.1 g/dL — ABNORMAL HIGH (ref 12.0–15.0)
MCH: 31.6 pg (ref 26.0–34.0)
MCHC: 33.9 g/dL (ref 30.0–36.0)
MCV: 93.1 fL (ref 80.0–100.0)
Platelets: 254 10*3/uL (ref 150–400)
RBC: 4.78 MIL/uL (ref 3.87–5.11)
RDW: 14.7 % (ref 11.5–15.5)
WBC: 9.3 10*3/uL (ref 4.0–10.5)
nRBC: 0 % (ref 0.0–0.2)

## 2021-08-09 LAB — URINALYSIS, ROUTINE W REFLEX MICROSCOPIC
Bilirubin Urine: NEGATIVE
Glucose, UA: NEGATIVE mg/dL
Hgb urine dipstick: NEGATIVE
Ketones, ur: NEGATIVE mg/dL
Leukocytes,Ua: NEGATIVE
Nitrite: NEGATIVE
Protein, ur: NEGATIVE mg/dL
Specific Gravity, Urine: 1.01 (ref 1.005–1.030)
pH: 6 (ref 5.0–8.0)

## 2021-08-09 LAB — ETHANOL: Alcohol, Ethyl (B): <10 mg/dL (ref <10)

## 2021-08-09 LAB — PREGNANCY, URINE: Preg Test, Ur: NEGATIVE

## 2021-08-09 LAB — LIPASE, BLOOD: Lipase: 22 U/L (ref 11–51)

## 2021-08-09 MED ORDER — PREDNISONE 10 MG PO TABS: 40.0000 mg | ORAL_TABLET | Freq: Every day | ORAL | 0 refills | Status: AC

## 2021-08-09 MED ORDER — DOXYCYCLINE HYCLATE 100 MG PO CAPS: 100.0000 mg | ORAL_CAPSULE | Freq: Two times a day (BID) | ORAL | 0 refills | Status: DC

## 2021-08-09 MED ORDER — OXYCODONE HCL 5 MG PO TABS
5.0000 mg | ORAL_TABLET | Freq: Once | ORAL | Status: AC
  Administered 2021-08-09: 5 mg via ORAL
  Filled 2021-08-09: qty 1

## 2021-08-09 MED ORDER — AMOXICILLIN-POT CLAVULANATE 875-125 MG PO TABS: 1.0000 | ORAL_TABLET | Freq: Two times a day (BID) | ORAL | 0 refills | Status: DC

## 2021-08-09 MED ORDER — DEXTROMETHORPHAN-GUAIFENESIN 10-100 MG/5ML PO SYRP: 5.0000 mL | ORAL_SOLUTION | Freq: Two times a day (BID) | ORAL | 0 refills | Status: DC

## 2021-08-09 MED ORDER — OMEPRAZOLE 20 MG PO CPDR: 20.0000 mg | DELAYED_RELEASE_CAPSULE | Freq: Every day | ORAL | 0 refills | Status: DC

## 2021-08-09 MED ORDER — IPRATROPIUM BROMIDE 0.02 % IN SOLN
0.5000 mg | Freq: Once | RESPIRATORY_TRACT | Status: AC
  Administered 2021-08-09: 0.5 mg via RESPIRATORY_TRACT
  Filled 2021-08-09: qty 2.5

## 2021-08-09 MED ORDER — IPRATROPIUM-ALBUTEROL 0.5-2.5 (3) MG/3ML IN SOLN: 3.0000 mL | Freq: Once | RESPIRATORY_TRACT | Status: DC

## 2021-08-09 MED ORDER — LEVALBUTEROL HCL 1.25 MG/0.5ML IN NEBU
1.2500 mg | INHALATION_SOLUTION | Freq: Once | RESPIRATORY_TRACT | Status: AC
  Administered 2021-08-09: 1.25 mg via RESPIRATORY_TRACT
  Filled 2021-08-09: qty 0.5

## 2021-08-09 NOTE — Discharge Instructions (Addendum)
Take antibiotics as prescribed and complete the full course.  Take prednisone as prescribed. Follow-up with your doctor for recheck next week.  Recommend repeat chest x-ray in 6 weeks to ensure resolution of pneumonia. Recheck abdominal pain with your doctor, no acute findings on ultrasound today. Blood work is overall reassuring.

## 2021-08-09 NOTE — ED Provider Notes (Signed)
[facility] EMERGENCY DEPARTMENT Provider Note   CSN: [id number] Arrival date & time: 08/09/21  1053     History Chief Complaint  Patient presents with   Abdominal Pain    Sarah Reeves is a 60 y.o. female.  60 year old female with history of hyperlipidemia, asthma/COPD, hepatitis C, additional history as listed below, presents with complaint of cough x1 month, right upper quadrant abdominal pain and right-sided chest pain. Patient states that she coughs up copious amounts of frothy white sputum throughout the night for the past month.  Patient reports the death of her husband 1 month ago, had family stay with her following the event and they all had a cold.  Patient did have a negative COVID test at that time.  Has been in contact with her pulmonologist, is using her Xopenex inhaler and nebulizer treatments as well as other medications as prescribed.  Patient had a virtual visit 2 days ago, recommended a chest x-ray however she has not had this done as of yet. Reports pain in her right upper abdomen which radiates into her right side chest and back, this has been ongoing, progressively worsening, she is concerned she has liver cancer since she takes Geodon and does not have her LFTs checked for several years.  Also taking Norco for pain leftover from prior surgery, states that this helps with her pain however has since worn off today.  Also drinking alcohol. Denies nausea, vomiting, fevers, chills, changes in bowel or bladder habits.  No other complaints or concerns today.       Past Medical History:  Diagnosis Date   Angio-edema    Anxiety    Asthma    Complication of anesthesia    takes alot to sedated   Depression    Fibromyalgia    GERD (gastroesophageal reflux disease)    Hepatitis C    (Dr. Earlean Shawl) Treated with Harvoni March-May 2016   Herpes simplex    Hyperlipemia    Insomnia    Mental disorder    Neuropathy    PONV (postoperative nausea and vomiting)     Urticaria     Patient Active Problem List   Diagnosis Date Noted   Asthma-COPD overlap syndrome ([place]) 04/05/2021   Tobacco use 04/05/2021   Chronic rhinitis 04/05/2021   Asthma exacerbation 03/09/2021   Acute respiratory failure with hypoxia ([place]) 03/09/2021   High blood pressure due to drug 08/06/2018   Post traumatic stress disorder (PTSD) 06/26/2018   Generalized anxiety disorder 06/26/2018   MDD (major depressive disorder), recurrent, severe, 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/26/2018   Balance problem 01/28/2018   Tremor 01/28/2018   Tobacco abuse counseling 07/25/2015   Chest pain at rest 06/26/2015   Dizziness of unknown cause 06/26/2015   Hyperglycemia 06/26/2015   Paresthesia 06/20/2015   Suicidal ideation 06/06/2013   Major depressive disorder, recurrent, severe with psychotic features ([place]) 06/06/2013   Alcohol addiction ([place]) 04/30/2013   HEPATITIS C 06/30/2007   ALLERGIC RHINITIS 06/30/2007   ASTHMA 06/30/2007   GERD 06/30/2007    Past Surgical History:  Procedure Laterality Date   BREAST ENHANCEMENT SURGERY     BREAST LUMPECTOMY WITH RADIOACTIVE SEED LOCALIZATION Left 12/26/2014   Procedure: LEFT BREAST LUMPECTOMY WITH RADIOACTIVE SEED LOCALIZATION;  Surgeon: Autumn Messing III, MD;  Location: [facility];  Service: General;  Laterality: Left;   BREAST REDUCTION SURGERY     COLONOSCOPY     EXPLORATORY LAPAROTOMY     NM MYOVIEW LTD  07/07/15  Normal Myocardial Perfusion Scan. Low risk lexiscan nuclear study with minimal insignificant breast attenuation and normal myocardial perfusion and function; EF 53% without wall motion abnormalities and normal systolic thickening   TEMPOROMANDIBULAR JOINT SURGERY     TUBAL LIGATION     UPPER GI ENDOSCOPY     x5     OB History     Gravida      Para      Term      Preterm      AB      Living  0      SAB      IAB      Ectopic      Multiple      Live Births              Family History  Problem  Relation Age of Onset   Diabetes Mother    Lung disease Mother    Heart disease Mother    Breast cancer Mother    Heart disease Father    Colon cancer Father    Hypothyroidism Brother    Alzheimer's disease Maternal Aunt    Skin cancer Maternal Aunt    Alzheimer's disease Maternal Grandmother     Social History   Tobacco Use   Smoking status: Every Day    Packs/day: 0.50    Types: Cigarettes   Smokeless tobacco: Never  Vaping Use   Vaping Use: Never used  Substance Use Topics   Alcohol use: No   Drug use: No    Comment: did have hx drug abuse as teen    Home Medications Prior to Admission medications   Medication Sig Start Date End Date Taking? Authorizing Provider  amoxicillin-clavulanate (AUGMENTIN) 875-125 MG tablet Take 1 tablet by mouth every 12 (twelve) hours. 08/09/21  Yes Tacy Learn, PA-C  Dextromethorphan-guaiFENesin (TUSSIN DM) 10-100 MG/5ML liquid Take 5 mLs by mouth every 12 (twelve) hours. 08/09/21  Yes Tacy Learn, PA-C  doxycycline (VIBRAMYCIN) 100 MG capsule Take 1 capsule (100 mg total) by mouth 2 (two) times daily. 08/09/21  Yes Tacy Learn, PA-C  predniSONE (DELTASONE) 10 MG tablet Take 4 tablets (40 mg total) by mouth daily for 5 days. 08/09/21 08/14/21 Yes Tacy Learn, PA-C  Ascorbic Acid (VITAMIN C) 1000 MG tablet Take 1,000 mg by mouth daily.    [provider]  aspirin EC 81 MG tablet Take 1 tablet (81 mg total) by mouth daily. 08/04/18   Leonie Man, MD  Budeson-Glycopyrrol-Formoterol (BREZTRI AEROSPHERE) 160-9-4.8 MCG/ACT AERO Inhale 2 puffs into the lungs in the morning and at bedtime. 08/07/21   Dara Hoyer, FNP  cetirizine (ZYRTEC) 10 MG tablet Take 10 mg by mouth daily.    [provider]  chlorpheniramine-HYDROcodone (TUSSIONEX) 10-8 MG/5ML SUER hydrocodone 10 mg-chlorpheniramine 8 mg/5 mL oral susp extend.rel 12hr  Take 5 mL every 12 hours by oral route.    [provider]  cholecalciferol  (VITAMIN D) 1000 UNITS tablet Take 2,000 Units by mouth daily.     [provider]  cyclobenzaprine (FLEXERIL) 10 MG tablet Take 10 mg by mouth 3 (three) times daily as needed for muscle spasms.    [provider]  DULoxetine (CYMBALTA) 30 MG capsule Take 30 mg by mouth daily. 11/29/19   [provider]  estradiol (VIVELLE-DOT) 0.075 MG/24HR Place onto the skin. 07/30/21   [provider]  gabapentin (NEURONTIN) 300 MG capsule Take 300 mg by mouth daily as  needed (nerve pain). 11/29/19   [provider]  HYDROcodone-acetaminophen (NORCO) 10-325 MG tablet Take 1 tablet by mouth every 4 (four) hours as needed. 07/27/21   [provider]  levalbuterol Penne Lash HFA) 45 MCG/ACT inhaler Inhale 2 puffs into the lungs every 4 (four) hours as needed for wheezing. 08/07/21   Dara Hoyer, FNP  levalbuterol (XOPENEX) 1.25 MG/3ML nebulizer solution Take 1.25 mg by nebulization every 4 (four) hours as needed for wheezing. 04/05/21   Ambs, Kathrine Cords, FNP  LORazepam (ATIVAN) 2 MG tablet Take 2 mg by mouth in the morning, at noon, and at bedtime. 12/17/19   [provider]  meclizine (ANTIVERT) 25 MG tablet Take 25 mg by mouth 3 (three) times daily as needed for dizziness.    [provider]  montelukast (SINGULAIR) 10 MG tablet Take 1 tablet (10 mg total) by mouth at bedtime. 04/05/21   Dara Hoyer, FNP  progesterone (PROMETRIUM) 100 MG capsule Take 100 mg by mouth at bedtime. 12/15/20   [provider]  promethazine (PHENERGAN) 25 MG tablet Take 25 mg by mouth every 6 (six) hours as needed for nausea or vomiting.    [provider]  triamterene-hydrochlorothiazide (DYAZIDE) 37.5-25 MG capsule Take 1 capsule by mouth daily. 11/16/19   [provider]  valACYclovir (VALTREX) 500 MG tablet Take 500 mg by mouth daily as needed (outbreaks). 11/16/19   [provider]  ziprasidone (GEODON) 40 MG capsule Take 40 mg by mouth  daily.    [provider]  zolpidem (AMBIEN) 10 MG tablet Take 10 mg by mouth at bedtime. 03/20/21   [provider]    Allergies    Oysters [shellfish allergy], Zoloft [sertraline hcl], Latex, Amitriptyline, Clindamycin, Clindamycin/lincomycin, Other, Penicillins, Pentosan polysulfate sodium, Sulfa antibiotics, Topamax [topiramate], Uribel [meth-hyo-m bl-na phos-ph sal], Uribel [urelle], Lamotrigine, and Sulfamethoxazole-trimethoprim  Review of Systems   Review of Systems  Constitutional:  Negative for chills and fever.  HENT:  Positive for congestion.   Respiratory:  Positive for cough, shortness of breath and wheezing.   Cardiovascular:  Positive for chest pain.  Gastrointestinal:  Positive for abdominal pain. Negative for constipation, diarrhea, nausea and vomiting.  Musculoskeletal:  Negative for arthralgias and myalgias.  Skin:  Negative for rash and wound.  Allergic/Immunologic: Negative for immunocompromised state.  Neurological:  Negative for weakness.  Hematological:  Negative for adenopathy.  Psychiatric/Behavioral:  Negative for confusion.   All other systems reviewed and are negative.  Physical Exam Updated Vital Signs BP 132/80 (BP Location: Right Arm)   Pulse 91   Temp 98.1 F (36.7 C) (Oral)   Resp 18   Ht 5\' 4"  (1.626 m)   Wt 69.9 kg   SpO2 94%   BMI 26.43 kg/m   Physical Exam Vitals and nursing note reviewed.  Constitutional:      General: She is not in acute distress.    Appearance: She is well-developed. She is not diaphoretic.  HENT:     Head: Normocephalic and atraumatic.  Cardiovascular:     Rate and Rhythm: Normal rate and regular rhythm.     Heart sounds: Normal heart sounds.  Pulmonary:     Effort: Pulmonary effort is normal.     Breath sounds: Wheezing and rales present.  Abdominal:     Palpations: Abdomen is soft.     Tenderness: There is abdominal tenderness in the right upper quadrant. There is no right CVA tenderness or  left CVA tenderness.  Skin:  General: Skin is warm and dry.     Findings: No erythema or rash.  Neurological:     Mental Status: She is alert and oriented to person, place, and time.  Psychiatric:        Behavior: Behavior normal.    ED Results / Procedures / Treatments   Labs (all labs ordered are listed, but only abnormal results are displayed) Labs Reviewed  COMPREHENSIVE METABOLIC PANEL - Abnormal; Notable for the following components:      Result Value   Sodium 134 (*)    Glucose, Bld 114 (*)    All other components within normal limits  CBC - Abnormal; Notable for the following components:   Hemoglobin 15.1 (*)    All other components within normal limits  LIPASE, BLOOD  URINALYSIS, ROUTINE W REFLEX MICROSCOPIC  PREGNANCY, URINE  ETHANOL    EKG None  Radiology DG Chest 2 View  Result Date: 08/09/2021 CLINICAL DATA:  cough EXAM: CHEST - 2 VIEW COMPARISON:  03/09/2021, 11/18/2019 FINDINGS: Heart size is normal. Focal rounded opacity within the lingula. Right lung is clear. No pleural effusion or pneumothorax. No acute bony findings. IMPRESSION: Focal rounded opacity within the lingula, which may represent pneumonia. Radiographic follow-up after appropriate treatment is recommended to document resolution and exclude the possibility of an underlying mass. Electronically Signed   By: Davina Poke D.O.   On: 08/09/2021 13:30   US Abdomen Limited RUQ (LIVER/GB)  Result Date: 08/09/2021 CLINICAL DATA:  Right upper quadrant pain. EXAM: ULTRASOUND ABDOMEN LIMITED RIGHT UPPER QUADRANT COMPARISON:  Abdominal ultrasound dated August 09, 2014. FINDINGS: Gallbladder: No gallstones or wall thickening visualized. No sonographic Maizie Garno sign noted by sonographer. Common bile duct: Diameter: 8-9 mm, mildly dilated, but unchanged since 2015. Liver: No focal lesion identified. Within normal limits in parenchymal echogenicity. Portal vein is patent on color Doppler imaging with normal  direction of blood flow towards the liver. Other: None. IMPRESSION: 1. No acute abnormality. 2. Chronic mild common bile duct dilatation. Electronically Signed   By: Titus Dubin M.D.   On: 08/09/2021 13:48    Procedures Procedures   Medications Ordered in ED Medications  levalbuterol (XOPENEX) nebulizer solution 1.25 mg (1.25 mg Nebulization Given 08/09/21 1321)  ipratropium (ATROVENT) nebulizer solution 0.5 mg (0.5 mg Nebulization Given 08/09/21 1322)  oxyCODONE (Oxy IR/ROXICODONE) immediate release tablet 5 mg (5 mg Oral Given 08/09/21 1344)    ED Course  I have reviewed the triage vital signs and the nursing notes.  Pertinent labs & imaging results that were available during my care of the patient were reviewed by me and considered in my medical decision making (see chart for details).  Clinical Course as of 08/09/21 1413  Thu Aug 10, 6835  1142 60 year old female with complaint of cough productive with white frothy sputum, right upper quadrant abdominal pain radiating up into right side chest and back as above.  Found to have coarse lungs throughout with wheezing, treated with nebulizer treatment, found to have chest x-ray concerning for pneumonia.  Plan is to treat with steroids and antibiotics for pneumonia and COPD exacerbation.  Recommend repeat chest x-ray in 6 weeks to ensure resolution.  May need additional imaging if not resolved per x-ray report. Regarding her right side abdominal pain, negative Avianah Pellman sign, LFTs total bilirubin normal.  Ultrasound right upper quadrant without acute findings today, recommend recheck with PCP. CBC with normal white count, lipase within normal limits, urinalysis is unremarkable. [LM]    Clinical Course User  Index [LM] Roque Lias   MDM Rules/Calculators/A&P                           Final Clinical Impression(s) / ED Diagnoses Final diagnoses:  Right upper quadrant abdominal pain  Community acquired pneumonia, unspecified  laterality  COPD exacerbation ([place])    Rx / DC Orders ED Discharge Orders          Ordered    amoxicillin-clavulanate (AUGMENTIN) 875-125 MG tablet  Every 12 hours        08/09/21 1408    doxycycline (VIBRAMYCIN) 100 MG capsule  2 times daily        08/09/21 1408    Dextromethorphan-guaiFENesin (TUSSIN DM) 10-100 MG/5ML liquid  Every 12 hours        08/09/21 1408    predniSONE (DELTASONE) 10 MG tablet  Daily        08/09/21 1409             Tacy Learn, PA-C 08/09/21 1413    Godfrey Pick, MD 08/09/21 2155

## 2021-08-09 NOTE — ED Triage Notes (Signed)
Pt c/o abd pain, bilat flank pain, n/v/d x 4 months-c/o flu like sx x 1 month-NAD-steady gait

## 2021-08-10 ENCOUNTER — Ambulatory Visit: Payer: BC Managed Care – PPO | Admitting: Family Medicine

## 2021-08-10 ENCOUNTER — Telehealth: Payer: Self-pay | Admitting: Family Medicine

## 2021-08-10 NOTE — Telephone Encounter (Signed)
Refer to ED visit from yesterday.

## 2021-08-10 NOTE — Telephone Encounter (Signed)
Can you please ask this patient what her question is regarding her inhaler? Called to talk with her about her chest xray and medications but got no answer and was not able to leave a message. Thank you

## 2021-08-10 NOTE — Telephone Encounter (Signed)
Patient is stating she has completed her chest xray but was told she should come back for a follow up xray she is asking anne ambs NP for another order and also she has questions about her inhaler please advise

## 2021-08-14 DIAGNOSIS — F339 Major depressive disorder, recurrent, unspecified: Secondary | ICD-10-CM | POA: Diagnosis not present

## 2021-08-14 DIAGNOSIS — F909 Attention-deficit hyperactivity disorder, unspecified type: Secondary | ICD-10-CM | POA: Diagnosis not present

## 2021-08-14 DIAGNOSIS — F410 Panic disorder [episodic paroxysmal anxiety] without agoraphobia: Secondary | ICD-10-CM | POA: Diagnosis not present

## 2021-08-14 DIAGNOSIS — F4010 Social phobia, unspecified: Secondary | ICD-10-CM | POA: Diagnosis not present

## 2021-08-14 NOTE — Telephone Encounter (Signed)
Called pt and pt stated that she needs another order for the follow up chest x-ray, and the question about the inhaler is that her Budesonide states that it can cause pneumonia should she still be taking it. Pt  states she will be all day today for a return call.

## 2021-08-15 NOTE — Telephone Encounter (Signed)
Can you please find out what her question is about the inhaler. She should get the chest xray from the provider who is treating her pneumonia. If she can not get a chest xray from her treating provider please schedule her for an OV in 3 weeks and we can take care of it. Thank you

## 2021-08-15 NOTE — Telephone Encounter (Signed)
The question she had about the Sarah Reeves is why did she get it if it can cause pneumonia? She read up on it and that is one of the side effects.  She states she's getting her chest x-ray tomorrow.  Coleen [phone]

## 2021-08-16 NOTE — Telephone Encounter (Signed)
Patient's questions were answered. She will continue to use Breztri as ordered. She will follow up with this clinic in 1 month.

## 2021-08-19 DIAGNOSIS — J189 Pneumonia, unspecified organism: Secondary | ICD-10-CM | POA: Diagnosis not present

## 2021-08-19 DIAGNOSIS — B370 Candidal stomatitis: Secondary | ICD-10-CM | POA: Diagnosis not present

## 2021-08-19 DIAGNOSIS — B3731 Acute candidiasis of vulva and vagina: Secondary | ICD-10-CM | POA: Diagnosis not present

## 2021-08-21 DIAGNOSIS — F339 Major depressive disorder, recurrent, unspecified: Secondary | ICD-10-CM | POA: Diagnosis not present

## 2021-08-21 DIAGNOSIS — F4010 Social phobia, unspecified: Secondary | ICD-10-CM | POA: Diagnosis not present

## 2021-08-21 DIAGNOSIS — F410 Panic disorder [episodic paroxysmal anxiety] without agoraphobia: Secondary | ICD-10-CM | POA: Diagnosis not present

## 2021-09-10 DIAGNOSIS — N950 Postmenopausal bleeding: Secondary | ICD-10-CM | POA: Diagnosis not present

## 2021-09-12 DIAGNOSIS — R202 Paresthesia of skin: Secondary | ICD-10-CM | POA: Diagnosis not present

## 2021-09-12 DIAGNOSIS — H9313 Tinnitus, bilateral: Secondary | ICD-10-CM | POA: Insufficient documentation

## 2021-09-12 DIAGNOSIS — G43909 Migraine, unspecified, not intractable, without status migrainosus: Secondary | ICD-10-CM | POA: Insufficient documentation

## 2021-09-12 DIAGNOSIS — F32A Depression, unspecified: Secondary | ICD-10-CM | POA: Insufficient documentation

## 2021-09-12 DIAGNOSIS — R2689 Other abnormalities of gait and mobility: Secondary | ICD-10-CM | POA: Diagnosis not present

## 2021-09-12 DIAGNOSIS — R251 Tremor, unspecified: Secondary | ICD-10-CM | POA: Diagnosis not present

## 2021-09-12 DIAGNOSIS — E559 Vitamin D deficiency, unspecified: Secondary | ICD-10-CM | POA: Diagnosis not present

## 2021-09-12 DIAGNOSIS — R208 Other disturbances of skin sensation: Secondary | ICD-10-CM | POA: Diagnosis not present

## 2021-09-24 DIAGNOSIS — R202 Paresthesia of skin: Secondary | ICD-10-CM | POA: Diagnosis not present

## 2021-10-09 DIAGNOSIS — F4010 Social phobia, unspecified: Secondary | ICD-10-CM | POA: Diagnosis not present

## 2021-10-09 DIAGNOSIS — F339 Major depressive disorder, recurrent, unspecified: Secondary | ICD-10-CM | POA: Diagnosis not present

## 2021-10-09 DIAGNOSIS — F410 Panic disorder [episodic paroxysmal anxiety] without agoraphobia: Secondary | ICD-10-CM | POA: Diagnosis not present

## 2021-10-09 DIAGNOSIS — F909 Attention-deficit hyperactivity disorder, unspecified type: Secondary | ICD-10-CM | POA: Diagnosis not present

## 2021-10-11 NOTE — Progress Notes (Deleted)
Cardiology Office Note   Date:  10/11/2021   ID:  Lasharn, Buf[place] [birth year], MRN [id number]  PCP:  Dian Queen, MD  Cardiologist: Dr. Ellyn Hack No chief complaint on file.    History of Present Illness: Sarah Reeves is a 60 y.o. female who presents for ongoing assessment and management of chronic chest pain with a low risk Myoview in 2016, hypertension, tobacco abuse, chronic anxiety.  Was last seen in the office by Kerin Ransom, PA, on 12/27/2019.  She endorsed a lot of stress that she was taking care of her husband with dementia as his primary caregiver.  She remained active working out on her elliptical.  She she did complain of mild chest discomfort at rest at which time she takes aspirin and sometimes nitroglycerin with some relief.  No new testing, medication changes, or additions were made at that office visit.    Past Medical History:  Diagnosis Date   Angio-edema    Anxiety    Asthma    Complication of anesthesia    takes alot to sedated   Depression    Fibromyalgia    GERD (gastroesophageal reflux disease)    Hepatitis C    (Dr. Earlean Shawl) Treated with Harvoni March-May 2016   Herpes simplex    Hyperlipemia    Insomnia    Mental disorder    Neuropathy    PONV (postoperative nausea and vomiting)    Urticaria     Past Surgical History:  Procedure Laterality Date   BREAST ENHANCEMENT SURGERY     BREAST LUMPECTOMY WITH RADIOACTIVE SEED LOCALIZATION Left 12/26/2014   Procedure: LEFT BREAST LUMPECTOMY WITH RADIOACTIVE SEED LOCALIZATION;  Surgeon: Autumn Messing III, MD;  Location: [facility];  Service: General;  Laterality: Left;   BREAST REDUCTION SURGERY     COLONOSCOPY     EXPLORATORY LAPAROTOMY     NM MYOVIEW LTD  07/07/15   Normal Myocardial Perfusion Scan. Low risk lexiscan nuclear study with minimal insignificant breast attenuation and normal myocardial perfusion and function; EF 53% without wall motion abnormalities and normal systolic thickening    TEMPOROMANDIBULAR JOINT SURGERY     TUBAL LIGATION     UPPER GI ENDOSCOPY     x5     Current Outpatient Medications  Medication Sig Dispense Refill   amoxicillin-clavulanate (AUGMENTIN) 875-125 MG tablet Take 1 tablet by mouth every 12 (twelve) hours. 14 tablet 0   Ascorbic Acid (VITAMIN C) 1000 MG tablet Take 1,000 mg by mouth daily.     aspirin EC 81 MG tablet Take 1 tablet (81 mg total) by mouth daily. 90 tablet 3   Budeson-Glycopyrrol-Formoterol (BREZTRI AEROSPHERE) 160-9-4.8 MCG/ACT AERO Inhale 2 puffs into the lungs in the morning and at bedtime. 10.7 g 5   cetirizine (ZYRTEC) 10 MG tablet Take 10 mg by mouth daily.     chlorpheniramine-HYDROcodone (TUSSIONEX) 10-8 MG/5ML SUER hydrocodone 10 mg-chlorpheniramine 8 mg/5 mL oral susp extend.rel 12hr  Take 5 mL every 12 hours by oral route.     cholecalciferol (VITAMIN D) 1000 UNITS tablet Take 2,000 Units by mouth daily.      cyclobenzaprine (FLEXERIL) 10 MG tablet Take 10 mg by mouth 3 (three) times daily as needed for muscle spasms.     Dextromethorphan-guaiFENesin (TUSSIN DM) 10-100 MG/5ML liquid Take 5 mLs by mouth every 12 (twelve) hours. 200 mL 0   doxycycline (VIBRAMYCIN) 100 MG capsule Take 1 capsule (100 mg total) by mouth 2 (two) times daily. 20 capsule 0  DULoxetine (CYMBALTA) 30 MG capsule Take 30 mg by mouth daily.     estradiol (VIVELLE-DOT) 0.075 MG/24HR Place onto the skin.     gabapentin (NEURONTIN) 300 MG capsule Take 300 mg by mouth daily as needed (nerve pain).     HYDROcodone-acetaminophen (NORCO) 10-325 MG tablet Take 1 tablet by mouth every 4 (four) hours as needed.     levalbuterol (XOPENEX HFA) 45 MCG/ACT inhaler Inhale 2 puffs into the lungs every 4 (four) hours as needed for wheezing. 15 g 1   levalbuterol (XOPENEX) 1.25 MG/3ML nebulizer solution Take 1.25 mg by nebulization every 4 (four) hours as needed for wheezing. 72 mL 5   LORazepam (ATIVAN) 2 MG tablet Take 2 mg by mouth in the morning, at noon, and  at bedtime.     meclizine (ANTIVERT) 25 MG tablet Take 25 mg by mouth 3 (three) times daily as needed for dizziness.     montelukast (SINGULAIR) 10 MG tablet Take 1 tablet (10 mg total) by mouth at bedtime. 30 tablet 5   omeprazole (PRILOSEC) 20 MG capsule Take 1 capsule (20 mg total) by mouth daily. 30 capsule 0   progesterone (PROMETRIUM) 100 MG capsule Take 100 mg by mouth at bedtime.     promethazine (PHENERGAN) 25 MG tablet Take 25 mg by mouth every 6 (six) hours as needed for nausea or vomiting.     triamterene-hydrochlorothiazide (DYAZIDE) 37.5-25 MG capsule Take 1 capsule by mouth daily.     valACYclovir (VALTREX) 500 MG tablet Take 500 mg by mouth daily as needed (outbreaks).     ziprasidone (GEODON) 40 MG capsule Take 40 mg by mouth daily.     zolpidem (AMBIEN) 10 MG tablet Take 10 mg by mouth at bedtime.     No current facility-administered medications for this visit.   Facility-Administered Medications Ordered in Other Visits  Medication Dose Route Frequency Provider Last Rate Last Admin   chlorhexidine (HIBICLENS) 4 % liquid 1 application  1 application Topical Once Autumn Messing III, MD       chlorhexidine (HIBICLENS) 4 % liquid 1 application  1 application Topical Once Autumn Messing III, MD        Allergies:   Jeanie Cooks allergy], Zoloft [sertraline hcl], Latex, Amitriptyline, Clindamycin, Clindamycin/lincomycin, Other, Penicillins, Pentosan polysulfate sodium, Sulfa antibiotics, Topamax [topiramate], Uribel [meth-hyo-m bl-na phos-ph sal], Uribel [urelle], Lamotrigine, and Sulfamethoxazole-trimethoprim    Social History:  The patient  reports that she has been smoking cigarettes. She has been smoking an average of .5 packs per day. She has never used smokeless tobacco. She reports that she does not drink alcohol and does not use drugs.   Family History:  The patient's family history includes Alzheimer's disease in her maternal aunt and maternal grandmother; Breast cancer in  her mother; Colon cancer in her father; Diabetes in her mother; Heart disease in her father and mother; Hypothyroidism in her brother; Lung disease in her mother; Skin cancer in her maternal aunt.    ROS: All other systems are reviewed and negative. Unless otherwise mentioned in H&P    PHYSICAL EXAM: VS:  There were no vitals taken for this visit. , BMI There is no height or weight on file to calculate BMI. GEN: Well nourished, well developed, in no acute distress HEENT: normal Neck: no JVD, carotid bruits, or masses Cardiac: ***RRR; no murmurs, rubs, or gallops,no edema  Respiratory:  Clear to auscultation bilaterally, normal work of breathing GI: soft, nontender, nondistended, + BS MS: no deformity or  atrophy Skin: warm and dry, no rash Neuro:  Strength and sensation are intact Psych: euthymic mood, full affect   EKG:  EKG {ACTION; IS/IS [id number]} ordered today. The ekg ordered today demonstrates ***   Recent Labs: 03/09/2021: B Natriuretic Peptide 66.0 08/09/2021: ALT 14; BUN 8; Creatinine, Ser 0.67; Hemoglobin 15.1; Platelets 254; Potassium 3.9; Sodium 134    Lipid Panel    Component Value Date/Time   CHOL 223 (H) 11/10/2018 1014   TRIG 125 11/10/2018 1014   HDL 78 11/10/2018 1014   CHOLHDL 2.9 11/10/2018 1014   CHOLHDL 3.3 06/27/2018 0623   VLDL 23 06/27/2018 0623   LDLCALC 120 (H) 11/10/2018 1014      Wt Readings from Last 3 Encounters:  08/09/21 154 lb (69.9 kg)  04/05/21 157 lb 9.6 oz (71.5 kg)  03/10/21 161 lb 9.6 oz (73.3 kg)      Other studies Reviewed: NM Stress test 2015-08-04 Nuclear stress EF: 53%. The left ventricular ejection fraction is mildly decreased (45-54%). There was no ST segment deviation noted during stress. Normal myocardial perfusion and function.   Low risk lexiscan nuclear study with minimal insignificant breast attenuation and normal myocardial perfusion and function; EF 53% without wall motion abnormalities and normal systolic  thickening.  ASSESSMENT AND PLAN:  1.  ***   Current medicines are reviewed at length with the patient today.  I have spent *** dedicated to the care of this patient on the date of this encounter to include pre-visit review of records, assessment, management and diagnostic testing,with shared decision making.  Labs/ tests ordered today include: *** Phill Myron. West Pugh, ANP, AACC   10/11/2021 4:33 PM    [facility] [place] [address] Office [phone] Fax [phone]  Notice: This dictation was prepared with Dragon dictation along with smaller phrase technology. Any transcriptional errors that result from this process are unintentional and may not be corrected upon review.

## 2021-10-12 ENCOUNTER — Ambulatory Visit: Payer: BC Managed Care – PPO | Admitting: Adult Health

## 2021-10-16 ENCOUNTER — Telehealth: Payer: Self-pay | Admitting: Family Medicine

## 2021-10-16 NOTE — Telephone Encounter (Signed)
Anne please advise   Patient called and said that she have thrust real bad and would like for you to send in something to [facility] on [address]. Barnett Applebaum [phone].

## 2021-10-16 NOTE — Telephone Encounter (Signed)
Can you please make an appointment for this patient for follow up in clinic please. Can double book on Thursday this week on my [place] [facility] schedule if needed. Thank you!

## 2021-10-16 NOTE — Telephone Encounter (Signed)
Patient called and said that she have thrust real bad and would like for you to send in something to [facility] on [address]. [phone].

## 2021-10-18 ENCOUNTER — Other Ambulatory Visit: Payer: Self-pay

## 2021-10-18 ENCOUNTER — Encounter: Payer: Self-pay | Admitting: Family Medicine

## 2021-10-18 ENCOUNTER — Ambulatory Visit (INDEPENDENT_AMBULATORY_CARE_PROVIDER_SITE_OTHER): Payer: BC Managed Care – PPO | Admitting: Family Medicine

## 2021-10-18 VITALS — BP 148/82 | HR 98 | Temp 98.2°F | Resp 20 | Ht 64.0 in | Wt 161.2 lb

## 2021-10-18 DIAGNOSIS — J449 Chronic obstructive pulmonary disease, unspecified: Secondary | ICD-10-CM

## 2021-10-18 DIAGNOSIS — K219 Gastro-esophageal reflux disease without esophagitis: Secondary | ICD-10-CM

## 2021-10-18 DIAGNOSIS — B370 Candidal stomatitis: Secondary | ICD-10-CM

## 2021-10-18 DIAGNOSIS — J310 Chronic rhinitis: Secondary | ICD-10-CM

## 2021-10-18 DIAGNOSIS — Z720 Tobacco use: Secondary | ICD-10-CM

## 2021-10-18 MED ORDER — OMEPRAZOLE 40 MG PO CPDR: 40.0000 mg | DELAYED_RELEASE_CAPSULE | Freq: Every day | ORAL | 5 refills | Status: DC

## 2021-10-18 MED ORDER — BREZTRI AEROSPHERE 160-9-4.8 MCG/ACT IN AERO: 2.0000 | INHALATION_SPRAY | Freq: Two times a day (BID) | RESPIRATORY_TRACT | 5 refills | Status: DC

## 2021-10-18 MED ORDER — LEVALBUTEROL TARTRATE 45 MCG/ACT IN AERO: 2.0000 | INHALATION_SPRAY | RESPIRATORY_TRACT | 1 refills | Status: DC | PRN

## 2021-10-18 MED ORDER — NYSTATIN 100000 UNIT/ML MT SUSP: 5.0000 mL | Freq: Four times a day (QID) | OROMUCOSAL | 0 refills | Status: DC

## 2021-10-18 MED ORDER — MONTELUKAST SODIUM 10 MG PO TABS: 10.0000 mg | ORAL_TABLET | Freq: Every day | ORAL | 5 refills | Status: DC

## 2021-10-18 MED ORDER — CETIRIZINE HCL 10 MG PO TABS: 10.0000 mg | ORAL_TABLET | Freq: Every day | ORAL | 5 refills | Status: DC

## 2021-10-18 MED ORDER — LEVALBUTEROL HCL 1.25 MG/3ML IN NEBU: 1.2500 mg | INHALATION_SOLUTION | RESPIRATORY_TRACT | 5 refills | Status: DC | PRN

## 2021-10-18 NOTE — Progress Notes (Signed)
[place] [address] Dept: [phone]  FOLLOW UP NOTE  Patient ID: Sarah Reeves, female    DOB: Mar 23, 1961  Age: 60 y.o. MRN: [id number] Date of Office Visit: 10/18/2021  Assessment  Chief Complaint: Follow-up (Patient states everything has been going well since last visit. Nothing new to report. ACT - 19)  HPI Sarah Reeves is a 60 year old female who presents clinic for follow-up visit.  She was last seen in this clinic on 08/07/2021 via televisit by Gareth Morgan, FNP, for evaluation of asthma with acute exacerbation, chronic rhinitis, reflux, and tobacco use.  At that time it was recommended she proceed with chest x-ray for evaluation of cough.  She presented to urgent care on 08/09/2021 and was provided with Augmentin for resolution of community-acquired pneumonia.  At today's visit, she reports her asthma has been moderately well controlled with symptoms including shortness of breath with activity, occasional wheeze occurring in the daytime, and cough producing thick mucus occurring in the morning.  She continues montelukast 10 mg once a day, Breztri 2 puffs once a day, and Symbicort 2 puffs once a day.  She continues Xopenex at least once a day as a regular habit, not necessarily for rescue from asthma symptoms.  Allergic rhinitis is reported as moderately well controlled with symptoms including clear rhinorrhea and occasional nasal congestion.  She continues cetirizine 10 mg once a day and is not currently using Flonase or nasal saline rinses.  Her last allergy testing was on 08/11/2018 and was negative to the adult environmental panel including intradermal testing.  Reflux is reported as moderately well controlled with heartburn occurring about once a week for which she takes omeprazole on most days.  She continues to smoke about 10 cigarettes a day.  Her current medications are listed in the chart.   Drug Allergies:  Allergies  Allergen Reactions   Oysters  [Shellfish Allergy] Other (See Comments)    Unknown reaction; "MD told me I was allergic"   Zoloft [Sertraline Hcl] Other (See Comments)    REACTION: Nightmares, Grind teeth really bad   Latex Itching    Itch and blisters from contact   Amitriptyline     Hot, itching, tongue tingling, breakthrough bleeding.   Clindamycin Other (See Comments)   Clindamycin/Lincomycin Hives and Other (See Comments)    Stated that this causes her to get really horse and made her feel really hot   Other Other (See Comments)   Penicillins    Pentosan Polysulfate Sodium Swelling    Throat swelling   Sulfa Antibiotics Other (See Comments)   Topamax [Topiramate]     Says it makes her "constipation and she can't urinate normally"   Uribel [Meth-Hyo-M Bl-Na Phos-Ph Sal] Nausea Only   Uribel [Urelle] Swelling and Other (See Comments)    Patient stated this makes her stomach swell and makes her feel si   Lamotrigine Rash   Sulfamethoxazole-Trimethoprim Rash    Patient developed feelings of dizziness, rash and itchy hands.  Patient then vomited and had diarrhea.    Physical Exam: BP (!) 148/82 (BP Location: Left Arm, Patient Position: Sitting)    Pulse 98    Temp 98.2 F (36.8 C) (Temporal)    Resp 20    Ht 5\' 4"  (1.626 m)    Wt 161 lb 3.2 oz (73.1 kg)    SpO2 97%    BMI 27.67 kg/m    Physical Exam Vitals reviewed.  Constitutional:  Appearance: Normal appearance.  HENT:     Head: Normocephalic and atraumatic.     Right Ear: Tympanic membrane normal.     Left Ear: Tympanic membrane normal.     Nose:     Comments: Bilateral nares slightly erythematous with clear nasal drainage noted.  Pharynx normal.  Ears normal.  Eyes normal.    Mouth/Throat:     Pharynx: Oropharynx is clear.  Eyes:     Conjunctiva/sclera: Conjunctivae normal.  Cardiovascular:     Rate and Rhythm: Normal rate and regular rhythm.     Heart sounds: Normal heart sounds. No murmur heard. Pulmonary:     Effort: Pulmonary effort is  normal.     Breath sounds: Normal breath sounds.     Comments: Slight bilateral expiratory wheeze which cleared post bronchodilator therapy Musculoskeletal:        General: Normal range of motion.     Cervical back: Normal range of motion and neck supple.  Skin:    General: Skin is warm and dry.  Neurological:     Mental Status: She is alert and oriented to person, place, and time.  Psychiatric:        Mood and Affect: Mood normal.        Behavior: Behavior normal.        Thought Content: Thought content normal.        Judgment: Judgment normal.    Diagnostics: FVC 2.71, FEV1 1.79.  Predicted FVC 3.19, predicted FEV1 2.52.  Spirometry indicates possible obstruction.  Postbronchodilator FVC 2.78, FEV1 1.72.  Postbronchodilator spirometry indicates no significant improvement  Assessment and Plan: 1. Asthma-COPD overlap syndrome ([place])   2. Oral candidiasis   3. Chronic rhinitis   4. Gastroesophageal reflux disease, unspecified whether esophagitis present   5. Tobacco use     Meds ordered this encounter  Medications   montelukast (SINGULAIR) 10 MG tablet    Sig: Take 1 tablet (10 mg total) by mouth at bedtime.    Dispense:  30 tablet    Refill:  5   Budeson-Glycopyrrol-Formoterol (BREZTRI AEROSPHERE) 160-9-4.8 MCG/ACT AERO    Sig: Inhale 2 puffs into the lungs in the morning and at bedtime.    Dispense:  10.7 g    Refill:  5   levalbuterol (XOPENEX HFA) 45 MCG/ACT inhaler    Sig: Inhale 2 puffs into the lungs every 4 (four) hours as needed for wheezing.    Dispense:  15 g    Refill:  1   levalbuterol (XOPENEX) 1.25 MG/3ML nebulizer solution    Sig: Take 1.25 mg by nebulization every 4 (four) hours as needed for wheezing.    Dispense:  72 mL    Refill:  5   cetirizine (ZYRTEC) 10 MG tablet    Sig: Take 1 tablet (10 mg total) by mouth daily.    Dispense:  30 tablet    Refill:  5   omeprazole (PRILOSEC) 40 MG capsule    Sig: Take 1 capsule (40 mg total) by mouth daily.     Dispense:  30 capsule    Refill:  5   nystatin (MYCOSTATIN) 100000 UNIT/ML suspension    Sig: Take 5 mLs (500,000 Units total) by mouth 4 (four) times daily.    Dispense:  60 mL    Refill:  0    Patient Instructions  Asthma Continue montelukast 10 mg once a day to prevent cough or wheeze Continue Breztri-2 puffs twice a day with a spacer to prevent cough  or wheeze. Rinse your mouth and spit after each use. Call the clinic if you experience thrush.  This will replace Symbicort Continue levalbuterol 2 puffs every 4 hours as needed for cough or wheeze OR Instead use albuterol 0.083% solution via nebulizer one unit vial every 4 hours as needed for cough or wheeze You may use levalbuterol 2 puffs 5-15 minutes before activity to decrease cough or wheeze  Thrush Begin nystatin swish and swallow 5 mL once every 4 hours for 7 days Use a spacer with your Breztri inhaler and rinse your mouth after each use of Breztri  Chronic rhinitis Continue cetirizine 10 mg once a day as needed for runny nose or itch Continue Flonase 2 sprays in each nostril once a day as needed for stuffy nose.  In the right nostril, point the applicator out toward the right ear. In the left nostril, point the applicator out toward the left ear Begin saline nasal rinses as needed for nasal symptoms. Use this before any medicated nasal sprays for best result  Reflux Continue omeprazole 40 mg once a day to prevent reflux.  Take this medication 30 minutes before your first meal of the day.  This will replace his omeprazole Continue dietary and lifestyle modifications as listed below  Tobacco use Try to cut down and quit smoking Call the clinic if this treatment plan is not working well for you  Follow up in the clinic in 3 months or sooner if needed.   Return in about 3 months (around 01/16/2022), or if symptoms worsen or fail to improve.    Thank you for the opportunity to care for this patient.  Please do not hesitate to  contact me with questions.  Gareth Morgan, FNP [facility]

## 2021-10-18 NOTE — Telephone Encounter (Signed)
Patient was seen in clinic Thursday 10/18/21

## 2021-10-18 NOTE — Patient Instructions (Addendum)
Asthma Continue montelukast 10 mg once a day to prevent cough or wheeze Continue Breztri-2 puffs twice a day with a spacer to prevent cough or wheeze. Rinse your mouth and spit after each use. Call the clinic if you experience thrush.  This will replace Symbicort Continue levalbuterol 2 puffs every 4 hours as needed for cough or wheeze OR Instead use albuterol 0.083% solution via nebulizer one unit vial every 4 hours as needed for cough or wheeze You may use levalbuterol 2 puffs 5-15 minutes before activity to decrease cough or wheeze  Thrush Begin nystatin swish and swallow 5 mL once every 4 hours for 7 days Use a spacer with your Breztri inhaler and rinse your mouth after each use of Breztri  Chronic rhinitis Continue cetirizine 10 mg once a day as needed for runny nose or itch Continue Flonase 2 sprays in each nostril once a day as needed for stuffy nose.  In the right nostril, point the applicator out toward the right ear. In the left nostril, point the applicator out toward the left ear Begin saline nasal rinses as needed for nasal symptoms. Use this before any medicated nasal sprays for best result  Reflux Continue omeprazole 40 mg once a day to prevent reflux.  Take this medication 30 minutes before your first meal of the day.  This will replace his omeprazole Continue dietary and lifestyle modifications as listed below  Tobacco use Try to cut down and quit smoking Call the clinic if this treatment plan is not working well for you  Follow up in the clinic in 3 months or sooner if needed.  Lifestyle Changes for Controlling GERD When you have GERD, stomach acid feels as if its backing up toward your mouth. Whether or not you take medication to control your GERD, your symptoms can often be improved with lifestyle changes.   Raise Your Head Reflux is more likely to strike when youre lying down flat, because stomach fluid can flow backward more easily. Raising the head of your bed  4-6 inches can help. To do this: Slide blocks or books under the legs at the head of your bed. Or, place a wedge under the mattress. Many foam stores can make a suitable wedge for you. The wedge should run from your waist to the top of your head. Dont just prop your head on several pillows. This increases pressure on your stomach. It can make GERD worse.  Watch Your Eating Habits Certain foods may increase the acid in your stomach or relax the lower esophageal sphincter, making GERD more likely. Its best to avoid the following: Coffee, tea, and carbonated drinks (with and without caffeine) Fatty, fried, or spicy food Mint, chocolate, onions, and tomatoes Any other foods that seem to irritate your stomach or cause you pain  Relieve the Pressure Eat smaller meals, even if you have to eat more often. Dont lie down right after you eat. Wait a few hours for your stomach to empty. Avoid tight belts and tight-fitting clothes. Lose excess weight.  Tobacco and Alcohol Avoid smoking tobacco and drinking alcohol. They can make GERD symptoms worse.

## 2021-10-26 ENCOUNTER — Ambulatory Visit: Payer: BC Managed Care – PPO | Admitting: Family Medicine

## 2021-10-27 DIAGNOSIS — G373 Acute transverse myelitis in demyelinating disease of central nervous system: Secondary | ICD-10-CM | POA: Diagnosis not present

## 2021-10-27 DIAGNOSIS — R202 Paresthesia of skin: Secondary | ICD-10-CM | POA: Diagnosis not present

## 2021-11-07 DIAGNOSIS — G373 Acute transverse myelitis in demyelinating disease of central nervous system: Secondary | ICD-10-CM | POA: Diagnosis not present

## 2021-11-07 DIAGNOSIS — Z0389 Encounter for observation for other suspected diseases and conditions ruled out: Secondary | ICD-10-CM | POA: Diagnosis not present

## 2021-11-07 DIAGNOSIS — R202 Paresthesia of skin: Secondary | ICD-10-CM | POA: Diagnosis not present

## 2021-11-16 ENCOUNTER — Emergency Department (HOSPITAL_COMMUNITY)
Admission: EM | Admit: 2021-11-16 | Discharge: 2021-11-16 | Disposition: A | Discharge status: Home / Self Care | Payer: BLUE CROSS/BLUE SHIELD | Attending: Emergency Medicine | Admitting: Emergency Medicine

## 2021-11-16 ENCOUNTER — Other Ambulatory Visit: Payer: Self-pay

## 2021-11-16 ENCOUNTER — Encounter (HOSPITAL_COMMUNITY): Payer: Self-pay

## 2021-11-16 DIAGNOSIS — R030 Elevated blood-pressure reading, without diagnosis of hypertension: Secondary | ICD-10-CM | POA: Diagnosis not present

## 2021-11-16 DIAGNOSIS — F419 Anxiety disorder, unspecified: Secondary | ICD-10-CM | POA: Insufficient documentation

## 2021-11-16 DIAGNOSIS — Z5321 Procedure and treatment not carried out due to patient leaving prior to being seen by health care provider: Secondary | ICD-10-CM | POA: Insufficient documentation

## 2021-11-16 DIAGNOSIS — R1011 Right upper quadrant pain: Secondary | ICD-10-CM | POA: Diagnosis not present

## 2021-11-16 DIAGNOSIS — R110 Nausea: Secondary | ICD-10-CM | POA: Insufficient documentation

## 2021-11-16 HISTORY — DX: Chronic obstructive pulmonary disease, unspecified: J44.9

## 2021-11-16 LAB — CBC
HCT: 43.7 % (ref 36.0–46.0)
Hemoglobin: 14.4 g/dL (ref 12.0–15.0)
MCH: 32 pg (ref 26.0–34.0)
MCHC: 33 g/dL (ref 30.0–36.0)
MCV: 97.1 fL (ref 80.0–100.0)
Platelets: 232 10*3/uL (ref 150–400)
RBC: 4.5 MIL/uL (ref 3.87–5.11)
RDW: 13 % (ref 11.5–15.5)
WBC: 9.8 10*3/uL (ref 4.0–10.5)
nRBC: 0 % (ref 0.0–0.2)

## 2021-11-16 LAB — COMPREHENSIVE METABOLIC PANEL
ALT: 16 U/L (ref 0–44)
AST: 18 U/L (ref 15–41)
Albumin: 4.3 g/dL (ref 3.5–5.0)
Alkaline Phosphatase: 52 U/L (ref 38–126)
Anion gap: 8 (ref 5–15)
BUN: 8 mg/dL (ref 6–20)
CO2: 27 mmol/L (ref 22–32)
Calcium: 9.3 mg/dL (ref 8.9–10.3)
Chloride: 96 mmol/L — ABNORMAL LOW (ref 98–111)
Creatinine, Ser: 0.73 mg/dL (ref 0.44–1.00)
GFR, Estimated: >60 mL/min (ref >60)
Glucose, Bld: 121 mg/dL — ABNORMAL HIGH (ref 70–99)
Potassium: 3.7 mmol/L (ref 3.5–5.1)
Sodium: 131 mmol/L — ABNORMAL LOW (ref 135–145)
Total Bilirubin: 0.6 mg/dL (ref 0.3–1.2)
Total Protein: 7.1 g/dL (ref 6.5–8.1)

## 2021-11-16 LAB — LIPASE, BLOOD: Lipase: 22 U/L (ref 11–51)

## 2021-11-16 LAB — URINALYSIS, ROUTINE W REFLEX MICROSCOPIC
Bilirubin Urine: NEGATIVE
Glucose, UA: NEGATIVE mg/dL
Hgb urine dipstick: NEGATIVE
Ketones, ur: NEGATIVE mg/dL
Leukocytes,Ua: NEGATIVE
Nitrite: NEGATIVE
Protein, ur: NEGATIVE mg/dL
Specific Gravity, Urine: 1.003 — ABNORMAL LOW (ref 1.005–1.030)
pH: 6 (ref 5.0–8.0)

## 2021-11-16 NOTE — ED Triage Notes (Signed)
Per EMS- Patient is from home. Patient c/o RUQ abdominal pain x 4 days. Patient also c/o anxiety. EMS reported that the patient took Xanax 2 tabs and was only prescribed 1 tab.  EMS reports a bruise to the left back from a previous lumbar puncture.

## 2021-11-16 NOTE — ED Provider Triage Note (Signed)
Emergency Medicine Provider Triage Evaluation Note  Sarah Reeves , a 61 y.o. female  was evaluated in triage.  Pt complains of RUQ pain, hx hep C in remission, onset 4 days ago. Pain is intermittent, feels like her BP gets high and then the pain starts, nagging in nature. Took hydrocodone at 4am. Took 3 motrin for HA earlier this am as well.  Prior tubal ligation, no other abdominal surgeries.   Review of Systems  Positive: RUQ pain, nausea Negative: Fevers, vomiting, changes in bowel or bladder habits  Physical Exam  BP (!) 160/76 (BP Location: Right Arm)    Pulse 86    Temp 98.8 F (37.1 C) (Oral)    Resp 16    Ht 5\' 4"  (1.626 m)    Wt 69.9 kg    SpO2 98%    BMI 26.43 kg/m  Gen:   Awake, no distress, appears uncomfortable  Resp:  Normal effort  MSK:   Moves extremities without difficulty  Other:    Medical Decision Making  Medically screening exam initiated at 10:52 AM.  Appropriate orders placed.  Sarah Reeves was informed that the remainder of the evaluation will be completed by another provider, this initial triage assessment does not replace that evaluation, and the importance of remaining in the ED until their evaluation is complete.     Tacy Learn, PA-C 11/16/21 1054

## 2021-11-20 DIAGNOSIS — Z6827 Body mass index (BMI) 27.0-27.9, adult: Secondary | ICD-10-CM | POA: Diagnosis not present

## 2021-11-20 DIAGNOSIS — R1011 Right upper quadrant pain: Secondary | ICD-10-CM | POA: Diagnosis not present

## 2021-11-20 DIAGNOSIS — Z1231 Encounter for screening mammogram for malignant neoplasm of breast: Secondary | ICD-10-CM | POA: Diagnosis not present

## 2021-11-20 DIAGNOSIS — Z01419 Encounter for gynecological examination (general) (routine) without abnormal findings: Secondary | ICD-10-CM | POA: Diagnosis not present

## 2021-11-21 ENCOUNTER — Other Ambulatory Visit: Payer: Self-pay | Admitting: Obstetrics and Gynecology

## 2021-11-21 DIAGNOSIS — R928 Other abnormal and inconclusive findings on diagnostic imaging of breast: Secondary | ICD-10-CM

## 2021-11-30 ENCOUNTER — Telehealth: Payer: Self-pay | Admitting: Family Medicine

## 2021-11-30 NOTE — Telephone Encounter (Signed)
Spoke with patient and informed her that refills were sent to the requested pharmacy on 10/18/2021. Patient stated she never received them. I asked if she called the pharmacy and she informed me that she did not. I informed patient to call [facility] and have them refill her Montelukast and Xopenex inhaler. Patient verbalized understanding.

## 2021-11-30 NOTE — Telephone Encounter (Signed)
Sarah Reeves would like refills called in to [facility] on [address] for Singulair and Xopenex inhaler.

## 2021-12-01 ENCOUNTER — Ambulatory Visit: Payer: BLUE CROSS/BLUE SHIELD

## 2021-12-01 ENCOUNTER — Ambulatory Visit
Admission: RE | Admit: 2021-12-01 | Discharge: 2021-12-01 | Disposition: A | Discharge status: Home / Self Care | Payer: BLUE CROSS/BLUE SHIELD | Source: Ambulatory Visit | Attending: Obstetrics and Gynecology | Admitting: Obstetrics and Gynecology

## 2021-12-01 DIAGNOSIS — R928 Other abnormal and inconclusive findings on diagnostic imaging of breast: Secondary | ICD-10-CM

## 2021-12-21 DIAGNOSIS — N930 Postcoital and contact bleeding: Secondary | ICD-10-CM | POA: Diagnosis not present

## 2021-12-21 DIAGNOSIS — N951 Menopausal and female climacteric states: Secondary | ICD-10-CM | POA: Diagnosis not present

## 2021-12-27 DIAGNOSIS — F339 Major depressive disorder, recurrent, unspecified: Secondary | ICD-10-CM | POA: Diagnosis not present

## 2021-12-27 DIAGNOSIS — F410 Panic disorder [episodic paroxysmal anxiety] without agoraphobia: Secondary | ICD-10-CM | POA: Diagnosis not present

## 2021-12-27 DIAGNOSIS — F4010 Social phobia, unspecified: Secondary | ICD-10-CM | POA: Diagnosis not present

## 2021-12-27 DIAGNOSIS — F909 Attention-deficit hyperactivity disorder, unspecified type: Secondary | ICD-10-CM | POA: Diagnosis not present

## 2022-01-01 ENCOUNTER — Emergency Department (HOSPITAL_BASED_OUTPATIENT_CLINIC_OR_DEPARTMENT_OTHER): Payer: BC Managed Care – PPO

## 2022-01-01 ENCOUNTER — Emergency Department (HOSPITAL_BASED_OUTPATIENT_CLINIC_OR_DEPARTMENT_OTHER)
Admission: EM | Admit: 2022-01-01 | Discharge: 2022-01-01 | Disposition: A | Discharge status: Home / Self Care | Payer: BC Managed Care – PPO | Attending: Emergency Medicine | Admitting: Emergency Medicine

## 2022-01-01 ENCOUNTER — Encounter (HOSPITAL_BASED_OUTPATIENT_CLINIC_OR_DEPARTMENT_OTHER): Payer: Self-pay | Admitting: Emergency Medicine

## 2022-01-01 ENCOUNTER — Other Ambulatory Visit: Payer: Self-pay

## 2022-01-01 DIAGNOSIS — Z7982 Long term (current) use of aspirin: Secondary | ICD-10-CM | POA: Insufficient documentation

## 2022-01-01 DIAGNOSIS — S52592A Other fractures of lower end of left radius, initial encounter for closed fracture: Secondary | ICD-10-CM

## 2022-01-01 DIAGNOSIS — J45909 Unspecified asthma, uncomplicated: Secondary | ICD-10-CM | POA: Diagnosis not present

## 2022-01-01 DIAGNOSIS — Z91040 Latex allergy status: Secondary | ICD-10-CM | POA: Diagnosis not present

## 2022-01-01 DIAGNOSIS — W010XXA Fall on same level from slipping, tripping and stumbling without subsequent striking against object, initial encounter: Secondary | ICD-10-CM | POA: Diagnosis not present

## 2022-01-01 DIAGNOSIS — Y9289 Other specified places as the place of occurrence of the external cause: Secondary | ICD-10-CM | POA: Insufficient documentation

## 2022-01-01 DIAGNOSIS — S52502A Unspecified fracture of the lower end of left radius, initial encounter for closed fracture: Secondary | ICD-10-CM | POA: Diagnosis not present

## 2022-01-01 DIAGNOSIS — Z7951 Long term (current) use of inhaled steroids: Secondary | ICD-10-CM | POA: Diagnosis not present

## 2022-01-01 DIAGNOSIS — S52572A Other intraarticular fracture of lower end of left radius, initial encounter for closed fracture: Secondary | ICD-10-CM | POA: Diagnosis not present

## 2022-01-01 DIAGNOSIS — S6992XA Unspecified injury of left wrist, hand and finger(s), initial encounter: Secondary | ICD-10-CM | POA: Diagnosis not present

## 2022-01-01 DIAGNOSIS — J449 Chronic obstructive pulmonary disease, unspecified: Secondary | ICD-10-CM | POA: Diagnosis not present

## 2022-01-01 MED ORDER — IBUPROFEN 400 MG PO TABS
600.0000 mg | ORAL_TABLET | Freq: Once | ORAL | Status: AC
  Administered 2022-01-01: 600 mg via ORAL
  Filled 2022-01-01: qty 1

## 2022-01-01 MED ORDER — HYDROCODONE-ACETAMINOPHEN 5-325 MG PO TABS
1.0000 | ORAL_TABLET | Freq: Once | ORAL | Status: AC
  Administered 2022-01-01: 1 via ORAL
  Filled 2022-01-01: qty 1

## 2022-01-01 NOTE — ED Notes (Signed)
Will provided DC instructions and AVS after splint is applied.  ?

## 2022-01-01 NOTE — ED Triage Notes (Signed)
Pt arrives pov, steady gait with stand by assist, c/o mechanical fall last night, slipped on ice, c/o left wrist and forearm pain. Swelling, possible deformity noted ?

## 2022-01-01 NOTE — ED Notes (Signed)
Left digits capillary refill WNL, color WNL, temp WNL, sensation - states all fingers on left hand have a tingling sensation. Left radial WNL ?

## 2022-01-01 NOTE — ED Provider Notes (Signed)
?[facility] EMER[facility]ENCY DEPARTMENT ?Provider Note ? ? ?CSN: [id number] ?Arrival date & time: 01/01/22  1048 ? ?  ? ?History ? ?Chief Complaint  ?Patient presents with  ? Fall  ? ? ?Sarah Reeves is a 61 y.o. female. ? ?Pt is a 61 yo female with a hx of chronic pain, asthma, anxiety, depression, hyperlipidemia, fibromyalgia, gerd, and copd.  She said she slipped on some oil in the garage last night and fell on her left wrist.  She is right handed.  She has pain and swelling in her wrist.  She denies any other injuries. ? ? ?  ? ?Home Medications ?Prior to Admission medications   ?Medication Sig Start Date End Date Taking? Authorizing Provider  ?amoxicillin-clavulanate (AUGMENTIN) 875-125 MG tablet Take 1 tablet by mouth every 12 (twelve) hours. 08/09/21   Tacy Learn, PA-C  ?Ascorbic Acid (VITAMIN C) 1000 MG tablet Take 1,000 mg by mouth daily.    [provider]  ?aspirin EC 81 MG tablet Take 1 tablet (81 mg total) by mouth daily. 08/04/18   Leonie Man, MD  ?Budeson-Glycopyrrol-Formoterol (BREZTRI AEROSPHERE) 160-9-4.8 MCG/ACT AERO Inhale 2 puffs into the lungs in the morning and at bedtime. 10/18/21   Dara Hoyer, FNP  ?cetirizine (ZYRTEC) 10 MG tablet Take 1 tablet (10 mg total) by mouth daily. 10/18/21   Dara Hoyer, FNP  ?chlorpheniramine-HYDROcodone (TUSSIONEX) 10-8 MG/5ML SUER hydrocodone 10 mg-chlorpheniramine 8 mg/5 mL oral susp extend.rel 12hr ? Take 5 mL every 12 hours by oral route. ?Patient not taking: Reported on 10/18/2021    [provider]  ?cholecalciferol (VITAMIN D) 1000 UNITS tablet Take 2,000 Units by mouth daily.     [provider]  ?cyclobenzaprine (FLEXERIL) 10 MG tablet Take 10 mg by mouth 3 (three) times daily as needed for muscle spasms.    [provider]  ?Dextromethorphan-guaiFENesin (TUSSIN DM) 10-100 MG/5ML liquid Take 5 mLs by mouth every 12 (twelve) hours. ?Patient not taking: Reported on 10/18/2021 08/09/21   Suella Broad  A, PA-C  ?doxycycline (VIBRAMYCIN) 100 MG capsule Take 1 capsule (100 mg total) by mouth 2 (two) times daily. ?Patient not taking: Reported on 10/18/2021 08/09/21   Suella Broad A, PA-C  ?DULoxetine (CYMBALTA) 30 MG capsule Take 30 mg by mouth daily. 11/29/19   [provider]  ?estradiol (VIVELLE-DOT) 0.075 MG/24HR Place onto the skin. 07/30/21   [provider]  ?gabapentin (NEURONTIN) 300 MG capsule Take 300 mg by mouth daily as needed (nerve pain). 11/29/19   [provider]  ?HYDROcodone-acetaminophen (NORCO) 10-325 MG tablet Take 1 tablet by mouth every 4 (four) hours as needed. 07/27/21   [provider]  ?levalbuterol Penne Lash HFA) 45 MCG/ACT inhaler Inhale 2 puffs into the lungs every 4 (four) hours as needed for wheezing. 10/18/21   Dara Hoyer, FNP  ?levalbuterol (XOPENEX) 1.25 MG/3ML nebulizer solution Take 1.25 mg by nebulization every 4 (four) hours as needed for wheezing. 10/18/21   Dara Hoyer, FNP  ?LORazepam (ATIVAN) 2 MG tablet Take 2 mg by mouth in the morning, at noon, and at bedtime. 12/17/19   [provider]  ?meclizine (ANTIVERT) 25 MG tablet Take 25 mg by mouth 3 (three) times daily as needed for dizziness.    [provider]  ?montelukast (SINGULAIR) 10 MG tablet Take 1 tablet (10 mg total) by mouth at bedtime. 10/18/21   Dara Hoyer, FNP  ?nystatin (MYCOSTATIN) 100000 UNIT/ML suspension Take 5 mLs (500,000 Units  total) by mouth 4 (four) times daily. 10/18/21   Dara Hoyer, FNP  ?omeprazole (PRILOSEC) 40 MG capsule Take 1 capsule (40 mg total) by mouth daily. 10/18/21   Dara Hoyer, FNP  ?progesterone (PROMETRIUM) 100 MG capsule Take 100 mg by mouth at bedtime. 12/15/20   [provider]  ?promethazine (PHENERGAN) 25 MG tablet Take 25 mg by mouth every 6 (six) hours as needed for nausea or vomiting.    [provider]  ?triamterene-hydrochlorothiazide (DYAZIDE) 37.5-25 MG capsule Take 1 capsule by mouth daily. 11/16/19    [provider]  ?valACYclovir (VALTREX) 500 MG tablet Take 500 mg by mouth daily as needed (outbreaks). 11/16/19   [provider]  ?ziprasidone (GEODON) 40 MG capsule Take 40 mg by mouth daily.    [provider]  ?zolpidem (AMBIEN) 10 MG tablet Take 10 mg by mouth at bedtime. 03/20/21   [provider]  ?   ? ?Allergies    ?Oysters [shellfish allergy], Zoloft [sertraline hcl], Latex, Amitriptyline, Clindamycin, Clindamycin/lincomycin, Other, Penicillins, Pentosan polysulfate sodium, Sulfa antibiotics, Topamax [topiramate], Uribel [meth-hyo-m bl-na phos-ph sal], Uribel [urelle], Lamotrigine, and Sulfamethoxazole-trimethoprim   ? ?Review of Systems   ?Review of Systems  ?Musculoskeletal:   ?     Left wrist pain  ?All other systems reviewed and are negative. ? ?Physical Exam ?Updated Vital Signs ?BP 130/85 (BP Location: Right Arm)   Pulse 80   Temp 98.3 ?F (36.8 ?C) (Oral)   Resp 16   Ht '5\' 4"'$  (1.626 m)   Wt 72.6 kg   SpO2 97%   BMI 27.46 kg/m?  ?Physical Exam ?Vitals and nursing note reviewed.  ?Constitutional:   ?   Appearance: Normal appearance.  ?HENT:  ?   Head: Normocephalic and atraumatic.  ?   Right Ear: External ear normal.  ?   Left Ear: External ear normal.  ?   Nose: Nose normal.  ?   Mouth/Throat:  ?   Mouth: Mucous membranes are moist.  ?   Pharynx: Oropharynx is clear.  ?Eyes:  ?   Extraocular Movements: Extraocular movements intact.  ?   Conjunctiva/sclera: Conjunctivae normal.  ?   Pupils: Pupils are equal, round, and reactive to light.  ?Cardiovascular:  ?   Rate and Rhythm: Normal rate and regular rhythm.  ?   Pulses: Normal pulses.  ?   Heart sounds: Normal heart sounds.  ?Pulmonary:  ?   Effort: Pulmonary effort is normal.  ?   Breath sounds: Normal breath sounds.  ?Abdominal:  ?   General: Abdomen is flat. Bowel sounds are normal.  ?   Palpations: Abdomen is soft.  ?Musculoskeletal:  ?   Left wrist: Swelling, deformity and tenderness present. Decreased  range of motion.  ?   Cervical back: Normal range of motion and neck supple.  ?Skin: ?   General: Skin is warm.  ?   Capillary Refill: Capillary refill takes less than 2 seconds.  ?Neurological:  ?   General: No focal deficit present.  ?   Mental Status: She is alert and oriented to person, place, and time.  ?Psychiatric:     ?   Mood and Affect: Mood normal.     ?   Behavior: Behavior normal.  ? ? ?ED Results / Procedures / Treatments   ?Labs ?(all labs ordered are listed, but only abnormal results are displayed) ?Labs Reviewed - No data to display ? ?EKG ?None ? ?Radiology ?DG Forearm Left ? ?Result Date:  01/01/2022 ?CLINICAL DATA:  Mechanical fall.  Slipped on ice.  Wrist pain. EXAM: LEFT FOREARM - 2 VIEW; LEFT WRIST - COMPLETE 3+ VIEW COMPARISON:  None. FINDINGS: Nondisplaced distal radial fracture is present. No significant angulation is present. Fracture extends to the articular surface. Carpal bones are intact. Proximal radius and ulna are within normal limits. Elbow is unremarkable. IMPRESSION: 1. Nondisplaced intra-articular distal radial fracture without significant angulation. 2. No other acute abnormality. Electronically Signed   By: San Morelle M.D.   On: 01/01/2022 12:20  ? ?DG Wrist Complete Left ? ?Result Date: 01/01/2022 ?CLINICAL DATA:  Mechanical fall.  Slipped on ice.  Wrist pain. EXAM: LEFT FOREARM - 2 VIEW; LEFT WRIST - COMPLETE 3+ VIEW COMPARISON:  None. FINDINGS: Nondisplaced distal radial fracture is present. No significant angulation is present. Fracture extends to the articular surface. Carpal bones are intact. Proximal radius and ulna are within normal limits. Elbow is unremarkable. IMPRESSION: 1. Nondisplaced intra-articular distal radial fracture without significant angulation. 2. No other acute abnormality. Electronically Signed   By: San Morelle M.D.   On: 01/01/2022 12:20   ? ?Procedures ?[facility]Orth[facility] Injury Treatment ? ?Date/Time: 01/01/2022 1:32 PM ?Performed by: Isla Pence, MD ?Authorized by: Isla Pence, MD  ? ?Consent:  ?  Consent obtained:  Verbal ?  Consent given by:  Patient ?  Risks discussed:  Fracture ?  Alternatives discussed:  No treatmentInjury location: w

## 2022-01-01 NOTE — Discharge Instructions (Addendum)
Take you normal pain medication as needed for pain.  You can add tylenol and ibuprofen. ?

## 2022-01-04 ENCOUNTER — Ambulatory Visit
Admission: RE | Admit: 2022-01-04 | Discharge: 2022-01-04 | Disposition: A | Discharge status: Home / Self Care | Payer: BC Managed Care – PPO | Source: Ambulatory Visit | Attending: Obstetrics and Gynecology | Admitting: Obstetrics and Gynecology

## 2022-01-04 ENCOUNTER — Other Ambulatory Visit: Payer: Self-pay

## 2022-01-04 ENCOUNTER — Ambulatory Visit
Admission: RE | Admit: 2022-01-04 | Discharge: 2022-01-04 | Disposition: A | Discharge status: Home / Self Care | Payer: BLUE CROSS/BLUE SHIELD | Source: Ambulatory Visit | Attending: Obstetrics and Gynecology | Admitting: Obstetrics and Gynecology

## 2022-01-04 DIAGNOSIS — R928 Other abnormal and inconclusive findings on diagnostic imaging of breast: Secondary | ICD-10-CM

## 2022-01-04 DIAGNOSIS — R922 Inconclusive mammogram: Secondary | ICD-10-CM | POA: Diagnosis not present

## 2022-01-08 ENCOUNTER — Other Ambulatory Visit: Payer: Self-pay | Admitting: Family Medicine

## 2022-01-08 ENCOUNTER — Other Ambulatory Visit: Payer: Self-pay | Admitting: *Deleted

## 2022-01-08 MED ORDER — BREZTRI AEROSPHERE 160-9-4.8 MCG/ACT IN AERO: 2.0000 | INHALATION_SPRAY | Freq: Two times a day (BID) | RESPIRATORY_TRACT | 5 refills | Status: DC

## 2022-01-08 NOTE — Telephone Encounter (Signed)
Patient called stating she doesn't have refills on the Breztri and Xopenex Inhalers. According to our system we sent in [facility] (12/22) with 5 refills.  ? ?[facility] on [address]  ?

## 2022-01-08 NOTE — Telephone Encounter (Signed)
Refills have been sent in to requested pharmacy. Called patient and informed. Patient verbalized understanding.  ?

## 2022-01-14 ENCOUNTER — Institutional Professional Consult (permissible substitution): Payer: BLUE CROSS/BLUE SHIELD | Admitting: Adult Health

## 2022-01-16 DIAGNOSIS — S52532A Colles' fracture of left radius, initial encounter for closed fracture: Secondary | ICD-10-CM | POA: Diagnosis not present

## 2022-01-18 ENCOUNTER — Other Ambulatory Visit: Payer: Self-pay | Admitting: *Deleted

## 2022-01-18 ENCOUNTER — Telehealth: Payer: Self-pay | Admitting: Allergy & Immunology

## 2022-01-18 MED ORDER — TRELEGY ELLIPTA 200-62.5-25 MCG/ACT IN AEPB: 1.0000 | INHALATION_SPRAY | Freq: Every day | RESPIRATORY_TRACT | 5 refills | Status: DC

## 2022-01-18 NOTE — Telephone Encounter (Signed)
Called and spoke with the pharmacy and they stated that they needed to order the inhaler and could have it there on Monday. They did stated that there has been a change with her insurance and it now will be roughly a $240 cost for the [facility]. I called and spoke with the patient and offered to place a sample of Breztri up front for her to pick up to hold her over but she stated that she did not have a way because she has a broken arm. She recommended could she go back to the Symbicort if it would be cheaper. Would you like to try symbicort or other alternatives and I can try to look at coverage cost?  ?

## 2022-01-18 NOTE — Telephone Encounter (Signed)
Can you please try to order Trelegy and see if that's covered? 1 puff once a day please. Thank you

## 2022-01-18 NOTE — Telephone Encounter (Signed)
200 please

## 2022-01-18 NOTE — Telephone Encounter (Signed)
Prescription has been sent in. Called patient and advised and asked to call back with any issues. Patient verbalized understanding.  ?

## 2022-01-18 NOTE — Telephone Encounter (Signed)
Which strength Trelegy?  ?

## 2022-01-18 NOTE — Telephone Encounter (Signed)
Patient called and stated her pharmacy does not have her prescription for [facility]. Patient stated that since she can not get it can she have a prescription for her previous medication Symbicort. Patient stated that she has been without medication because of this. Patient's pharmacy is [facility] on [address]. Pharmacy closes at 6pm today and is closed this weekend. Patient requesting a call back at [phone]. ?

## 2022-01-22 DIAGNOSIS — F339 Major depressive disorder, recurrent, unspecified: Secondary | ICD-10-CM | POA: Diagnosis not present

## 2022-01-22 DIAGNOSIS — F909 Attention-deficit hyperactivity disorder, unspecified type: Secondary | ICD-10-CM | POA: Diagnosis not present

## 2022-01-22 DIAGNOSIS — F410 Panic disorder [episodic paroxysmal anxiety] without agoraphobia: Secondary | ICD-10-CM | POA: Diagnosis not present

## 2022-01-22 DIAGNOSIS — F4010 Social phobia, unspecified: Secondary | ICD-10-CM | POA: Diagnosis not present

## 2022-02-04 DIAGNOSIS — F411 Generalized anxiety disorder: Secondary | ICD-10-CM | POA: Diagnosis not present

## 2022-02-04 DIAGNOSIS — F339 Major depressive disorder, recurrent, unspecified: Secondary | ICD-10-CM | POA: Diagnosis not present

## 2022-02-04 DIAGNOSIS — F4010 Social phobia, unspecified: Secondary | ICD-10-CM | POA: Diagnosis not present

## 2022-02-04 DIAGNOSIS — F410 Panic disorder [episodic paroxysmal anxiety] without agoraphobia: Secondary | ICD-10-CM | POA: Diagnosis not present

## 2022-02-08 ENCOUNTER — Ambulatory Visit: Payer: BC Managed Care – PPO | Admitting: Family Medicine

## 2022-02-08 NOTE — Patient Instructions (Incomplete)
Asthma ?Continue montelukast 10 mg once a day to prevent cough or wheeze ?Continue Trelegy 200-1 puff once a day with a spacer to prevent cough or wheeze. Rinse your mouth and spit after each use. Call the clinic if you experience thrush.  This will replace Symbicort ?Continue levalbuterol 2 puffs every 4 hours as needed for cough or wheeze OR Instead use albuterol 0.083% solution via nebulizer one unit vial every 4 hours as needed for cough or wheeze ?You may use levalbuterol 2 puffs 5-15 minutes before activity to decrease cough or wheeze ? ?Chronic rhinitis ?Continue cetirizine 10 mg once a day as needed for runny nose or itch ?Continue Flonase 2 sprays in each nostril once a day as needed for stuffy nose.  In the right nostril, point the applicator out toward the right ear. In the left nostril, point the applicator out toward the left ear ?Begin saline nasal rinses as needed for nasal symptoms. Use this before any medicated nasal sprays for best result ?Consider updating your environmental allergy testing.  Remember to stop antihistamines 3 days before your testing appointment ? ?Reflux ?Continue omeprazole 40 mg once a day to prevent reflux.  Take this medication 30 minutes before your first meal of the day.  This will replace his omeprazole ?Continue dietary and lifestyle modifications as listed below ? ?Tobacco use ?Try to cut down and quit smoking ?Call the clinic if this treatment plan is not working well for you ? ?Follow up in the clinic in 3 months or sooner if needed. ? ?Lifestyle Changes for Controlling GERD ?When you have GERD, stomach acid feels as if it?s backing up toward your mouth. ?Whether or not you take medication to control your GERD, your symptoms can often be ?improved with lifestyle changes.  ? ?Raise Your Head ?Reflux is more likely to strike when you?re lying down flat, because stomach fluid can ?flow backward more easily. Raising the head of your bed 4-6 inches can help. To do  this: ?Slide blocks or books under the legs at the head of your bed. Or, place a wedge under ?the mattress. Many foam stores can make a suitable wedge for you. The wedge ?should run from your waist to the top of your head. ?Don?t just prop your head on several pillows. This increases pressure on your ?stomach. It can make GERD worse. ? ?Watch Your Eating Habits ?Certain foods may increase the acid in your stomach or relax the lower esophageal ?sphincter, making GERD more likely. It?s best to avoid the following: ?Coffee, tea, and carbonated drinks (with and without caffeine) ?Fatty, fried, or spicy food ?Mint, chocolate, onions, and tomatoes ?Any other foods that seem to irritate your stomach or cause you pain ? ?Relieve the Pressure ?Eat smaller meals, even if you have to eat more often. ?Don?t lie down right after you eat. Wait a few hours for your stomach to empty. ?Avoid tight belts and tight-fitting clothes. ?Lose excess weight. ? ?Tobacco and Alcohol ?Avoid smoking tobacco and drinking alcohol. They can make GERD symptoms worse. ? ?

## 2022-02-08 NOTE — Progress Notes (Deleted)
   [place]  [place] [address] Dept: [phone]  FOLLOW UP NOTE  Patient ID: Sarah Reeves, female    DOB: May 09, 1961  Age: 61 y.o. MRN: [id number] Date of Office Visit: 02/08/2022  Assessment  Chief Complaint: No chief complaint on file.  HPI Sarah Reeves is a 61 year old female who presents to the clinic for follow-up visit.  She was last seen in this clinic on 10/18/2021 by Gareth Morgan, FNP, for evaluation of asthma, chronic rhinitis, reflux, and tobacco abuse.   Drug Allergies:  Allergies  Allergen Reactions   Oysters [Shellfish Allergy] Other (See Comments)    Unknown reaction; "MD told me I was allergic"   Zoloft [Sertraline Hcl] Other (See Comments)    REACTION: Nightmares, Grind teeth really bad   Latex Itching    Itch and blisters from contact   Amitriptyline     Hot, itching, tongue tingling, breakthrough bleeding.   Clindamycin Other (See Comments)   Clindamycin/Lincomycin Hives and Other (See Comments)    Stated that this causes her to get really horse and made her feel really hot   Other Other (See Comments)   Penicillins    Pentosan Polysulfate Sodium Swelling    Throat swelling   Sulfa Antibiotics Other (See Comments)   Topamax [Topiramate]     Says it makes her "constipation and she can't urinate normally"   Uribel [Meth-Hyo-M Bl-Na Phos-Ph Sal] Nausea Only   Uribel [Urelle] Swelling and Other (See Comments)    Patient stated this makes her stomach swell and makes her feel si   Lamotrigine Rash   Sulfamethoxazole-Trimethoprim Rash    Patient developed feelings of dizziness, rash and itchy hands.  Patient then vomited and had diarrhea.    Physical Exam: There were no vitals taken for this visit.   Physical Exam  Diagnostics:    Assessment and Plan: No diagnosis found.  No orders of the defined types were placed in this encounter.   There are no Patient Instructions on file for this visit.  No follow-ups on file.     Thank you for the opportunity to care for this patient.  Please do not hesitate to contact me with questions.  Gareth Morgan, FNP Allergy and [facility] [place] [facility] [employer]

## 2022-02-13 DIAGNOSIS — S52532D Colles' fracture of left radius, subsequent encounter for closed fracture with routine healing: Secondary | ICD-10-CM | POA: Diagnosis not present

## 2022-03-01 ENCOUNTER — Other Ambulatory Visit (HOSPITAL_COMMUNITY): Payer: Self-pay | Admitting: Obstetrics and Gynecology

## 2022-03-01 ENCOUNTER — Ambulatory Visit (HOSPITAL_COMMUNITY)
Admission: RE | Admit: 2022-03-01 | Discharge: 2022-03-01 | Disposition: A | Discharge status: Home / Self Care | Payer: BC Managed Care – PPO | Source: Ambulatory Visit | Attending: Obstetrics and Gynecology | Admitting: Obstetrics and Gynecology

## 2022-03-01 DIAGNOSIS — S2231XD Fracture of one rib, right side, subsequent encounter for fracture with routine healing: Secondary | ICD-10-CM | POA: Diagnosis not present

## 2022-03-01 DIAGNOSIS — J9811 Atelectasis: Secondary | ICD-10-CM | POA: Diagnosis not present

## 2022-03-01 DIAGNOSIS — R309 Painful micturition, unspecified: Secondary | ICD-10-CM | POA: Diagnosis not present

## 2022-03-01 DIAGNOSIS — R0781 Pleurodynia: Secondary | ICD-10-CM | POA: Diagnosis not present

## 2022-03-15 DIAGNOSIS — F32A Depression, unspecified: Secondary | ICD-10-CM | POA: Diagnosis not present

## 2022-03-15 DIAGNOSIS — I10 Essential (primary) hypertension: Secondary | ICD-10-CM | POA: Diagnosis not present

## 2022-03-15 DIAGNOSIS — N390 Urinary tract infection, site not specified: Secondary | ICD-10-CM | POA: Diagnosis not present

## 2022-03-15 DIAGNOSIS — R0789 Other chest pain: Secondary | ICD-10-CM | POA: Diagnosis not present

## 2022-03-19 ENCOUNTER — Other Ambulatory Visit: Payer: Self-pay | Admitting: Obstetrics and Gynecology

## 2022-03-19 DIAGNOSIS — R0789 Other chest pain: Secondary | ICD-10-CM

## 2022-03-20 DIAGNOSIS — S52532D Colles' fracture of left radius, subsequent encounter for closed fracture with routine healing: Secondary | ICD-10-CM | POA: Diagnosis not present

## 2022-03-26 NOTE — Patient Instructions (Incomplete)
Asthma Continue montelukast 10 mg once a day to prevent cough or wheeze Continue Breztri-2 puffs twice a day with a spacer to prevent cough or wheeze. Rinse your mouth and spit after each use. Call the clinic if you experience thrush.  This will replace Symbicort Continue levalbuterol 2 puffs every 4 hours as needed for cough or wheeze OR Instead use albuterol 0.083% solution via nebulizer one unit vial every 4 hours as needed for cough or wheeze You may use levalbuterol 2 puffs 5-15 minutes before activity to decrease cough or wheeze  Thrush Begin nystatin swish and swallow 5 mL once every 4 hours for 7 days Use a spacer with your Breztri inhaler and rinse your mouth after each use of Breztri  Chronic rhinitis Continue cetirizine 10 mg once a day as needed for runny nose or itch Continue Flonase 2 sprays in each nostril once a day as needed for stuffy nose.  In the right nostril, point the applicator out toward the right ear. In the left nostril, point the applicator out toward the left ear Begin saline nasal rinses as needed for nasal symptoms. Use this before any medicated nasal sprays for best result  Reflux Continue omeprazole 40 mg once a day to prevent reflux.  Take this medication 30 minutes before your first meal of the day.  This will replace his omeprazole Continue dietary and lifestyle modifications as listed below  Tobacco use Try to cut down and quit smoking Call the clinic if this treatment plan is not working well for you  Follow up in the clinic in 3 months or sooner if needed.  Lifestyle Changes for Controlling GERD When you have GERD, stomach acid feels as if it's backing up toward your mouth. Whether or not you take medication to control your GERD, your symptoms can often be improved with lifestyle changes.   Raise Your Head Reflux is more likely to strike when you're lying down flat, because stomach fluid can flow backward more easily. Raising the head of your bed  4-6 inches can help. To do this: Slide blocks or books under the legs at the head of your bed. Or, place a wedge under the mattress. Many foam stores can make a suitable wedge for you. The wedge should run from your waist to the top of your head. Don't just prop your head on several pillows. This increases pressure on your stomach. It can make GERD worse.  Watch Your Eating Habits Certain foods may increase the acid in your stomach or relax the lower esophageal sphincter, making GERD more likely. It's best to avoid the following: Coffee, tea, and carbonated drinks (with and without caffeine) Fatty, fried, or spicy food Mint, chocolate, onions, and tomatoes Any other foods that seem to irritate your stomach or cause you pain  Relieve the Pressure Eat smaller meals, even if you have to eat more often. Don't lie down right after you eat. Wait a few hours for your stomach to empty. Avoid tight belts and tight-fitting clothes. Lose excess weight.  Tobacco and Alcohol Avoid smoking tobacco and drinking alcohol. They can make GERD symptoms worse.

## 2022-03-26 NOTE — Progress Notes (Deleted)
   [place] [address] Dept: [phone]  FOLLOW UP NOTE  Patient ID: Sarah Reeves, female    DOB: [birth year]  Age: 61 y.o. MRN: [id number] Date of Office Visit: 03/27/2022  Assessment  Chief Complaint: No chief complaint on file.  HPI Sarah Reeves is a 61 year old female who presents to the clinic for follow-up visit.  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  Her last environmental allergy skin testing was on 08/11/2018 and was negative to the adult environmental panel including intradermal testing.   Drug Allergies:  Allergies  Allergen Reactions   Oysters [Shellfish Allergy] Other (See Comments)    Unknown reaction; "MD told me I was allergic"   Zoloft [Sertraline Hcl] Other (See Comments)    REACTION: Nightmares, Grind teeth really bad   Latex Itching    Itch and blisters from contact   Amitriptyline     Hot, itching, tongue tingling, breakthrough bleeding.   Clindamycin Other (See Comments)   Clindamycin/Lincomycin Hives and Other (See Comments)    Stated that this causes her to get really horse and made her feel really hot   Other Other (See Comments)   Penicillins    Pentosan Polysulfate Sodium Swelling    Throat swelling   Sulfa Antibiotics Other (See Comments)   Topamax [Topiramate]     Says it makes her "constipation and she can't urinate normally"   Uribel [Meth-Hyo-M Bl-Na Phos-Ph Sal] Nausea Only   Uribel [Urelle] Swelling and Other (See Comments)    Patient stated this makes her stomach swell and makes her feel si   Lamotrigine Rash   Sulfamethoxazole-Trimethoprim Rash    Patient developed feelings of dizziness, rash and itchy hands.  Patient then vomited and had diarrhea.    Physical Exam: There were no vitals taken for this visit.   Physical Exam  Diagnostics:    Assessment and Plan: No diagnosis found.  No  orders of the defined types were placed in this encounter.   There are no Patient Instructions on file for this visit.  No follow-ups on file.    Thank you for the opportunity to care for this patient.  Please do not hesitate to contact me with questions.  Gareth Morgan, FNP Allergy and [place] [facility] [place]

## 2022-03-27 ENCOUNTER — Ambulatory Visit: Payer: BC Managed Care – PPO | Admitting: Family Medicine

## 2022-03-27 DIAGNOSIS — J309 Allergic rhinitis, unspecified: Secondary | ICD-10-CM

## 2022-04-05 ENCOUNTER — Inpatient Hospital Stay: Admission: RE | Admit: 2022-04-05 | Payer: BC Managed Care – PPO | Source: Ambulatory Visit

## 2022-04-08 ENCOUNTER — Other Ambulatory Visit: Payer: Self-pay | Admitting: Family Medicine

## 2022-04-17 DIAGNOSIS — R82998 Other abnormal findings in urine: Secondary | ICD-10-CM | POA: Diagnosis not present

## 2022-04-17 DIAGNOSIS — R300 Dysuria: Secondary | ICD-10-CM | POA: Diagnosis not present

## 2022-04-17 DIAGNOSIS — L659 Nonscarring hair loss, unspecified: Secondary | ICD-10-CM | POA: Diagnosis not present

## 2022-04-19 ENCOUNTER — Ambulatory Visit: Payer: BC Managed Care – PPO | Admitting: Internal Medicine

## 2022-04-19 ENCOUNTER — Encounter: Payer: Self-pay | Admitting: Internal Medicine

## 2022-04-19 VITALS — BP 140/80 | HR 89 | Temp 97.9°F | Resp 16 | Wt 164.6 lb

## 2022-04-19 DIAGNOSIS — K219 Gastro-esophageal reflux disease without esophagitis: Secondary | ICD-10-CM

## 2022-04-19 DIAGNOSIS — J310 Chronic rhinitis: Secondary | ICD-10-CM

## 2022-04-19 DIAGNOSIS — J449 Chronic obstructive pulmonary disease, unspecified: Secondary | ICD-10-CM

## 2022-04-19 DIAGNOSIS — Z720 Tobacco use: Secondary | ICD-10-CM | POA: Diagnosis not present

## 2022-04-19 MED ORDER — CEFDINIR 300 MG PO CAPS: 600.0000 mg | ORAL_CAPSULE | Freq: Every day | ORAL | 0 refills | Status: AC

## 2022-04-19 MED ORDER — PANTOPRAZOLE SODIUM 40 MG PO TBEC: 40.0000 mg | DELAYED_RELEASE_TABLET | Freq: Every day | ORAL | 1 refills | Status: DC

## 2022-04-19 NOTE — Progress Notes (Signed)
Follow Up Note  RE: Sarah Reeves MRN: [id number] DOB: Nov 05, 1960 Date of Office Visit: 04/19/2022  Referring provider: Dian Queen, MD Primary care provider: Dian Queen, MD  Chief Complaint: Asthma (Asthma-COPD overlap), Medication Refill (Albuterol, montelukast, neb solution), Cough (Productive: white foamy thin stuff in the morning), Wheezing (At night during sleep. ), and Other (Sleep study scheduled soon)  History of Present Illness: I had the pleasure of seeing Sarah Reeves for a follow up visit at the [facility] [place] [place] on 04/19/2022. She is a 61 y.o. female, who is being followed for asthma/COPD overlap, chronic rhinitis, reflux. Her previous allergy office visit was on 10/11/2021 with Sarah Reeves, Sarah Reeves. Today is a regular follow up visit.  History obtained from patient. She is currently on Augmentin for a UTI which she started yesterday for 7 days.    ASTHMA - Medical therapy: Breztri 2 puffs once daily, montelukast 10 mg daily - Rescue inhaler use: using a couple of times a day  - Symptoms:  reports increased sputum, cough worse in morning  - Exacerbation history: 0 ABX for respiratory illness since last visit, 0 OCS, 0ED, 0 UC visits in the past year  - ACT: 14 /25 - Adverse effects of medication: denies  - Previous FEV1: 2.52 L, with no significant postbronchodilator response - Biologic Labs  AEC 0 10/2021,   -She continues to smoke 7 cigarettes a day   - She has a sleep study planned for evaluation of sleep apnea   Chronic  Rhinitis: current therapy: Zyrtec 10 mg daily,  symptoms improved symptoms include: nasal congestion and rhinorrhea Previous allergy testing:   08/11/2018: negative to the adult environmental panel including intradermal testing History of reflux/heartburn:  Yes - on nexium at home, not well controlled     Assessment and Plan: Sarah Reeves is a 61 y.o. female with: Asthma-COPD overlap syndrome ([place]) - Plan: Spirometry with  Graph  Chronic rhinitis  Gastroesophageal reflux disease without esophagitis  Tobacco use  Poorly controlled severe persistent asthma with component of COPD and acute exacerbation.  She is a Th2 low endotype and may be a candidate for [place] which we will discuss at next visit.  In the meantime we will focus on reflux management and smoking cessation as these are likely contributing to poor respiratory control  Plan: Patient Instructions  Asthma /COPD with exacerbation  Breathing test looked ok today, but based on exam and symptoms your asthma/copd is not well controlled.  For acute exacerbation:    Start prednisone 20 mg twice a day for 4 days then take 20 mg once a day then stop   A prescription has been placed for azithromycin.  Start this after you finish your augmentin prescription for the bladder infection.  Continue montelukast 10 mg once a day to prevent cough or wheeze Continue Breztri-2 puffs twice a day with a spacer to prevent cough or wheeze. Rinse your mouth and spit after each use. Call the clinic if you experience thrush.   Continue levalbuterol 2 puffs every 4 hours as needed for cough or wheeze OR Instead use albuterol 0.083% solution via nebulizer one unit vial every 4 hours as needed for cough or wheeze You may use levalbuterol 2 puffs 5-15 minutes before activity to decrease cough or wheeze  Chronic rhinitis Continue cetirizine 10 mg once a day as needed for runny nose or itch Continue Flonase 2 sprays in each nostril once a day as needed for stuffy nose.  In  the right nostril, point the applicator out toward the right ear. In the left nostril, point the applicator out toward the left ear Begin saline nasal rinses as needed for nasal symptoms. Use this before any medicated nasal sprays for best result  Reflux Start Protonix '40mg'$  daily, this replaces the nexium  Continue dietary and lifestyle modifications as listed below  Tobacco use Try to cut down and quit  smoking Call the clinic if this treatment plan is not working well for you  Follow up in the clinic in 4 weeks    No follow-ups on file.  Meds ordered this encounter  Medications   pantoprazole (PROTONIX) 40 MG tablet    Sig: Take 1 tablet (40 mg total) by mouth daily.    Dispense:  90 tablet    Refill:  1   cefdinir (OMNICEF) 300 MG capsule    Sig: Take 2 capsules (600 mg total) by mouth daily for 5 days.    Dispense:  10 capsule    Refill:  0    Lab Orders  No laboratory test(s) ordered today   Diagnostics: Spirometry:  Tracings reviewed. Her effort: Good reproducible efforts. FVC: 2.83 L FEV1: 2.08 L, 80% predicted FEV1/FVC ratio: 73% Interpretation: Spirometry consistent with normal pattern.  Please see scanned spirometry results for details.  Results interpreted by myself during this encounter and discussed with patient/family.   Medication List:  Current Outpatient Medications  Medication Sig Dispense Refill   Ascorbic Acid (VITAMIN C) 1000 MG tablet Take 1,000 mg by mouth daily.     aspirin EC 81 MG tablet Take 1 tablet (81 mg total) by mouth daily. 90 tablet 3   BREZTRI AEROSPHERE 160-9-4.8 MCG/ACT AERO Inhale 2 puffs into the lungs in the morning and at bedtime. 10.7 g 5   Budeson-Glycopyrrol-Formoterol (BREZTRI AEROSPHERE) 160-9-4.8 MCG/ACT AERO Inhale 2 puffs into the lungs in the morning and at bedtime. 10.7 g 5   cefdinir (OMNICEF) 300 MG capsule Take 2 capsules (600 mg total) by mouth daily for 5 days. 10 capsule 0   cetirizine (ZYRTEC) 10 MG tablet Take 1 tablet (10 mg total) by mouth daily. 30 tablet 5   cholecalciferol (VITAMIN D) 1000 UNITS tablet Take 2,000 Units by mouth daily.      cyclobenzaprine (FLEXERIL) 10 MG tablet Take 10 mg by mouth 3 (three) times daily as needed for muscle spasms.     DULoxetine (CYMBALTA) 30 MG capsule Take 30 mg by mouth daily.     estradiol (VIVELLE-DOT) 0.075 MG/24HR Place onto the skin.     gabapentin (NEURONTIN) 300  MG capsule Take 300 mg by mouth daily as needed (nerve pain).     HYDROcodone-acetaminophen (NORCO) 10-325 MG tablet Take 1 tablet by mouth every 4 (four) hours as needed.     levalbuterol (XOPENEX HFA) 45 MCG/ACT inhaler INHALE 2 PUFFS INTO THE LUNGS EVERY 4 HOURS AS NEEDED FOR WHEEZING 15 g 0   levalbuterol (XOPENEX) 1.25 MG/3ML nebulizer solution Take 1.25 mg by nebulization every 4 (four) hours as needed for wheezing. 72 mL 5   meclizine (ANTIVERT) 25 MG tablet Take 25 mg by mouth 3 (three) times daily as needed for dizziness.     montelukast (SINGULAIR) 10 MG tablet Take 1 tablet (10 mg total) by mouth at bedtime. 30 tablet 5   pantoprazole (PROTONIX) 40 MG tablet Take 1 tablet (40 mg total) by mouth daily. 90 tablet 1   progesterone (PROMETRIUM) 100 MG capsule Take 100 mg by mouth  at bedtime.     promethazine (PHENERGAN) 25 MG tablet Take 25 mg by mouth every 6 (six) hours as needed for nausea or vomiting.     triamterene-hydrochlorothiazide (DYAZIDE) 37.5-25 MG capsule Take 1 capsule by mouth daily.     valACYclovir (VALTREX) 500 MG tablet Take 500 mg by mouth daily as needed (outbreaks).     zolpidem (AMBIEN) 10 MG tablet Take 10 mg by mouth at bedtime.     chlorpheniramine-HYDROcodone (TUSSIONEX) 10-8 MG/5ML SUER hydrocodone 10 mg-chlorpheniramine 8 mg/5 mL oral susp extend.rel 12hr  Take 5 mL every 12 hours by oral route. (Patient not taking: Reported on 10/18/2021)     Dextromethorphan-guaiFENesin (TUSSIN DM) 10-100 MG/5ML liquid Take 5 mLs by mouth every 12 (twelve) hours. (Patient not taking: Reported on 10/18/2021) 200 mL 0   LORazepam (ATIVAN) 2 MG tablet Take 2 mg by mouth in the morning, at noon, and at bedtime. (Patient not taking: Reported on 04/19/2022)     nystatin (MYCOSTATIN) 100000 UNIT/ML suspension Take 5 mLs (500,000 Units total) by mouth 4 (four) times daily. (Patient not taking: Reported on 04/19/2022) 60 mL 0   omeprazole (PRILOSEC) 40 MG capsule Take 1 capsule (40 mg  total) by mouth daily. (Patient not taking: Reported on 04/19/2022) 30 capsule 5   TRELEGY ELLIPTA 200-62.5-25 MCG/ACT AEPB Inhale 1 puff into the lungs daily. (Patient not taking: Reported on 04/19/2022) 28 each 5   ziprasidone (GEODON) 40 MG capsule Take 40 mg by mouth daily. (Patient not taking: Reported on 04/19/2022)     No current facility-administered medications for this visit.   Facility-Administered Medications Ordered in Other Visits  Medication Dose Route Frequency Provider Last Rate Last Admin   chlorhexidine (HIBICLENS) 4 % liquid 1 application  1 application  Topical Once Autumn Messing III, MD       chlorhexidine (HIBICLENS) 4 % liquid 1 application  1 application  Topical Once Jovita Kussmaul, MD       Allergies: Allergies  Allergen Reactions   Oysters [Shellfish Allergy] Other (See Comments)    Unknown reaction; "MD told me I was allergic"   Zoloft [Sertraline Hcl] Other (See Comments)    REACTION: Nightmares, Grind teeth really bad   Latex Itching    Itch and blisters from contact   Amitriptyline     Hot, itching, tongue tingling, breakthrough bleeding.   Clindamycin Other (See Comments)   Clindamycin/Lincomycin Hives and Other (See Comments)    Stated that this causes her to get really horse and made her feel really hot   Other Other (See Comments)   Penicillins    Pentosan Polysulfate Sodium Swelling    Throat swelling   Sulfa Antibiotics Other (See Comments)   Topamax [Topiramate]     Says it makes her "constipation and she can't urinate normally"   Uribel [Meth-Hyo-M Bl-Na Phos-Ph Sal] Nausea Only   Uribel [Urelle] Swelling and Other (See Comments)    Patient stated this makes her stomach swell and makes her feel si   Lamotrigine Rash   Sulfamethoxazole-Trimethoprim Rash    Patient developed feelings of dizziness, rash and itchy hands.  Patient then vomited and had diarrhea.   I reviewed her past medical history, social history, family history, and environmental  history and no significant changes have been reported from her previous visit.  ROS: All others negative except as noted per HPI.   Objective: BP 140/80   Pulse 89   Temp 97.9 F (36.6 C) (Temporal)   Resp  16   Wt 164 lb 9.6 oz (74.7 kg)   SpO2 97%   BMI 28.25 kg/m  Body mass index is 28.25 kg/m. General Appearance:  Alert, cooperative, no distress, appears stated age  Head:  Normocephalic, without obvious abnormality, atraumatic  Eyes:  Conjunctiva clear, EOM's intact  Nose: Nares normal,  erythematous nasal mucosa, hypertrophic turbinates, no visible anterior polyps, and septum midline  Throat: Lips, tongue normal; teeth and gums normal, + cobblestoning  Neck: Supple, symmetrical  Lungs:   wheezing throughout, Respirations unlabored, intermittent dry coughing  Heart:  regular rate and rhythm and no murmur, Appears well perfused  Extremities: No edema  Skin: Skin color, texture, turgor normal, no rashes or lesions on visualized portions of skin  Neurologic: No gross deficits   Previous notes and tests were reviewed. The plan was reviewed with the patient/family, and all questions/concerned were addressed.  It was my pleasure to see Delanie today and participate in her care. Please feel free to contact me with any questions or concerns.  Sincerely,  Roney Marion, MD  Allergy & Immunology  [facility] Office: [phone]

## 2022-04-19 NOTE — Patient Instructions (Addendum)
Asthma /COPD with exacerbation  Breathing test looked ok today, but based on exam and symptoms your asthma/copd is not well controlled.  For acute exacerbation:    Start prednisone 20 mg twice a day for 4 days then take 20 mg once a day then stop   A prescription has been placed for azithromycin.  Start this after you finish your augmentin prescription for the bladder infection.  Continue montelukast 10 mg once a day to prevent cough or wheeze Continue Breztri-2 puffs twice a day with a spacer to prevent cough or wheeze. Rinse your mouth and spit after each use. Call the clinic if you experience thrush.   Continue levalbuterol 2 puffs every 4 hours as needed for cough or wheeze OR Instead use albuterol 0.083% solution via nebulizer one unit vial every 4 hours as needed for cough or wheeze You may use levalbuterol 2 puffs 5-15 minutes before activity to decrease cough or wheeze  Chronic rhinitis Continue cetirizine 10 mg once a day as needed for runny nose or itch Continue Flonase 2 sprays in each nostril once a day as needed for stuffy nose.  In the right nostril, point the applicator out toward the right ear. In the left nostril, point the applicator out toward the left ear Begin saline nasal rinses as needed for nasal symptoms. Use this before any medicated nasal sprays for best result  Reflux Start Protonix '40mg'$  daily, this replaces the nexium  Continue dietary and lifestyle modifications as listed below  Tobacco use Try to cut down and quit smoking Call the clinic if this treatment plan is not working well for you  Follow up in the clinic in 4 weeks

## 2022-05-01 DIAGNOSIS — S52532D Colles' fracture of left radius, subsequent encounter for closed fracture with routine healing: Secondary | ICD-10-CM | POA: Diagnosis not present

## 2022-05-02 ENCOUNTER — Telehealth: Payer: Self-pay

## 2022-05-02 NOTE — Telephone Encounter (Signed)
PA for Pantoprazole Sodium 40 mg DR tablets APPROVED:  KEY: [id number]    Approvedtoday Effective from 05/02/2022 through 05/01/2023.

## 2022-05-03 ENCOUNTER — Telehealth: Payer: Self-pay | Admitting: Internal Medicine

## 2022-05-03 MED ORDER — NYSTATIN 100000 UNIT/ML MT SUSP: OROMUCOSAL | 0 refills | Status: DC

## 2022-05-03 NOTE — Telephone Encounter (Signed)
Sent Rx and patient have been notified.

## 2022-05-03 NOTE — Addendum Note (Signed)
Addended by: Jacqualin Combes on: 05/03/2022 11:55 AM   Modules accepted: Orders

## 2022-05-03 NOTE — Telephone Encounter (Signed)
She can start nystatin swish and swallow.  4 times a day for 7 days.  Thanks!

## 2022-05-03 NOTE — Telephone Encounter (Signed)
Patient called and stated she is having an issue with thrush. She stated she thinks it is from her inhaler but she's not sure. Patient is requesting a refill on the liquid thrush medication that she has gotten in the past. She was not sure what it is called. Patients is requesting a call back at [phone]

## 2022-05-06 NOTE — Telephone Encounter (Signed)
Sarah Reeves called in and states that she needs another nystatin called in.  Sarah Reeves states she had it on the counter to use it and she accidentally knocked it over and it fell and splashed all over the place and the bottle is empty.  She would like a new one called in as she still has thrush.  She would like it called in to   [facility] [place] [facility] [address] AT [address], [place] [phone]

## 2022-05-06 NOTE — Telephone Encounter (Signed)
Called the patient to see how many days she was able to take it before spilling her current prescription. The patient took it for 2 in a half days and is still having issues with thrush no other symptoms. Please advise on refilling the prescription.

## 2022-05-07 MED ORDER — NYSTATIN 100000 UNIT/ML MT SUSP: OROMUCOSAL | 0 refills | Status: DC

## 2022-05-07 NOTE — Telephone Encounter (Signed)
Patient called back requesting this medication be sent in ASAP.

## 2022-05-07 NOTE — Telephone Encounter (Signed)
We can give her enough for an additional 5 days . Thanks!

## 2022-05-07 NOTE — Telephone Encounter (Signed)
Rx sent to pharmacy   

## 2022-05-07 NOTE — Addendum Note (Signed)
Addended by: Wadie Lessen on: 05/07/2022 12:14 PM   Modules accepted: Orders

## 2022-05-17 ENCOUNTER — Ambulatory Visit: Payer: BC Managed Care – PPO | Admitting: Internal Medicine

## 2022-05-18 DIAGNOSIS — M791 Myalgia, unspecified site: Secondary | ICD-10-CM | POA: Diagnosis not present

## 2022-05-18 DIAGNOSIS — W57XXXA Bitten or stung by nonvenomous insect and other nonvenomous arthropods, initial encounter: Secondary | ICD-10-CM | POA: Diagnosis not present

## 2022-05-20 ENCOUNTER — Other Ambulatory Visit: Payer: Self-pay | Admitting: Family Medicine

## 2022-05-20 ENCOUNTER — Telehealth: Payer: Self-pay

## 2022-05-20 NOTE — Telephone Encounter (Signed)
Spoke with patient and advised. Explained to restart Trelegy. Advised her there should be plenty of refills at pharmacy since she has not been picking this up, she will contact pharmacy and let us know if any issues with this. Refill on Xopenex HFA sent to pharmacy. Patient also advised she needs OV this week or next, call transferred to front desk for scheduling.

## 2022-05-20 NOTE — Telephone Encounter (Signed)
Patient called back in - DOB verified - regarding medication refill on Trelegy. Patient was advised of previous notations.   Patient stated she thinks she was bitten by a tick about five days ago - just really noticed it this past /Saturday - seen at [facility] on Saturday, 05/11/22. Patient stated blood work was done - [employer] Spotted fever and Lime disease - Rx Doxycycline 100 mg 1 tablet orally 2 times daily for 14 days. Patient stated her symptoms: fatigue and just not feeling good with being on strong antibiotics. Suggested soon office visit - Patient declined stated she has an appt tomorrow, 05/21/22 w/pulmonary and she didn't want to miss the call from [facility] regarding her blood work, stated her cell phone was broke - she was just calling back to see if the Trelegy was refilled.   Patient was advised to make sure she keeps 06/07/22 @ 11:30 am appt w/Dr. Rema Jasmine for future medication refills.  Patient verbalized understanding, no further questions.

## 2022-05-20 NOTE — Telephone Encounter (Signed)
Patient called to state she talked to someone over the weekend and they advised they would send in her rescue inhaler. She states the pharmacy does not have anything yet.   Per chart she had Xopenex sent in 04/02/22 and is now out. She states she has been using this daily to help with cough/wheeze. She is also using Breztri but not Trelegy. She did not think she needed to continue the Trelegy, she thought that was only for when she was recently sick.  She reports current cough, wheeze, shob; denies fever, chest pain/tightness. She states she was bitten by a tick some time ago and is waiting on results from Lyme disease testing. She has appt scheduled for 06/07/22, advised she may need to come in sooner based on current symptoms.  Please advise on Xopenex refill and possible need for sooner OV, thank you!

## 2022-05-20 NOTE — Telephone Encounter (Signed)
We can renew her xopenex, but she should restart her trelegy now.  She needs to come in to be seen sooner as well.  Thanks!

## 2022-05-20 NOTE — Telephone Encounter (Signed)
Patient is wanting to keep her appointment 06-07-2022. I offered her the [facility] location as Dr. Edison Pace has multiple openings that are sooner. Patient declined and wishes to keep current appointment.

## 2022-05-22 ENCOUNTER — Institutional Professional Consult (permissible substitution): Payer: BC Managed Care – PPO | Admitting: Pulmonary Disease

## 2022-05-27 IMAGING — DX DG CHEST 1V PORT
1 series · 1 of 1 positions shown · non-contrast
Comparison: 11/18/2019

CLINICAL DATA: Shortness of breath

EXAM:
PORTABLE CHEST 1 VIEW

[chest ap]
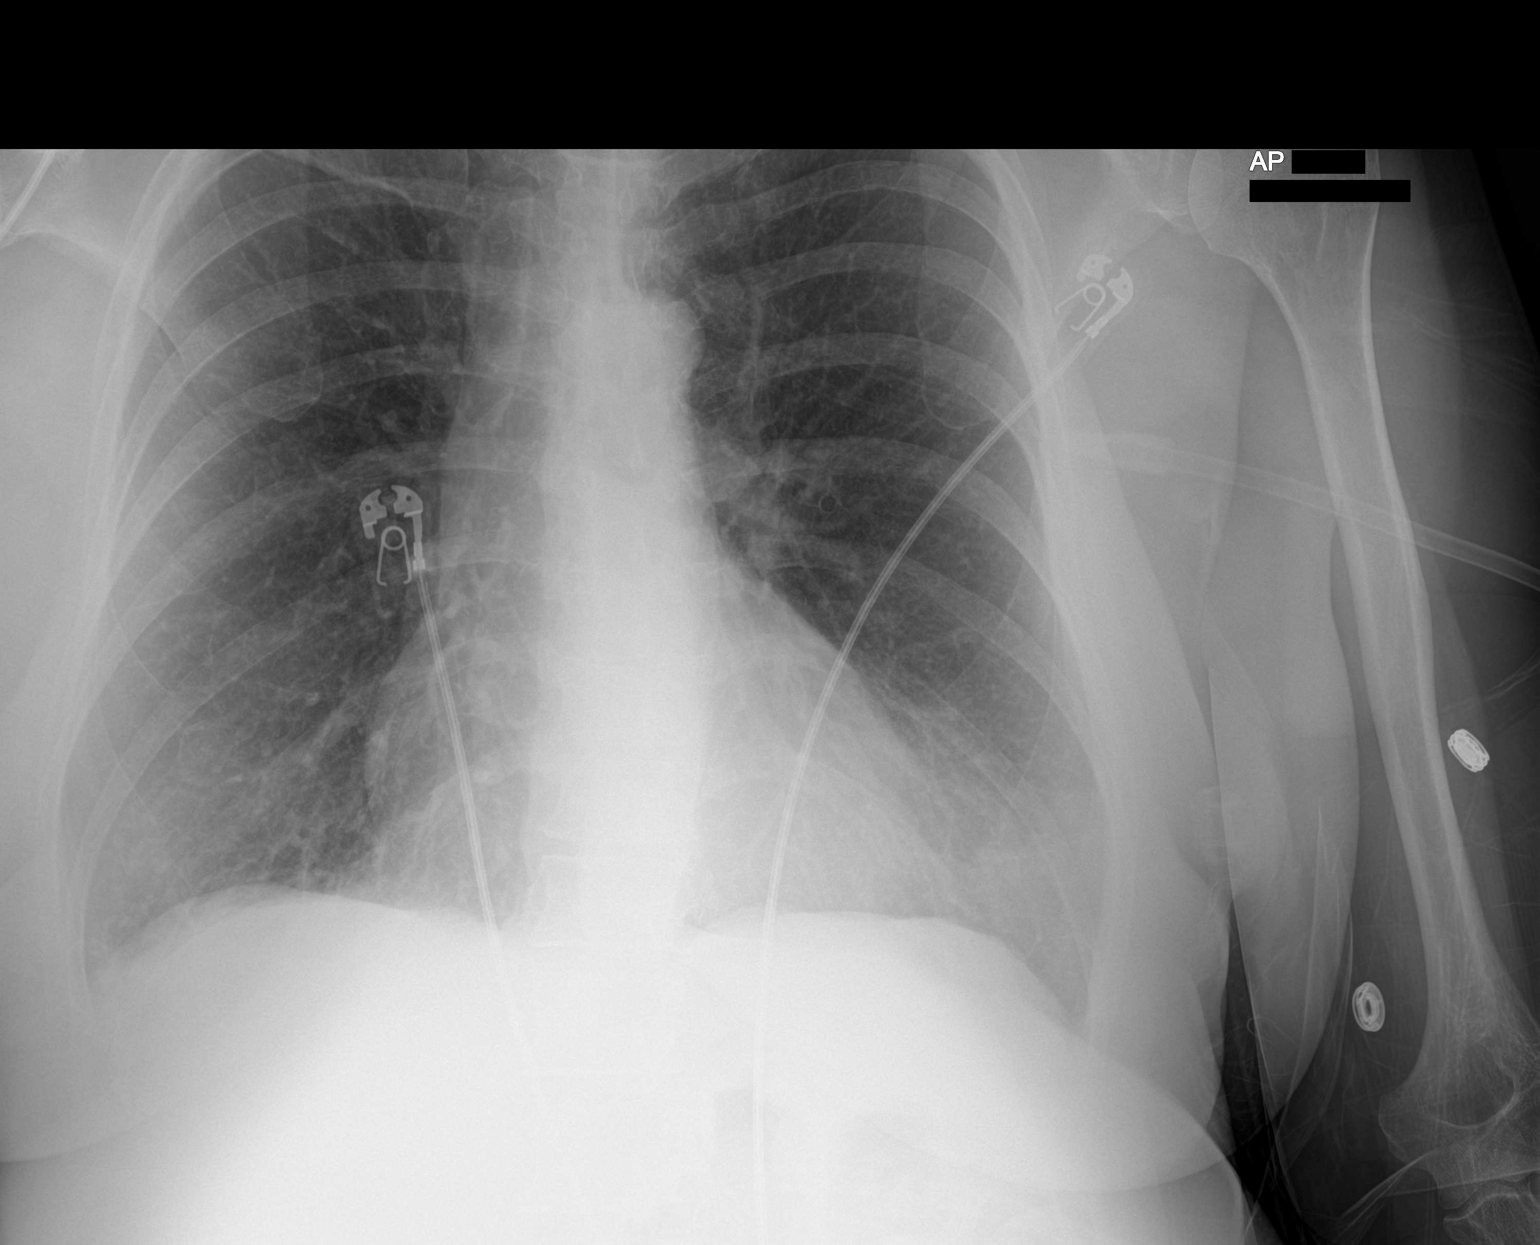

[1 of 1 positions shown; findings below may reference images not displayed]

FINDINGS: The heart size and mediastinal contours are within normal limits.
Both lungs are clear. The visualized skeletal structures are
unremarkable.
IMPRESSION: No active disease.

## 2022-06-07 ENCOUNTER — Ambulatory Visit: Payer: BC Managed Care – PPO | Admitting: Internal Medicine

## 2022-06-07 ENCOUNTER — Encounter: Payer: Self-pay | Admitting: Internal Medicine

## 2022-06-07 VITALS — BP 134/86 | HR 90 | Temp 98.7°F | Resp 18

## 2022-06-07 DIAGNOSIS — K219 Gastro-esophageal reflux disease without esophagitis: Secondary | ICD-10-CM

## 2022-06-07 DIAGNOSIS — J4550 Severe persistent asthma, uncomplicated: Secondary | ICD-10-CM

## 2022-06-07 DIAGNOSIS — J410 Simple chronic bronchitis: Secondary | ICD-10-CM

## 2022-06-07 DIAGNOSIS — J310 Chronic rhinitis: Secondary | ICD-10-CM | POA: Diagnosis not present

## 2022-06-07 DIAGNOSIS — B370 Candidal stomatitis: Secondary | ICD-10-CM | POA: Diagnosis not present

## 2022-06-07 DIAGNOSIS — Z720 Tobacco use: Secondary | ICD-10-CM

## 2022-06-07 NOTE — Progress Notes (Signed)
Follow Up Note  RE: Sarah Reeves MRN: [id number] DOB: 29-Aug-1961 Date of Office Visit: 06/07/2022  Referring provider: Dian Queen, MD Primary care provider: Dian Queen, MD  Chief Complaint: Follow-up  History of Present Illness: I had the pleasure of seeing Sarah Reeves for a follow up visit at the [facility] on 06/07/2022. She is a 61 y.o. female, who is being followed for asthma COPD overlap, chronic rhinitis. Her previous allergy office visit was on 04/19/2022 with Dr. Edison Pace. Today is a regular follow up visit.  History obtained from patient, chart review.  ASTHMA - Medical therapy: Breztri 2 puffs twice daily, montelukast 10 mg daily - Rescue inhaler use: 2-3 times a week - Symptoms: Some increased dyspnea - Exacerbation history: 0 ABX for respiratory illness since last visit, 1 OCS, 0ED, 0 UC visits in the past year  - ACT: 18 /25 - Adverse effects of medication: having a thrush flare  - Previous FEV1: 2.08 L, 73% - [facility] AEC 0 10/2021,  -Continues to smoke 7 cigarettes a day also has not followed up with sleep study for evaluation of sleep apnea   Chronic  Rhinitis: current therapy: Zyrtec 10 mg daily, Flonase 2 sprays per nostril daily, saline rinses symptoms improved symptoms include: Well-controlled Previous allergy testing:   08/11/2018: negative to the adult environmental panel including intradermal testing History of reflux/heartburn:  Yes -on Protonix, improved since starting Protonix   Assessment and Plan: Goldia is a 61 y.o. female with: Severe persistent asthma without complication  Simple chronic bronchitis (HCC)  Chronic rhinitis  Gastroesophageal reflux disease without esophagitis  Oral candidiasis  Tobacco use Plan: Patient Instructions  Severe persistent asthma/COPD  -Breathing test with persistent obstructive pattern that was worse from prior -Treatment complicated by recurrent oral candidiasis -At  this point given difficulty to control her symptoms, worsening spirometry adverse effects of medication plan to start Teszpire   - our biologic coordinator will reach out to you on approval process  Continue montelukast 10 mg once a day to prevent cough or wheeze Continue Breztri-2 puffs twice a day with a spacer to prevent cough or wheeze. Rinse your mouth and spit after each use. Call the clinic if you experience thrush.   Continue levalbuterol 2 puffs every 4 hours as needed for cough or wheeze OR Instead use albuterol 0.083% solution via nebulizer one unit vial every 4 hours as needed for cough or wheeze You may use levalbuterol 2 puffs 5-15 minutes before activity to decrease cough or wheeze  Chronic rhinitis: Well-controlled Continue cetirizine 10 mg once a day as needed for runny nose or itch Continue Flonase 2 sprays in each nostril once a day as needed for stuffy nose.  In the right nostril, point the applicator out toward the right ear. In the left nostril, point the applicator out toward the left ear Continue saline nasal rinses as needed for nasal symptoms. Use this before any medicated nasal sprays for best result  Reflux: well controlled  Continue Protonix '40mg'$  daily, this replaces the nexium  Continue dietary and lifestyle modifications as listed below  Tobacco use Try to cut down and quit smoking Call the clinic if this treatment plan is not working well for you  Follow up in the clinic in 4 weeks   No follow-ups on file.  No orders of the defined types were placed in this encounter.   Lab Orders  No laboratory test(s) ordered today   Diagnostics: Spirometry:  Tracings reviewed. Her effort: Good reproducible efforts. FVC: 2.37 L FEV1: 1.64 L, 64% predicted FEV1/FVC ratio: 68% Interpretation: Spirometry consistent with moderate obstructive disease.  Please see scanned spirometry results for details.   Results interpreted by myself during this encounter and  discussed with patient/family.   Medication List:  Current Outpatient Medications  Medication Sig Dispense Refill   Ascorbic Acid (VITAMIN C) 1000 MG tablet Take 1,000 mg by mouth daily.     aspirin EC 81 MG tablet Take 1 tablet (81 mg total) by mouth daily. 90 tablet 3   BREZTRI AEROSPHERE 160-9-4.8 MCG/ACT AERO Inhale 2 puffs into the lungs in the morning and at bedtime. 10.7 g 5   cetirizine (ZYRTEC) 10 MG tablet Take 1 tablet (10 mg total) by mouth daily. 30 tablet 5   chlorpheniramine-HYDROcodone (TUSSIONEX) 10-8 MG/5ML SUER      cholecalciferol (VITAMIN D) 1000 UNITS tablet Take 2,000 Units by mouth daily.      cyclobenzaprine (FLEXERIL) 10 MG tablet Take 10 mg by mouth 3 (three) times daily as needed for muscle spasms.     Dextromethorphan-guaiFENesin (TUSSIN DM) 10-100 MG/5ML liquid Take 5 mLs by mouth every 12 (twelve) hours. 200 mL 0   DULoxetine (CYMBALTA) 30 MG capsule Take 30 mg by mouth daily.     estradiol (VIVELLE-DOT) 0.075 MG/24HR Place onto the skin.     FLUoxetine (PROZAC) 40 MG capsule Take 40 mg by mouth daily.     gabapentin (NEURONTIN) 300 MG capsule Take 300 mg by mouth daily as needed (nerve pain).     hydrochlorothiazide (HYDRODIURIL) 50 MG tablet Take 50 mg by mouth daily.     HYDROcodone-acetaminophen (NORCO) 10-325 MG tablet Take 1 tablet by mouth every 4 (four) hours as needed.     levalbuterol (XOPENEX HFA) 45 MCG/ACT inhaler INHALE 2 PUFFS INTO THE LUNGS EVERY 4 HOURS AS NEEDED FOR WHEEZING 15 g 0   levalbuterol (XOPENEX) 1.25 MG/3ML nebulizer solution Take 1.25 mg by nebulization every 4 (four) hours as needed for wheezing. 72 mL 5   LORazepam (ATIVAN) 2 MG tablet Take 2 mg by mouth in the morning, at noon, and at bedtime.     meclizine (ANTIVERT) 25 MG tablet Take 25 mg by mouth 3 (three) times daily as needed for dizziness.     montelukast (SINGULAIR) 10 MG tablet Take 1 tablet (10 mg total) by mouth at bedtime. 30 tablet 5   nystatin (MYCOSTATIN) 100000  UNIT/ML suspension Take 5 mL swish and swallow by mouth 4 times a day for 5 days 60 mL 0   omeprazole (PRILOSEC) 40 MG capsule Take 1 capsule (40 mg total) by mouth daily. 30 capsule 5   pantoprazole (PROTONIX) 40 MG tablet Take 1 tablet (40 mg total) by mouth daily. 90 tablet 1   progesterone (PROMETRIUM) 100 MG capsule Take 100 mg by mouth at bedtime.     promethazine (PHENERGAN) 25 MG tablet Take 25 mg by mouth every 6 (six) hours as needed for nausea or vomiting.     triamterene-hydrochlorothiazide (DYAZIDE) 37.5-25 MG capsule Take 1 capsule by mouth daily.     valACYclovir (VALTREX) 500 MG tablet Take 500 mg by mouth daily as needed (outbreaks).     ziprasidone (GEODON) 40 MG capsule Take 40 mg by mouth daily.     zolpidem (AMBIEN) 10 MG tablet Take 10 mg by mouth at bedtime.     TRELEGY ELLIPTA 200-62.5-25 MCG/ACT AEPB Inhale 1 puff into the lungs daily. (Patient not taking:  Reported on 06/07/2022) 28 each 5   No current facility-administered medications for this visit.   Facility-Administered Medications Ordered in Other Visits  Medication Dose Route Frequency Provider Last Rate Last Admin   chlorhexidine (HIBICLENS) 4 % liquid 1 application  1 application  Topical Once Autumn Messing III, MD       chlorhexidine (HIBICLENS) 4 % liquid 1 application  1 application  Topical Once Jovita Kussmaul, MD       Allergies: Allergies  Allergen Reactions   Oysters [Shellfish Allergy] Other (See Comments)    Unknown reaction; "MD told me I was allergic"   Zoloft [Sertraline Hcl] Other (See Comments)    REACTION: Nightmares, Grind teeth really bad   Latex Itching    Itch and blisters from contact   Amitriptyline     Hot, itching, tongue tingling, breakthrough bleeding.   Clindamycin Other (See Comments)   Clindamycin/Lincomycin Hives and Other (See Comments)    Stated that this causes her to get really horse and made her feel really hot   Other Other (See Comments)   Penicillins    Pentosan  Polysulfate Sodium Swelling    Throat swelling   Sulfa Antibiotics Other (See Comments)   Topamax [Topiramate]     Says it makes her "constipation and she can't urinate normally"   Uribel [Meth-Hyo-M Bl-Na Phos-Ph Sal] Nausea Only   Uribel [Urelle] Swelling and Other (See Comments)    Patient stated this makes her stomach swell and makes her feel si   Lamotrigine Rash   Sulfamethoxazole-Trimethoprim Rash    Patient developed feelings of dizziness, rash and itchy hands.  Patient then vomited and had diarrhea.   I reviewed her past medical history, social history, family history, and environmental history and no significant changes have been reported from her previous visit.  ROS: All others negative except as noted per HPI.   Objective: BP 134/86   Pulse 90   Temp 98.7 F (37.1 C)   Resp 18   SpO2 95%  There is no height or weight on file to calculate BMI. General Appearance:  Alert, cooperative, no distress, appears stated age  Head:  Normocephalic, without obvious abnormality, atraumatic  Eyes:  Conjunctiva clear, EOM's intact  Nose: Nares normal, hypertrophic turbinates, no visible anterior polyps, and septum midline  Throat: Lips, tongue normal; teeth and gums normal,  white plaques on tongue and back of oropharynx, no tonsillar exudate, and + cobblestoning  Neck: Supple, symmetrical  Lungs:   Prolonged expiratory phase and clear to auscultation bilaterally, Respirations unlabored, no coughing  Heart:  regular rate and rhythm and no murmur, Appears well perfused  Extremities: No edema  Skin: Skin color, texture, turgor normal, no rashes or lesions on visualized portions of skin   Neurologic: No gross deficits   Previous notes and tests were reviewed. The plan was reviewed with the patient/family, and all questions/concerned were addressed.  It was my pleasure to see Sarah Reeves today and participate in her care. Please feel free to contact me with any questions or  concerns.  Sincerely,  Roney Marion, MD  Allergy & Immunology  [facility] [place] [facility] Office: [phone]

## 2022-06-07 NOTE — Patient Instructions (Addendum)
Severe persistent asthma/COPD  -Breathing test with persistent obstructive pattern that was worse from prior -Treatment complicated by recurrent oral candidiasis -At this point given difficulty to control her symptoms, worsening spirometry adverse effects of medication plan to start Teszpire   - our biologic coordinator will reach out to you on approval process  Continue montelukast 10 mg once a day to prevent cough or wheeze Continue Breztri-2 puffs twice a day with a spacer to prevent cough or wheeze. Rinse your mouth and spit after each use. Call the clinic if you experience thrush.   Continue levalbuterol 2 puffs every 4 hours as needed for cough or wheeze OR Instead use albuterol 0.083% solution via nebulizer one unit vial every 4 hours as needed for cough or wheeze You may use levalbuterol 2 puffs 5-15 minutes before activity to decrease cough or wheeze  Chronic rhinitis: Well-controlled Continue cetirizine 10 mg once a day as needed for runny nose or itch Continue Flonase 2 sprays in each nostril once a day as needed for stuffy nose.  In the right nostril, point the applicator out toward the right ear. In the left nostril, point the applicator out toward the left ear Continue saline nasal rinses as needed for nasal symptoms. Use this before any medicated nasal sprays for best result  Reflux: well controlled  Continue Protonix '40mg'$  daily, this replaces the nexium  Continue dietary and lifestyle modifications as listed below  Tobacco use Try to cut down and quit smoking Call the clinic if this treatment plan is not working well for you  Follow up in the clinic in 4 weeks

## 2022-06-18 ENCOUNTER — Telehealth: Payer: Self-pay

## 2022-06-18 NOTE — Telephone Encounter (Signed)
Patient called stating she wants to hold off on doing Teszpire as she has been having a lot of GI issues. She will give [place] an update when she is ready to start.

## 2022-06-19 NOTE — Telephone Encounter (Signed)
Okay thanks for letting me know

## 2022-06-19 NOTE — Telephone Encounter (Signed)
Noted  

## 2022-06-21 DIAGNOSIS — N390 Urinary tract infection, site not specified: Secondary | ICD-10-CM | POA: Diagnosis not present

## 2022-06-21 DIAGNOSIS — R87612 Low grade squamous intraepithelial lesion on cytologic smear of cervix (LGSIL): Secondary | ICD-10-CM | POA: Diagnosis not present

## 2022-07-01 ENCOUNTER — Telehealth: Payer: Self-pay | Admitting: Internal Medicine

## 2022-07-01 NOTE — Telephone Encounter (Signed)
Is she taking both Breztri and Trelegy?  She should only be on one or the other.

## 2022-07-01 NOTE — Telephone Encounter (Signed)
Called and spoke with patient and she stated that she has been having issues urinating for the last 3 weeks. She states that she only passes a little urine at a time. She denies any burning with urination, no fever, body aches, or chills. She states that she does have some lower abdominal pain from having diarrhea when she was on an antibiotic 1.5 months ago following a tick bite. She states that she only drinks 1 bottled water a day but she does drink other things. She was wondering if it could be the [facility] or Trelegy. I did advise to call her PCP as well to ensure it's not something else going on, she states that she does have a doctor visit scheduled with them this coming Wednesday.

## 2022-07-01 NOTE — Telephone Encounter (Signed)
Called and spoke with the patient and she stated that she sometimes does both but mainly Breztri. She was not able to give me a reason why she does both. I did advise to just stick with Breztri for now.

## 2022-07-01 NOTE — Telephone Encounter (Signed)
Patient states she is having difficulty urinating. She believes this may be coming from using Breztri and Trelegy. She has felt very fatigued today and is starting to worry because she's never felt like this before.

## 2022-07-02 NOTE — Telephone Encounter (Signed)
Do you think we should have the patient scheduled to come in and be seen? She seems to be having some confusion with the inhalers, and it may be best to explain everything in person.

## 2022-07-02 NOTE — Telephone Encounter (Signed)
If she needs additional support above the breztri, we need to see her.  We can add additional inhaled corticosteroid like flovent or prednisone if needed.  But yes there could be side effects from using trelegy and breztri.  Thanks!

## 2022-07-03 ENCOUNTER — Encounter: Payer: Self-pay | Admitting: Gastroenterology

## 2022-07-03 DIAGNOSIS — R309 Painful micturition, unspecified: Secondary | ICD-10-CM | POA: Diagnosis not present

## 2022-07-03 DIAGNOSIS — K529 Noninfective gastroenteritis and colitis, unspecified: Secondary | ICD-10-CM | POA: Diagnosis not present

## 2022-07-03 NOTE — Telephone Encounter (Signed)
Called and spoke with patient, she has been scheduled to see Dr. Posey Pronto next Tuesday to discuss asthma medications further and the urinary retention. I did advise that she should only be using the [facility] and not the Trelegy and that it may be causing the urinary retention. Patient verbalized understanding.

## 2022-07-03 NOTE — Telephone Encounter (Signed)
Yes if she is still confused about asthma management, I would like to see her in person.  Have her bring her inhalers to that appointment.  Thanks!

## 2022-07-05 DIAGNOSIS — K529 Noninfective gastroenteritis and colitis, unspecified: Secondary | ICD-10-CM | POA: Diagnosis not present

## 2022-07-09 ENCOUNTER — Ambulatory Visit: Payer: BC Managed Care – PPO | Admitting: Internal Medicine

## 2022-07-18 ENCOUNTER — Ambulatory Visit: Payer: BC Managed Care – PPO | Admitting: Family Medicine

## 2022-07-22 ENCOUNTER — Ambulatory Visit: Payer: BC Managed Care – PPO | Admitting: Family Medicine

## 2022-07-22 NOTE — Progress Notes (Deleted)
   [facility] [address] Dept: [phone]  FOLLOW UP NOTE  Patient ID: Sarah Reeves, female    DOB: [birth year]  Age: 61 y.o. MRN: [id number] Date of Office Visit: 07/22/2022  Assessment  Chief Complaint: No chief complaint on file.  HPI [name] is a 61 year old female who presents to the clinic for follow-up visit.  She was last seen in this clinic on 06/07/2022 by [facility] for evaluation of asthma/COPD, chronic rhinitis, reflux, and tobacco use.   Drug Allergies:  Allergies  Allergen Reactions   Oysters [Shellfish Allergy] Other (See Comments)    Unknown reaction; "MD told me I was allergic"   Zoloft [Sertraline Hcl] Other (See Comments)    REACTION: Nightmares, Grind teeth really bad   Latex Itching    Itch and blisters from contact   Amitriptyline     Hot, itching, tongue tingling, breakthrough bleeding.   Clindamycin Other (See Comments)   Clindamycin/Lincomycin Hives and Other (See Comments)    Stated that this causes her to get really horse and made her feel really hot   Other Other (See Comments)   Penicillins    Pentosan Polysulfate Sodium Swelling    Throat swelling   Sulfa Antibiotics Other (See Comments)   Topamax [Topiramate]     Says it makes her "constipation and she can't urinate normally"   Uribel [Meth-Hyo-M Bl-Na Phos-Ph Sal] Nausea Only   Uribel [Urelle] Swelling and Other (See Comments)    Patient stated this makes her stomach swell and makes her feel si   Lamotrigine Rash   Sulfamethoxazole-Trimethoprim Rash    Patient developed feelings of dizziness, rash and itchy hands.  Patient then vomited and had diarrhea.    Physical Exam: There were no vitals taken for this visit.   Physical Exam  Diagnostics:    Assessment and Plan: No diagnosis found.  No orders of the defined types were placed in this encounter.   There are no Patient Instructions on file for this visit.  No follow-ups on file.    Thank you for  the opportunity to care for this patient.  Please do not hesitate to contact me with questions.  Gareth Morgan, FNP [facility] [place] [facility] [place]

## 2022-07-31 ENCOUNTER — Ambulatory Visit: Payer: BC Managed Care – PPO | Admitting: Gastroenterology

## 2022-08-05 ENCOUNTER — Ambulatory Visit: Payer: BC Managed Care – PPO | Admitting: Family Medicine

## 2022-08-26 ENCOUNTER — Emergency Department (HOSPITAL_COMMUNITY): Payer: BC Managed Care – PPO

## 2022-08-26 ENCOUNTER — Other Ambulatory Visit: Payer: Self-pay

## 2022-08-26 ENCOUNTER — Encounter (HOSPITAL_COMMUNITY): Payer: Self-pay | Admitting: *Deleted

## 2022-08-26 ENCOUNTER — Emergency Department (HOSPITAL_COMMUNITY)
Admission: EM | Admit: 2022-08-26 | Discharge: 2022-08-26 | Disposition: A | Discharge status: Home / Self Care | Payer: BC Managed Care – PPO | Attending: Emergency Medicine | Admitting: Emergency Medicine

## 2022-08-26 DIAGNOSIS — R059 Cough, unspecified: Secondary | ICD-10-CM | POA: Diagnosis not present

## 2022-08-26 DIAGNOSIS — R109 Unspecified abdominal pain: Secondary | ICD-10-CM | POA: Diagnosis not present

## 2022-08-26 DIAGNOSIS — J988 Other specified respiratory disorders: Secondary | ICD-10-CM | POA: Insufficient documentation

## 2022-08-26 DIAGNOSIS — I700 Atherosclerosis of aorta: Secondary | ICD-10-CM | POA: Diagnosis not present

## 2022-08-26 DIAGNOSIS — J449 Chronic obstructive pulmonary disease, unspecified: Secondary | ICD-10-CM | POA: Insufficient documentation

## 2022-08-26 DIAGNOSIS — R197 Diarrhea, unspecified: Secondary | ICD-10-CM | POA: Diagnosis not present

## 2022-08-26 DIAGNOSIS — R0781 Pleurodynia: Secondary | ICD-10-CM | POA: Diagnosis not present

## 2022-08-26 DIAGNOSIS — R1084 Generalized abdominal pain: Secondary | ICD-10-CM | POA: Diagnosis not present

## 2022-08-26 DIAGNOSIS — Z91040 Latex allergy status: Secondary | ICD-10-CM | POA: Insufficient documentation

## 2022-08-26 DIAGNOSIS — Z7982 Long term (current) use of aspirin: Secondary | ICD-10-CM | POA: Diagnosis not present

## 2022-08-26 DIAGNOSIS — E876 Hypokalemia: Secondary | ICD-10-CM | POA: Diagnosis not present

## 2022-08-26 DIAGNOSIS — R1010 Upper abdominal pain, unspecified: Secondary | ICD-10-CM | POA: Diagnosis not present

## 2022-08-26 DIAGNOSIS — I10 Essential (primary) hypertension: Secondary | ICD-10-CM | POA: Diagnosis not present

## 2022-08-26 DIAGNOSIS — J069 Acute upper respiratory infection, unspecified: Secondary | ICD-10-CM | POA: Diagnosis not present

## 2022-08-26 LAB — CBC WITH DIFFERENTIAL/PLATELET
Abs Immature Granulocytes: 0.04 10*3/uL (ref 0.00–0.07)
Basophils Absolute: 0 10*3/uL (ref 0.0–0.1)
Basophils Relative: 0 %
Eosinophils Absolute: 0.1 10*3/uL (ref 0.0–0.5)
Eosinophils Relative: 1 %
HCT: 39.2 % (ref 36.0–46.0)
Hemoglobin: 13.8 g/dL (ref 12.0–15.0)
Immature Granulocytes: 0 %
Lymphocytes Relative: 28 %
Lymphs Abs: 2.5 10*3/uL (ref 0.7–4.0)
MCH: 34 pg (ref 26.0–34.0)
MCHC: 35.2 g/dL (ref 30.0–36.0)
MCV: 96.6 fL (ref 80.0–100.0)
Monocytes Absolute: 0.6 10*3/uL (ref 0.1–1.0)
Monocytes Relative: 6 %
Neutro Abs: 5.7 10*3/uL (ref 1.7–7.7)
Neutrophils Relative %: 65 %
Platelets: 234 10*3/uL (ref 150–400)
RBC: 4.06 MIL/uL (ref 3.87–5.11)
RDW: 12.9 % (ref 11.5–15.5)
WBC: 8.9 10*3/uL (ref 4.0–10.5)
nRBC: 0 % (ref 0.0–0.2)

## 2022-08-26 LAB — URINALYSIS, ROUTINE W REFLEX MICROSCOPIC
Bilirubin Urine: NEGATIVE
Glucose, UA: NEGATIVE mg/dL
Ketones, ur: NEGATIVE mg/dL
Leukocytes,Ua: NEGATIVE
Nitrite: NEGATIVE
Protein, ur: NEGATIVE mg/dL
Specific Gravity, Urine: 1.005 (ref 1.005–1.030)
pH: 6 (ref 5.0–8.0)

## 2022-08-26 LAB — COMPREHENSIVE METABOLIC PANEL
ALT: 22 U/L (ref 0–44)
AST: 33 U/L (ref 15–41)
Albumin: 4.1 g/dL (ref 3.5–5.0)
Alkaline Phosphatase: 60 U/L (ref 38–126)
Anion gap: 12 (ref 5–15)
BUN: 8 mg/dL (ref 6–20)
CO2: 23 mmol/L (ref 22–32)
Calcium: 8.8 mg/dL — ABNORMAL LOW (ref 8.9–10.3)
Chloride: 91 mmol/L — ABNORMAL LOW (ref 98–111)
Creatinine, Ser: 0.68 mg/dL (ref 0.44–1.00)
GFR, Estimated: >60 mL/min (ref >60)
Glucose, Bld: 134 mg/dL — ABNORMAL HIGH (ref 70–99)
Potassium: 3 mmol/L — ABNORMAL LOW (ref 3.5–5.1)
Sodium: 126 mmol/L — ABNORMAL LOW (ref 135–145)
Total Bilirubin: 1.4 mg/dL — ABNORMAL HIGH (ref 0.3–1.2)
Total Protein: 7.1 g/dL (ref 6.5–8.1)

## 2022-08-26 LAB — LIPASE, BLOOD: Lipase: 23 U/L (ref 11–51)

## 2022-08-26 LAB — ETHANOL: Alcohol, Ethyl (B): <10 mg/dL (ref <10)

## 2022-08-26 MED ORDER — POTASSIUM CHLORIDE CRYS ER 20 MEQ PO TBCR
40.0000 meq | EXTENDED_RELEASE_TABLET | Freq: Once | ORAL | Status: AC
  Administered 2022-08-26: 40 meq via ORAL
  Filled 2022-08-26: qty 2

## 2022-08-26 MED ORDER — IOHEXOL 300 MG/ML  SOLN
100.0000 mL | Freq: Once | INTRAMUSCULAR | Status: AC | PRN
  Administered 2022-08-26: 100 mL via INTRAVENOUS

## 2022-08-26 MED ORDER — SODIUM CHLORIDE 0.9 % IV BOLUS
1000.0000 mL | Freq: Once | INTRAVENOUS | Status: AC
  Administered 2022-08-26: 1000 mL via INTRAVENOUS

## 2022-08-26 MED ORDER — KETOROLAC TROMETHAMINE 30 MG/ML IJ SOLN
30.0000 mg | Freq: Once | INTRAMUSCULAR | Status: AC
  Administered 2022-08-26: 30 mg via INTRAVENOUS
  Filled 2022-08-26: qty 1

## 2022-08-26 MED ORDER — LEVOFLOXACIN 500 MG PO TABS: 500.0000 mg | ORAL_TABLET | Freq: Every day | ORAL | 0 refills | Status: DC

## 2022-08-26 NOTE — ED Provider Notes (Signed)
[facility] DEPT Provider Note   CSN: [id number] Arrival date & time: 08/26/22  1238     History {Add pertinent medical, surgical, social history, OB history to HPI:1} No chief complaint on file.   IMA Sarah Reeves is a 61 y.o. female.  Patient complains of abdominal pain and cough.  Patient has history of COPD and hepatitis   Abdominal Pain      Home Medications Prior to Admission medications   Medication Sig Start Date End Date Taking? Authorizing Provider  levofloxacin (LEVAQUIN) 500 MG tablet Take 1 tablet (500 mg total) by mouth daily. 08/26/22  Yes Milton Ferguson, MD  Ascorbic Acid (VITAMIN C) 1000 MG tablet Take 1,000 mg by mouth daily.    [provider]  aspirin EC 81 MG tablet Take 1 tablet (81 mg total) by mouth daily. 08/04/18   Leonie Man, MD  BREZTRI AEROSPHERE 160-9-4.8 MCG/ACT AERO Inhale 2 puffs into the lungs in the morning and at bedtime. 01/08/22   Dara Hoyer, FNP  cetirizine (ZYRTEC) 10 MG tablet Take 1 tablet (10 mg total) by mouth daily. 10/18/21   Ambs, Kathrine Cords, FNP  chlorpheniramine-HYDROcodone (TUSSIONEX) 10-8 MG/5ML SUER     [provider]  cholecalciferol (VITAMIN D) 1000 UNITS tablet Take 2,000 Units by mouth daily.     [provider]  cyclobenzaprine (FLEXERIL) 10 MG tablet Take 10 mg by mouth 3 (three) times daily as needed for muscle spasms.    [provider]  Dextromethorphan-guaiFENesin (TUSSIN DM) 10-100 MG/5ML liquid Take 5 mLs by mouth every 12 (twelve) hours. 08/09/21   Tacy Learn, PA-C  DULoxetine (CYMBALTA) 30 MG capsule Take 30 mg by mouth daily. 11/29/19   [provider]  estradiol (VIVELLE-DOT) 0.075 MG/24HR Place onto the skin. 07/30/21   [provider]  FLUoxetine (PROZAC) 40 MG capsule Take 40 mg by mouth daily. 05/09/22   [provider]  gabapentin (NEURONTIN) 300 MG capsule Take 300 mg by mouth daily as needed (nerve pain).  11/29/19   [provider]  hydrochlorothiazide (HYDRODIURIL) 50 MG tablet Take 50 mg by mouth daily. 05/09/22   [provider]  HYDROcodone-acetaminophen (NORCO) 10-325 MG tablet Take 1 tablet by mouth every 4 (four) hours as needed. 07/27/21   [provider]  levalbuterol (XOPENEX HFA) 45 MCG/ACT inhaler INHALE 2 PUFFS INTO THE LUNGS EVERY 4 HOURS AS NEEDED FOR WHEEZING 05/20/22   Roney Marion, MD  levalbuterol Penne Lash) 1.25 MG/3ML nebulizer solution Take 1.25 mg by nebulization every 4 (four) hours as needed for wheezing. 10/18/21   Dara Hoyer, FNP  LORazepam (ATIVAN) 2 MG tablet Take 2 mg by mouth in the morning, at noon, and at bedtime. 12/17/19   [provider]  meclizine (ANTIVERT) 25 MG tablet Take 25 mg by mouth 3 (three) times daily as needed for dizziness.    [provider]  montelukast (SINGULAIR) 10 MG tablet Take 1 tablet (10 mg total) by mouth at bedtime. 10/18/21   Dara Hoyer, FNP  nystatin (MYCOSTATIN) 100000 UNIT/ML suspension Take 5 mL swish and swallow by mouth 4 times a day for 5 days 05/07/22   Roney Marion, MD  omeprazole (PRILOSEC) 40 MG capsule Take 1 capsule (40 mg total) by mouth daily. 10/18/21   Dara Hoyer, FNP  pantoprazole (PROTONIX) 40 MG tablet Take 1 tablet (40 mg total) by mouth daily. 04/19/22   Roney Marion, MD  progesterone (PROMETRIUM) 100 MG capsule Take  100 mg by mouth at bedtime. 12/15/20   [provider]  promethazine (PHENERGAN) 25 MG tablet Take 25 mg by mouth every 6 (six) hours as needed for nausea or vomiting.    [provider]  Donnal Debar 200-62.5-25 MCG/ACT AEPB Inhale 1 puff into the lungs daily. Patient not taking: Reported on 06/07/2022 01/18/22   Dara Hoyer, FNP  triamterene-hydrochlorothiazide (DYAZIDE) 37.5-25 MG capsule Take 1 capsule by mouth daily. 11/16/19   [provider]  valACYclovir (VALTREX) 500 MG tablet Take 500 mg by mouth daily as needed  (outbreaks). 11/16/19   [provider]  ziprasidone (GEODON) 40 MG capsule Take 40 mg by mouth daily.    [provider]  zolpidem (AMBIEN) 10 MG tablet Take 10 mg by mouth at bedtime. 03/20/21   [provider]      Allergies    Oysters [shellfish allergy], Zoloft [sertraline hcl], Latex, Amitriptyline, Clindamycin, Clindamycin/lincomycin, Other, Penicillins, Pentosan polysulfate sodium, Sulfa antibiotics, Topamax [topiramate], Uribel [meth-hyo-m bl-na phos-ph sal], Uribel [urelle], Lamotrigine, and Sulfamethoxazole-trimethoprim    Review of Systems   Review of Systems  Gastrointestinal:  Positive for abdominal pain.    Physical Exam Updated Vital Signs BP (!) 147/66   Pulse 84   Temp 98.6 F (37 C)   Resp 17   SpO2 98%  Physical Exam  ED Results / Procedures / Treatments   Labs (all labs ordered are listed, but only abnormal results are displayed) Labs Reviewed  COMPREHENSIVE METABOLIC PANEL - Abnormal; Notable for the following components:      Result Value   Sodium 126 (*)    Potassium 3.0 (*)    Chloride 91 (*)    Glucose, Bld 134 (*)    Calcium 8.8 (*)    Total Bilirubin 1.4 (*)    All other components within normal limits  LIPASE, BLOOD  CBC WITH DIFFERENTIAL/PLATELET  ETHANOL  URINALYSIS, ROUTINE W REFLEX MICROSCOPIC    EKG None  Radiology CT ABDOMEN PELVIS W CONTRAST  Result Date: 08/26/2022 CLINICAL DATA:  Abdominal and back pain for week.  Chronic diarrhea. EXAM: CT ABDOMEN AND PELVIS WITH CONTRAST TECHNIQUE: Multidetector CT imaging of the abdomen and pelvis was performed using the standard protocol following bolus administration of intravenous contrast. RADIATION DOSE REDUCTION: This exam was performed according to the departmental dose-optimization program which includes automated exposure control, adjustment of the mA and/or kV according to patient size and/or use of iterative reconstruction technique. CONTRAST:  154m OMNIPAQUE  IOHEXOL 300 MG/ML  SOLN COMPARISON:  Abdominal ultrasound 08/09/2021 FINDINGS: Lower chest: Mucus in right lower lobe bronchi along with mild bilateral lower lobe cylindrical bronchiectasis. Mild atelectasis or scarring posteromedially in both lower lobes and in the lingula. Hepatobiliary: Dilated common bile duct at 0.8 cm in diameter on image 87 series 5, stable compared to 08/09/2021 my measurement. No definite gallstones. No significant liver lesion. Pancreas: Unremarkable Spleen: Unremarkable Adrenals/Urinary Tract: 0.4 cm hypodense lesion posteriorly in the right mid kidney on image 27 of series 2, technically too small to characterize although statistically likely to represent a small cyst. No imaging follow up is indicated. The urinary bladder and adrenal glands appear normal. Stomach/Bowel: There are few scattered sigmoid colon diverticula which do not appear inflamed. No specific bowel abnormality observed. Vascular/Lymphatic: Atherosclerosis is present, including aortoiliac atherosclerotic disease. No pathologic adenopathy. Reproductive: Retroflexed uterus. No significant abnormality observed. Other: No supplemental non-categorized findings. Musculoskeletal: Unremarkable IMPRESSION: 1. A specific cause for the patient's abdominal pain and diarrhea  is not identified. 2. Mucus in the right lower lobe bronchi with mild bilateral lower lobe cylindrical bronchiectasis. 3. Stable mild dilatation of the common bile duct at 0.8 cm in diameter (unchanged from 08/09/2021). No gallstones are identified. 4. Aortic atherosclerosis. Aortic Atherosclerosis (ICD10-I70.0). Electronically Signed   By: Van Clines M.D.   On: 08/26/2022 19:05   DG Chest 2 View  Result Date: 08/26/2022 CLINICAL DATA:  Cough EXAM: CHEST - 2 VIEW COMPARISON:  03/01/2022 FINDINGS: Cardiac size is within normal limits. There are no signs of pulmonary edema or focal pulmonary consolidation. Increase in AP diameter of chest suggests  possible COPD. There is no pleural effusion or pneumothorax. IMPRESSION: There are no signs of pulmonary edema or focal pulmonary consolidation. Electronically Signed   By: Elmer Picker M.D.   On: 08/26/2022 13:09    Procedures Procedures  {Document cardiac monitor, telemetry assessment procedure when appropriate:1}  Medications Ordered in ED Medications  potassium chloride SA (KLOR-CON M) CR tablet 40 mEq (has no administration in time range)  sodium chloride 0.9 % bolus 1,000 mL (1,000 mLs Intravenous New Bag/Given 08/26/22 1811)  ketorolac (TORADOL) 30 MG/ML injection 30 mg (30 mg Intravenous Given 08/26/22 1812)  iohexol (OMNIPAQUE) 300 MG/ML solution 100 mL (100 mLs Intravenous Contrast Given 08/26/22 1843)    ED Course/ Medical Decision Making/ A&P                           Medical Decision Making Amount and/or Complexity of Data Reviewed Radiology: ordered.  Risk Prescription drug management.   Patient's labs were unremarkable except for mild low sodium and low potassium.  Both of these have been replaced.  CT scan shows some mucus in her bronchus.  Patient is placed on Levaquin and will follow-up with her family doctor and her GI doctor  {Document critical care time when appropriate:1} {Document review of labs and clinical decision tools ie heart score, Chads2Vasc2 etc:1}  {Document your independent review of radiology images, and any outside records:1} {Document your discussion with family members, caretakers, and with consultants:1} {Document social determinants of health affecting pt's care:1} {Document your decision making why or why not admission, treatments were needed:1} Final Clinical Impression(s) / ED Diagnoses Final diagnoses:  Respiratory infection  Pain of upper abdomen    Rx / DC Orders ED Discharge Orders          Ordered    levofloxacin (LEVAQUIN) 500 MG tablet  Daily        08/26/22 1945

## 2022-08-26 NOTE — Discharge Instructions (Signed)
Follow-up with your family doctor within a week to have your potassium rechecked and to see how your symptoms are.  Follow-up with your GI doctor as planned

## 2022-08-26 NOTE — ED Triage Notes (Signed)
BIB EMS pt c/o multiple pain sites, abd, rib, back, for about a week, chronic diarrhea. Pt started on amoxicillin for tooth infection at some point which may have been the start of symptoms. History of Pneumonia. 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

## 2022-08-26 NOTE — ED Provider Triage Note (Signed)
Emergency Medicine Provider Triage Evaluation Note  SHAELA BOER , a 61 y.o. female  was evaluated in triage.  Pt complains of multiple complaints.  She is having abdominal pain "all over".  Admits to her back sometimes in each flank intermittently.  Is been going on for multiple months.  Worse yesterday which is why she is here today.  She also is reporting worsening cough, think she may have "double pneumonia".  Endorses drinking 4 or more drinks of alcohol daily for at least a month..  Review of Systems  Per I Physical Exam  BP (!) 148/87   Pulse (!) 103   Temp 98.3 F (36.8 C)   Resp 20   SpO2 98%  Gen:   Awake, no distress .  Pinpoint pupils Resp:  Normal effort.  Wheezing MSK:   Moves extremities without difficulty  Other:  Abdomen is soft and generally tender.  Nonfocal, mildly tachycardic with regular rhythm.  Medical Decision Making  Medically screening exam initiated at 12:54 PM.  Appropriate orders placed.  Luciel Brickman Camper was informed that the remainder of the evaluation will be completed by another provider, this initial triage assessment does not replace that evaluation, and the importance of remaining in the ED until their evaluation is complete.     Sherrill Raring, PA-C 08/26/22 1256

## 2022-08-29 NOTE — Progress Notes (Signed)
Cardiology Clinic Note   Patient Name: Sarah Reeves Date of Encounter: 08/30/2022  Primary Care Provider:  Dian Queen, MD Primary Cardiologist:  Sarah Hew, MD  Patient Profile    60 year old female with chronic chest pain having had a low risk Myoview in 2016, hypertension, ongoing tobacco abuse, COPD, hepatitis and chronic anxiety.  She cares for her husband who has dementia.  Last seen in the office on 12/27/2019 via telemedicine during [place] pandemic.  She was doing well at that time and was using her elliptical 15 to 20 minutes without chest pain.    Occasionally uses nitroglycerin and aspirin if she did have recurrent pain.  Recently seen in the emergency room at [facility] on 08/26/2022 with complaints of abdominal pain and cough.  She was treated with Levaquin and was to follow-up with primary care and GI.  Past Medical History    Past Medical History:  Diagnosis Date   Angio-edema    Anxiety    Asthma    Complication of anesthesia    takes alot to sedated   COPD (chronic obstructive pulmonary disease) (HCC)    Depression    Fibromyalgia    GERD (gastroesophageal reflux disease)    Hepatitis C    (Dr. Earlean Reeves) Treated with Harvoni March-May 2016   Herpes simplex    Hyperlipemia    Insomnia    Mental disorder    Neuropathy    PONV (postoperative nausea and vomiting)    Urticaria    Past Surgical History:  Procedure Laterality Date   BREAST ENHANCEMENT SURGERY     BREAST LUMPECTOMY WITH RADIOACTIVE SEED LOCALIZATION Left 12/26/2014   Procedure: LEFT BREAST LUMPECTOMY WITH RADIOACTIVE SEED LOCALIZATION;  Surgeon: Sarah Messing III, MD;  Location: [facility];  Service: General;  Laterality: Left;   BREAST REDUCTION SURGERY     COLONOSCOPY     EXPLORATORY LAPAROTOMY     NM MYOVIEW LTD  07/07/15   Normal Myocardial Perfusion Scan. Low risk lexiscan nuclear study with minimal insignificant breast attenuation and normal myocardial perfusion  and function; EF 53% without wall motion abnormalities and normal systolic thickening   TEMPOROMANDIBULAR JOINT SURGERY     TUBAL LIGATION     UPPER GI ENDOSCOPY     x5    Allergies  Allergies  Allergen Reactions   Oysters [Shellfish Allergy] Other (See Comments)    Unknown reaction; "MD told me I was allergic"   Zoloft [Sertraline Hcl] Other (See Comments)    REACTION: Nightmares, Grind teeth really bad   Latex Itching    Itch and blisters from contact   Amitriptyline     Hot, itching, tongue tingling, breakthrough bleeding.   Clindamycin Other (See Comments)   Clindamycin/Lincomycin Hives and Other (See Comments)    Stated that this causes her to get really horse and made her feel really hot   Other Other (See Comments)   Penicillins    Pentosan Polysulfate Sodium Swelling    Throat swelling   Sulfa Antibiotics Other (See Comments)   Topamax [Topiramate]     Says it makes her "constipation and she can't urinate normally"   Uribel [Meth-Hyo-M Bl-Na Phos-Ph Sal] Nausea Only   Uribel [Urelle] Swelling and Other (See Comments)    Patient stated this makes her stomach swell and makes her feel si   Lamotrigine Rash   Sulfamethoxazole-Trimethoprim Rash    Patient developed feelings of dizziness, rash and itchy hands.  Patient then vomited and had diarrhea.  History of Present Illness    Sarah Reeves presents today for follow-up with history of chronic chest pain, with normal Myoview in 2016.  Recent evaluation in ED for respiratory infection treated with Levaquin.  She occasionally would use nitroglycerin or aspirin if she had chest discomfort.  Last seen in 2021 and to remain physically active.  She comes today was multiple somatic complaints.  Her husband died 08/11/21 and she continues to have some anxiety related to that.  She has been having a lot of trouble sleeping and has been warned that she has sleep apnea, and is to follow-up with pulmonary concerning that as her  significant other now has continued to tell her she stops breathing at night.  She continues coughing with congestion status post Levaquin with Tussionex.  She is concerned about hypokalemia which was noted during hospital labs also some hyponatremia.  This was repleted.  She continues to have some muscle cramps and abdominal pain especially under her breasts bilaterally.  She also has chronic back pain.  Home Medications    Current Outpatient Medications  Medication Sig Dispense Refill   Ascorbic Acid (VITAMIN C) 1000 MG tablet Take 1,000 mg by mouth daily.     aspirin EC 81 MG tablet Take 1 tablet (81 mg total) by mouth daily. 90 tablet 3   BREZTRI AEROSPHERE 160-9-4.8 MCG/ACT AERO Inhale 2 puffs into the lungs in the morning and at bedtime. 10.7 g 5   cetirizine (ZYRTEC) 10 MG tablet Take 1 tablet (10 mg total) by mouth daily. 30 tablet 5   chlorpheniramine-HYDROcodone (TUSSIONEX) 10-8 MG/5ML SUER      cholecalciferol (VITAMIN D) 1000 UNITS tablet Take 2,000 Units by mouth daily.      cyclobenzaprine (FLEXERIL) 10 MG tablet Take 10 mg by mouth 3 (three) times daily as needed for muscle spasms.     Dextromethorphan-guaiFENesin (TUSSIN DM) 10-100 MG/5ML liquid Take 5 mLs by mouth every 12 (twelve) hours. 200 mL 0   estradiol (VIVELLE-DOT) 0.075 MG/24HR Place onto the skin.     FLUoxetine (PROZAC) 40 MG capsule Take 40 mg by mouth daily.     gabapentin (NEURONTIN) 300 MG capsule Take 300 mg by mouth daily as needed (nerve pain).     hydrochlorothiazide (HYDRODIURIL) 50 MG tablet Take 50 mg by mouth daily.     HYDROcodone-acetaminophen (NORCO) 10-325 MG tablet Take 1 tablet by mouth every 4 (four) hours as needed.     levalbuterol (XOPENEX HFA) 45 MCG/ACT inhaler INHALE 2 PUFFS INTO THE LUNGS EVERY 4 HOURS AS NEEDED FOR WHEEZING 15 g 0   levalbuterol (XOPENEX) 1.25 MG/3ML nebulizer solution Take 1.25 mg by nebulization every 4 (four) hours as needed for wheezing. 72 mL 5   levofloxacin  (LEVAQUIN) 500 MG tablet Take 1 tablet (500 mg total) by mouth daily. 7 tablet 0   LORazepam (ATIVAN) 2 MG tablet Take 2 mg by mouth in the morning, at noon, and at bedtime.     meclizine (ANTIVERT) 25 MG tablet Take 25 mg by mouth 3 (three) times daily as needed for dizziness.     montelukast (SINGULAIR) 10 MG tablet Take 1 tablet (10 mg total) by mouth at bedtime. 30 tablet 5   nitroGLYCERIN (NITROSTAT) 0.4 MG SL tablet Place 1 tablet (0.4 mg total) under the tongue every 5 (five) minutes as needed for chest pain. 25 tablet 2   nystatin (MYCOSTATIN) 100000 UNIT/ML suspension Take 5 mL swish and swallow by mouth 4 times a day  for 5 days 60 mL 0   pantoprazole (PROTONIX) 40 MG tablet Take 1 tablet (40 mg total) by mouth daily. 90 tablet 1   potassium chloride (KLOR-CON) 10 MEQ tablet Take 1 tablet (10 mEq total) by mouth daily. 30 tablet 3   progesterone (PROMETRIUM) 100 MG capsule Take 100 mg by mouth at bedtime.     promethazine (PHENERGAN) 25 MG tablet Take 25 mg by mouth every 6 (six) hours as needed for nausea or vomiting.     TRELEGY ELLIPTA 200-62.5-25 MCG/ACT AEPB Inhale 1 puff into the lungs daily. 28 each 5   valACYclovir (VALTREX) 500 MG tablet Take 500 mg by mouth daily as needed (outbreaks).     ziprasidone (GEODON) 40 MG capsule Take 40 mg by mouth daily.     zolpidem (AMBIEN) 10 MG tablet Take 10 mg by mouth at bedtime.     No current facility-administered medications for this visit.   Facility-Administered Medications Ordered in Other Visits  Medication Dose Route Frequency Provider Last Rate Last Admin   chlorhexidine (HIBICLENS) 4 % liquid 1 application  1 application  Topical Once Sarah Messing III, MD       chlorhexidine (HIBICLENS) 4 % liquid 1 application  1 application  Topical Once Jovita Kussmaul, MD         Family History    Family History  Problem Relation Age of Onset   Diabetes Mother    Lung disease Mother    Heart disease Mother    Breast cancer Mother     Heart disease Father    Colon cancer Father    Hypothyroidism Brother    Alzheimer's disease Maternal Aunt    Skin cancer Maternal Aunt    Alzheimer's disease Maternal Grandmother    She indicated that her mother is deceased. She indicated that her father is deceased. She indicated that her sister is alive. She indicated that her brother is alive. She indicated that the status of her maternal grandmother is unknown. She indicated that the status of her maternal aunt is unknown.  Social History    Social History   Socioeconomic History   Marital status: Married    Spouse name: Not on file   Number of children: 0   Years of education: 12   Highest education level: Not on file  Occupational History   Occupation: Retired  Tobacco Use   Smoking status: Former    Packs/day: 0.50    Types: Cigarettes   Smokeless tobacco: Never  Vaping Use   Vaping Use: Never used  Substance and Sexual Activity   Alcohol use: Yes    Comment: daily   Drug use: No   Sexual activity: Yes    Birth control/protection: Surgical  Other Topics Concern   Not on file  Social History Narrative   Patient is right handed.   Four cups caffeine per day.   Lives at home with husband.   Social Determinants of Health   Financial Resource Strain: Not on file  Food Insecurity: Not on file  Transportation Needs: Not on file  Physical Activity: Not on file  Stress: Not on file  Social Connections: Not on file  Intimate Partner Violence: Not on file     Review of Systems    General:  No chills, fever, night sweats or weight changes.  Cardiovascular:  No chest pain, dyspnea on exertion, edema, orthopnea, palpitations, paroxysmal nocturnal dyspnea. Dermatological: No rash, lesions/masses Respiratory: No cough, dyspnea Urologic: No hematuria, dysuria  Abdominal:   No nausea, vomiting, diarrhea, bright red blood per rectum, melena, or hematemesis Neurologic:  No visual changes, wkns, changes in mental  status. All other systems reviewed and are otherwise negative except as noted above.     Physical Exam    VS:  BP 110/70   Pulse 91   Ht _0  (1.626 m)   Wt 170 lb (77.1 kg)   SpO2 92%   BMI 29.18 kg/m  , BMI Body mass index is 29.18 kg/m.     GEN: Well nourished, well developed, in no acute distress. HEENT: normal. Neck: Supple, no JVD, carotid bruits, or masses. Cardiac: RRR, no murmurs, rubs, or gallops. No clubbing, cyanosis, edema.  Radials/DP/PT 2+ and equal bilaterally.  Respiratory:  Respirations regular and unlabored, clear to auscultation bilaterally. GI: Soft, nontender, nondistended, BS + x 4. MS: no deformity or atrophy. Skin: warm and dry, no rash. Neuro:  Strength and sensation are intact. Psych: Normal affect.  Accessory Clinical Findings    ECG personally reviewed by me today-normal sinus rhythm heart rate 91 bpm.- No acute changes  Lab Results  Component Value Date   WBC 8.9 08/26/2022   HGB 13.8 08/26/2022   HCT 39.2 08/26/2022   MCV 96.6 08/26/2022   PLT 234 08/26/2022   Lab Results  Component Value Date   CREATININE 0.68 08/26/2022   BUN 8 08/26/2022   NA 126 (L) 08/26/2022   K 3.0 (L) 08/26/2022   CL 91 (L) 08/26/2022   CO2 23 08/26/2022   Lab Results  Component Value Date   ALT 22 08/26/2022   AST 33 08/26/2022   ALKPHOS 60 08/26/2022   BILITOT 1.4 (H) 08/26/2022   Lab Results  Component Value Date   CHOL 223 (H) 11/10/2018   HDL 78 11/10/2018   LDLCALC 120 (H) 11/10/2018   TRIG 125 11/10/2018   CHOLHDL 2.9 11/10/2018    Lab Results  Component Value Date   HGBA1C 6.0 (H) 03/10/2021    Review of Prior Studies: NM Stress test 07/07/2015 Nuclear stress EF: 53%. The left ventricular ejection fraction is mildly decreased (45-54%). There was no ST segment deviation noted during stress. Normal myocardial perfusion and function.   Low risk lexiscan nuclear study with minimal insignificant breast attenuation and normal  myocardial perfusion and function; EF 53% without wall motion abnormalities and normal systolic thickening.    Assessment & Plan   1.  Hypokalemia: May be causing some of her chest discomfort.  I reviewed all of her medications and she is on high-dose HCTZ 50 mg daily.  And was listed to be on triamterene HCTZ as well.  She states that she is no longer taking the triamterene HCTZ.  May need to decrease the hydrochlorothiazide dose.  I am going to start her on potassium replacement 10 mill equivalents daily and repeat her be met in 1 month.  2.  Hypertension: Is on 50 mg of hydrochlorothiazide daily.  Was also listed as taking Travatan HCTZ as well.  I have reviewed her medications with her and she states she is no longer taking triamterene HCTZ.  May need to consider decreasing hydrochlorothiazide or changing to a different medication should her blood pressure need controlling.  I have asked her to take her blood pressure every day at the same time every day and record this and bring it back with her on follow-up appointment so that we can see what her blood pressure is doing at home.  If she  continues to have some hypokalemia we may need to consider taking her off HCTZ and changing her back to the triamterene HCTZ as this will have less effects on her potassium.  3.  Self-reported OSA: She is being followed up by pulmonology and will likely have a sleep study test.  She says she is to make the appointment this week.  4.  Chronic GERD symptoms: Is to see GI within the next month for further evaluation.  Current medicines are reviewed at length with the patient today.  I have spent 25 min's  dedicated to the care of this patient on the date of this encounter to include pre-visit review of records, assessment, management and diagnostic testing,with shared decision making.  Signed, Phill Myron. West Pugh, ANP, AACC   08/30/2022 10:31 AM      Office [phone] Fax [phone]  Notice: This  dictation was prepared with Dragon dictation along with smaller phrase technology. Any transcriptional errors that result from this process are unintentional and may not be corrected upon review.

## 2022-08-30 ENCOUNTER — Ambulatory Visit: Payer: BC Managed Care – PPO | Attending: Adult Health | Admitting: Adult Health

## 2022-08-30 ENCOUNTER — Encounter: Payer: Self-pay | Admitting: Adult Health

## 2022-08-30 VITALS — BP 110/70 | HR 91 | Ht 64.0 in | Wt 170.0 lb

## 2022-08-30 DIAGNOSIS — E876 Hypokalemia: Secondary | ICD-10-CM | POA: Diagnosis not present

## 2022-08-30 DIAGNOSIS — K219 Gastro-esophageal reflux disease without esophagitis: Secondary | ICD-10-CM

## 2022-08-30 DIAGNOSIS — R079 Chest pain, unspecified: Secondary | ICD-10-CM | POA: Diagnosis not present

## 2022-08-30 DIAGNOSIS — I10 Essential (primary) hypertension: Secondary | ICD-10-CM | POA: Diagnosis not present

## 2022-08-30 DIAGNOSIS — T502X5A Adverse effect of carbonic-anhydrase inhibitors, benzothiadiazides and other diuretics, initial encounter: Secondary | ICD-10-CM

## 2022-08-30 MED ORDER — POTASSIUM CHLORIDE ER 10 MEQ PO TBCR: 10.0000 meq | EXTENDED_RELEASE_TABLET | Freq: Every day | ORAL | 3 refills | Status: DC

## 2022-08-30 MED ORDER — NITROGLYCERIN 0.4 MG SL SUBL: 0.4000 mg | SUBLINGUAL_TABLET | SUBLINGUAL | 2 refills | Status: DC | PRN

## 2022-08-30 MED ORDER — POTASSIUM CHLORIDE ER 10 MEQ PO TBCR: 10.0000 meq | EXTENDED_RELEASE_TABLET | Freq: Every day | ORAL | 2 refills | Status: DC

## 2022-08-30 NOTE — Patient Instructions (Signed)
Medication Instructions:  Start Potassium Chloride 10 meq ( Take 1 Tablet Daily). *If you need a refill on your cardiac medications before your next appointment, please call your pharmacy*   Lab Work: BMET :   To Be Done In 4 Weeks If you have labs (blood work) drawn today and your tests are completely normal, you will receive your results only by: [place] (if you have MyChart) OR A paper copy in the mail If you have any lab test that is abnormal or we need to change your treatment, we will call you to review the results.   Testing/Procedures: No Testing   Follow-Up: At [facility], you and your health needs are our priority.  As part of our continuing mission to provide you with exceptional heart care, we have created designated Provider Care Teams.  These Care Teams include your primary Cardiologist (physician) and Advanced Practice Providers (APPs -  Physician Assistants and Nurse Practitioners) who all work together to provide you with the care you need, when you need it.  We recommend signing up for the patient portal called "MyChart".  Sign up information is provided on this After Visit Summary.  MyChart is used to connect with patients for Virtual Visits (Telemedicine).  Patients are able to view lab/test results, encounter notes, upcoming appointments, etc.  Non-urgent messages can be sent to your provider as well.   To learn more about what you can do with MyChart, go to [email or web address].    Your next appointment:   6 week(s)  The format for your next appointment:   In Person  Provider:   Jory Sims, DNP, ANP

## 2022-09-02 ENCOUNTER — Telehealth: Payer: Self-pay | Admitting: Family Medicine

## 2022-09-02 MED ORDER — LEVALBUTEROL TARTRATE 45 MCG/ACT IN AERO: INHALATION_SPRAY | RESPIRATORY_TRACT | 0 refills | Status: DC

## 2022-09-02 NOTE — Telephone Encounter (Signed)
Called patient - DOB/Pharmacy verified - advised I needed clarity on which inhaler she needed a refill on. Patient stated she needed a refill on Levoalbuterol sent to [facility].

## 2022-09-02 NOTE — Telephone Encounter (Signed)
Pt request refill for inhaler

## 2022-09-04 ENCOUNTER — Ambulatory Visit (INDEPENDENT_AMBULATORY_CARE_PROVIDER_SITE_OTHER): Payer: BC Managed Care – PPO | Admitting: Adult Health

## 2022-09-04 ENCOUNTER — Encounter: Payer: Self-pay | Admitting: Adult Health

## 2022-09-04 VITALS — BP 134/88 | HR 91 | Ht 64.0 in | Wt 171.0 lb

## 2022-09-04 DIAGNOSIS — Z716 Tobacco abuse counseling: Secondary | ICD-10-CM

## 2022-09-04 DIAGNOSIS — R0683 Snoring: Secondary | ICD-10-CM | POA: Diagnosis not present

## 2022-09-04 DIAGNOSIS — Z720 Tobacco use: Secondary | ICD-10-CM | POA: Diagnosis not present

## 2022-09-04 DIAGNOSIS — G4700 Insomnia, unspecified: Secondary | ICD-10-CM | POA: Diagnosis not present

## 2022-09-04 DIAGNOSIS — J4489 Other specified chronic obstructive pulmonary disease: Secondary | ICD-10-CM

## 2022-09-04 NOTE — Progress Notes (Signed)
$'@Patient'd$  ID: Sarah Reeves, female    DOB: 08-29-1961, 61 y.o.   MRN: [id number]  Chief Complaint  Patient presents with   Consult    Referring provider: Dian Queen, MD  HPI: 61 year old female active smoker seen for sleep consult September 04, 2022 for snoring, daytime sleepiness and restless sleep. Medical history significant for chronic chest pain, hypertension, COPD, chronic anxiety hepatitis C (status post Harvoni treatment May 2016)  TEST/EVENTS :   09/04/2022 Sleep consult  Patient presents for sleep consult today.  Patient complains of snoring, restless sleep and daytime sleepiness.  Patient complains that she feels tired all the time.  Wakes up unrefreshed.  Significant other reports episodes of apnea.  Takes Ambien due to insomnia , reports that she sleepwalks and makes snacks during night . Does not take everynight . Occasionally takes ativan for anxiety .   Epworth score is 9.  Typically gets sleepy if she sits and watches TV and in the afternoon hours.  Typically goes to bed about 8 PM.  Only takes a few minutes to go to sleep.  Is up 2 or 3 times at night.  Gets up at 7 AM.  Patient's weight is up 40 pounds over the last 2 years.  Current weight is 171 pounds with a BMI at 29.  Never had a sleep study before.  No history of congestive heart failure or stroke.  No removable dental work.  No symptoms suspicious for cataplexy or sleep paralysis. Patient says she does not sleep well also because she has frequent reflux.  Is followed by GI and has an upcoming appointment this week.  Patient has a history of heavy smoking.  1 pack a day x45 years.  We discussed smoking cessation.  We also discussed lung cancer screening program.  Patient is agreeable to referral.  She has a history of COPD and asthma is followed for asthma and allergy.  Is currently on Trelegy inhaler daily  Social history.  Patient is widowed.  Lives with her boyfriend.  Retired from [employer], [employer].  Smokes 1  pack daily.  Drinks 3 vodka drinks daily.  No history of drug use. No children.  Family history positive for allergies, heart disease and cancer       09/04/2022   10:00 AM  Results of the Epworth flowsheet  Sitting and reading 3  Watching TV 3  Sitting, inactive in a public place (e.g. a theatre or a meeting) 0  As a passenger in a car for an hour without a break 0  Lying down to rest in the afternoon when circumstances permit 3  Sitting and talking to someone 0  Sitting quietly after a lunch without alcohol 0  In a car, while stopped for a few minutes in traffic 0  Total score 9     Allergies  Allergen Reactions   Oysters [Shellfish Allergy] Other (See Comments)    Unknown reaction; "MD told me I was allergic"   Zoloft [Sertraline Hcl] Other (See Comments)    REACTION: Nightmares, Grind teeth really bad   Latex Itching    Itch and blisters from contact   Amitriptyline     Hot, itching, tongue tingling, breakthrough bleeding.   Clindamycin Other (See Comments)   Clindamycin/Lincomycin Hives and Other (See Comments)    Stated that this causes her to get really horse and made her feel really hot   Other Other (See Comments)   Penicillins    Pentosan Polysulfate Sodium Swelling  Throat swelling   Sulfa Antibiotics Other (See Comments)   Topamax [Topiramate]     Says it makes her "constipation and she can't urinate normally"   Uribel [Meth-Hyo-M Bl-Na Phos-Ph Sal] Nausea Only   Uribel [Urelle] Swelling and Other (See Comments)    Patient stated this makes her stomach swell and makes her feel si   Lamotrigine Rash   Sulfamethoxazole-Trimethoprim Rash    Patient developed feelings of dizziness, rash and itchy hands.  Patient then vomited and had diarrhea.    Immunization History  Administered Date(s) Administered   Influenza,inj,Quad PF,6+ Mos 06/27/2018   Pneumococcal Polysaccharide-23 06/27/2018   Td 10/21/1998    Past Medical History:  Diagnosis Date    Angio-edema    Anxiety    Asthma    Complication of anesthesia    takes alot to sedated   COPD (chronic obstructive pulmonary disease) (HCC)    Depression    Fibromyalgia    GERD (gastroesophageal reflux disease)    Hepatitis C    (Dr. Earlean Shawl) Treated with Harvoni March-May 2016   Herpes simplex    Hyperlipemia    Insomnia    Mental disorder    Neuropathy    PONV (postoperative nausea and vomiting)    Urticaria     Tobacco History: Social History   Tobacco Use  Smoking Status Every Day   Packs/day: 1.00   Types: Cigarettes   Passive exposure: Current  Smokeless Tobacco Never  Tobacco Comments   I ppd 09/04/2022 PAP    Ready to quit: Not Answered Counseling given: Not Answered Tobacco comments: I ppd 09/04/2022 PAP    Outpatient Medications Prior to Visit  Medication Sig Dispense Refill   Ascorbic Acid (VITAMIN C) 1000 MG tablet Take 1,000 mg by mouth daily.     aspirin EC 81 MG tablet Take 1 tablet (81 mg total) by mouth daily. 90 tablet 3   BREZTRI AEROSPHERE 160-9-4.8 MCG/ACT AERO Inhale 2 puffs into the lungs in the morning and at bedtime. 10.7 g 5   cetirizine (ZYRTEC) 10 MG tablet Take 1 tablet (10 mg total) by mouth daily. 30 tablet 5   cholecalciferol (VITAMIN D) 1000 UNITS tablet Take 2,000 Units by mouth daily.      cyclobenzaprine (FLEXERIL) 10 MG tablet Take 10 mg by mouth 3 (three) times daily as needed for muscle spasms.     estradiol (VIVELLE-DOT) 0.075 MG/24HR Place onto the skin.     gabapentin (NEURONTIN) 300 MG capsule Take 300 mg by mouth daily as needed (nerve pain).     hydrochlorothiazide (HYDRODIURIL) 50 MG tablet Take 50 mg by mouth daily.     HYDROcodone-acetaminophen (NORCO) 10-325 MG tablet Take 1 tablet by mouth every 4 (four) hours as needed.     LORazepam (ATIVAN) 2 MG tablet Take 2 mg by mouth in the morning, at noon, and at bedtime.     meclizine (ANTIVERT) 25 MG tablet Take 25 mg by mouth 3 (three) times daily as needed for dizziness.      montelukast (SINGULAIR) 10 MG tablet Take 1 tablet (10 mg total) by mouth at bedtime. 30 tablet 5   pantoprazole (PROTONIX) 40 MG tablet Take 1 tablet (40 mg total) by mouth daily. 90 tablet 1   potassium chloride (KLOR-CON) 10 MEQ tablet Take 1 tablet (10 mEq total) by mouth daily. 30 tablet 3   progesterone (PROMETRIUM) 100 MG capsule Take 100 mg by mouth at bedtime.     promethazine (PHENERGAN) 25 MG tablet  Take 25 mg by mouth every 6 (six) hours as needed for nausea or vomiting.     TRELEGY ELLIPTA 200-62.5-25 MCG/ACT AEPB Inhale 1 puff into the lungs daily. 28 each 5   zolpidem (AMBIEN) 10 MG tablet Take 10 mg by mouth at bedtime.     chlorpheniramine-HYDROcodone (TUSSIONEX) 10-8 MG/5ML SUER      Dextromethorphan-guaiFENesin (TUSSIN DM) 10-100 MG/5ML liquid Take 5 mLs by mouth every 12 (twelve) hours. 200 mL 0   FLUoxetine (PROZAC) 40 MG capsule Take 40 mg by mouth daily. (Patient not taking: Reported on 09/04/2022)     levalbuterol (XOPENEX HFA) 45 MCG/ACT inhaler INHALE 2 PUFFS INTO THE LUNGS EVERY 4 HOURS AS NEEDED FOR WHEEZING 15 g 0   levalbuterol (XOPENEX) 1.25 MG/3ML nebulizer solution Take 1.25 mg by nebulization every 4 (four) hours as needed for wheezing. 72 mL 5   levofloxacin (LEVAQUIN) 500 MG tablet Take 1 tablet (500 mg total) by mouth daily. 7 tablet 0   nitroGLYCERIN (NITROSTAT) 0.4 MG SL tablet Place 1 tablet (0.4 mg total) under the tongue every 5 (five) minutes as needed for chest pain. (Patient not taking: Reported on 09/04/2022) 25 tablet 2   nystatin (MYCOSTATIN) 100000 UNIT/ML suspension Take 5 mL swish and swallow by mouth 4 times a day for 5 days (Patient not taking: Reported on 09/04/2022) 60 mL 0   valACYclovir (VALTREX) 500 MG tablet Take 500 mg by mouth daily as needed (outbreaks). (Patient not taking: Reported on 09/04/2022)     ziprasidone (GEODON) 40 MG capsule Take 40 mg by mouth daily.     Facility-Administered Medications Prior to Visit  Medication Dose  Route Frequency Provider Last Rate Last Admin   chlorhexidine (HIBICLENS) 4 % liquid 1 application  1 application  Topical Once Autumn Messing III, MD       chlorhexidine (HIBICLENS) 4 % liquid 1 application  1 application  Topical Once Autumn Messing III, MD         Review of Systems:   Constitutional:   No  weight loss, night sweats,  Fevers, chills,  +fatigue, or  lassitude.  HEENT:   No headaches,  Difficulty swallowing,  Tooth/dental problems, or  Sore throat,                No sneezing, itching, ear ache, nasal congestion, post nasal drip,   CV:  No chest pain,  Orthopnea, PND, swelling in lower extremities, anasarca, dizziness, palpitations, syncope.   GI  No abdominal pain, nausea, vomiting, diarrhea, change in bowel habits, loss of appetite, bloody stools.   Resp: No shortness of breath with exertion or at rest.  No excess mucus, no productive cough,  No non-productive cough,  No coughing up of blood.  No change in color of mucus.  No wheezing.  No chest wall deformity  Skin: no rash or lesions.  GU: no dysuria, change in color of urine, no urgency or frequency.  No flank pain, no hematuria   MS:  No joint pain or swelling.  No decreased range of motion.  No back pain.    Physical Exam  BP 134/88 (BP Location: Left Arm, Patient Position: Sitting, Cuff Size: Normal)   Pulse 91   Ht '5\' 4"'$  (1.626 m)   Wt 171 lb (77.6 kg)   SpO2 95%   BMI 29.35 kg/m   GEN: A/Ox3; pleasant , NAD, well nourished    HEENT:  [place]/AT,  NOSE-clear, THROAT-clear, no lesions, no postnasal drip or exudate noted.  Class II-III MP airway  NECK:  Supple w/ fair ROM; no JVD; normal carotid impulses w/o bruits; no thyromegaly or nodules palpated; no lymphadenopathy.    RESP  Clear  P & A; w/o, wheezes/ rales/ or rhonchi. no accessory muscle use, no dullness to percussion  CARD:  RRR, no m/r/g, no peripheral edema, pulses intact, no cyanosis or clubbing.  GI:   Soft & nt; nml bowel sounds; no organomegaly  or masses detected.   Musco: Warm bil, no deformities or joint swelling noted.   Neuro: alert, no focal deficits noted.    Skin: Warm, no lesions or rashes    Lab Results:  CBC   BNP    Component Value Date/Time   BNP 66.0 03/09/2021 1300    ProBNP No results found for: "PROBNP"  Imaging:         No data to display          No results found for: "NITRICOXIDE"      Assessment & Plan:   Snoring Snoring, restless sleep, daytime sleepiness, witnessed apneic events all concerning for underlying sleep apnea.  We will set patient up for home sleep study.  Patient education was given on sleep apnea.  - discussed how weight can impact sleep and risk for sleep disordered breathing - discussed options to assist with weight loss: combination of diet modification, cardiovascular and strength training exercises   - had an extensive discussion regarding the adverse health consequences related to untreated sleep disordered breathing - specifically discussed the risks for hypertension, coronary artery disease, cardiac dysrhythmias, cerebrovascular disease, and diabetes - lifestyle modification discussed   - discussed how sleep disruption can increase risk of accidents, particularly when driving - safe driving practices were discussed   Plan  Patient Instructions  Set up for home sleep study  Work on healthy sleep regimen  Use caution with sedating medications  Do not drive if sleepy   Refer to Lung cancer CT screening program.   Follow up in 3 months to discuss sleep study results and treatment plan .     Tobacco use Heavy smoking history.  Smoking cessation was discussed in detail.  Referral to the lung cancer screening program was discussed  Insomnia Insomnia with intermittent use of Ambien and Ativan.  Healthy sleep regimen discussed.  Discussed cautious use with sedating medications and alcohol.  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  Smoking cessation discussed.  Referral to the lung cancer screening program     Rexene Edison, NP 09/04/2022

## 2022-09-04 NOTE — Patient Instructions (Signed)
Set up for home sleep study  Work on healthy sleep regimen  Use caution with sedating medications  Do not drive if sleepy   Refer to Lung cancer CT screening program.   Follow up in 3 months to discuss sleep study results and treatment plan .

## 2022-09-04 NOTE — Assessment & Plan Note (Signed)
Heavy smoking history.  Smoking cessation was discussed in detail.  Referral to the lung cancer screening program was discussed

## 2022-09-04 NOTE — Assessment & Plan Note (Addendum)
Insomnia with intermittent use of Ambien and Ativan.  Healthy sleep regimen discussed.  Discussed cautious use with sedating medications and alcohol.

## 2022-09-04 NOTE — Progress Notes (Signed)
Reviewed and agree with assessment/plan.   Chesley Mires, MD [facility] 09/04/2022, 11:27 AM Pager:  [phone]

## 2022-09-04 NOTE — Assessment & Plan Note (Signed)
Snoring, restless sleep, daytime sleepiness, witnessed apneic events all concerning for underlying sleep apnea.  We will set patient up for home sleep study.  Patient education was given on sleep apnea.  - discussed how weight can impact sleep and risk for sleep disordered breathing - discussed options to assist with weight loss: combination of diet modification, cardiovascular and strength training exercises   - had an extensive discussion regarding the adverse health consequences related to untreated sleep disordered breathing - specifically discussed the risks for hypertension, coronary artery disease, cardiac dysrhythmias, cerebrovascular disease, and diabetes - lifestyle modification discussed   - discussed how sleep disruption can increase risk of accidents, particularly when driving - safe driving practices were discussed   Plan  Patient Instructions  Set up for home sleep study  Work on healthy sleep regimen  Use caution with sedating medications  Do not drive if sleepy   Refer to Lung cancer CT screening program.   Follow up in 3 months to discuss sleep study results and treatment plan .

## 2022-09-04 NOTE — Assessment & Plan Note (Signed)
Continue follow-up with asthma and allergy.  Smoking cessation discussed.  Referral to the lung cancer screening program

## 2022-09-06 ENCOUNTER — Ambulatory Visit: Payer: BC Managed Care – PPO | Admitting: Gastroenterology

## 2022-09-09 ENCOUNTER — Ambulatory Visit (INDEPENDENT_AMBULATORY_CARE_PROVIDER_SITE_OTHER): Payer: BC Managed Care – PPO | Admitting: Family Medicine

## 2022-09-09 ENCOUNTER — Other Ambulatory Visit: Payer: Self-pay

## 2022-09-09 ENCOUNTER — Encounter: Payer: Self-pay | Admitting: Family Medicine

## 2022-09-09 VITALS — BP 128/74 | HR 104 | Temp 98.1°F | Resp 22 | Ht 64.0 in | Wt 168.8 lb

## 2022-09-09 DIAGNOSIS — K219 Gastro-esophageal reflux disease without esophagitis: Secondary | ICD-10-CM

## 2022-09-09 DIAGNOSIS — J4489 Other specified chronic obstructive pulmonary disease: Secondary | ICD-10-CM | POA: Diagnosis not present

## 2022-09-09 DIAGNOSIS — B370 Candidal stomatitis: Secondary | ICD-10-CM

## 2022-09-09 DIAGNOSIS — J310 Chronic rhinitis: Secondary | ICD-10-CM

## 2022-09-09 DIAGNOSIS — Z720 Tobacco use: Secondary | ICD-10-CM | POA: Diagnosis not present

## 2022-09-09 MED ORDER — MONTELUKAST SODIUM 10 MG PO TABS: 10.0000 mg | ORAL_TABLET | Freq: Every day | ORAL | 5 refills | Status: DC

## 2022-09-09 MED ORDER — BREZTRI AEROSPHERE 160-9-4.8 MCG/ACT IN AERO: 2.0000 | INHALATION_SPRAY | Freq: Two times a day (BID) | RESPIRATORY_TRACT | 5 refills | Status: DC

## 2022-09-09 MED ORDER — LEVALBUTEROL TARTRATE 45 MCG/ACT IN AERO: INHALATION_SPRAY | RESPIRATORY_TRACT | 0 refills | Status: DC

## 2022-09-09 MED ORDER — CETIRIZINE HCL 10 MG PO TABS: 10.0000 mg | ORAL_TABLET | Freq: Every day | ORAL | 5 refills | Status: AC

## 2022-09-09 NOTE — Addendum Note (Signed)
Addended by: Alvin Critchley, Kendyn Zaman on: 09/09/2022 02:56 PM   Modules accepted: Orders

## 2022-09-09 NOTE — Patient Instructions (Addendum)
Severe persistent asthma/COPD  Continue montelukast 10 mg once a day to prevent cough or wheeze Continue Breztri-2 puffs twice a day with a spacer to prevent cough or wheeze. Rinse your mouth and spit after each use. Call the clinic if you experience thrush.   Continue levalbuterol 2 puffs every 4 hours as needed for cough or wheeze OR Instead use albuterol 0.083% solution via nebulizer one unit vial every 4 hours as needed for cough or wheeze You may use levalbuterol 2 puffs 5-15 minutes before activity to decrease cough or wheeze Call the clinic to discuss restarting Tezspire if interested  Chronic rhinitis Continue cetirizine 10 mg once a day as needed for runny nose or itch Continue Flonase 2 sprays in each nostril once a day as needed for stuffy nose.  In the right nostril, point the applicator out toward the right ear. In the left nostril, point the applicator out toward the left ear Continue saline nasal rinses as needed for nasal symptoms. Use this before any medicated nasal sprays for best result  Reflux  Begin omeprazole 40 mg once a day. Take this medication about 30 minutes before your first meal. This will replace Protonix that was not covered well by insurance Begin famotidine 40 mg once at bedtime for better control of reflux Continue dietary and lifestyle modifications as listed below  Tobacco use Try to cut down and quit smoking Call the clinic if this treatment plan is not working well for you  Oral candidiasis Rinse and spit after each use of your inhaler  Follow up in the clinic in 4 months or sooner if needed

## 2022-09-09 NOTE — Progress Notes (Signed)
[place] [address] Dept: [phone]  FOLLOW UP NOTE  Patient ID: Sarah Reeves, female    DOB: [birth year]  Age: 61 y.o. MRN: [id number] Date of Office Visit: 09/09/2022  Assessment  Chief Complaint: Asthma (Wheezing at night. Acid reflex at night.)  HPI Sarah Reeves is a 61 year old female who presents the clinic for follow-up visit.  She was last seen in this clinic on 06/07/2022 by Dr. Edison Pace for evaluation of asthma/COPD overlap syndrome, chronic rhinitis, reflux, tobacco use, oral candidiasis, and obstructive sleep apnea.  At today's visit, she reports her asthma/COPD has been moderately well controlled with symptoms including wheeze occurring during the night and cough occurring 1 night last week.  She continues montelukast 10 mg when she remembers which is about 2 out of 7 days of the week and is using either Trelegy 200 or Breztri.  She reports that her insurance has not covered Trelegy 200 well, however, she has recently paid the deductible and now can afford Trelegy 200. However, she reports that her insurance does cover Brezrti which is the medication she prefers.  She does report experiencing symptoms of oral candidiasis after using Trelegy and Breztri and reports that she frequently forgets to rinse her mouth after using inhaled corticosteroids.  She reports that she last used levalbuterol about 1 month ago with relief of symptoms. She has stopped using Tezspire due to gastrointestinal issues and is not interested in restarting at this time.  She has had a consult for sleep apnea on 09/04/2022 with [facility] and will perform a home sleep study in the near future. Allergic rhinitis is reported as moderately well controlled with occasional postnasal drainage occurring at nighttime.  She continues cetirizine 10 mg once a day and occasionally uses nasal saline rinses.  She is not currently using a nasal steroid spray.  Her last environmental allergy skin  testing was on 08/11/2018 and was negative to the adult panel.  Reflux is reported as moderately well controlled with heartburn occurring several times a month.  She continues pantoprazole and reports this medication is not well covered by her insurance.  She has previously tried Nexium and omeprazole with moderate relief of symptoms.  She continues to smoke 10 cigarettes a day.  She has an upcoming appointment for a low-dose CT lung cancer screening scan.  Her current medications are listed in the chart.   Drug Allergies:  Allergies  Allergen Reactions   Oysters [Shellfish Allergy] Other (See Comments)    Unknown reaction; "MD told me I was allergic"   Zoloft [Sertraline Hcl] Other (See Comments)    REACTION: Nightmares, Grind teeth really bad   Latex Itching    Itch and blisters from contact   Amitriptyline     Hot, itching, tongue tingling, breakthrough bleeding.   Clindamycin Other (See Comments)   Clindamycin/Lincomycin Hives and Other (See Comments)    Stated that this causes her to get really horse and made her feel really hot   Other Other (See Comments)   Penicillins    Pentosan Polysulfate Sodium Swelling    Throat swelling   Sulfa Antibiotics Other (See Comments)   Topamax [Topiramate]     Says it makes her "constipation and she can't urinate normally"   Uribel [Meth-Hyo-M Bl-Na Phos-Ph Sal] Nausea Only   Uribel [Urelle] Swelling and Other (See Comments)    Patient stated this makes her stomach swell and makes her feel si   Lamotrigine Rash  Sulfamethoxazole-Trimethoprim Rash    Patient developed feelings of dizziness, rash and itchy hands.  Patient then vomited and had diarrhea.    Physical Exam: BP 128/74   Pulse (!) 104   Temp 98.1 F (36.7 C) (Temporal)   Resp (!) 22   Ht '5\' 4"'$  (1.626 m)   Wt 168 lb 12.8 oz (76.6 kg)   SpO2 98%   BMI 28.97 kg/m    Physical Exam Vitals reviewed.  Constitutional:      Appearance: Normal appearance.  HENT:     Head:  Normocephalic and atraumatic.     Right Ear: Tympanic membrane normal.     Left Ear: Tympanic membrane normal.     Nose:     Comments: Bilateral nares slightly erythematous with clear nasal drainage noted.  Pharynx normal.  Ears normal.  Eyes normal.    Mouth/Throat:     Pharynx: Oropharynx is clear.  Eyes:     Conjunctiva/sclera: Conjunctivae normal.  Cardiovascular:     Rate and Rhythm: Normal rate and regular rhythm.     Heart sounds: Normal heart sounds. No murmur heard. Pulmonary:     Effort: Pulmonary effort is normal.     Breath sounds: Normal breath sounds.     Comments: Lungs clear to auscultation Musculoskeletal:        General: Normal range of motion.     Cervical back: Normal range of motion and neck supple.  Skin:    General: Skin is warm and dry.  Neurological:     Mental Status: She is alert and oriented to person, place, and time.  Psychiatric:        Mood and Affect: Mood normal.        Behavior: Behavior normal.        Thought Content: Thought content normal.        Judgment: Judgment normal.     Diagnostics: 2.  FVC 2.74, FEV1 2.01.  Predicted FVC 3.33, predicted FEV1 2.57.  Spirometry indicates normal ventilatory function.  Assessment and Plan: 1. Asthma-COPD overlap syndrome   2. Chronic rhinitis   3. Gastroesophageal reflux disease, unspecified whether esophagitis present   4. Tobacco use   5. Oral candidiasis     Meds ordered this encounter  Medications   BREZTRI AEROSPHERE 160-9-4.8 MCG/ACT AERO    Sig: Inhale 2 puffs into the lungs in the morning and at bedtime.    Dispense:  10.7 g    Refill:  5   cetirizine (ZYRTEC) 10 MG tablet    Sig: Take 1 tablet (10 mg total) by mouth daily.    Dispense:  30 tablet    Refill:  5   levalbuterol (XOPENEX HFA) 45 MCG/ACT inhaler    Sig: INHALE 2 PUFFS INTO THE LUNGS EVERY 4 HOURS AS NEEDED FOR WHEEZING    Dispense:  15 g    Refill:  0   montelukast (SINGULAIR) 10 MG tablet    Sig: Take 1 tablet (10  mg total) by mouth at bedtime.    Dispense:  30 tablet    Refill:  5    Patient Instructions   Severe persistent asthma/COPD  Continue montelukast 10 mg once a day to prevent cough or wheeze Continue Breztri-2 puffs twice a day with a spacer to prevent cough or wheeze. Rinse your mouth and spit after each use. Call the clinic if you experience thrush.   Continue levalbuterol 2 puffs every 4 hours as needed for cough or wheeze OR Instead use  albuterol 0.083% solution via nebulizer one unit vial every 4 hours as needed for cough or wheeze You may use levalbuterol 2 puffs 5-15 minutes before activity to decrease cough or wheeze Call the clinic to discuss restarting Tezspire if interested  Chronic rhinitis Continue cetirizine 10 mg once a day as needed for runny nose or itch Continue Flonase 2 sprays in each nostril once a day as needed for stuffy nose.  In the right nostril, point the applicator out toward the right ear. In the left nostril, point the applicator out toward the left ear Continue saline nasal rinses as needed for nasal symptoms. Use this before any medicated nasal sprays for best result  Reflux  Begin omeprazole 40 mg once a day. Take this medication about 30 minutes before your first meal. This will replace Protonix that was not covered well by insurance Begin famotidine 40 mg once at bedtime for better control of reflux Continue dietary and lifestyle modifications as listed below  Tobacco use Try to cut down and quit smoking Call the clinic if this treatment plan is not working well for you  Oral candidiasis Rinse and spit after each use of your inhaler  Follow up in the clinic in 4 months or sooner if needed  Return in about 4 months (around 01/08/2023), or if symptoms worsen or fail to improve.    Thank you for the opportunity to care for this patient.  Please do not hesitate to contact me with questions.  Gareth Morgan, FNP [facility]

## 2022-09-17 ENCOUNTER — Telehealth: Payer: Self-pay | Admitting: Cardiology

## 2022-09-17 DIAGNOSIS — M47896 Other spondylosis, lumbar region: Secondary | ICD-10-CM | POA: Diagnosis not present

## 2022-09-17 DIAGNOSIS — M5106 Intervertebral disc disorders with myelopathy, lumbar region: Secondary | ICD-10-CM | POA: Diagnosis not present

## 2022-09-17 DIAGNOSIS — X500XXA Overexertion from strenuous movement or load, initial encounter: Secondary | ICD-10-CM | POA: Diagnosis not present

## 2022-09-17 NOTE — Telephone Encounter (Signed)
Pt c/o BP issue: STAT if pt c/o blurred vision, one-sided weakness or slurred speech  1. What are your last 5 BP readings?   177/99  HR 104  9:30 pm 160/88  156/87 116/70 135/86  2. Are you having any other symptoms (ex. Dizziness, headache, blurred vision, passed out)?  Pain in her head and pain in left chest area and very constipated  3. What is your BP issue?    Patient stated she thinks the hydrochlorothiazide (HYDRODIURIL) 50 MG tablet medication is not helping her BP.  Patient stated she has been taking a laxative and has had unusual stool.

## 2022-09-17 NOTE — Telephone Encounter (Signed)
Patient stated her BP will get high and she thinks it happens when she is having back pain. She stated she is under a lot of stress due to still mourning her husband's death, it being the Holidays, and dealing with oral surgery. She also stated she thinks the K+  caused her constipation, yet she reported having diarrhea for a while. She will see GI on 12/27. For her constipation, she took laxative and had bloody, mucoid stool. Recommended she take K+ with food and plenty of water. She stated her steroid injection has not helped back pain. BPs have been: 177/99  HR 104  9:30 pm 160/88  156/87 116/70 135/86

## 2022-09-24 DIAGNOSIS — R102 Pelvic and perineal pain: Secondary | ICD-10-CM | POA: Diagnosis not present

## 2022-10-08 NOTE — Progress Notes (Deleted)
Cardiology Clinic Note   Patient Name: Sarah Reeves Date of Encounter: 10/08/2022  Primary Care Provider:  Dian Queen, MD Primary Cardiologist:  Sarah Hew, MD  Patient Profile    61 year old female with chronic chest pain having had a low risk Myoview in 2016, hypertension, ongoing tobacco abuse, COPD, hepatitis and chronic anxiety.  She cares for her husband who has dementia.  Last seen in the office on 12/27/2019 via telemedicine during [place] pandemic.  She was doing well at that time and was using her elliptical 15 to 20 minutes without chest pain.  She was last seen in the office on 08/30/2022 with multiple complaints and significant anxiety related to the death of her husband recently in October 2023.  She was found to have hypokalemia and was started on low-dose potassium of 10 mEq daily with repeat BMET prior to this office visit.  She remained hypertensive, and was also having symptoms of OSA.  This was being followed by primary pulmonologist Dr. Halford Reeves.    Past Medical History    Past Medical History:  Diagnosis Date   Angio-edema    Anxiety    Asthma    Complication of anesthesia    takes alot to sedated   COPD (chronic obstructive pulmonary disease) (HCC)    Depression    Fibromyalgia    GERD (gastroesophageal reflux disease)    Hepatitis C    (Dr. Earlean Reeves) Treated with Harvoni March-May 2016   Herpes simplex    Hyperlipemia    Insomnia    Mental disorder    Neuropathy    PONV (postoperative nausea and vomiting)    Urticaria    Past Surgical History:  Procedure Laterality Date   BREAST ENHANCEMENT SURGERY     BREAST LUMPECTOMY WITH RADIOACTIVE SEED LOCALIZATION Left 12/26/2014   Procedure: LEFT BREAST LUMPECTOMY WITH RADIOACTIVE SEED LOCALIZATION;  Surgeon: Sarah Messing III, MD;  Location: [facility];  Service: General;  Laterality: Left;   BREAST REDUCTION SURGERY     COLONOSCOPY     EXPLORATORY LAPAROTOMY     NM MYOVIEW LTD  07/07/15    Normal Myocardial Perfusion Scan. Low risk lexiscan nuclear study with minimal insignificant breast attenuation and normal myocardial perfusion and function; EF 53% without wall motion abnormalities and normal systolic thickening   TEMPOROMANDIBULAR JOINT SURGERY     TUBAL LIGATION     UPPER GI ENDOSCOPY     x5    Allergies  Allergies  Allergen Reactions   Oysters [Shellfish Allergy] Other (See Comments)    Unknown reaction; "MD told me I was allergic"   Zoloft [Sertraline Hcl] Other (See Comments)    REACTION: Nightmares, Grind teeth really bad   Latex Itching    Itch and blisters from contact   Amitriptyline     Hot, itching, tongue tingling, breakthrough bleeding.   Clindamycin Other (See Comments)   Clindamycin/Lincomycin Hives and Other (See Comments)    Stated that this causes her to get really horse and made her feel really hot   Other Other (See Comments)   Penicillins    Pentosan Polysulfate Sodium Swelling    Throat swelling   Sulfa Antibiotics Other (See Comments)   Topamax [Topiramate]     Says it makes her "constipation and she can't urinate normally"   Uribel [Meth-Hyo-M Bl-Na Phos-Ph Sal] Nausea Only   Uribel [Urelle] Swelling and Other (See Comments)    Patient stated this makes her stomach swell and makes her feel si  Lamotrigine Rash   Sulfamethoxazole-Trimethoprim Rash    Patient developed feelings of dizziness, rash and itchy hands.  Patient then vomited and had diarrhea.    History of Present Illness    [name] presents today for ongoing assessment and management of hypertension as well as chest discomfort with history as outlined above.  She was placed on low-dose potassium supplement due to hypokalemia and frequent diarrhea.  She was also to continue HCTZ 50 mg daily.  She was to take her blood pressure at home.  She did call our office on 09/17/2022 with elevated blood pressure readings, ranging from 177/99 to 116/70.   Home Medications     Current Outpatient Medications  Medication Sig Dispense Refill   Ascorbic Acid (VITAMIN C) 1000 MG tablet Take 1,000 mg by mouth daily.     aspirin EC 81 MG tablet Take 1 tablet (81 mg total) by mouth daily. 90 tablet 3   BREZTRI AEROSPHERE 160-9-4.8 MCG/ACT AERO Inhale 2 puffs into the lungs in the morning and at bedtime. 10.7 g 5   cetirizine (ZYRTEC) 10 MG tablet Take 1 tablet (10 mg total) by mouth daily. 30 tablet 5   chlorpheniramine-HYDROcodone (TUSSIONEX) 10-8 MG/5ML SUER      cholecalciferol (VITAMIN D) 1000 UNITS tablet Take 2,000 Units by mouth daily.      cyclobenzaprine (FLEXERIL) 10 MG tablet Take 10 mg by mouth 3 (three) times daily as needed for muscle spasms.     Dextromethorphan-guaiFENesin (TUSSIN DM) 10-100 MG/5ML liquid Take 5 mLs by mouth every 12 (twelve) hours. (Patient not taking: Reported on 09/09/2022) 200 mL 0   estradiol (VIVELLE-DOT) 0.075 MG/24HR Place onto the skin.     FLUoxetine (PROZAC) 40 MG capsule Take 40 mg by mouth daily.     gabapentin (NEURONTIN) 300 MG capsule Take 300 mg by mouth daily as needed (nerve pain).     hydrochlorothiazide (HYDRODIURIL) 50 MG tablet Take 50 mg by mouth daily.     HYDROcodone-acetaminophen (NORCO) 10-325 MG tablet Take 1 tablet by mouth every 4 (four) hours as needed.     levalbuterol (XOPENEX HFA) 45 MCG/ACT inhaler INHALE 2 PUFFS INTO THE LUNGS EVERY 4 HOURS AS NEEDED FOR WHEEZING 15 g 0   levalbuterol (XOPENEX) 1.25 MG/3ML nebulizer solution Take 1.25 mg by nebulization every 4 (four) hours as needed for wheezing. 72 mL 5   levofloxacin (LEVAQUIN) 500 MG tablet Take 1 tablet (500 mg total) by mouth daily. (Patient not taking: Reported on 09/09/2022) 7 tablet 0   LORazepam (ATIVAN) 2 MG tablet Take 2 mg by mouth in the morning, at noon, and at bedtime.     meclizine (ANTIVERT) 25 MG tablet Take 25 mg by mouth 3 (three) times daily as needed for dizziness.     montelukast (SINGULAIR) 10 MG tablet Take 1 tablet (10 mg total)  by mouth at bedtime. 30 tablet 5   nitroGLYCERIN (NITROSTAT) 0.4 MG SL tablet Place 1 tablet (0.4 mg total) under the tongue every 5 (five) minutes as needed for chest pain. 25 tablet 2   nystatin (MYCOSTATIN) 100000 UNIT/ML suspension Take 5 mL swish and swallow by mouth 4 times a day for 5 days 60 mL 0   pantoprazole (PROTONIX) 40 MG tablet Take 1 tablet (40 mg total) by mouth daily. 90 tablet 1   potassium chloride (KLOR-CON) 10 MEQ tablet Take 1 tablet (10 mEq total) by mouth daily. 30 tablet 3   progesterone (PROMETRIUM) 100 MG capsule Take 100 mg by  mouth at bedtime.     promethazine (PHENERGAN) 25 MG tablet Take 25 mg by mouth every 6 (six) hours as needed for nausea or vomiting.     valACYclovir (VALTREX) 500 MG tablet Take 500 mg by mouth daily as needed (outbreaks).     ziprasidone (GEODON) 40 MG capsule Take 40 mg by mouth daily. (Patient not taking: Reported on 09/09/2022)     zolpidem (AMBIEN) 10 MG tablet Take 10 mg by mouth at bedtime.     No current facility-administered medications for this visit.   Facility-Administered Medications Ordered in Other Visits  Medication Dose Route Frequency Provider Last Rate Last Admin   chlorhexidine (HIBICLENS) 4 % liquid 1 application  1 application  Topical Once Sarah Messing III, MD       chlorhexidine (HIBICLENS) 4 % liquid 1 application  1 application  Topical Once Jovita Kussmaul, MD         Family History    Family History  Problem Relation Age of Onset   Diabetes Mother    Lung disease Mother    Heart disease Mother    Breast cancer Mother    Heart disease Father    Colon cancer Father    Hypothyroidism Brother    Alzheimer's disease Maternal Aunt    Skin cancer Maternal Aunt    Alzheimer's disease Maternal Grandmother    She indicated that her mother is deceased. She indicated that her father is deceased. She indicated that her sister is alive. She indicated that her brother is alive. She indicated that the status of her  maternal grandmother is unknown. She indicated that the status of her maternal aunt is unknown.  Social History    Social History   Socioeconomic History   Marital status: Married    Spouse name: Not on file   Number of children: 0   Years of education: 12   Highest education level: Not on file  Occupational History   Occupation: Retired  Tobacco Use   Smoking status: Every Day    Packs/day: 1.00    Types: Cigarettes    Passive exposure: Current   Smokeless tobacco: Never   Tobacco comments:    I ppd 09/04/2022 PAP   Vaping Use   Vaping Use: Never used  Substance and Sexual Activity   Alcohol use: Yes    Comment: daily   Drug use: No   Sexual activity: Yes    Birth control/protection: Surgical  Other Topics Concern   Not on file  Social History Narrative   Patient is right handed.   Four cups caffeine per day.   Lives at home with husband.   Social Determinants of Health   Financial Resource Strain: Not on file  Food Insecurity: Not on file  Transportation Needs: Not on file  Physical Activity: Not on file  Stress: Not on file  Social Connections: Not on file  Intimate Partner Violence: Not on file     Review of Systems    General:  No chills, fever, night sweats or weight changes.  Cardiovascular:  No chest pain, dyspnea on exertion, edema, orthopnea, palpitations, paroxysmal nocturnal dyspnea. Dermatological: No rash, lesions/masses Respiratory: No cough, dyspnea Urologic: No hematuria, dysuria Abdominal:   No nausea, vomiting, diarrhea, bright red blood per rectum, melena, or hematemesis Neurologic:  No visual changes, wkns, changes in mental status. All other systems reviewed and are otherwise negative except as noted above.     Physical Exam    VS:  There were no vitals taken for this visit. , BMI There is no height or weight on file to calculate BMI.     GEN: Well nourished, well developed, in no acute distress. HEENT: normal. Neck: Supple, no  JVD, carotid bruits, or masses. Cardiac: RRR, no murmurs, rubs, or gallops. No clubbing, cyanosis, edema.  Radials/DP/PT 2+ and equal bilaterally.  Respiratory:  Respirations regular and unlabored, clear to auscultation bilaterally. GI: Soft, nontender, nondistended, BS + x 4. MS: no deformity or atrophy. Skin: warm and dry, no rash. Neuro:  Strength and sensation are intact. Psych: Normal affect.  Accessory Clinical Findings    ECG personally reviewed by me today- *** - No acute changes  Lab Results  Component Value Date   WBC 8.9 08/26/2022   HGB 13.8 08/26/2022   HCT 39.2 08/26/2022   MCV 96.6 08/26/2022   PLT 234 08/26/2022   Lab Results  Component Value Date   CREATININE 0.68 08/26/2022   BUN 8 08/26/2022   NA 126 (L) 08/26/2022   K 3.0 (L) 08/26/2022   CL 91 (L) 08/26/2022   CO2 23 08/26/2022   Lab Results  Component Value Date   ALT 22 08/26/2022   AST 33 08/26/2022   ALKPHOS 60 08/26/2022   BILITOT 1.4 (H) 08/26/2022   Lab Results  Component Value Date   CHOL 223 (H) 11/10/2018   HDL 78 11/10/2018   LDLCALC 120 (H) 11/10/2018   TRIG 125 11/10/2018   CHOLHDL 2.9 11/10/2018    Lab Results  Component Value Date   HGBA1C 6.0 (H) 03/10/2021    Review of Prior Studies: NM Stress Test 07/07/2015 Nuclear stress EF: 53%. The left ventricular ejection fraction is mildly decreased (45-54%). There was no ST segment deviation noted during stress. Normal myocardial perfusion and function.   Low risk lexiscan nuclear study with minimal insignificant breast attenuation and normal myocardial perfusion and function; EF 53% without wall motion abnormalities and normal systolic thickening.    Assessment & Plan   1.  ***    Current medicines are reviewed at length with the patient today.  I have spent *** min's  dedicated to the care of this patient on the date of this encounter to include pre-visit review of records, assessment, management and diagnostic  testing,with shared decision making. Signed, Phill Myron. West Pugh, ANP, [place]   10/08/2022 11:32 AM      Office [phone] Fax [phone]  Notice: This dictation was prepared with Dragon dictation along with smaller phrase technology. Any transcriptional errors that result from this process are unintentional and may not be corrected upon review.

## 2022-10-09 NOTE — Progress Notes (Deleted)
Cardiology Clinic Note   Patient Name: Sarah Reeves Date of Encounter: 10/09/2022  Primary Care Provider:  Dian Queen, MD Primary Cardiologist:  Glenetta Hew, MD  Patient Profile    Sarah Reeves is a 61 y.o. female with a past medical history of hypertension, tobacco abuse, COPD, hepatitis, chronic anxiety who presents to the clinic today for six-week follow-up.   Past Medical History    Past Medical History:  Diagnosis Date   Angio-edema    Anxiety    Asthma    Complication of anesthesia    takes alot to sedated   COPD (chronic obstructive pulmonary disease) (HCC)    Depression    Fibromyalgia    GERD (gastroesophageal reflux disease)    Hepatitis C    (Dr. Earlean Shawl) Treated with Harvoni March-May 2016   Herpes simplex    Hyperlipemia    Insomnia    Mental disorder    Neuropathy    PONV (postoperative nausea and vomiting)    Urticaria    Past Surgical History:  Procedure Laterality Date   BREAST ENHANCEMENT SURGERY     BREAST LUMPECTOMY WITH RADIOACTIVE SEED LOCALIZATION Left 12/26/2014   Procedure: LEFT BREAST LUMPECTOMY WITH RADIOACTIVE SEED LOCALIZATION;  Surgeon: Autumn Messing III, MD;  Location: [facility];  Service: General;  Laterality: Left;   BREAST REDUCTION SURGERY     COLONOSCOPY     EXPLORATORY LAPAROTOMY     NM MYOVIEW LTD  07/07/15   Normal Myocardial Perfusion Scan. Low risk lexiscan nuclear study with minimal insignificant breast attenuation and normal myocardial perfusion and function; EF 53% without wall motion abnormalities and normal systolic thickening   TEMPOROMANDIBULAR JOINT SURGERY     TUBAL LIGATION     UPPER GI ENDOSCOPY     x5    Allergies  Allergies  Allergen Reactions   Oysters [Shellfish Allergy] Other (See Comments)    Unknown reaction; "MD told me I was allergic"   Zoloft [Sertraline Hcl] Other (See Comments)    REACTION: Nightmares, Grind teeth really bad   Latex Itching    Itch and blisters from  contact   Amitriptyline     Hot, itching, tongue tingling, breakthrough bleeding.   Clindamycin Other (See Comments)   Clindamycin/Lincomycin Hives and Other (See Comments)    Stated that this causes her to get really horse and made her feel really hot   Other Other (See Comments)   Penicillins    Pentosan Polysulfate Sodium Swelling    Throat swelling   Sulfa Antibiotics Other (See Comments)   Topamax [Topiramate]     Says it makes her "constipation and she can't urinate normally"   Uribel [Meth-Hyo-M Bl-Na Phos-Ph Sal] Nausea Only   Uribel [Urelle] Swelling and Other (See Comments)    Patient stated this makes her stomach swell and makes her feel si   Lamotrigine Rash   Sulfamethoxazole-Trimethoprim Rash    Patient developed feelings of dizziness, rash and itchy hands.  Patient then vomited and had diarrhea.    History of Present Illness    Sarah Reeves has a past medical history of: Chronic chest pain.  Myoview stress test 07/07/2015: Normal, low risk study.  Hypertension.  Tobacco abuse.  Hepatitis.  COPD.   [name] was first evaluated by Dr. Ellyn Hack in September 2016 for chest pain and lightheadedness. She had a normal, low risk myoview stress test in September 2016. Patient was not seen again until October 2019 when she was evaluated  by Dr. Ellyn Hack for BP concerns, chest pain and dizzy spells. She had recently been treated as an inpatient for a behavioral health illness. Her chest pain was described as it was previously and it was felt it could be related to panic/anxiety. She was prescribed NTG spray in the event pain was due to coronary spasm. Dizzy spells were thought to be related to behavioral health medications. BP was stable at the time. At follow-up she was doing well without recurrence of chest pain.   Most recently, patient was seen in the office by Jory Sims, NP on 08/30/2022. At that time she was under a lot of stress/anxiety secondary to the death of  her husband in 20-Aug-2021. She had been seen in the ED prior to her office visit on 08/26/2022 and diagnosed with a respiratory infection that was treated with Levaquin. ED labs showed patient was hypokalemic with potassium 3.0. She was started on PO potassium 10 mEq with the potential of decreasing/changing HCTZ if hypokalemia continues to be an issue. She was asked to keep a BP log. She also had an upcoming appointment with pulmonology to discuss a sleep study. Patient called in BP readings on 09/17/2022: 177/99 160/88 156/87 116/70 135/86 She feels her high BP is related to back pain and oral surgery.   Today, patient ***  BP log? BMP today?   Chronic chest pain. Normal, low risk myoview stress test September 2016. Patient denies *** Continue aspirin and prn SL NTG.  Hypertension. BP today ***. Patient *** Hypokalemia. Potassium 3 on 08/26/2022 while in the ED for a respiratory infection. Will repeat BMP *** Tobacco abuse. Patient ***   Home Medications    No outpatient medications have been marked as taking for the 10/11/22 encounter (Appointment) with Lendon Colonel, NP.    Family History    Family History  Problem Relation Age of Onset   Diabetes Mother    Lung disease Mother    Heart disease Mother    Breast cancer Mother    Heart disease Father    Colon cancer Father    Hypothyroidism Brother    Alzheimer's disease Maternal Aunt    Skin cancer Maternal Aunt    Alzheimer's disease Maternal Grandmother    She indicated that her mother is deceased. She indicated that her father is deceased. She indicated that her sister is alive. She indicated that her brother is alive. She indicated that the status of her maternal grandmother is unknown. She indicated that the status of her maternal aunt is unknown.   Social History    Social History   Socioeconomic History   Marital status: Married    Spouse name: Not on file   Number of children: 0   Years of education:  12   Highest education level: Not on file  Occupational History   Occupation: Retired  Tobacco Use   Smoking status: Every Day    Packs/day: 1.00    Types: Cigarettes    Passive exposure: Current   Smokeless tobacco: Never   Tobacco comments:    I ppd 09/04/2022 PAP   Vaping Use   Vaping Use: Never used  Substance and Sexual Activity   Alcohol use: Yes    Comment: daily   Drug use: No   Sexual activity: Yes    Birth control/protection: Surgical  Other Topics Concern   Not on file  Social History Narrative   Patient is right handed.   Four cups caffeine per day.  Lives at home with husband.   Social Determinants of Health   Financial Resource Strain: Not on file  Food Insecurity: Not on file  Transportation Needs: Not on file  Physical Activity: Not on file  Stress: Not on file  Social Connections: Not on file  Intimate Partner Violence: Not on file     Review of Systems    General: *** No chills, fever, night sweats or weight changes.  Cardiovascular:  No chest pain, dyspnea on exertion, edema, orthopnea, palpitations, paroxysmal nocturnal dyspnea. Dermatological: No rash, lesions/masses Respiratory: No cough, dyspnea Urologic: No hematuria, dysuria Abdominal:   No nausea, vomiting, diarrhea, bright red blood per rectum, melena, or hematemesis Neurologic:  No visual changes, weakness, changes in mental status. All other systems reviewed and are otherwise negative except as noted above.  Physical Exam    VS:  There were no vitals taken for this visit. , BMI There is no height or weight on file to calculate BMI. GEN: *** Well nourished, well developed, in no acute distress. HEENT: Normal. Neck: Supple, no JVD, carotid bruits, or masses. Cardiac: RRR, no murmurs, rubs, or gallops. No clubbing, cyanosis, edema.  Radials/DP/PT 2+ and equal bilaterally.  Respiratory:  Respirations regular and unlabored, clear to auscultation bilaterally. GI: Soft, nontender,  nondistended. MS: No deformity or atrophy. Skin: Warm and dry, no rash. Neuro: Strength and sensation are intact. Psych: Normal affect.  Accessory Clinical Findings    The following studies were reviewed for this visit: ***  Recent Labs: 08/26/2022: ALT 22; BUN 8; Creatinine, Ser 0.68; Hemoglobin 13.8; Platelets 234; Potassium 3.0; Sodium 126   Recent Lipid Panel    Component Value Date/Time   CHOL 223 (H) 11/10/2018 1014   TRIG 125 11/10/2018 1014   HDL 78 11/10/2018 1014   CHOLHDL 2.9 11/10/2018 1014   CHOLHDL 3.3 06/27/2018 0623   VLDL 23 06/27/2018 0623   LDLCALC 120 (H) 11/10/2018 1014    No BP recorded.  {Refresh Note OR Click here to enter BP  :1}***    ECG personally reviewed by me today: ***  No significant changes from ***  {Does this patient have ATRIAL FIBRILLATION?:[phone]}   Assessment & Plan   ***      Disposition: ***   Justice Britain. Noya Santarelli, NP-C     10/09/2022, 3:11 PM [facility] [address] Office [phone] Fax [phone]   I spent *** minutes examining this patient, reviewing medications, and using patient centered shared decision making involving her cardiac care.  Prior to her visit I spent greater than 20 minutes reviewing her past medical history,  medications, and prior cardiac tests.

## 2022-10-11 ENCOUNTER — Ambulatory Visit: Payer: BC Managed Care – PPO | Attending: Adult Health | Admitting: Adult Health

## 2022-10-16 ENCOUNTER — Encounter: Payer: Self-pay | Admitting: Gastroenterology

## 2022-10-16 ENCOUNTER — Other Ambulatory Visit (INDEPENDENT_AMBULATORY_CARE_PROVIDER_SITE_OTHER): Payer: BC Managed Care – PPO

## 2022-10-16 ENCOUNTER — Ambulatory Visit (INDEPENDENT_AMBULATORY_CARE_PROVIDER_SITE_OTHER): Payer: BC Managed Care – PPO | Admitting: Gastroenterology

## 2022-10-16 ENCOUNTER — Ambulatory Visit: Payer: BC Managed Care – PPO | Admitting: Gastroenterology

## 2022-10-16 VITALS — BP 140/92 | HR 98 | Ht 64.0 in | Wt 168.0 lb

## 2022-10-16 DIAGNOSIS — R197 Diarrhea, unspecified: Secondary | ICD-10-CM | POA: Diagnosis not present

## 2022-10-16 DIAGNOSIS — Z86010 Personal history of colonic polyps: Secondary | ICD-10-CM

## 2022-10-16 DIAGNOSIS — K219 Gastro-esophageal reflux disease without esophagitis: Secondary | ICD-10-CM | POA: Diagnosis not present

## 2022-10-16 DIAGNOSIS — R1010 Upper abdominal pain, unspecified: Secondary | ICD-10-CM

## 2022-10-16 DIAGNOSIS — R079 Chest pain, unspecified: Secondary | ICD-10-CM | POA: Diagnosis not present

## 2022-10-16 LAB — BASIC METABOLIC PANEL
BUN/Creatinine Ratio: 16 (ref 12–28)
BUN: 13 mg/dL (ref 6–23)
BUN: 12 mg/dL (ref 8–27)
CO2: 30 mEq/L (ref 19–32)
CO2: 24 mmol/L (ref 20–29)
Calcium: 9.8 mg/dL (ref 8.4–10.5)
Calcium: 9.8 mg/dL (ref 8.7–10.3)
Chloride: 93 mEq/L — ABNORMAL LOW (ref 96–112)
Chloride: 93 mmol/L — ABNORMAL LOW (ref 96–106)
Creatinine, Ser: 0.83 mg/dL (ref 0.40–1.20)
Creatinine, Ser: 0.74 mg/dL (ref 0.57–1.00)
GFR: 76.67 mL/min (ref >60.00)
Glucose, Bld: 115 mg/dL — ABNORMAL HIGH (ref 70–99)
Glucose: 129 mg/dL — ABNORMAL HIGH (ref 70–99)
Potassium: 3 mEq/L — ABNORMAL LOW (ref 3.5–5.1)
Potassium: 3.6 mmol/L (ref 3.5–5.2)
Sodium: 133 mEq/L — ABNORMAL LOW (ref 135–145)
Sodium: 136 mmol/L (ref 134–144)
eGFR: 93 mL/min/{1.73_m2} (ref >59)

## 2022-10-16 LAB — C-REACTIVE PROTEIN: CRP: <1 mg/dL (ref 0.5–20.0)

## 2022-10-16 MED ORDER — NA SULFATE-K SULFATE-MG SULF 17.5-3.13-1.6 GM/177ML PO SOLN: 1.0000 | Freq: Once | ORAL | 0 refills | Status: AC

## 2022-10-16 NOTE — Progress Notes (Addendum)
HPI : Sarah Reeves is a very pleasant 61 year old female with a history of asthma/COPD, anxiety, depression, fibromyalgia and history of hepatitis C status post eradication with Harvoni who is referred to [place] by Dr. Vernice Jefferson for further evaluation of persistent diarrhea.  Patient states that she started having altered bowel habits several months ago.  She reports having a tick bite and was prescribed antibiotics (she does not recall which).  She she was only able to take the antibiotics for a week (was supposed to take 2 weeks), but had to stop because of significant GI side effects.  She then developed significant constipation, which she used over-the-counter laxatives, and since then she has had persistent diarrhea.  Currently, she averages 5-6 bowel movements per day.  Stools are almost always loose or watery.  She also sees lots of mucus in the stool.  She denies seeing any blood.  She has urgency but no incontinence.  She is not having significant lower abdominal pain, but is bothered by right upper quadrant and epigastric pain.  This is also been noticed for the last couple months.  This pain is often worsened with meals. She denies any problems with chronic abdominal pain or problems with her bowel habits prior to the onset of the symptoms earlier this year. She has been taking probiotics and Keefer which she says has helped with her diarrhea some.  She has a history of GERD, with her most bothersome symptom being copious amounts of phlegm in the mornings.  She has been taking Nexium over-the-counter most days and this has been controlling the symptoms well.   She was previously followed by Dr. Earlean Shawl.  She is states that she was supposed to have a follow-up colonoscopy 2 years ago, but she did not follow through with it because she was taking care of her sick husband, who is subsequently passed away.  She thinks she has had polyps.  She also reports having an upper endoscopy with Dr.  Earlean Shawl at some point.  I do not have access to these records at the time of this appointment.   She reports difficulty with her COPD during the winter months, with bothersome symptoms of cough and shortness of breath.   Past Medical History:  Diagnosis Date   Angio-edema    Anxiety    Arthritis    Asthma    Complication of anesthesia    takes alot to sedated   COPD (chronic obstructive pulmonary disease) (HCC)    Depression    Fibromyalgia    GERD (gastroesophageal reflux disease)    Hepatitis C    (Dr. Earlean Shawl) Treated with Harvoni March-May 2016   Herpes simplex    Hyperlipemia    Insomnia    Mental disorder    Neuropathy    PONV (postoperative nausea and vomiting)    Sleep apnea    Urticaria      Past Surgical History:  Procedure Laterality Date   BREAST ENHANCEMENT SURGERY     BREAST LUMPECTOMY WITH RADIOACTIVE SEED LOCALIZATION Left 12/26/2014   Procedure: LEFT BREAST LUMPECTOMY WITH RADIOACTIVE SEED LOCALIZATION;  Surgeon: Autumn Messing III, MD;  Location: [facility];  Service: General;  Laterality: Left;   BREAST REDUCTION SURGERY     COLONOSCOPY     EXPLORATORY LAPAROTOMY     NM MYOVIEW LTD  07/07/15   Normal Myocardial Perfusion Scan. Low risk lexiscan nuclear study with minimal insignificant breast attenuation and normal myocardial perfusion and function; EF 53% without  wall motion abnormalities and normal systolic thickening   TEMPOROMANDIBULAR JOINT SURGERY     TUBAL LIGATION     UPPER GI ENDOSCOPY     x5   Family History  Problem Relation Age of Onset   Diabetes Mother    Lung disease Mother    Heart disease Mother    Breast cancer Mother    Heart disease Father    Colon cancer Father    Hypothyroidism Brother    Alzheimer's disease Maternal Grandmother    Alzheimer's disease Maternal Aunt    Skin cancer Maternal Aunt    Stomach cancer Neg Hx    Esophageal cancer Neg Hx    Social History   Tobacco Use   Smoking status: Every Day     Packs/day: 1.00    Types: Cigarettes    Passive exposure: Current   Smokeless tobacco: Never   Tobacco comments:    I ppd 09/04/2022 PAP   Vaping Use   Vaping Use: Never used  Substance Use Topics   Alcohol use: Yes    Comment: daily   Drug use: No   Current Outpatient Medications  Medication Sig Dispense Refill   Ascorbic Acid (VITAMIN C) 1000 MG tablet Take 1,000 mg by mouth daily.     aspirin EC 81 MG tablet Take 1 tablet (81 mg total) by mouth daily. 90 tablet 3   BREZTRI AEROSPHERE 160-9-4.8 MCG/ACT AERO Inhale 2 puffs into the lungs in the morning and at bedtime. 10.7 g 5   cetirizine (ZYRTEC) 10 MG tablet Take 1 tablet (10 mg total) by mouth daily. 30 tablet 5   chlorpheniramine-HYDROcodone (TUSSIONEX) 10-8 MG/5ML SUER      cholecalciferol (VITAMIN D) 1000 UNITS tablet Take 2,000 Units by mouth daily.      cyclobenzaprine (FLEXERIL) 10 MG tablet Take 10 mg by mouth 3 (three) times daily as needed for muscle spasms.     estradiol (VIVELLE-DOT) 0.075 MG/24HR Place onto the skin.     FLUoxetine (PROZAC) 40 MG capsule Take 40 mg by mouth daily.     gabapentin (NEURONTIN) 300 MG capsule Take 300 mg by mouth daily as needed (nerve pain).     hydrochlorothiazide (HYDRODIURIL) 50 MG tablet Take 50 mg by mouth daily.     levalbuterol (XOPENEX HFA) 45 MCG/ACT inhaler INHALE 2 PUFFS INTO THE LUNGS EVERY 4 HOURS AS NEEDED FOR WHEEZING 15 g 0   levalbuterol (XOPENEX) 1.25 MG/3ML nebulizer solution Take 1.25 mg by nebulization every 4 (four) hours as needed for wheezing. 72 mL 5   LORazepam (ATIVAN) 2 MG tablet Take 2 mg by mouth in the morning, at noon, and at bedtime.     meclizine (ANTIVERT) 25 MG tablet Take 25 mg by mouth 3 (three) times daily as needed for dizziness.     montelukast (SINGULAIR) 10 MG tablet Take 1 tablet (10 mg total) by mouth at bedtime. 30 tablet 5   nitroGLYCERIN (NITROSTAT) 0.4 MG SL tablet Place 1 tablet (0.4 mg total) under the tongue every 5 (five) minutes as  needed for chest pain. 25 tablet 2   pantoprazole (PROTONIX) 40 MG tablet Take 1 tablet (40 mg total) by mouth daily. 90 tablet 1   potassium chloride (KLOR-CON) 10 MEQ tablet Take 1 tablet (10 mEq total) by mouth daily. 30 tablet 3   progesterone (PROMETRIUM) 100 MG capsule Take 200 mg by mouth at bedtime.     promethazine (PHENERGAN) 25 MG tablet Take 25 mg by mouth every 6 (  six) hours as needed for nausea or vomiting.     valACYclovir (VALTREX) 500 MG tablet Take 500 mg by mouth daily as needed (outbreaks).     zolpidem (AMBIEN) 10 MG tablet Take 10 mg by mouth at bedtime.     Dextromethorphan-guaiFENesin (TUSSIN DM) 10-100 MG/5ML liquid Take 5 mLs by mouth every 12 (twelve) hours. (Patient not taking: Reported on 09/09/2022) 200 mL 0   HYDROcodone-acetaminophen (NORCO) 10-325 MG tablet Take 1 tablet by mouth every 4 (four) hours as needed. (Patient not taking: Reported on 10/16/2022)     levofloxacin (LEVAQUIN) 500 MG tablet Take 1 tablet (500 mg total) by mouth daily. (Patient not taking: Reported on 09/09/2022) 7 tablet 0   nystatin (MYCOSTATIN) 100000 UNIT/ML suspension Take 5 mL swish and swallow by mouth 4 times a day for 5 days (Patient not taking: Reported on 10/16/2022) 60 mL 0   ziprasidone (GEODON) 40 MG capsule Take 40 mg by mouth daily. (Patient not taking: Reported on 09/09/2022)     No current facility-administered medications for this visit.   Facility-Administered Medications Ordered in Other Visits  Medication Dose Route Frequency Provider Last Rate Last Admin   chlorhexidine (HIBICLENS) 4 % liquid 1 application  1 application  Topical Once Autumn Messing III, MD       chlorhexidine (HIBICLENS) 4 % liquid 1 application  1 application  Topical Once Jovita Kussmaul, MD       Allergies  Allergen Reactions   Jeanie Cooks Allergy] Other (See Comments)    Unknown reaction; "MD told me I was allergic"   Zoloft [Sertraline Hcl] Other (See Comments)    REACTION: Nightmares,  Grind teeth really bad   Latex Itching    Itch and blisters from contact   Amitriptyline     Hot, itching, tongue tingling, breakthrough bleeding.   Clindamycin Other (See Comments)   Clindamycin/Lincomycin Hives and Other (See Comments)    Stated that this causes her to get really horse and made her feel really hot   Other Other (See Comments)   Penicillins    Pentosan Polysulfate Sodium Swelling    Throat swelling   Sulfa Antibiotics Other (See Comments)   Topamax [Topiramate]     Says it makes her "constipation and she can't urinate normally"   Uribel [Meth-Hyo-M Bl-Na Phos-Ph Sal] Nausea Only   Uribel [Urelle] Swelling and Other (See Comments)    Patient stated this makes her stomach swell and makes her feel si   Lamotrigine Rash   Sulfamethoxazole-Trimethoprim Rash    Patient developed feelings of dizziness, rash and itchy hands.  Patient then vomited and had diarrhea.     Review of Systems: All systems reviewed and negative except where noted in HPI.    No results found.  Physical Exam: BP (!) 140/92   Pulse 98   Ht '5\' 4"'$  (1.626 m)   Wt 168 lb (76.2 kg)   BMI 28.84 kg/m  Constitutional: Pleasant,well-developed, Caucasian female in no acute distress. HEENT: Normocephalic and atraumatic. Conjunctivae are normal. No scleral icterus. Neck supple.  Cardiovascular: Normal rate, regular rhythm.  Pulmonary/chest: Effort normal and breath sounds normal. Coughing frequently.  Bilateral wheezing and harsh expiratory breath sounds. Abdominal: Soft, nondistended, nontender. Bowel sounds active throughout. There are no masses palpable. No hepatomegaly. Extremities: no edema Neurological: Alert and oriented to person place and time. Skin: Skin is warm and dry. No rashes noted. Psychiatric: Normal mood and affect. Behavior is normal.  CBC    Component Value  Date/Time   WBC 8.9 08/26/2022 1308   RBC 4.06 08/26/2022 1308   HGB 13.8 08/26/2022 1308   HCT 39.2 08/26/2022 1308    PLT 234 08/26/2022 1308   MCV 96.6 08/26/2022 1308   MCH 34.0 08/26/2022 1308   MCHC 35.2 08/26/2022 1308   RDW 12.9 08/26/2022 1308   LYMPHSABS 2.5 08/26/2022 1308   MONOABS 0.6 08/26/2022 1308   EOSABS 0.1 08/26/2022 1308   BASOSABS 0.0 08/26/2022 1308    CMP     Component Value Date/Time   NA 126 (L) 08/26/2022 1308   NA 133 (L) 11/10/2018 1014   K 3.0 (L) 08/26/2022 1308   CL 91 (L) 08/26/2022 1308   CO2 23 08/26/2022 1308   GLUCOSE 134 (H) 08/26/2022 1308   BUN 8 08/26/2022 1308   BUN 9 11/10/2018 1014   CREATININE 0.68 08/26/2022 1308   CALCIUM 8.8 (L) 08/26/2022 1308   PROT 7.1 08/26/2022 1308   PROT 6.7 11/10/2018 1014   ALBUMIN 4.1 08/26/2022 1308   ALBUMIN 4.7 11/10/2018 1014   AST 33 08/26/2022 1308   ALT 22 08/26/2022 1308   ALKPHOS 60 08/26/2022 1308   BILITOT 1.4 (H) 08/26/2022 1308   BILITOT 0.3 11/10/2018 1014   GFRNONAA >60 08/26/2022 1308   GFRAA 101 11/10/2018 1014     ASSESSMENT AND PLAN: 61 year old female with persistent loose stools with mucus which started after taking antibiotics following a tick bite.  Suspect her symptoms are related to the antibiotic and gut flora alterations, but C. difficile needs to be ruled out.  Will also check for inflammatory diarrhea with a fecal lactoferrin.  Given her heavy smoking history, pancreatic insufficiency also possible.  Will check fecal elastase.  I recommended she take Imodium as needed for when she is going out of the house or will not have reliable access to a bathroom.  If all of these studies are unremarkable and her symptoms or not improving, we could consider repeat colonoscopy to rule out microscopic colitis. With regards to her recurrent right upper quadrant abdominal pain, will get right upper quadrant ultrasound to exclude gallstones.  Advised that she avoid NSAIDs.  Continue over-the-counter Nexium for now. We will tentatively schedule the patient for a colonoscopy given her reported being  overdue for 2 years.  We will request endoscopy records from Dr. Liliane Channel office to confirm that she is indeed due for another colonoscopy and to obtain the details of her previous polyps. Because the patient has active COPD symptoms and abnormal breath sounds on exam, I recommend that we schedule her colonoscopy for a few months out when she says her symptoms are usually improved.  Epigastric pain - RUQUS  Colon cancer screening/History of colon polyps - Request records from Dr. Liliane Channel office - Colonoscopy (late February/early March in hopes her COPD is better controlled)  Diarrhea - C diff - Fecal lactoferrin - Fecal elastase - Imodium PRN  GERD - Continue Nexium OTC daily  Hyponatremia - Repeat BMP  The details, risks (including bleeding, perforation, infection, missed lesions, medication reactions and possible hospitalization or surgery if complications occur), benefits, and alternatives to colonoscopy with possible biopsy and possible polypectomy were discussed with the patient and she consents to proceed.   Nance Mccombs E. Candis Schatz, MD [facility]   CC:  Dian Queen, MD   We received the following records from Dr. Liliane Channel office  Colonoscopy September 07, 2001 Indication: Diarrhea, hematochezia Normal colon, patient recommended to repeat in 5 years (family history of  polyps)  EGD September 07, 2001 Indication: Dysphagia, reflux symptoms Esophagus, no esophagitis or stricture Dilation performed with Savary dilator to 17 mm, no heme or resistance  EGD June 10, 2003 Indication: Dysphagia, 5 days of melena Normal endoscopy Dilation performed with Savary dilator to 18 mm  Colonoscopy August 21, 2006 Indication family history of colorectal neoplasia Normal colonoscopy Recommended repeat 5 years  Colonoscopy June 28, 2010 Indication: Hemoccult positive stool 2 diminutive polyps in the rectosigmoid colon removed with forceps Pathology  consistent with prolapsed polyp, negative for dysplasia/adenoma  EGD October 04, 2010 Indication: Dysphagia Dilation performed with savory dilator to 18 mm, no resistance or heme  Colonoscopy April 20, 2015 Indication: 5-year surveillance Normal colonoscopy Recommended repeat in 5 years because of personal history of adenoma  EGD April 20, 2015 Indication: Dysphagia  Normal EGD Dilation performed with Savary dilator to 18 mm, no resistance or heme  Liver biopsy January 12, 2002 Pathology showed findings consistent with chronic minimally active hepatitis C, inflammatory grade 1 and fibrosis stage 0

## 2022-10-16 NOTE — Patient Instructions (Signed)
_______________________________________________________  If you are age 60 or older, your body mass index should be between 23-30. Your Body mass index is 28.84 kg/m. If this is out of the aforementioned range listed, please consider follow up with your Primary Care Provider.  If you are age 68 or younger, your body mass index should be between 19-25. Your Body mass index is 28.84 kg/m. If this is out of the aformentioned range listed, please consider follow up with your Primary Care Provider.   ________________________________________________________  The [facility] providers would like to encourage you to use [facility] to communicate with providers for non-urgent requests or questions.  Due to long hold times on the telephone, sending your provider a message by [facility] may be a faster and more efficient way to get a response.  Please allow 48 business hours for a response.  Please remember that this is for non-urgent requests.  _______________________________________________________  Sarah Reeves have been scheduled for an abdominal ultrasound at [facility] (1st floor of hospital) on Thursday 10/24/22 at 8:30 am. Please arrive 30 minutes prior to your appointment for registration. Make certain not to have anything to eat or drink 6 hours prior to your appointment. Should you need to reschedule your appointment, please contact radiology at [phone]. This test typically takes about 30 minutes to perform.  _______________________________________________________  Your provider has requested that you go to the basement level for lab work before leaving today. Press "B" on the elevator. The lab is located at the first door on the left as you exit the elevator.

## 2022-10-17 ENCOUNTER — Encounter: Payer: Self-pay | Admitting: Adult Health

## 2022-10-17 ENCOUNTER — Telehealth: Payer: Self-pay

## 2022-10-17 NOTE — Telephone Encounter (Addendum)
Called patient regarding results. Patient had understanding of results.----- Message from Lendon Colonel, NP sent at 10/17/2022 11:36 AM EST ----- I have reviewed her labs. She was placed on potassium 10 mEq daily and the BMET was to check the level after one month. Her potassium is in normal range. Continue potassium 10 mEq daily as directed.   KL

## 2022-10-22 ENCOUNTER — Other Ambulatory Visit: Payer: BC Managed Care – PPO

## 2022-10-22 DIAGNOSIS — R1010 Upper abdominal pain, unspecified: Secondary | ICD-10-CM

## 2022-10-22 DIAGNOSIS — Z86010 Personal history of colonic polyps: Secondary | ICD-10-CM | POA: Diagnosis not present

## 2022-10-22 DIAGNOSIS — K219 Gastro-esophageal reflux disease without esophagitis: Secondary | ICD-10-CM

## 2022-10-22 DIAGNOSIS — R197 Diarrhea, unspecified: Secondary | ICD-10-CM

## 2022-10-24 ENCOUNTER — Ambulatory Visit (HOSPITAL_COMMUNITY)
Admission: RE | Admit: 2022-10-24 | Discharge: 2022-10-24 | Disposition: A | Discharge status: Home / Self Care | Payer: BC Managed Care – PPO | Source: Ambulatory Visit | Attending: Gastroenterology | Admitting: Gastroenterology

## 2022-10-24 DIAGNOSIS — Z86010 Personal history of colonic polyps: Secondary | ICD-10-CM | POA: Diagnosis not present

## 2022-10-24 DIAGNOSIS — R1010 Upper abdominal pain, unspecified: Secondary | ICD-10-CM | POA: Diagnosis not present

## 2022-10-24 DIAGNOSIS — R197 Diarrhea, unspecified: Secondary | ICD-10-CM | POA: Diagnosis not present

## 2022-10-24 DIAGNOSIS — R109 Unspecified abdominal pain: Secondary | ICD-10-CM | POA: Diagnosis not present

## 2022-10-24 DIAGNOSIS — K219 Gastro-esophageal reflux disease without esophagitis: Secondary | ICD-10-CM | POA: Diagnosis not present

## 2022-10-24 LAB — CLOSTRIDIUM DIFFICILE BY PCR: Toxigenic C. Difficile by PCR: POSITIVE — AB

## 2022-10-25 ENCOUNTER — Other Ambulatory Visit: Payer: Self-pay

## 2022-10-25 MED ORDER — VANCOMYCIN HCL 125 MG PO CAPS: 125.0000 mg | ORAL_CAPSULE | Freq: Four times a day (QID) | ORAL | 0 refills | Status: DC

## 2022-10-25 NOTE — Progress Notes (Signed)
Sarah Reeves,  Please contact [name] and let her know that her C diff test was positive.  This is the likely cause of her chronic diarrhea.  Will treat with vancomycin 125 mg PO q6hrs x 10 days #40 rf0. Her inflammatory markers were normal. Her potassium level was slightly low at our lab, but was normal when checked later through her PCP.

## 2022-10-29 ENCOUNTER — Telehealth: Payer: Self-pay | Admitting: Gastroenterology

## 2022-10-29 ENCOUNTER — Telehealth: Payer: Self-pay

## 2022-10-29 NOTE — Telephone Encounter (Signed)
Pt was prescribed vancomycin last week for c diff. She had some oral surgery last week and went to have them look at the implant area that she thought looked infected. The oral surgeon wanted to put her on amoxicillin, she told him she had cdiff and had been given vanco. He told her she needed to take the vanco because the "cdiff could kill her." She took the first vanco pill this am at 7:30am. She is calling stating that her lips and her tongue are tingling. Asked if she had any swelling and she states her face is swollen from the surgery. She states she has some benadryl and wanted to know if she needed to take an epi pen. She denies and SOB, just tingling. Suggested she go ahead and take '50mg'$  of benadryl while we find out what Dr. Candis Schatz recommends. Has only taken 1 dose. Pt is very concerned. Please advise.

## 2022-10-30 ENCOUNTER — Telehealth: Payer: Self-pay | Admitting: Gastroenterology

## 2022-10-30 DIAGNOSIS — A0472 Enterocolitis due to Clostridium difficile, not specified as recurrent: Secondary | ICD-10-CM | POA: Diagnosis not present

## 2022-10-30 DIAGNOSIS — R319 Hematuria, unspecified: Secondary | ICD-10-CM | POA: Diagnosis not present

## 2022-10-30 DIAGNOSIS — R1010 Upper abdominal pain, unspecified: Secondary | ICD-10-CM | POA: Diagnosis not present

## 2022-10-30 DIAGNOSIS — R309 Painful micturition, unspecified: Secondary | ICD-10-CM | POA: Diagnosis not present

## 2022-10-30 DIAGNOSIS — N390 Urinary tract infection, site not specified: Secondary | ICD-10-CM | POA: Diagnosis not present

## 2022-10-30 MED ORDER — FIDAXOMICIN 200 MG PO TABS: 200.0000 mg | ORAL_TABLET | Freq: Two times a day (BID) | ORAL | 0 refills | Status: DC

## 2022-10-30 NOTE — Telephone Encounter (Signed)
Spoke with pt and she is aware, script sent to pharmacy. 

## 2022-10-30 NOTE — Telephone Encounter (Signed)
Inbound call from pharmacy wanting to see if a prior authorization has been started for prescription due to cost. States patient is trying to purchase for [phone] as early as tomorrow but they wanted to check with provider first. Please advise.

## 2022-10-30 NOTE — Progress Notes (Signed)
Sarah Reeves,  Please let Sarah Reeves know that her ultrasound did not show any gallstones or biliary obstruction to explain her abdominal pain.  She does have fatty change to the liver, but this does not cause abdominal pain.  I recommend we address her C diff infection first and then reassess her symptoms once she has been treated.  We can follow up in the clinic to any ongoing symptoms following her C diff treatment and discuss fatty liver further.  Her liver enzymes have been normal.

## 2022-10-31 ENCOUNTER — Other Ambulatory Visit: Payer: Self-pay | Admitting: Obstetrics and Gynecology

## 2022-10-31 ENCOUNTER — Other Ambulatory Visit (HOSPITAL_COMMUNITY): Payer: Self-pay

## 2022-10-31 ENCOUNTER — Telehealth: Payer: Self-pay | Admitting: Pharmacy Technician

## 2022-10-31 DIAGNOSIS — R1010 Upper abdominal pain, unspecified: Secondary | ICD-10-CM

## 2022-10-31 LAB — FECAL LACTOFERRIN, QUANT
Fecal Lactoferrin: NEGATIVE
MICRO NUMBER:: 14376856
SPECIMEN QUALITY:: ADEQUATE

## 2022-10-31 LAB — PANCREATIC ELASTASE, FECAL: Pancreatic Elastase-1, Stool: >500 mcg/g

## 2022-10-31 NOTE — Telephone Encounter (Signed)
Patient called following up on medication, is requesting If there is any affordable option or if there is any way the price can be lowered. Please advise.

## 2022-10-31 NOTE — Telephone Encounter (Signed)
Patient Advocate Encounter  Prior Authorization for DIFICID '200MG'$  has been approved.    PA# --- Effective dates: 1.11.24 through 1.20.24   Received notification from [facility] that prior authorization for DIFICID '200MG'$  is required.   PA submitted on 1.11.24 Key [id number] Status is pending

## 2022-11-07 ENCOUNTER — Telehealth: Payer: Self-pay | Admitting: Gastroenterology

## 2022-11-07 NOTE — Telephone Encounter (Signed)
Pt states she had some blood in her urine before she started taking the dificid. States her pcp did culture but she has not gotten results back yet. She wants to know if it is ok for her to take a different antibiotic when she is taking the dificid. She took elmiron '100mg'$  this am and yesterday for the bladder spasms and wanted to make sure that was ok. Discussed with her we do not treat bladder infections, she states she just wanted to check to see if it was ok to take another antibiotic while on dificid and if elmiron was ok. Please advise.

## 2022-11-07 NOTE — Telephone Encounter (Signed)
Spoke with pt and she is aware of Dr. Dayle Points recommendations.

## 2022-11-07 NOTE — Telephone Encounter (Signed)
Inbound call from patient states she had a bladder and kidney infection before she was prescribed DIFICID. Patient states no provider would prescribe her an antibiotic because of this medication. Patient states she is uncertain of what to do. And is requesting to speak to a nurse.

## 2022-11-07 NOTE — Telephone Encounter (Signed)
she just wanted to check to see if it was ok to take another antibiotic while on dificid

## 2022-11-08 ENCOUNTER — Other Ambulatory Visit: Payer: Self-pay

## 2022-11-08 ENCOUNTER — Emergency Department (HOSPITAL_BASED_OUTPATIENT_CLINIC_OR_DEPARTMENT_OTHER)
Admission: EM | Admit: 2022-11-08 | Discharge: 2022-11-08 | Disposition: A | Discharge status: Home / Self Care | Payer: BC Managed Care – PPO | Attending: Emergency Medicine | Admitting: Emergency Medicine

## 2022-11-08 ENCOUNTER — Other Ambulatory Visit (HOSPITAL_BASED_OUTPATIENT_CLINIC_OR_DEPARTMENT_OTHER): Payer: Self-pay

## 2022-11-08 ENCOUNTER — Encounter (HOSPITAL_BASED_OUTPATIENT_CLINIC_OR_DEPARTMENT_OTHER): Payer: Self-pay | Admitting: Emergency Medicine

## 2022-11-08 DIAGNOSIS — N3090 Cystitis, unspecified without hematuria: Secondary | ICD-10-CM | POA: Insufficient documentation

## 2022-11-08 DIAGNOSIS — D72829 Elevated white blood cell count, unspecified: Secondary | ICD-10-CM | POA: Insufficient documentation

## 2022-11-08 DIAGNOSIS — E876 Hypokalemia: Secondary | ICD-10-CM | POA: Diagnosis not present

## 2022-11-08 DIAGNOSIS — R300 Dysuria: Secondary | ICD-10-CM | POA: Diagnosis not present

## 2022-11-08 HISTORY — DX: Other bacterial infections of unspecified site: A49.8

## 2022-11-08 LAB — BASIC METABOLIC PANEL
Anion gap: 11 (ref 5–15)
BUN: 10 mg/dL (ref 6–20)
CO2: 27 mmol/L (ref 22–32)
Calcium: 9.1 mg/dL (ref 8.9–10.3)
Chloride: 92 mmol/L — ABNORMAL LOW (ref 98–111)
Creatinine, Ser: 0.64 mg/dL (ref 0.44–1.00)
GFR, Estimated: >60 mL/min (ref >60)
Glucose, Bld: 113 mg/dL — ABNORMAL HIGH (ref 70–99)
Potassium: 3 mmol/L — ABNORMAL LOW (ref 3.5–5.1)
Sodium: 130 mmol/L — ABNORMAL LOW (ref 135–145)

## 2022-11-08 LAB — URINALYSIS, MICROSCOPIC (REFLEX): WBC, UA: >50 WBC/hpf (ref 0–5)

## 2022-11-08 LAB — URINALYSIS, ROUTINE W REFLEX MICROSCOPIC
Bilirubin Urine: NEGATIVE
Glucose, UA: NEGATIVE mg/dL
Ketones, ur: NEGATIVE mg/dL
Leukocytes,Ua: NEGATIVE
Nitrite: NEGATIVE
Protein, ur: NEGATIVE mg/dL
Specific Gravity, Urine: 1.01 (ref 1.005–1.030)
pH: 7 (ref 5.0–8.0)

## 2022-11-08 LAB — CBC WITH DIFFERENTIAL/PLATELET
Abs Immature Granulocytes: 0.06 10*3/uL (ref 0.00–0.07)
Basophils Absolute: 0.1 10*3/uL (ref 0.0–0.1)
Basophils Relative: 0 %
Eosinophils Absolute: 0.1 10*3/uL (ref 0.0–0.5)
Eosinophils Relative: 1 %
HCT: 40.4 % (ref 36.0–46.0)
Hemoglobin: 14 g/dL (ref 12.0–15.0)
Immature Granulocytes: 1 %
Lymphocytes Relative: 23 %
Lymphs Abs: 3 10*3/uL (ref 0.7–4.0)
MCH: 32.9 pg (ref 26.0–34.0)
MCHC: 34.7 g/dL (ref 30.0–36.0)
MCV: 95.1 fL (ref 80.0–100.0)
Monocytes Absolute: 0.8 10*3/uL (ref 0.1–1.0)
Monocytes Relative: 6 %
Neutro Abs: 8.9 10*3/uL — ABNORMAL HIGH (ref 1.7–7.7)
Neutrophils Relative %: 69 %
Platelets: 206 10*3/uL (ref 150–400)
RBC: 4.25 MIL/uL (ref 3.87–5.11)
RDW: 12.6 % (ref 11.5–15.5)
WBC: 12.9 10*3/uL — ABNORMAL HIGH (ref 4.0–10.5)
nRBC: 0 % (ref 0.0–0.2)

## 2022-11-08 LAB — TSH: TSH: 2.047 u[IU]/mL (ref 0.350–4.500)

## 2022-11-08 MED ORDER — FLUCONAZOLE 200 MG PO TABS
200.0000 mg | ORAL_TABLET | Freq: Every day | ORAL | 0 refills | Status: AC
  Filled 2022-11-08: qty 1, 1d supply, fill #0

## 2022-11-08 MED ORDER — DIAZEPAM 5 MG PO TABS
5.0000 mg | ORAL_TABLET | Freq: Two times a day (BID) | ORAL | 0 refills | Status: DC
  Filled 2022-11-08: qty 10, 5d supply, fill #0

## 2022-11-08 MED ORDER — DIAZEPAM 5 MG/ML IJ SOLN
5.0000 mg | Freq: Once | INTRAMUSCULAR | Status: AC
  Administered 2022-11-08: 5 mg via INTRAVENOUS
  Filled 2022-11-08: qty 2

## 2022-11-08 MED ORDER — CEPHALEXIN 500 MG PO CAPS
500.0000 mg | ORAL_CAPSULE | Freq: Three times a day (TID) | ORAL | 0 refills | Status: DC
  Filled 2022-11-08: qty 15, 5d supply, fill #0

## 2022-11-08 MED ORDER — CEPHALEXIN 250 MG PO CAPS
500.0000 mg | ORAL_CAPSULE | Freq: Once | ORAL | Status: AC
  Administered 2022-11-08: 500 mg via ORAL
  Filled 2022-11-08: qty 2

## 2022-11-08 MED ORDER — ACETAMINOPHEN 500 MG PO TABS
1000.0000 mg | ORAL_TABLET | Freq: Once | ORAL | Status: AC
  Administered 2022-11-08: 1000 mg via ORAL
  Filled 2022-11-08: qty 2

## 2022-11-08 MED ORDER — POTASSIUM CHLORIDE 20 MEQ PO PACK
20.0000 meq | PACK | Freq: Once | ORAL | Status: AC
  Administered 2022-11-08: 20 meq via ORAL
  Filled 2022-11-08: qty 1

## 2022-11-08 NOTE — ED Notes (Signed)
ED Provider at bedside. 

## 2022-11-08 NOTE — ED Triage Notes (Addendum)
Dysuria x 3 days.  Bladder pain radiating to back.  Pt currently being treated for cdiff.  Pt called provider and was instructed to be seen.

## 2022-11-08 NOTE — ED Provider Notes (Signed)
[facility] Provider Note   CSN: [id number] Arrival date & time: 11/08/22  0805     History Chief Complaint  Patient presents with   Dysuria    HPI Sarah Reeves is a 62 y.o. female presenting for chief complaint of dysuria.  States that she has had urinary discomfort, dripping incontinence, painful urination for 2 months.  She was told by her PCP she needed to follow-up with urologist but states she she has been lost to follow-up with her primary urologist and would like a new referral. States that she has a history of chronic infections, recurrent infections. Is currently being treated for C. difficile per her PCP and started having thick flecks of material in her toilet when she pees. 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  Patient's recorded medical, surgical, social, medication list and allergies were reviewed in the Snapshot window as part of the initial history.   Review of Systems   Review of Systems  Constitutional:  Negative for chills and fever.  HENT:  Negative for ear pain and sore throat.   Eyes:  Negative for pain and visual disturbance.  Respiratory:  Negative for cough and shortness of breath.   Cardiovascular:  Negative for chest pain and palpitations.  Gastrointestinal:  Negative for abdominal pain and vomiting.  Genitourinary:  Positive for dysuria and frequency. Negative for dyspareunia and hematuria.  Musculoskeletal:  Negative for arthralgias and back pain.  Skin:  Negative for color change and rash.  Neurological:  Negative for seizures and syncope.  All other systems reviewed and are negative.   Physical Exam Updated Vital Signs BP (!) 145/79   Pulse 84   Temp 98.1 F (36.7 C) (Oral)   Resp 17   Ht '5\' 4"'$  (1.626 m)   Wt 75.8 kg   SpO2 95%   BMI 28.67 kg/m  Physical Exam Vitals and nursing note reviewed.  Constitutional:       General: She is not in acute distress.    Appearance: She is well-developed.  HENT:     Head: Normocephalic and atraumatic.  Eyes:     Conjunctiva/sclera: Conjunctivae normal.  Cardiovascular:     Rate and Rhythm: Normal rate and regular rhythm.     Heart sounds: No murmur heard. Pulmonary:     Effort: Pulmonary effort is normal. No respiratory distress.     Breath sounds: Normal breath sounds.  Abdominal:     General: There is distension.     Palpations: Abdomen is soft.     Tenderness: There is no abdominal tenderness. There is no right CVA tenderness or left CVA tenderness.  Musculoskeletal:        General: No swelling or tenderness. Normal range of motion.     Cervical back: Neck supple.  Skin:    General: Skin is warm and dry.  Neurological:     General: No focal deficit present.     Mental Status: She is alert and oriented to person, place, and time. Mental status is at baseline.     Cranial Nerves: No cranial nerve deficit.      ED Course/ Medical Decision Making/ A&P    Procedures Ultrasound ED Renal  Date/Time: 11/08/2022 10:25 AM  Performed by: Tretha Sciara, MD Authorized by: Tretha Sciara, MD   Procedure details:    Indications: urinary retention     Technique:  Bladder, L  kidney and R kidneyImages: archivedStudy Limitations: body habitus Left kidney findings:    Nephrolithiasis: not identified     Renal stones: not identified     Intra-abdominal fluid: not identified     Perinephric fluid: not identified     Hydronephrosis: none   Right kidney findings:    Nephrolithiasis: not identified     Renal stones: not identified     Intra-abdominal fluid: not identified     Perinephric fluid: not identified     Hydronephrosis: none   Bladder findings:    Bladder:  Visualized   Volume:  80   Post-void residual volume:  80    Medications Ordered in ED Medications  diazepam (VALIUM) injection 5 mg (5 mg Intravenous Given 11/08/22 0918)   acetaminophen (TYLENOL) tablet 1,000 mg (1,000 mg Oral Given 11/08/22 0919)  cephALEXin (KEFLEX) capsule 500 mg (500 mg Oral Given 11/08/22 1030)  potassium chloride (KLOR-CON) packet 20 mEq (20 mEq Oral Given 11/08/22 1030)    Medical Decision Making:    DINAH LUPA is a 62 y.o. female who presented to the ED today with a chief complaint of urinary discomfort and urinary frequency detailed above.     Patient's presentation is complicated by their history of multiple comorbid medical problems.  Patient placed on continuous vitals and telemetry monitoring while in ED which was reviewed periodically.   Complete initial physical exam performed, notably the patient  was stable in no acute distress.  She has some suprapubic tenderness and feels slightly distended.  Postvoid residual ultrasound was performed demonstrating 80 cc of urine.   Reviewed and confirmed nursing documentation for past medical history, family history, social history.    Initial Assessment:   With the patient's presentation of urinary discomfort and frequency, most likely diagnosis is urinary obstruction, urinary retention, urinary tract infection. Other diagnoses were considered including (but not limited to) pyelonephritis, nephrolithiasis, nephropathy. These are considered less likely due to history of present illness and physical exam findings.   This is most consistent with an acute life/limb threatening illness complicated by underlying chronic conditions.  Initial Plan:  ED point-of-care ultrasound performed as above demonstrates 80 cc of urine retention, negative for hydronephrosis, no obvious nephrolithiasis. Screening labs including CBC and Metabolic panel to evaluate for infectious or metabolic etiology of disease.  Urinalysis with reflex culture ordered to evaluate for UTI or relevant urologic/nephrologic pathology.  Objective evaluation as below reviewed with plan for close reassessment after administration of  symptomatic manage.  She has a history of symptomatic resolution with antispasmodic agents including Valium.  Initial Study Results:   Laboratory  All laboratory results reviewed without evidence of clinically relevant pathology.   Exceptions include: Bacteriuria, leukocytosis, mild hypokalemia   Final Assessment and Plan:   Patient's history of present illness and physical exam findings in the context of these objective results is most consistent with urinary tract infection given leukocytosis, bacteriuria, symptomatic description. After administration of Valium, patient had complete urinary voiding felt significantly improved.  She has a history of anaphylactic allergy to multiple antibiotics, will trial Keflex in the emergency department as well as potassium replacement She will need referral back to urology for further outpatient care and management.  No acute indication for Foley catheterization given improvement of voiding.  Will prescribe antispasmodic agent/Abx/ and antifungal therapy for day 3.   Disposition:  I have considered need for hospitalization, however, considering all of the above, I believe this patient is stable for discharge at this time.  Patient/family educated about specific return precautions for given chief complaint and symptoms.  Patient/family educated about follow-up with PCP/Urology/Gyn.     Patient/family expressed understanding of return precautions and need for follow-up. Patient spoken to regarding all imaging and laboratory results and appropriate follow up for these results. All education provided in verbal form with additional information in written form. Time was allowed for answering of patient questions. Patient discharged.    Emergency Department Medication Summary:   Medications  diazepam (VALIUM) injection 5 mg (5 mg Intravenous Given 11/08/22 0918)  acetaminophen (TYLENOL) tablet 1,000 mg (1,000 mg Oral Given 11/08/22 0919)  cephALEXin (KEFLEX)  capsule 500 mg (500 mg Oral Given 11/08/22 1030)  potassium chloride (KLOR-CON) packet 20 mEq (20 mEq Oral Given 11/08/22 1030)         Clinical Impression:  1. Cystitis      Discharge   Final Clinical Impression(s) / ED Diagnoses Final diagnoses:  Cystitis    Rx / DC Orders ED Discharge Orders          Ordered    cephALEXin (KEFLEX) 500 MG capsule  3 times daily        11/08/22 1150    diazepam (VALIUM) 5 MG tablet  2 times daily        11/08/22 1150              Tretha Sciara, MD 11/08/22 1542

## 2022-11-09 ENCOUNTER — Encounter (HOSPITAL_BASED_OUTPATIENT_CLINIC_OR_DEPARTMENT_OTHER): Payer: Self-pay | Admitting: Emergency Medicine

## 2022-11-09 ENCOUNTER — Other Ambulatory Visit: Payer: Self-pay

## 2022-11-09 ENCOUNTER — Emergency Department (HOSPITAL_BASED_OUTPATIENT_CLINIC_OR_DEPARTMENT_OTHER)
Admission: EM | Admit: 2022-11-09 | Discharge: 2022-11-09 | Disposition: A | Discharge status: Home / Self Care | Payer: BC Managed Care – PPO | Attending: Emergency Medicine | Admitting: Emergency Medicine

## 2022-11-09 DIAGNOSIS — Z7982 Long term (current) use of aspirin: Secondary | ICD-10-CM | POA: Diagnosis not present

## 2022-11-09 DIAGNOSIS — Z91040 Latex allergy status: Secondary | ICD-10-CM | POA: Diagnosis not present

## 2022-11-09 DIAGNOSIS — T7840XA Allergy, unspecified, initial encounter: Secondary | ICD-10-CM | POA: Diagnosis not present

## 2022-11-09 DIAGNOSIS — J449 Chronic obstructive pulmonary disease, unspecified: Secondary | ICD-10-CM | POA: Diagnosis not present

## 2022-11-09 DIAGNOSIS — J45909 Unspecified asthma, uncomplicated: Secondary | ICD-10-CM | POA: Diagnosis not present

## 2022-11-09 DIAGNOSIS — R21 Rash and other nonspecific skin eruption: Secondary | ICD-10-CM | POA: Diagnosis not present

## 2022-11-09 DIAGNOSIS — I10 Essential (primary) hypertension: Secondary | ICD-10-CM | POA: Diagnosis not present

## 2022-11-09 LAB — PTH, INTACT AND CALCIUM
Calcium, Total (PTH): 9.1 mg/dL (ref 8.7–10.3)
PTH: 15 pg/mL (ref 15–65)

## 2022-11-09 LAB — T3: T3, Total: 135 ng/dL (ref 71–180)

## 2022-11-09 MED ORDER — PREDNISONE 20 MG PO TABS
40.0000 mg | ORAL_TABLET | Freq: Once | ORAL | Status: AC
  Administered 2022-11-09: 40 mg via ORAL
  Filled 2022-11-09: qty 2

## 2022-11-09 MED ORDER — NITROFURANTOIN MONOHYD MACRO 100 MG PO CAPS: 100.0000 mg | ORAL_CAPSULE | Freq: Two times a day (BID) | ORAL | 0 refills | Status: AC

## 2022-11-09 MED ORDER — FAMOTIDINE 20 MG PO TABS
20.0000 mg | ORAL_TABLET | Freq: Once | ORAL | Status: AC
  Administered 2022-11-09: 20 mg via ORAL
  Filled 2022-11-09: qty 1

## 2022-11-09 MED ORDER — NITROFURANTOIN MONOHYD MACRO 100 MG PO CAPS
100.0000 mg | ORAL_CAPSULE | Freq: Once | ORAL | Status: AC
  Administered 2022-11-09: 100 mg via ORAL
  Filled 2022-11-09: qty 1

## 2022-11-09 NOTE — ED Provider Notes (Signed)
[facility] Provider Note   CSN: [id number] Arrival date & time: 11/09/22  5643     History  Chief Complaint  Patient presents with   Allergic Reaction    Sarah Reeves is a 62 y.o. female with asthma, GERD, EtOH use, hepatitis C, tobacco abuse, GAD, MDD, who presents with concern for allergic reaction.  Per chart review patient was seen here at [facility] yesterday for dysuria.  She was currently being treated for C. difficile by her PCP and is also having a flare of HSV for which she is taking Valtrex.  Was diagnosed yesterday with acute cystitis and prescribed Keflex 500 3 times daily.  Was also prescribed Valium for antispasmodic effects and discharged with PCP/urology/Gyn follow-up.  Patient with the Keflex last night and noted that she began to have some red rash on her arms and feet and began to itch.  She took a Benadryl last night which helped some and she was able to go to sleep.  She then woke up this morning feeling like her tongue was itching in her throat had something in it that she felt while she was swallowing.  Took 50 mg p.o. Benadryl which helped some but she continued to have symptoms so presented to the emergency department.  Endorses nausea as well and has a sensation of shortness of breath.  Denies any lightheadedness or syncope, tongue swelling.  Patient has an extensive allergy list.  She also is continue to have UTI symptoms same as yesterday.  Patient states she does have an EpiPen at home but is never had to use it before.  Patient has had no other new exposures, new soaps or foods.  No other new medications.  HPI     Home Medications Prior to Admission medications   Medication Sig Start Date End Date Taking? Authorizing Provider  nitrofurantoin, macrocrystal-monohydrate, (MACROBID) 100 MG capsule Take 1 capsule (100 mg total) by mouth 2 (two) times daily for 5 days. 11/09/22 11/14/22 Yes Audley Hose, MD  Ascorbic Acid (VITAMIN C) 1000 MG tablet Take 1,000 mg by mouth daily.    [provider]  aspirin EC 81 MG tablet Take 1 tablet (81 mg total) by mouth daily. 08/04/18   Leonie Man, MD  BREZTRI AEROSPHERE 160-9-4.8 MCG/ACT AERO Inhale 2 puffs into the lungs in the morning and at bedtime. 09/09/22   Dara Hoyer, FNP  cetirizine (ZYRTEC) 10 MG tablet Take 1 tablet (10 mg total) by mouth daily. 09/09/22   Ambs, Kathrine Cords, FNP  chlorpheniramine-HYDROcodone (TUSSIONEX) 10-8 MG/5ML SUER     [provider]  cholecalciferol (VITAMIN D) 1000 UNITS tablet Take 2,000 Units by mouth daily.     [provider]  cyclobenzaprine (FLEXERIL) 10 MG tablet Take 10 mg by mouth 3 (three) times daily as needed for muscle spasms.    [provider]  Dextromethorphan-guaiFENesin (TUSSIN DM) 10-100 MG/5ML liquid Take 5 mLs by mouth every 12 (twelve) hours. Patient not taking: Reported on 09/09/2022 08/09/21   Suella Broad A, PA-C  diazepam (VALIUM) 5 MG tablet Take 1 tablet (5 mg total) by mouth 2 (two) times daily. 11/08/22   Tretha Sciara, MD  estradiol (VIVELLE-DOT) 0.075 MG/24HR Place onto the skin. 07/30/21   [provider]  fidaxomicin (DIFICID) 200 MG TABS tablet Take 1 tablet (200 mg total) by mouth 2 (two) times daily. 10/30/22   Daryel November, MD  fluconazole (DIFLUCAN)  200 MG tablet Take 1 tablet (200 mg total) by mouth daily for 1 day. 11/08/22 11/09/22  Tretha Sciara, MD  FLUoxetine (PROZAC) 40 MG capsule Take 40 mg by mouth daily. 05/09/22   [provider]  gabapentin (NEURONTIN) 300 MG capsule Take 300 mg by mouth daily as needed (nerve pain). 11/29/19   [provider]  hydrochlorothiazide (HYDRODIURIL) 50 MG tablet Take 50 mg by mouth daily. 05/09/22   [provider]  levalbuterol (XOPENEX HFA) 45 MCG/ACT inhaler INHALE 2 PUFFS INTO THE LUNGS EVERY 4 HOURS AS NEEDED FOR WHEEZING 09/09/22   Ambs, Kathrine Cords, FNP   levalbuterol (XOPENEX) 1.25 MG/3ML nebulizer solution Take 1.25 mg by nebulization every 4 (four) hours as needed for wheezing. 10/18/21   Dara Hoyer, FNP  levofloxacin (LEVAQUIN) 500 MG tablet Take 1 tablet (500 mg total) by mouth daily. Patient not taking: Reported on 09/09/2022 08/26/22   Milton Ferguson, MD  LORazepam (ATIVAN) 2 MG tablet Take 2 mg by mouth in the morning, at noon, and at bedtime. 12/17/19   [provider]  meclizine (ANTIVERT) 25 MG tablet Take 25 mg by mouth 3 (three) times daily as needed for dizziness.    [provider]  montelukast (SINGULAIR) 10 MG tablet Take 1 tablet (10 mg total) by mouth at bedtime. 09/09/22   Ambs, Kathrine Cords, FNP  nitroGLYCERIN (NITROSTAT) 0.4 MG SL tablet Place 1 tablet (0.4 mg total) under the tongue every 5 (five) minutes as needed for chest pain. 08/30/22 11/28/22  Lendon Colonel, NP  nystatin (MYCOSTATIN) 100000 UNIT/ML suspension Take 5 mL swish and swallow by mouth 4 times a day for 5 days Patient not taking: Reported on 10/16/2022 05/07/22   Roney Marion, MD  pantoprazole (PROTONIX) 40 MG tablet Take 1 tablet (40 mg total) by mouth daily. 04/19/22   Roney Marion, MD  potassium chloride (KLOR-CON) 10 MEQ tablet Take 1 tablet (10 mEq total) by mouth daily. 08/30/22 11/28/22  Lendon Colonel, NP  progesterone (PROMETRIUM) 100 MG capsule Take 200 mg by mouth at bedtime. 12/15/20   [provider]  valACYclovir (VALTREX) 500 MG tablet Take 500 mg by mouth daily as needed (outbreaks). 11/16/19   [provider]  ziprasidone (GEODON) 40 MG capsule Take 40 mg by mouth daily. Patient not taking: Reported on 09/09/2022    [provider]  zolpidem (AMBIEN) 10 MG tablet Take 10 mg by mouth at bedtime. 03/20/21   [provider]      Allergies    Keflex [cephalexin], Oysters [shellfish allergy], Vancomycin, Zoloft [sertraline hcl], Latex, Amitriptyline, Clindamycin, Clindamycin/lincomycin,  Other, Penicillins, Pentosan polysulfate sodium, Sulfa antibiotics, Topamax [topiramate], Uribel [meth-hyo-m bl-na phos-ph sal], Uribel [urelle], Lamotrigine, and Sulfamethoxazole-trimethoprim    Review of Systems   Review of Systems Review of systems Negative for f/c.  A 10 point review of systems was performed and is negative unless otherwise reported in HPI.  Physical Exam Updated Vital Signs BP 136/81   Pulse 87   Temp 97.8 F (36.6 C) (Oral)   Resp 16   Wt 75.8 kg   SpO2 100%   BMI 28.67 kg/m  Physical Exam General: Normal appearing female, lying in bed.  HEENT: PERRLA, Sclera anicteric, MMM, trachea midline.  Cardiology: RRR, no murmurs/rubs/gallops. BL radial and DP pulses equal bilaterally.  Resp: Normal respiratory rate and effort. CTAB, no wheezes, rhonchi, crackles.  Abd: Soft, non-tender, non-distended. No rebound tenderness or guarding.  GU: Deferred. MSK: No peripheral edema or  signs of trauma. Extremities without deformity or TTP. No cyanosis or clubbing. Skin: warm, dry. No rashes or lesions. Back: No CVA tenderness Neuro: A&Ox4, CNs II-XII grossly intact. MAEs. Sensation grossly intact.  Psych: Normal mood and affect.   ED Results / Procedures / Treatments   Labs (all labs ordered are listed, but only abnormal results are displayed) Labs Reviewed  URINE CULTURE    EKG None  Radiology No results found.  Procedures Procedures    Medications Ordered in ED Medications  predniSONE (DELTASONE) tablet 40 mg (40 mg Oral Given 11/09/22 0948)  famotidine (PEPCID) tablet 20 mg (20 mg Oral Given 11/09/22 0948)  nitrofurantoin (macrocrystal-monohydrate) (MACROBID) capsule 100 mg (100 mg Oral Given 11/09/22 1019)    ED Course/ Medical Decision Making/ A&P                          Medical Decision Making Amount and/or Complexity of Data Reviewed Labs: ordered.  Risk Prescription drug management.    This patient presents to the ED for concern of  allergic reaction, this involves an extensive number of treatment options, and is a complaint that carries with it a high risk of complications and morbidity.  I considered the following differential and admission for this acute, potentially life threatening condition.   MDM:    Patient presents with concern for an allergic reaction to Keflex which was prescribed yesterday.  Her exam does not demonstrate any wheezing, hypotension, or lightheadedness.  She does have areas of faint erythema on her medial arms that likely and remnants of hives after her Benadryl dose she took this morning and with the tingling of the tongue and throat, patient is likely having allergic reaction.  Low concern at this time for anaphylaxis or need for epinephrine. Patient already took benadryl today and will give famotidine/prednisone and monitor.  She does not feel short of breath, no fevers chills or chest pain, need for imaging such as chest x-ray.  Will discuss with pharmacy given patient's extensive allergy list appropriate treatment for her UTI.  Clinical Course as of 11/09/22 1130  Sat Nov 09, 2022  1009 Pt with no culture data from here or care everywhere. She did not have a urine culture taken yesterday. Did have one day of keflex but will get urine culture today to evaluate though it may be negative. Called pharmacist to discuss who recommended macrobid for her UTI. Urine culture will be in process.  [HN]    Clinical Course User Index [HN] Audley Hose, MD    Additional history obtained from chart review.   Cardiac Monitoring: The patient was maintained on a cardiac monitor.  I personally viewed and interpreted the cardiac monitored which showed an underlying rhythm of: Normal sinus rhythm  Reevaluation: After the interventions noted above, I reevaluated the patient and found that they have :improved  Social Determinants of Health: Patient lives independently  Disposition: Patient improved after  prednisone and famotidine here as well as Benadryl given at home.  She has been observed for greater than 2 hours here in the emergency department without any recurrence of her symptoms or worsening.  Patient is stable to follow-up outpatient.  Patient is advised to avoid Keflex in the future as well as other medications in the cephalosporin family.  Keflex was added to her allergy list.  Patient already has an EpiPen at home.  Patient is advised to follow-up with her primary care physician within 1 to  2 weeks.  Urine culture was drawn and patient be notified if results resistant to [place].  Discharged with discharge instructions and return precautions.  Co morbidities that complicate the patient evaluation  Past Medical History:  Diagnosis Date   Angio-edema    Anxiety    Arthritis    Asthma    Clostridioides difficile infection    Complication of anesthesia    takes alot to sedated   COPD (chronic obstructive pulmonary disease) (HCC)    Depression    Fibromyalgia    GERD (gastroesophageal reflux disease)    Hepatitis C    (Dr. Earlean Shawl) Treated with Harvoni March-May 2016   Herpes simplex    Hyperlipemia    Insomnia    Mental disorder    Neuropathy    PONV (postoperative nausea and vomiting)    Sleep apnea    Urticaria      Medicines Meds ordered this encounter  Medications   predniSONE (DELTASONE) tablet 40 mg   famotidine (PEPCID) tablet 20 mg   nitrofurantoin (macrocrystal-monohydrate) (MACROBID) capsule 100 mg   nitrofurantoin, macrocrystal-monohydrate, (MACROBID) 100 MG capsule    Sig: Take 1 capsule (100 mg total) by mouth 2 (two) times daily for 5 days.    Dispense:  10 capsule    Refill:  0    I have reviewed the patients home medicines and have made adjustments as needed  Problem List / ED Course: Problem List Items Addressed This Visit   None Visit Diagnoses     Allergic reaction, initial encounter    -  Primary                   This note was  created using dictation software, which may contain spelling or grammatical errors.    Audley Hose, MD 11/09/22 1130

## 2022-11-09 NOTE — Discharge Instructions (Addendum)
Thank you for coming to [facility]. You were seen for rash, itching, swelling. We did an exam, labs, and imaging, and these showed likely an allergic reaction to Keflex.  We treated for an allergic reaction and monitored you in the ED for 2 hours without any worsening of your symptoms.  We discussed with pharmacy and we will treat with Macrobid twice per day for 5 days for UTI. We have drawn a urine culture as well. Keflex has been added to your allergy list; please discontinue taking it.  Please follow up with your primary care provider within 1-2 weeks.   Do not hesitate to return to the ED or call 911 if you experience: -Worsening symptoms -Chest pain, shortness of breath, wheezing -Throat swelling, facial swelling, difficulty breathing -Lightheadedness, passing out -Fevers/chills -Anything else that concerns you

## 2022-11-09 NOTE — ED Triage Notes (Signed)
Pt arrives pov, steady gait, endorses concern for allergic reaction to kflex. Pt endorses vaginal rash. Pt speaking in complete sentences. Reports throat felt tight last night. Reports eating breakfast with no problems. Pt reports taking valium pta. Was tx by EMS pta

## 2022-11-09 NOTE — ED Notes (Signed)
ED Provider at bedside. 

## 2022-11-10 LAB — URINE CULTURE: Culture: NO GROWTH

## 2022-11-13 ENCOUNTER — Telehealth: Payer: Self-pay | Admitting: Family Medicine

## 2022-11-13 ENCOUNTER — Encounter: Payer: Self-pay | Admitting: Family Medicine

## 2022-11-13 ENCOUNTER — Ambulatory Visit (INDEPENDENT_AMBULATORY_CARE_PROVIDER_SITE_OTHER): Payer: BC Managed Care – PPO | Admitting: Family Medicine

## 2022-11-13 ENCOUNTER — Telehealth: Payer: Self-pay | Admitting: Gastroenterology

## 2022-11-13 ENCOUNTER — Other Ambulatory Visit: Payer: Self-pay

## 2022-11-13 DIAGNOSIS — K219 Gastro-esophageal reflux disease without esophagitis: Secondary | ICD-10-CM | POA: Diagnosis not present

## 2022-11-13 DIAGNOSIS — J4489 Other specified chronic obstructive pulmonary disease: Secondary | ICD-10-CM

## 2022-11-13 DIAGNOSIS — F17210 Nicotine dependence, cigarettes, uncomplicated: Secondary | ICD-10-CM | POA: Diagnosis not present

## 2022-11-13 DIAGNOSIS — Z720 Tobacco use: Secondary | ICD-10-CM

## 2022-11-13 DIAGNOSIS — R197 Diarrhea, unspecified: Secondary | ICD-10-CM

## 2022-11-13 DIAGNOSIS — N319 Neuromuscular dysfunction of bladder, unspecified: Secondary | ICD-10-CM

## 2022-11-13 DIAGNOSIS — J310 Chronic rhinitis: Secondary | ICD-10-CM | POA: Diagnosis not present

## 2022-11-13 MED ORDER — BUDESONIDE-FORMOTEROL FUMARATE 160-4.5 MCG/ACT IN AERO: 2.0000 | INHALATION_SPRAY | Freq: Two times a day (BID) | RESPIRATORY_TRACT | 5 refills | Status: DC

## 2022-11-13 NOTE — Telephone Encounter (Signed)
Discussed with pt that we do not retest for cdiff unless the diarrhea does not go away or comes back. Pt verbalized understanding and knows to call back with any other issues.

## 2022-11-13 NOTE — Telephone Encounter (Signed)
Patient called and would like to talk with Webb Silversmith about the [employer] and [employer] , Trelegy, she said that they are causing her bladder problems.she is on a anitbi now.  [facility] [address] [phone]

## 2022-11-13 NOTE — Telephone Encounter (Signed)
Inbound called, patient would like to know when she should come in for another appointment to re-test for C-Diff.

## 2022-11-13 NOTE — Progress Notes (Signed)
RE: YANELIS OSIKA MRN: [id number] DOB: 1961-02-02 Date of Telemedicine Visit: 11/13/2022  Referring provider: Dian Queen, MD Primary care provider: Dian Queen, MD  Chief Complaint: Other (MEDICATION QUESTION)   Telemedicine Follow Up Visit via Telephone: I connected with Vance Belcourt for a follow up on 11/13/22 by telephone and verified that I am speaking with the correct person using two identifiers.   I discussed the limitations, risks, security and privacy concerns of performing an evaluation and management service by telephone and the availability of in person appointments. I also discussed with the patient that there may be a patient responsible charge related to this service. The patient expressed understanding and agreed to proceed.  Patient is at home   Provider is at the office.  Visit start time: 155 Visit end time: [place] consent/check in by: [facility] consent and medical assistant/nurse: Angela  History of Present Illness: She is a 62 y.o. female, who is being followed for asthma, chronic rhinitis, reflux, and tobacco use. Her previous allergy office visit was on 09/09/2022 with Gareth Morgan, [place].  In the interim, she reports that she has had many medical conditions requiring antibiotics and has recently been diagnosed with C. difficile.  She reports that over the last several years she has found it increasingly difficult to urinate and is interested in changing to an inhaler that does not have a LAMA component.  She reports urinary symptoms occurring intermittently over the last several years for which she has previously seen a urologist.  She reports that she has an appointment with a new urologist at [facility] in 1 week.  Asthma is reported as moderately well-controlled with occasional cough occurring in the morning which produces thick yellow phlegm.  She denies shortness of breath and wheeze with activity or rest.  She continues montelukast daily.   She has recently decreased Breztri to only as needed and supplemented this with levalbuterol for asthma control as she believes the LAMA component of Judithann Sauger is contributing to bladder symptoms.  She has previously used Symbicort with moderate relief of asthma symptoms.  Allergic rhinitis is reported as moderately well-controlled with symptoms including occasional clear rhinorrhea, nasal congestion, and sneezing in the morning.  She continues cetirizine 10 mg once a day and is not currently using nasal saline rinses or nasal steroid spray.  Reflux is reported as much better than at her last visit with no vomiting or heartburn.  She is not currently taking omeprazole or famotidine.  She continues to smoke about 12-20 cigarettes a day and is currently trying to cut down smoking.  She continues to experience sleep disordered breathing and has an upcoming appointment for a home sleep study with her pulmonology specialist.   Assessment and Plan: Teruko is a 62 y.o. female with: Patient Instructions  Severe persistent asthma/COPD  Continue montelukast 10 mg once a day to prevent cough or wheeze Stop Breztri at this time.  Begin Symbicort 160-2 puffs twice a day with a spacer to prevent cough or wheeze. Rinse your mouth and spit after each use. Call the clinic if you experience thrush.   Continue levalbuterol 2 puffs every 4 hours as needed for cough or wheeze OR Instead use albuterol 0.083% solution via nebulizer one unit vial every 4 hours as needed for cough or wheeze You may use levalbuterol 2 puffs 5-15 minutes before activity to decrease cough or wheeze Call the clinic to discuss restarting Tezspire if interested  Chronic rhinitis Continue cetirizine 10 mg once  a day as needed for runny nose or itch Continue Flonase 2 sprays in each nostril once a day as needed for stuffy nose.  In the right nostril, point the applicator out toward the right ear. In the left nostril, point the applicator out  toward the left ear Continue saline nasal rinses as needed for nasal symptoms. Use this before any medicated nasal sprays for best result  Reflux  Begin omeprazole 40 mg once a day. Take this medication about 30 minutes before your first meal. This will replace Protonix that was not covered well by insurance Begin famotidine 40 mg once at bedtime for better control of reflux Continue dietary and lifestyle modifications as listed below  Tobacco use Try to cut down and quit smoking Call the clinic if this treatment plan is not working well for you  Follow up in the clinic in 2 weeks or sooner if needed  Return in about 2 weeks (around 11/27/2022), or if symptoms worsen or fail to improve.  Meds ordered this encounter  Medications   budesonide-formoterol (SYMBICORT) 160-4.5 MCG/ACT inhaler    Sig: Inhale 2 puffs into the lungs 2 (two) times daily.    Dispense:  1 each    Refill:  5    Medication List:  Current Outpatient Medications  Medication Sig Dispense Refill   Ascorbic Acid (VITAMIN C) 1000 MG tablet Take 1,000 mg by mouth daily.     aspirin EC 81 MG tablet Take 1 tablet (81 mg total) by mouth daily. 90 tablet 3   budesonide-formoterol (SYMBICORT) 160-4.5 MCG/ACT inhaler Inhale 2 puffs into the lungs 2 (two) times daily. 1 each 5   cetirizine (ZYRTEC) 10 MG tablet Take 1 tablet (10 mg total) by mouth daily. 30 tablet 5   chlorpheniramine-HYDROcodone (TUSSIONEX) 10-8 MG/5ML SUER      cholecalciferol (VITAMIN D) 1000 UNITS tablet Take 2,000 Units by mouth daily.      cyclobenzaprine (FLEXERIL) 10 MG tablet Take 10 mg by mouth 3 (three) times daily as needed for muscle spasms.     Dextromethorphan-guaiFENesin (TUSSIN DM) 10-100 MG/5ML liquid Take 5 mLs by mouth every 12 (twelve) hours. 200 mL 0   diazepam (VALIUM) 5 MG tablet Take 1 tablet (5 mg total) by mouth 2 (two) times daily. 10 tablet 0   estradiol (VIVELLE-DOT) 0.075 MG/24HR Place onto the skin.     fidaxomicin (DIFICID) 200  MG TABS tablet Take 1 tablet (200 mg total) by mouth 2 (two) times daily. 20 tablet 0   gabapentin (NEURONTIN) 300 MG capsule Take 300 mg by mouth daily as needed (nerve pain).     hydrochlorothiazide (HYDRODIURIL) 50 MG tablet Take 50 mg by mouth daily.     levalbuterol (XOPENEX HFA) 45 MCG/ACT inhaler INHALE 2 PUFFS INTO THE LUNGS EVERY 4 HOURS AS NEEDED FOR WHEEZING 15 g 0   levalbuterol (XOPENEX) 1.25 MG/3ML nebulizer solution Take 1.25 mg by nebulization every 4 (four) hours as needed for wheezing. 72 mL 5   levofloxacin (LEVAQUIN) 500 MG tablet Take 1 tablet (500 mg total) by mouth daily. 7 tablet 0   LORazepam (ATIVAN) 2 MG tablet Take 2 mg by mouth in the morning, at noon, and at bedtime.     meclizine (ANTIVERT) 25 MG tablet Take 25 mg by mouth 3 (three) times daily as needed for dizziness.     montelukast (SINGULAIR) 10 MG tablet Take 1 tablet (10 mg total) by mouth at bedtime. 30 tablet 5   nitrofurantoin, macrocrystal-monohydrate, (MACROBID)  100 MG capsule Take 1 capsule (100 mg total) by mouth 2 (two) times daily for 5 days. 10 capsule 0   nitroGLYCERIN (NITROSTAT) 0.4 MG SL tablet Place 1 tablet (0.4 mg total) under the tongue every 5 (five) minutes as needed for chest pain. 25 tablet 2   nystatin (MYCOSTATIN) 100000 UNIT/ML suspension Take 5 mL swish and swallow by mouth 4 times a day for 5 days 60 mL 0   pantoprazole (PROTONIX) 40 MG tablet Take 1 tablet (40 mg total) by mouth daily. 90 tablet 1   potassium chloride (KLOR-CON) 10 MEQ tablet Take 1 tablet (10 mEq total) by mouth daily. 30 tablet 3   progesterone (PROMETRIUM) 100 MG capsule Take 200 mg by mouth at bedtime.     valACYclovir (VALTREX) 500 MG tablet Take 500 mg by mouth daily as needed (outbreaks).     ziprasidone (GEODON) 40 MG capsule Take 40 mg by mouth daily.     zolpidem (AMBIEN) 10 MG tablet Take 10 mg by mouth at bedtime.     FLUoxetine (PROZAC) 40 MG capsule Take 40 mg by mouth daily. (Patient not taking:  Reported on 11/13/2022)     No current facility-administered medications for this visit.   Facility-Administered Medications Ordered in Other Visits  Medication Dose Route Frequency Provider Last Rate Last Admin   chlorhexidine (HIBICLENS) 4 % liquid 1 application  1 application  Topical Once Autumn Messing III, MD       chlorhexidine (HIBICLENS) 4 % liquid 1 application  1 application  Topical Once Jovita Kussmaul, MD       Allergies: Allergies  Allergen Reactions   Keflex [Cephalexin] Hives, Itching and Other (See Comments)    Tongue and throat itching/tingling   Oysters [Shellfish Allergy] Other (See Comments)    Unknown reaction; "MD told me I was allergic"   Vancomycin Anaphylaxis    Face and tongue swells   Zoloft [Sertraline Hcl] Other (See Comments)    REACTION: Nightmares, Grind teeth really bad   Latex Itching    Itch and blisters from contact   Amitriptyline     Hot, itching, tongue tingling, breakthrough bleeding.   Clindamycin Other (See Comments)   Clindamycin/Lincomycin Hives and Other (See Comments)    Stated that this causes her to get really horse and made her feel really hot   Other Other (See Comments)   Penicillins    Pentosan Polysulfate Sodium Swelling    Throat swelling   Sulfa Antibiotics Other (See Comments)   Topamax [Topiramate]     Says it makes her "constipation and she can't urinate normally"   Uribel [Meth-Hyo-M Bl-Na Phos-Ph Sal] Nausea Only   Uribel [Urelle] Swelling and Other (See Comments)    Patient stated this makes her stomach swell and makes her feel si   Lamotrigine Rash   Sulfamethoxazole-Trimethoprim Rash    Patient developed feelings of dizziness, rash and itchy hands.  Patient then vomited and had diarrhea.   I reviewed her past medical history, social history, family history, and environmental history and no significant changes have been reported from previous visit on 09/09/2022.   Objective: Physical Exam Not obtained as  encounter was done via telephone.   Previous notes and tests were reviewed.  I discussed the assessment and treatment plan with the patient. The patient was provided an opportunity to ask questions and all were answered. The patient agreed with the plan and demonstrated an understanding of the instructions.   The patient was advised  to call back or seek an in-person evaluation if the symptoms worsen or if the condition fails to improve as anticipated.  I provided 24 minutes of non-face-to-face time during this encounter.  It was my pleasure to participate in [place] care today. Please feel free to contact me with any questions or concerns.   Sincerely,  Gareth Morgan, FNP

## 2022-11-13 NOTE — Telephone Encounter (Signed)
Can you please put her in for a televisit? I have open appointments today or tomorrow. Thank you

## 2022-11-13 NOTE — Patient Instructions (Addendum)
Severe persistent asthma/COPD  Continue montelukast 10 mg once a day to prevent cough or wheeze Stop Breztri at this time.  Begin Symbicort 160-2 puffs twice a day with a spacer to prevent cough or wheeze. Rinse your mouth and spit after each use. Call the clinic if you experience thrush.   Continue levalbuterol 2 puffs every 4 hours as needed for cough or wheeze OR Instead use albuterol 0.083% solution via nebulizer one unit vial every 4 hours as needed for cough or wheeze You may use levalbuterol 2 puffs 5-15 minutes before activity to decrease cough or wheeze Call the clinic to discuss restarting Tezspire if interested  Chronic rhinitis Continue cetirizine 10 mg once a day as needed for runny nose or itch Continue Flonase 2 sprays in each nostril once a day as needed for stuffy nose.  In the right nostril, point the applicator out toward the right ear. In the left nostril, point the applicator out toward the left ear Continue saline nasal rinses as needed for nasal symptoms. Use this before any medicated nasal sprays for best result  Reflux  Begin omeprazole 40 mg once a day. Take this medication about 30 minutes before your first meal. This will replace Protonix that was not covered well by insurance Begin famotidine 40 mg once at bedtime for better control of reflux Continue dietary and lifestyle modifications as listed below  Tobacco use Try to cut down and quit smoking Call the clinic if this treatment plan is not working well for you  Follow up in the clinic in 2 weeks or sooner if needed

## 2022-11-14 ENCOUNTER — Ambulatory Visit: Payer: BC Managed Care – PPO | Admitting: Adult Health

## 2022-11-14 ENCOUNTER — Other Ambulatory Visit: Payer: Self-pay | Admitting: *Deleted

## 2022-11-14 LAB — PTH-RELATED PEPTIDE: PTH-related peptide: <2 pmol/L

## 2022-11-14 MED ORDER — SYMBICORT 160-4.5 MCG/ACT IN AERO: 2.0000 | INHALATION_SPRAY | Freq: Two times a day (BID) | RESPIRATORY_TRACT | 5 refills | Status: DC

## 2022-11-18 ENCOUNTER — Ambulatory Visit: Payer: BC Managed Care – PPO | Admitting: Adult Health

## 2022-11-19 ENCOUNTER — Other Ambulatory Visit: Payer: Self-pay | Admitting: *Deleted

## 2022-11-19 ENCOUNTER — Other Ambulatory Visit: Payer: BC Managed Care – PPO

## 2022-11-19 DIAGNOSIS — Z87891 Personal history of nicotine dependence: Secondary | ICD-10-CM

## 2022-11-19 DIAGNOSIS — Z122 Encounter for screening for malignant neoplasm of respiratory organs: Secondary | ICD-10-CM

## 2022-11-19 DIAGNOSIS — F17210 Nicotine dependence, cigarettes, uncomplicated: Secondary | ICD-10-CM

## 2022-11-20 NOTE — Telephone Encounter (Signed)
Inbound call from patient stating that she believes that she needs to be retested for C-diff. Patient stated she is having abd pain, diarrhea and mucus in stool. Patient is requesting a call back to discuss. Please advise.

## 2022-11-20 NOTE — Telephone Encounter (Signed)
Patient returned your call.

## 2022-11-20 NOTE — Telephone Encounter (Signed)
Left message for pt to call back  °

## 2022-11-20 NOTE — Progress Notes (Signed)
Cardiology Clinic Note   Patient Name: Sarah Reeves Date of Encounter: 11/22/2022  Primary Care Provider:  Dian Queen, MD Primary Cardiologist:  Glenetta Hew, MD  Patient Profile    62 year old female with chronic chest pain having had a low risk Myoview in 2016, hypertension, ongoing tobacco abuse, COPD, hepatitis and chronic anxiety.  She cares for her husband who has dementia.  Last seen in the office on 12/27/2019 via telemedicine during [place] pandemic.  She was doing well at that time and was using her elliptical 15 to 20 minutes without chest pain.  Occasionally used nitroglycerin and aspirin if she did have recurrent pain.  Was last seen in the office on 08/30/2022 with several complaints of a noncardiac etiology,but included hx of hypokalemia.  Past Medical History    Past Medical History:  Diagnosis Date   Angio-edema    Anxiety    Arthritis    Asthma    Clostridioides difficile infection    Complication of anesthesia    takes alot to sedated   COPD (chronic obstructive pulmonary disease) (HCC)    Depression    Fibromyalgia    GERD (gastroesophageal reflux disease)    Hepatitis C    (Dr. Earlean Shawl) Treated with Harvoni March-May 2016   Herpes simplex    Hyperlipemia    Insomnia    Mental disorder    Neuropathy    PONV (postoperative nausea and vomiting)    Sleep apnea    Urticaria    Past Surgical History:  Procedure Laterality Date   BREAST ENHANCEMENT SURGERY     BREAST LUMPECTOMY WITH RADIOACTIVE SEED LOCALIZATION Left 12/26/2014   Procedure: LEFT BREAST LUMPECTOMY WITH RADIOACTIVE SEED LOCALIZATION;  Surgeon: Autumn Messing III, MD;  Location: [place];  Service: General;  Laterality: Left;   BREAST REDUCTION SURGERY     COLONOSCOPY     EXPLORATORY LAPAROTOMY     NM MYOVIEW LTD  07/07/15   Normal Myocardial Perfusion Scan. Low risk lexiscan nuclear study with minimal insignificant breast attenuation and normal myocardial perfusion and  function; EF 53% without wall motion abnormalities and normal systolic thickening   TEMPOROMANDIBULAR JOINT SURGERY     TUBAL LIGATION     UPPER GI ENDOSCOPY     x5    Allergies  Allergies  Allergen Reactions   Keflex [Cephalexin] Hives, Itching and Other (See Comments)    Tongue and throat itching/tingling   Oysters [Shellfish Allergy] Other (See Comments)    Unknown reaction; "MD told me I was allergic"   Vancomycin Anaphylaxis    Face and tongue swells   Zoloft [Sertraline Hcl] Other (See Comments)    REACTION: Nightmares, Grind teeth really bad   Latex Itching    Itch and blisters from contact   Amitriptyline     Hot, itching, tongue tingling, breakthrough bleeding.   Ciprofloxacin Other (See Comments)   Clindamycin Other (See Comments)   Clindamycin/Lincomycin Hives and Other (See Comments)    Stated that this causes her to get really horse and made her feel really hot   Other Other (See Comments)   Penicillins    Pentosan Polysulfate Sodium Swelling    Throat swelling   Shellfish-Derived Products Other (See Comments)   Sulfa Antibiotics Other (See Comments)   Topamax [Topiramate]     Says it makes her "constipation and she can't urinate normally"   Uribel [Meth-Hyo-M Bl-Na Phos-Ph Sal] Nausea Only   Uribel [Urelle] Swelling and Other (See Comments)  Patient stated this makes her stomach swell and makes her feel si   Lamotrigine Rash   Sulfamethoxazole-Trimethoprim Rash    Patient developed feelings of dizziness, rash and itchy hands.  Patient then vomited and had diarrhea.    History of Present Illness    Mrs. Guard comes today for ongoing assessment and management of  CAD with chronic chest pain, HTN, with history of hypokalemia. She was started on low dose potassium replacement of 10 mEq daily. She was to continue triameterene/HCTZ.  A repeat BMET on 11/08/2022 documented potassium of 3.0 after being seen in the ED for allergic reaction from abx Keflex given  for acute cystitis after having had C. difficile infection.  She experienced nausea and shortness of breath. She was treated with famotidine/prednisone after she had taken Benadryl at home. Potassium was found to be 3.0, and was not repleted.  She comes today fatigued, continuing to suffer with frequent diarrhea, bilateral flank pain.  She denies chest pain, palpitations, or dizziness.  Home Medications    Current Outpatient Medications  Medication Sig Dispense Refill   Ascorbic Acid (VITAMIN C) 1000 MG tablet Take 1,000 mg by mouth daily.     aspirin EC 81 MG tablet Take 1 tablet (81 mg total) by mouth daily. 90 tablet 3   cetirizine (ZYRTEC) 10 MG tablet Take 1 tablet (10 mg total) by mouth daily. 30 tablet 5   chlorpheniramine-HYDROcodone (TUSSIONEX) 10-8 MG/5ML SUER      cholecalciferol (VITAMIN D) 1000 UNITS tablet Take 2,000 Units by mouth daily.      cyclobenzaprine (FLEXERIL) 10 MG tablet Take 10 mg by mouth 3 (three) times daily as needed for muscle spasms.     Dextromethorphan-guaiFENesin (TUSSIN DM) 10-100 MG/5ML liquid Take 5 mLs by mouth every 12 (twelve) hours. 200 mL 0   diazepam (VALIUM) 5 MG tablet Take 1 tablet (5 mg total) by mouth 2 (two) times daily. 10 tablet 0   estradiol (VIVELLE-DOT) 0.075 MG/24HR Place onto the skin.     fidaxomicin (DIFICID) 200 MG TABS tablet Take 1 tablet (200 mg total) by mouth 2 (two) times daily. 20 tablet 0   gabapentin (NEURONTIN) 300 MG capsule Take 300 mg by mouth daily as needed (nerve pain).     hydrochlorothiazide (HYDRODIURIL) 50 MG tablet Take 50 mg by mouth daily.     levalbuterol (XOPENEX HFA) 45 MCG/ACT inhaler INHALE 2 PUFFS INTO THE LUNGS EVERY 4 HOURS AS NEEDED FOR WHEEZING 15 g 0   levalbuterol (XOPENEX) 1.25 MG/3ML nebulizer solution Take 1.25 mg by nebulization every 4 (four) hours as needed for wheezing. 72 mL 5   levofloxacin (LEVAQUIN) 500 MG tablet Take 1 tablet (500 mg total) by mouth daily. 7 tablet 0   LORazepam (ATIVAN)  2 MG tablet Take 2 mg by mouth in the morning, at noon, and at bedtime.     meclizine (ANTIVERT) 25 MG tablet Take 25 mg by mouth 3 (three) times daily as needed for dizziness.     montelukast (SINGULAIR) 10 MG tablet Take 1 tablet (10 mg total) by mouth at bedtime. 30 tablet 5   nitroGLYCERIN (NITROSTAT) 0.4 MG SL tablet Place 1 tablet (0.4 mg total) under the tongue every 5 (five) minutes as needed for chest pain. 25 tablet 2   nystatin (MYCOSTATIN) 100000 UNIT/ML suspension Take 5 mL swish and swallow by mouth 4 times a day for 5 days 60 mL 0   pantoprazole (PROTONIX) 40 MG tablet Take 1 tablet (40 mg  total) by mouth daily. 90 tablet 1   potassium chloride (KLOR-CON) 10 MEQ tablet Take 1 tablet (10 mEq total) by mouth daily. 30 tablet 3   progesterone (PROMETRIUM) 100 MG capsule Take 200 mg by mouth at bedtime.     SYMBICORT 160-4.5 MCG/ACT inhaler Inhale 2 puffs into the lungs 2 (two) times daily. 1 each 5   valACYclovir (VALTREX) 500 MG tablet Take 500 mg by mouth daily as needed (outbreaks).     zolpidem (AMBIEN) 10 MG tablet Take 10 mg by mouth at bedtime.     No current facility-administered medications for this visit.   Facility-Administered Medications Ordered in Other Visits  Medication Dose Route Frequency Provider Last Rate Last Admin   chlorhexidine (HIBICLENS) 4 % liquid 1 application  1 application  Topical Once Autumn Messing III, MD       chlorhexidine (HIBICLENS) 4 % liquid 1 application  1 application  Topical Once Jovita Kussmaul, MD         Family History    Family History  Problem Relation Age of Onset   Diabetes Mother    Lung disease Mother    Heart disease Mother    Breast cancer Mother    Heart disease Father    Colon cancer Father    Hypothyroidism Brother    Alzheimer's disease Maternal Grandmother    Alzheimer's disease Maternal Aunt    Skin cancer Maternal Aunt    Stomach cancer Neg Hx    Esophageal cancer Neg Hx    She indicated that her mother is  deceased. She indicated that her father is deceased. She indicated that her sister is alive. She indicated that her brother is alive. She indicated that the status of her maternal grandmother is unknown. She indicated that the status of her maternal aunt is unknown. She indicated that the status of her neg hx is unknown.  Social History    Social History   Socioeconomic History   Marital status: Married    Spouse name: Not on file   Number of children: 0   Years of education: 12   Highest education level: Not on file  Occupational History   Occupation: Retired   Occupation: retired  Tobacco Use   Smoking status: Every Day    Packs/day: 1.00    Types: Cigarettes    Passive exposure: Current   Smokeless tobacco: Never   Tobacco comments:    I ppd 09/04/2022 PAP   Vaping Use   Vaping Use: Never used  Substance and Sexual Activity   Alcohol use: Yes    Comment: daily   Drug use: No   Sexual activity: Yes    Birth control/protection: Surgical  Other Topics Concern   Not on file  Social History Narrative   Patient is right handed.   Four cups caffeine per day.   Lives at home with husband.   Social Determinants of Health   Financial Resource Strain: Not on file  Food Insecurity: Not on file  Transportation Needs: Not on file  Physical Activity: Not on file  Stress: Not on file  Social Connections: Not on file  Intimate Partner Violence: Not on file     Review of Systems    General:  No chills, fever, night sweats or weight changes.  Cardiovascular:  No chest pain, dyspnea on exertion, edema, orthopnea, palpitations, paroxysmal nocturnal dyspnea. Dermatological: No rash, lesions/masses Respiratory: No cough, dyspnea Urologic: No hematuria, dysuria Abdominal:   No nausea, vomiting, diarrhea,  bright red blood per rectum, melena, or hematemesis Neurologic:  No visual changes, wkns, changes in mental status. All other systems reviewed and are otherwise negative except  as noted above.     Physical Exam    VS:  BP 130/82   Pulse 90   Ht '5\' 4"'$  (1.626 m)   Wt 165 lb 3.2 oz (74.9 kg)   SpO2 98%   BMI 28.36 kg/m  , BMI Body mass index is 28.36 kg/m.     GEN: Well nourished, well developed, in no acute distress. HEENT: normal. Neck: Supple, no JVD, carotid bruits, or masses. Cardiac: RRR, no murmurs, rubs, or gallops. No clubbing, cyanosis, edema.  Radials/DP/PT 2+ and equal bilaterally.  Respiratory:  Respirations regular and unlabored, clear to auscultation bilaterally. GI: Soft, some tenderness, nondistended, BS + x 4. MS: no deformity or atrophy. Skin: warm and dry, no rash. Neuro:  Strength and sensation are intact. Psych: Flat affect.  Accessory Clinical Findings      Lab Results  Component Value Date   WBC 12.9 (H) 11/08/2022   HGB 14.0 11/08/2022   HCT 40.4 11/08/2022   MCV 95.1 11/08/2022   PLT 206 11/08/2022   Lab Results  Component Value Date   CREATININE 0.64 11/08/2022   BUN 10 11/08/2022   NA 130 (L) 11/08/2022   K 3.0 (L) 11/08/2022   CL 92 (L) 11/08/2022   CO2 27 11/08/2022   Lab Results  Component Value Date   ALT 22 08/26/2022   AST 33 08/26/2022   ALKPHOS 60 08/26/2022   BILITOT 1.4 (H) 08/26/2022   Lab Results  Component Value Date   CHOL 223 (H) 11/10/2018   HDL 78 11/10/2018   LDLCALC 120 (H) 11/10/2018   TRIG 125 11/10/2018   CHOLHDL 2.9 11/10/2018    Lab Results  Component Value Date   HGBA1C 6.0 (H) 03/10/2021    Review of Prior Studies: NM Stress test 07/07/2015 Nuclear stress EF: 53%. The left ventricular ejection fraction is mildly decreased (45-54%). There was no ST segment deviation noted during stress. Normal myocardial perfusion and function.   Low risk lexiscan nuclear study with minimal insignificant breast attenuation and normal myocardial perfusion and function; EF 53% without wall motion abnormalities and normal systolic thickening.    Assessment & Plan   1.  Hypertension:  She is no longer on triamterene HCTZ, is now on HCTZ 50 mg daily.  She states she has been taking her potassium supplements but I am seeing hypokalemia on recent lab work on 11/08/2022.  Will repeating lab work today.  No changes in her medication regimen as blood pressure is controlled currently.   2.  C. difficile infection: Continues treatment, followed by PCP, I did educate her on handwashing and staying hydrated.  3.  History of Vitamin D deficiency: She states she has been told to take vitamin D supplements but has not been doing so and therefore we will check her level to evaluate her need to continue supplements and follow-up with her PCP as long as redrawing blood.  Current medicines are reviewed at length with the patient today.  I have spent 20 min's  dedicated to the care of this patient on the date of this encounter to include pre-visit review of records, assessment, management and diagnostic testing,with shared decision making.  Signed, Phill Myron. West Pugh, ANP, [place]   11/22/2022 9:46 AM      Office [phone] Fax [phone]  Notice: This dictation was prepared  with Dragon dictation along with smaller phrase technology. Any transcriptional errors that result from this process are unintentional and may not be corrected upon review.

## 2022-11-21 ENCOUNTER — Ambulatory Visit: Payer: BC Managed Care – PPO | Admitting: Adult Health

## 2022-11-21 ENCOUNTER — Telehealth: Payer: Self-pay | Admitting: *Deleted

## 2022-11-21 NOTE — Addendum Note (Signed)
Addended by: Rosanne Sack R on: 11/21/2022 03:29 PM   Modules accepted: Orders

## 2022-11-21 NOTE — Telephone Encounter (Signed)
Spoke with pt and she is aware of Dr. Dayle Points recommendations. Order in for lab. Pt will try to wait several days.

## 2022-11-21 NOTE — Telephone Encounter (Signed)
Patient returning call. Please advise. Thank you.

## 2022-11-21 NOTE — Telephone Encounter (Signed)
Called and spoke with patient, advised that we do not need to have her visit until after her HST.  Advised to go to the desk when she returns the machine and schedule a 2 week out f/u.  She verbalized understanding.  Nothing further needed.

## 2022-11-21 NOTE — Telephone Encounter (Signed)
Pt states she finished the dificid and a few days ago she started having diarrhea again. Reports she has 3-4 diarrhea stools in the am. She wants to come and be tested to make sure it has cleared. She also states she is under a lot of stress and feels that may also be an issue. Please advise.

## 2022-11-22 ENCOUNTER — Ambulatory Visit: Payer: BC Managed Care – PPO | Attending: Adult Health | Admitting: Adult Health

## 2022-11-22 ENCOUNTER — Encounter: Payer: Self-pay | Admitting: Adult Health

## 2022-11-22 VITALS — BP 130/82 | HR 90 | Ht 64.0 in | Wt 165.2 lb

## 2022-11-22 DIAGNOSIS — T502X5D Adverse effect of carbonic-anhydrase inhibitors, benzothiadiazides and other diuretics, subsequent encounter: Secondary | ICD-10-CM

## 2022-11-22 DIAGNOSIS — E876 Hypokalemia: Secondary | ICD-10-CM

## 2022-11-22 DIAGNOSIS — T502X5A Adverse effect of carbonic-anhydrase inhibitors, benzothiadiazides and other diuretics, initial encounter: Secondary | ICD-10-CM | POA: Diagnosis not present

## 2022-11-22 DIAGNOSIS — R5383 Other fatigue: Secondary | ICD-10-CM | POA: Diagnosis not present

## 2022-11-22 LAB — BASIC METABOLIC PANEL
BUN/Creatinine Ratio: 9 — ABNORMAL LOW (ref 12–28)
BUN: 7 mg/dL — ABNORMAL LOW (ref 8–27)
CO2: 25 mmol/L (ref 20–29)
Calcium: 9.8 mg/dL (ref 8.7–10.3)
Chloride: 92 mmol/L — ABNORMAL LOW (ref 96–106)
Creatinine, Ser: 0.77 mg/dL (ref 0.57–1.00)
Glucose: 113 mg/dL — ABNORMAL HIGH (ref 70–99)
Potassium: 3.8 mmol/L (ref 3.5–5.2)
Sodium: 133 mmol/L — ABNORMAL LOW (ref 134–144)
eGFR: 88 mL/min/{1.73_m2} (ref >59)

## 2022-11-22 NOTE — Patient Instructions (Signed)
Medication Instructions:  No Changes *If you need a refill on your cardiac medications before your next appointment, please call your pharmacy*   Lab Work: BMET, Vitamin D. Today If you have labs (blood work) drawn today and your tests are completely normal, you will receive your results only by: [place] (if you have MyChart) OR A paper copy in the mail If you have any lab test that is abnormal or we need to change your treatment, we will call you to review the results.   Testing/Procedures: No Testing   Follow-Up: At [facility], you and your health needs are our priority.  As part of our continuing mission to provide you with exceptional heart care, we have created designated Provider Care Teams.  These Care Teams include your primary Cardiologist (physician) and Advanced Practice Providers (APPs -  Physician Assistants and Nurse Practitioners) who all work together to provide you with the care you need, when you need it.  We recommend signing up for the patient portal called "MyChart".  Sign up information is provided on this After Visit Summary.  MyChart is used to connect with patients for Virtual Visits (Telemedicine).  Patients are able to view lab/test results, encounter notes, upcoming appointments, etc.  Non-urgent messages can be sent to your provider as well.   To learn more about what you can do with MyChart, go to [email or web address].    Your next appointment:   6 month(s)  Provider:   Glenetta Hew, MD

## 2022-11-23 LAB — VITAMIN D 25 HYDROXY (VIT D DEFICIENCY, FRACTURES): Vit D, 25-Hydroxy: 25.2 ng/mL — ABNORMAL LOW (ref 30.0–100.0)

## 2022-11-27 ENCOUNTER — Telehealth: Payer: Self-pay

## 2022-11-27 MED ORDER — POTASSIUM CHLORIDE ER 10 MEQ PO TBCR: 10.0000 meq | EXTENDED_RELEASE_TABLET | Freq: Every day | ORAL | 3 refills | Status: DC

## 2022-11-27 MED ORDER — VITAMIN D (CHOLECALCIFEROL) 25 MCG (1000 UT) PO TABS: 25.0000 ug | ORAL_TABLET | Freq: Every day | ORAL | 3 refills | Status: AC

## 2022-11-27 NOTE — Telephone Encounter (Addendum)
Called patient regarding results. Patient had understanding of results.----- Message from Lendon Colonel, NP sent at 11/22/2022  4:55 PM EST ----- I have reviewed this patient's labs.  She continues to have mildly low sodium but better than prior levels of 132 weeks ago, as it is increased to 133.  This is just 1 point below normal.  Her kidney function and potassium status are normal.  This is good news and she should follow-up with her PCP, and other physicians who are treating her different diagnoses especially GI physicians.  No changes in her regimen

## 2022-11-27 NOTE — Telephone Encounter (Addendum)
Called patient regarding results. Patient had understanding of results. Prescription sent to thepharmacy----- Message from Lendon Colonel, NP sent at 11/25/2022  8:30 AM EST ----- Vitamin D level is low. She will need to be on Vitamin D replacement. She should start 1000 I/U or 25 mcg of Vitamin D 3 daily. Send in Rx for this please.   Sarah Reeves

## 2022-11-27 NOTE — Telephone Encounter (Signed)
-----   Message from Lendon Colonel, NP sent at 11/25/2022  8:30 AM EST ----- Vitamin D level is low. She will need to be on Vitamin D replacement. She should start 1000 I/U or 25 mcg of Vitamin D 3 daily. Send in Rx for this please.   Curt Bears

## 2022-11-28 DIAGNOSIS — R3914 Feeling of incomplete bladder emptying: Secondary | ICD-10-CM | POA: Diagnosis not present

## 2022-11-28 DIAGNOSIS — R3912 Poor urinary stream: Secondary | ICD-10-CM | POA: Diagnosis not present

## 2022-11-29 ENCOUNTER — Telehealth: Payer: Self-pay | Admitting: Family Medicine

## 2022-11-29 MED ORDER — LEVALBUTEROL TARTRATE 45 MCG/ACT IN AERO: INHALATION_SPRAY | RESPIRATORY_TRACT | 1 refills | Status: DC

## 2022-11-29 NOTE — Telephone Encounter (Signed)
Called patient to let her know that her medication is being sent to [facility] [address]. Patient stated that she understood and would tell her friend where to pick it up.

## 2022-11-29 NOTE — Telephone Encounter (Signed)
Patient called to cancel her appointment for 2/12 and wanted to let Webb Silversmith know that she cancelled due to her having a lot of appointments with Urology and Cardiology next week. She wants to sort that out before coming back to see [place]. She is also requesting a refill on albuterol inhaler. She would like this sent to 2 pharmacies ([facility] on [address] on [address]) if possible in case she isn't able to make it before they close. If refill cannot be sent to both she prefers [facility] [address].

## 2022-12-02 ENCOUNTER — Ambulatory Visit: Payer: BC Managed Care – PPO | Admitting: Family Medicine

## 2022-12-03 ENCOUNTER — Ambulatory Visit: Payer: BC Managed Care – PPO

## 2022-12-03 DIAGNOSIS — R0683 Snoring: Secondary | ICD-10-CM

## 2022-12-04 ENCOUNTER — Other Ambulatory Visit: Payer: BC Managed Care – PPO

## 2022-12-04 DIAGNOSIS — R197 Diarrhea, unspecified: Secondary | ICD-10-CM | POA: Diagnosis not present

## 2022-12-04 NOTE — Telephone Encounter (Signed)
Inbound call from patient, states she would like to speak to a nurse in regards to her C-Diff. Patient states she thinks it has returned. In addition, she wanted to speak to a nurse about a pain she has been having under her breast. Patient states, she believes its from the lab stating she had fatty liver. Please advise.

## 2022-12-04 NOTE — Telephone Encounter (Signed)
Pt states she is having pain under her right breast. Pt wants to know if this discomfort could be coming from her liver. Reports she had an abnormal mammogram last year but everything turned out ok. She is having another breast exam 2/21 but reports her breasts have been bothering her. Pt just wants to make sure her liver is ok. Please advise.

## 2022-12-06 LAB — CLOSTRIDIUM DIFFICILE EIA: C difficile Toxins A+B, EIA: NEGATIVE

## 2022-12-06 NOTE — Telephone Encounter (Signed)
Spoke with pt and she is aware her cdiff test was negative and she is aware of comments regarding her liver per Dr. Candis Schatz.

## 2022-12-09 ENCOUNTER — Ambulatory Visit: Payer: BC Managed Care – PPO | Admitting: Adult Health

## 2022-12-11 DIAGNOSIS — Z13820 Encounter for screening for osteoporosis: Secondary | ICD-10-CM | POA: Diagnosis not present

## 2022-12-11 DIAGNOSIS — Z124 Encounter for screening for malignant neoplasm of cervix: Secondary | ICD-10-CM | POA: Diagnosis not present

## 2022-12-11 DIAGNOSIS — Z01419 Encounter for gynecological examination (general) (routine) without abnormal findings: Secondary | ICD-10-CM | POA: Diagnosis not present

## 2022-12-11 DIAGNOSIS — Z6829 Body mass index (BMI) 29.0-29.9, adult: Secondary | ICD-10-CM | POA: Diagnosis not present

## 2022-12-12 ENCOUNTER — Other Ambulatory Visit: Payer: Self-pay | Admitting: Obstetrics and Gynecology

## 2022-12-12 DIAGNOSIS — N644 Mastodynia: Secondary | ICD-10-CM

## 2022-12-17 ENCOUNTER — Encounter: Payer: Self-pay | Admitting: Adult Health

## 2022-12-17 ENCOUNTER — Ambulatory Visit: Payer: BC Managed Care – PPO | Admitting: Adult Health

## 2022-12-17 ENCOUNTER — Telehealth: Payer: Self-pay | Admitting: Pulmonary Disease

## 2022-12-17 VITALS — BP 130/90 | HR 90 | Temp 98.1°F | Ht 64.5 in | Wt 167.8 lb

## 2022-12-17 DIAGNOSIS — J4489 Other specified chronic obstructive pulmonary disease: Secondary | ICD-10-CM | POA: Diagnosis not present

## 2022-12-17 DIAGNOSIS — Z720 Tobacco use: Secondary | ICD-10-CM | POA: Diagnosis not present

## 2022-12-17 DIAGNOSIS — R0683 Snoring: Secondary | ICD-10-CM

## 2022-12-17 NOTE — Telephone Encounter (Signed)
Sleep study result  Date of study: 12/03/2022  Impression: Negative for significant sleep disordered breathing Moderately severe oxygen desaturations  Recommendation:  Evaluate for causes of hypoxemia  Consider in lab sleep study if this still remains significant concern for sleep disordered breathing, if low risk for sleep disordered breathing then management of hypoxemia foregoing further evaluation of hypoxemia will be appropriate  Follow-up as previously scheduled    Do not need to call the patient, Tammy aware of result

## 2022-12-17 NOTE — Patient Instructions (Addendum)
Set up for overnight oximetry test (make sure is done <2 weeks )  CT chest next month as planned  Continue on Breztri 2 puffs Twice daily  , rinse after use.  Albuterol inhaler As needed   Follow up in 3 weeks with PFTs with Monzerrath Mcburney NP or Dr. Ander Slade

## 2022-12-17 NOTE — Assessment & Plan Note (Signed)
Patient is followed by asthma and allergy.  Continue on current regimen with Breztri.  Will check PFTs to evaluate further for her nocturnal hypoxemia.  CT chest is pending.  Plan  Patient Instructions  Set up for overnight oximetry test (make sure is done <2 weeks )  CT chest next month as planned  Continue on Breztri 2 puffs Twice daily  , rinse after use.  Albuterol inhaler As needed   Follow up in 3 weeks with PFTs with Hadlie Gipson NP or Dr. Ander Slade

## 2022-12-17 NOTE — Assessment & Plan Note (Signed)
Work on smoking cessation

## 2022-12-17 NOTE — Progress Notes (Signed)
$'@Patient'a$  ID: Sarah Reeves, female    DOB: 01-Aug-1961, 62 y.o.   MRN: [id number]  Chief Complaint  Patient presents with   Follow-up    Referring provider: Dian Queen, MD  HPI: 62 year old female active smoker seen for sleep consult September 04, 2022 for snoring, daytime sleepiness and restless sleep Medical history significant for chronic chest pain, hypertension, COPD and chronic anxiety, hepatitis C status post Harvoni treatment in 2016  TEST/EVENTS :   12/17/2022 Follow up ;  Patient presents for a 56-monthfollow-up.  Patient was seen last visit for sleep consult.  She had snoring, daytime sleepiness and restless sleep.  She was set up for home sleep study that was done on December 03, 2022, no significant sleep apnea with AHI at 3.4/hour and SpO2 low at 77%.  Average O2 saturations were 84%.  We went over her sleep study results in detail.  Patient is very surprised that she does not have sleep apnea as she has significant snoring, daytime sleepiness and restless sleep.  She does have a diagnosis of COPD.  Spirometry test in November showed minimal airflow obstruction.  She has been set up with the lung cancer CT screening program.  She has a CT scan in 2 weeks. Patient says she has minimum cough.  Occasional shortness of breath.  Walk test today in the office showed no desaturations with ambulation on room air.  O2 saturations remained above 90% with ambulation.  Patient denies any orthopnea or edema.  Recent lab work in January showed normal hemoglobin and hematocrit. She says she is never been on oxygen at home.  Did have an episode where she had a COPD exacerbation and oxygen levels were low but improved with treatment for her COPD.   Allergies  Allergen Reactions   Keflex [Cephalexin] Hives, Itching and Other (See Comments)    Tongue and throat itching/tingling   Oysters [Shellfish Allergy] Other (See Comments)    Unknown reaction; "MD told me I was allergic"    Vancomycin Anaphylaxis    Face and tongue swells   Zoloft [Sertraline Hcl] Other (See Comments)    REACTION: Nightmares, Grind teeth really bad   Latex Itching    Itch and blisters from contact   Amitriptyline     Hot, itching, tongue tingling, breakthrough bleeding.   Ciprofloxacin Other (See Comments)   Clindamycin Other (See Comments)   Clindamycin/Lincomycin Hives and Other (See Comments)    Stated that this causes her to get really horse and made her feel really hot   Other Other (See Comments)   Penicillins    Pentosan Polysulfate Sodium Swelling    Throat swelling   Shellfish-Derived Products Other (See Comments)   Sulfa Antibiotics Other (See Comments)   Topamax [Topiramate]     Says it makes her "constipation and she can't urinate normally"   Uribel [Meth-Hyo-M Bl-Na Phos-Ph Sal] Nausea Only   Uribel [Urelle] Swelling and Other (See Comments)    Patient stated this makes her stomach swell and makes her feel si   Lamotrigine Rash   Sulfamethoxazole-Trimethoprim Rash    Patient developed feelings of dizziness, rash and itchy hands.  Patient then vomited and had diarrhea.    Immunization History  Administered Date(s) Administered   Influenza,inj,Quad PF,6+ Mos 06/27/2018, 07/07/2019   Pneumococcal Polysaccharide-23 06/27/2018   Td 10/21/1998   Td (Adult),5 Lf Tetanus Toxid, Preservative Free 10/21/1998   Unspecified SARS-COV-2 Vaccination 01/10/2020, 02/07/2020   Zoster Recombinat (Shingrix) 07/14/2019, 09/24/2019  Past Medical History:  Diagnosis Date   Angio-edema    Anxiety    Arthritis    Asthma    Clostridioides difficile infection    Complication of anesthesia    takes alot to sedated   COPD (chronic obstructive pulmonary disease) (HCC)    Depression    Fibromyalgia    GERD (gastroesophageal reflux disease)    Hepatitis C    (Dr. Earlean Shawl) Treated with Harvoni March-May 2016   Herpes simplex    Hyperlipemia    Insomnia    Mental disorder     Neuropathy    PONV (postoperative nausea and vomiting)    Sleep apnea    Urticaria     Tobacco History: Social History   Tobacco Use  Smoking Status Every Day   Packs/day: 1.00   Types: Cigarettes   Passive exposure: Current  Smokeless Tobacco Never  Tobacco Comments   Close to a ppd.  Trying to quit.  12/17/2022 hfb   Ready to quit: Not Answered Counseling given: Not Answered Tobacco comments: Close to a ppd.  Trying to quit.  12/17/2022 hfb   Outpatient Medications Prior to Visit  Medication Sig Dispense Refill   Ascorbic Acid (VITAMIN C) 1000 MG tablet Take 1,000 mg by mouth daily.     aspirin EC 81 MG tablet Take 1 tablet (81 mg total) by mouth daily. 90 tablet 3   Budeson-Glycopyrrol-Formoterol (BREZTRI AEROSPHERE) 160-9-4.8 MCG/ACT AERO Inhale 2 puffs into the lungs in the morning and at bedtime. Alternates between symbicort and Breztri     cetirizine (ZYRTEC) 10 MG tablet Take 1 tablet (10 mg total) by mouth daily. 30 tablet 5   chlorpheniramine-HYDROcodone (TUSSIONEX) 10-8 MG/5ML SUER      cholecalciferol (VITAMIN D) 1000 UNITS tablet Take 2,000 Units by mouth daily.      cyclobenzaprine (FLEXERIL) 10 MG tablet Take 10 mg by mouth 3 (three) times daily as needed for muscle spasms.     Dextromethorphan-guaiFENesin (TUSSIN DM) 10-100 MG/5ML liquid Take 5 mLs by mouth every 12 (twelve) hours. 200 mL 0   diazepam (VALIUM) 5 MG tablet Take 1 tablet (5 mg total) by mouth 2 (two) times daily. 10 tablet 0   estradiol (VIVELLE-DOT) 0.075 MG/24HR Place onto the skin.     fidaxomicin (DIFICID) 200 MG TABS tablet Take 1 tablet (200 mg total) by mouth 2 (two) times daily. 20 tablet 0   gabapentin (NEURONTIN) 300 MG capsule Take 300 mg by mouth daily as needed (nerve pain).     hydrochlorothiazide (HYDRODIURIL) 50 MG tablet Take 50 mg by mouth daily.     levalbuterol (XOPENEX HFA) 45 MCG/ACT inhaler INHALE 2 PUFFS INTO THE LUNGS EVERY 4 HOURS AS NEEDED FOR WHEEZING 15 g 1    levalbuterol (XOPENEX) 1.25 MG/3ML nebulizer solution Take 1.25 mg by nebulization every 4 (four) hours as needed for wheezing. 72 mL 5   levofloxacin (LEVAQUIN) 500 MG tablet Take 1 tablet (500 mg total) by mouth daily. 7 tablet 0   LORazepam (ATIVAN) 2 MG tablet Take 2 mg by mouth in the morning, at noon, and at bedtime.     meclizine (ANTIVERT) 25 MG tablet Take 25 mg by mouth 3 (three) times daily as needed for dizziness.     montelukast (SINGULAIR) 10 MG tablet Take 1 tablet (10 mg total) by mouth at bedtime. 30 tablet 5   nystatin (MYCOSTATIN) 100000 UNIT/ML suspension Take 5 mL swish and swallow by mouth 4 times a day for 5 days  60 mL 0   pantoprazole (PROTONIX) 40 MG tablet Take 1 tablet (40 mg total) by mouth daily. 90 tablet 1   potassium chloride (KLOR-CON) 10 MEQ tablet Take 1 tablet (10 mEq total) by mouth daily. 30 tablet 3   progesterone (PROMETRIUM) 100 MG capsule Take 200 mg by mouth at bedtime.     SYMBICORT 160-4.5 MCG/ACT inhaler Inhale 2 puffs into the lungs 2 (two) times daily. 1 each 5   valACYclovir (VALTREX) 500 MG tablet Take 500 mg by mouth daily as needed (outbreaks).     Vitamin D, Cholecalciferol, 25 MCG (1000 UT) TABS Take 25 mcg by mouth daily. 60 tablet 3   zolpidem (AMBIEN) 10 MG tablet Take 10 mg by mouth at bedtime.     nitroGLYCERIN (NITROSTAT) 0.4 MG SL tablet Place 1 tablet (0.4 mg total) under the tongue every 5 (five) minutes as needed for chest pain. 25 tablet 2   Facility-Administered Medications Prior to Visit  Medication Dose Route Frequency Provider Last Rate Last Admin   chlorhexidine (HIBICLENS) 4 % liquid 1 application  1 application  Topical Once Autumn Messing III, MD       chlorhexidine (HIBICLENS) 4 % liquid 1 application  1 application  Topical Once Autumn Messing III, MD         Review of Systems:   Constitutional:   No  weight loss, night sweats,  Fevers, chills,  +fatigue, or  lassitude.  HEENT:   No headaches,  Difficulty swallowing,   Tooth/dental problems, or  Sore throat,                No sneezing, itching, ear ache, nasal congestion, post nasal drip,   CV:  No chest pain,  Orthopnea, PND, swelling in lower extremities, anasarca, dizziness, palpitations, syncope.   GI  No heartburn, indigestion, abdominal pain, nausea, vomiting, diarrhea, change in bowel habits, loss of appetite, bloody stools.   Resp: No shortness of breath with exertion or at rest.  No excess mucus, no productive cough,  No non-productive cough,  No coughing up of blood.  No change in color of mucus.  No wheezing.  No chest wall deformity  Skin: no rash or lesions.  GU: no dysuria, change in color of urine, no urgency or frequency.  No flank pain, no hematuria   MS:  No joint pain or swelling.  No decreased range of motion.  No back pain.    Physical Exam  BP (!) 130/90 (BP Location: Left Arm, Patient Position: Sitting, Cuff Size: Normal)   Pulse 90   Temp 98.1 F (36.7 C) (Oral)   Ht 5' 4.5" (1.638 m)   Wt 167 lb 12.8 oz (76.1 kg)   SpO2 98%   BMI 28.36 kg/m   GEN: A/Ox3; pleasant , NAD, well nourished    HEENT:  [place]/AT,  , NOSE-clear, THROAT-clear, no lesions, no postnasal drip or exudate noted.  Class II-III MP airway  NECK:  Supple w/ fair ROM; no JVD; normal carotid impulses w/o bruits; no thyromegaly or nodules palpated; no lymphadenopathy.    RESP  Clear  P & A; w/o, wheezes/ rales/ or rhonchi. no accessory muscle use, no dullness to percussion  CARD:  RRR, no m/r/g, no peripheral edema, pulses intact, no cyanosis or clubbing.  GI:   Soft & nt; nml bowel sounds; no organomegaly or masses detected.   Musco: Warm bil, no deformities or joint swelling noted.   Neuro: alert, no focal deficits noted.  Skin: Warm, no lesions or rashes    Lab Results:  CBC    Component Value Date/Time   WBC 12.9 (H) 11/08/2022 0917   RBC 4.25 11/08/2022 0917   HGB 14.0 11/08/2022 0917   HCT 40.4 11/08/2022 0917   PLT 206 11/08/2022  0917   MCV 95.1 11/08/2022 0917   MCH 32.9 11/08/2022 0917   MCHC 34.7 11/08/2022 0917   RDW 12.6 11/08/2022 0917   LYMPHSABS 3.0 11/08/2022 0917   MONOABS 0.8 11/08/2022 0917   EOSABS 0.1 11/08/2022 0917   BASOSABS 0.1 11/08/2022 0917    BMET    Component Value Date/Time   NA 133 (L) 11/22/2022 0957   K 3.8 11/22/2022 0957   CL 92 (L) 11/22/2022 0957   CO2 25 11/22/2022 0957   GLUCOSE 113 (H) 11/22/2022 0957   GLUCOSE 113 (H) 11/08/2022 0917   BUN 7 (L) 11/22/2022 0957   CREATININE 0.77 11/22/2022 0957   CALCIUM 9.8 11/22/2022 0957   CALCIUM 9.1 11/08/2022 0917   GFRNONAA >60 11/08/2022 0917   GFRAA 101 11/10/2018 1014    BNP    Component Value Date/Time   BNP 66.0 03/09/2021 1300    ProBNP No results found for: "PROBNP"  Imaging: No results found.        No data to display          No results found for: "NITRICOXIDE"      Assessment & Plan:   Snoring Snoring, daytime sleepiness and restless sleep Home sleep study with no significant sleep apnea.  AHI was 3.4/hour.  We reviewed her sleep study results in detail.  She did have significant not nocturnal hypoxemia.  Will need further workup for this.  Will set patient up for an overnight oximetry test along with full PFTs.  CT chest is pending in 2 weeks. If symptoms persist or unable to explain nocturnal hypoxemia consider an in lab sleep study.  Plan  Patient Instructions  Set up for overnight oximetry test (make sure is done <2 weeks )  CT chest next month as planned  Continue on Breztri 2 puffs Twice daily  , rinse after use.  Albuterol inhaler As needed   Follow up in 3 weeks with PFTs with Cha Gomillion NP or Dr. Ander Slade   '   Tobacco use Work on smoking cessation.  Asthma-COPD overlap syndrome ([place]) Patient is followed by asthma and allergy.  Continue on current regimen with Breztri.  Will check PFTs to evaluate further for her nocturnal hypoxemia.  CT chest is pending.  Plan  Patient  Instructions  Set up for overnight oximetry test (make sure is done <2 weeks )  CT chest next month as planned  Continue on Breztri 2 puffs Twice daily  , rinse after use.  Albuterol inhaler As needed   Follow up in 3 weeks with PFTs with Alleyah Twombly NP or Dr. Marlene Bast, NP 12/17/2022

## 2022-12-17 NOTE — Addendum Note (Signed)
Addended by: Vanessa Barbara on: 12/17/2022 01:23 PM   Modules accepted: Orders

## 2022-12-17 NOTE — Assessment & Plan Note (Signed)
Snoring, daytime sleepiness and restless sleep Home sleep study with no significant sleep apnea.  AHI was 3.4/hour.  We reviewed her sleep study results in detail.  She did have significant not nocturnal hypoxemia.  Will need further workup for this.  Will set patient up for an overnight oximetry test along with full PFTs.  CT chest is pending in 2 weeks. If symptoms persist or unable to explain nocturnal hypoxemia consider an in lab sleep study.  Plan  Patient Instructions  Set up for overnight oximetry test (make sure is done <2 weeks )  CT chest next month as planned  Continue on Breztri 2 puffs Twice daily  , rinse after use.  Albuterol inhaler As needed   Follow up in 3 weeks with PFTs with Marina Desire NP or Dr. Ander Slade   '

## 2022-12-17 NOTE — Progress Notes (Signed)
error 

## 2022-12-25 ENCOUNTER — Telehealth: Payer: Self-pay | Admitting: Pulmonary Disease

## 2022-12-25 NOTE — Telephone Encounter (Signed)
Pt calling bc she was a told she would be delivered a oxygen reader, and doesn't know what company should be delivering it. Pls Advise

## 2022-12-25 NOTE — Telephone Encounter (Signed)
Spoke with patient. She advised pulse ox for ONO was delivered today. She was advised they would come pick it up tomorrow. I gave her the number to [employer] to clarify. Nothing further needed.

## 2022-12-26 ENCOUNTER — Telehealth: Payer: Self-pay | Admitting: Adult Health

## 2022-12-26 NOTE — Telephone Encounter (Signed)
Please get ONO results when available  Will need ER if not improving  Spoke with patient and aware

## 2022-12-26 NOTE — Telephone Encounter (Signed)
I called and spoke with the pt  She states she had her ONO done last night  [employer] is supposed to get the machine back today and then we should receive results  She says while she was trying to go to sleep, she could see that her heart was racing and sats into the low 80's on RA She had to get up and take anxiety med to help  She is concerned something is wrong with her heart  Denies any CP, but her pulse rate was 120-130 last night at rest  She is anxious to get results and I explained that we will address further once we get those results  I urged her to call cards for f/u and she states "I can't ever get them on the phone" She wants Tammy's opinion on what could be going on with her  Dr Ander Slade not in clinic today  She wants someone to call her back today  I advised to seek emergent care should her symptoms return  Please advise, thanks!

## 2022-12-26 NOTE — Telephone Encounter (Signed)
Patient states oxygen levels are 80 % at night. Patient had rapid heartbeat. Patient phone number is [phone].

## 2022-12-27 ENCOUNTER — Telehealth: Payer: Self-pay | Admitting: Cardiology

## 2022-12-27 NOTE — Telephone Encounter (Signed)
Pt c/o BP issue: STAT if pt c/o blurred vision, one-sided weakness or slurred speech  1. What are your last 5 BP readings?  No readings available   2. Are you having any other symptoms (ex. Dizziness, headache, blurred vision, passed out)?  Occasional pain under left breast, oxygen is also low at night   3. What is your BP issue?  BP in fine in the morning, but increases as the day goes on

## 2022-12-27 NOTE — Telephone Encounter (Signed)
Call to patient. She states pain under Left breast has been for approx. a month.  It is not there when she gets up but within about 3 hours it will present.  It comes and goes throughout the day.  Difficult to get straight answers.  She states pain last 2 minutes to an hour . She states it is painful then states just annoying pain, then says it is deep and achy She discusses her old injury of broken arm and her arm and hand tingling since doing "cleaning at the house"  She discusses GERD. Worried about her increased pulse in the 120's and dropping O2 sats at night.  (Currently following Pulmonary and note in chart discusses this with patient and possible ER visit if worsens) . Se has not taken Nitro to see if relieves pain due to her migraines and "fear it will make it worse".  She states she does have anxiety because her husband said he had pain from one shoulder to the other and when she put him to bed after this pain, he "died before my eyes".  She does not have any BP readings though she  discusses they were high a year ago and stays focused on those readings.  She is concerned with the heart rate of in the 120's though only monitored through the sleep study machine one night. She received a new machine to re-do study today.  I advised the doctor would review her symptoms.  She states "I hope he doesn't want a stress test because that was horrible and I thought I was going to die and I am not doing that".  Again she mentions anxiety through all this with the sleep studies.  Please advise

## 2022-12-30 NOTE — Telephone Encounter (Signed)
Called patient and discussed the provider will need to see her to discuss the mentioned issues.  She has a[ppt. With pulmonary on Wednesday March 13 and a CT on Thursday th 14th.  She will need to be scheduled around these appt.'s.Sent to scheduling pool

## 2023-01-01 ENCOUNTER — Encounter: Payer: Self-pay | Admitting: Acute Care

## 2023-01-01 ENCOUNTER — Ambulatory Visit (INDEPENDENT_AMBULATORY_CARE_PROVIDER_SITE_OTHER): Payer: BC Managed Care – PPO | Admitting: Acute Care

## 2023-01-01 ENCOUNTER — Telehealth: Payer: Self-pay | Admitting: Adult Health

## 2023-01-01 DIAGNOSIS — F17210 Nicotine dependence, cigarettes, uncomplicated: Secondary | ICD-10-CM

## 2023-01-01 NOTE — Telephone Encounter (Signed)
Patient checking on scheduling in lab sleep test. Patient doing ONO on 01/06/2023. Patient phone number is [phone].

## 2023-01-01 NOTE — Telephone Encounter (Signed)
Called patient, she states that the first one did show any results because she was not given any instructions on how to use the equipment, she did not push the button and so no results were recorded.  She is to get the equipment again on 3/18 to do the test again.  I advised her I would be on the look out for the results after the test.    She was asking about the in lab sleep study.  I advised her that I would get a message to one of our patient care coordinators and once I got an update, I would call her back.  She asked that if I did not get her to leave a detailed message.  I let her know that I would do so.  After reviewing her last office visit, I did not see any mention of an in lab sleep study.  Tammy, Do you want an in lab sleep study on this patient?  Please advise.  Thank you.

## 2023-01-01 NOTE — Patient Instructions (Signed)
Thank you for participating in the [facility]. It was our pleasure to meet you today. We will call you with the results of your scan within the next few days. Your scan will be assigned a Lung RADS category score by the physicians reading the scans.  This Lung RADS score determines follow up scanning.  See below for description of categories, and follow up screening recommendations. We will be in touch to schedule your follow up screening annually or based on recommendations of our providers. We will fax a copy of your scan results to your Primary Care Physician, or the physician who referred you to the program, to ensure they have the results. Please call the office if you have any questions or concerns regarding your scanning experience or results.  Our office number is [phone]. Please speak with Denise Phelps, RN. , or  Denise Buckner RN, They are  our Lung Cancer Screening RN.'s If They are unavailable when you call, Please leave a message on the voice mail. We will return your call at our earliest convenience.This voice mail is monitored several times a day.  Remember, if your scan is normal, we will scan you annually as long as you continue to meet the criteria for the program. (Age 50-80, Current smoker or smoker who has quit within the last 15 years). If you are a smoker, remember, quitting is the single most powerful action that you can take to decrease your risk of lung cancer and other pulmonary, breathing related problems. We know quitting is hard, and we are here to help.  Please let us know if there is anything we can do to help you meet your goal of quitting. If you are a former smoker, congratulations. We are proud of you! Remain smoke free! Remember you can refer friends or family members through the number above.  We will screen them to make sure they meet criteria for the program. Thank you for helping us take better care of you by  participating in Lung Screening.  You can receive free nicotine replacement therapy ( patches, gum or mints) by calling 1-800-QUIT NOW. Please call so we can get you on the path to becoming  a non-smoker. I know it is hard, but you can do this!  Lung RADS Categories:  Lung RADS 1: no nodules or definitely non-concerning nodules.  Recommendation is for a repeat annual scan in 12 months.  Lung RADS 2:  nodules that are non-concerning in appearance and behavior with a very low likelihood of becoming an active cancer. Recommendation is for a repeat annual scan in 12 months.  Lung RADS 3: nodules that are probably non-concerning , includes nodules with a low likelihood of becoming an active cancer.  Recommendation is for a 6-month repeat screening scan. Often noted after an upper respiratory illness. We will be in touch to make sure you have no questions, and to schedule your 6-month scan.  Lung RADS 4 A: nodules with concerning findings, recommendation is most often for a follow up scan in 3 months or additional testing based on our provider's assessment of the scan. We will be in touch to make sure you have no questions and to schedule the recommended 3 month follow up scan.  Lung RADS 4 B:  indicates findings that are concerning. We will be in touch with you to schedule additional diagnostic testing based on our provider's  assessment of the scan.  Other options for assistance in smoking cessation (   As covered by your insurance benefits)  Hypnosis for smoking cessation  [employer] [phone]  Acupuncture for smoking cessation  [facility] [phone]   

## 2023-01-01 NOTE — Progress Notes (Signed)
Virtual Visit via Telephone Note  I connected with Sarah Reeves on 01/01/23 at 11:00 AM EDT by telephone and verified that I am speaking with the correct person using two identifiers.  Location: Patient:  At home Provider:  [place], [place], [place], [address]    I discussed the limitations, risks, security and privacy concerns of performing an evaluation and management service by telephone and the availability of in person appointments. I also discussed with the patient that there may be a patient responsible charge related to this service. The patient expressed understanding and agreed to proceed.   Shared Decision Making Visit Lung Cancer Screening Program [phone])   Eligibility: Age 62 y.o. Pack Years Smoking History Calculation 47 pack year smoking history (# packs/per year x # years smoked) Recent History of coughing up blood  no Unexplained weight loss? no ( >Than 15 pounds within the last 6 months ) Prior History Lung / other cancer no (Diagnosis within the last 5 years already requiring surveillance chest CT Scans). Smoking Status Current Smoker Former Smokers: Years since quit:  NA  Quit Date:  NA  Visit Components: Discussion included one or more decision making aids. yes Discussion included risk/benefits of screening. yes Discussion included potential follow up diagnostic testing for abnormal scans. yes Discussion included meaning and risk of over diagnosis. yes Discussion included meaning and risk of False Positives. yes Discussion included meaning of total radiation exposure. yes  Counseling Included: Importance of adherence to annual lung cancer LDCT screening. yes Impact of comorbidities on ability to participate in the program. yes Ability and willingness to under diagnostic treatment. yes  Smoking Cessation Counseling: Current Smokers:  Discussed importance of smoking cessation. yes Information about tobacco cessation classes and  interventions provided to patient. yes Patient provided with "ticket" for LDCT Scan. yes Symptomatic Patient. no  Counseling NA Diagnosis Code: Tobacco Use Z72.0 Asymptomatic Patient yes  Counseling (Intermediate counseling: > three minutes counseling) [id number] Former Smokers:  Discussed the importance of maintaining cigarette abstinence. yes Diagnosis Code: Personal History of Nicotine Dependence. [id number] Information about tobacco cessation classes and interventions provided to patient. Yes Patient provided with "ticket" for LDCT Scan. yes Written Order for Lung Cancer Screening with LDCT placed in Epic. Yes (CT Chest Lung Cancer Screening Low Dose W/O CM) [id number] Z12.2-Screening of respiratory organs Z87.891-Personal history of nicotine dependence  I have spent 25 minutes of face to face/ virtual visit   time with  Sarah Reeves discussing the risks and benefits of lung cancer screening. We viewed / discussed a power point together that explained in detail the above noted topics. We paused at intervals to allow for questions to be asked and answered to ensure understanding.We discussed that the single most powerful action that she can take to decrease her risk of developing lung cancer is to quit smoking. We discussed whether or not she is ready to commit to setting a quit date. We discussed options for tools to aid in quitting smoking including nicotine replacement therapy, non-nicotine medications, support groups, Quit Smart classes, and behavior modification. We discussed that often times setting smaller, more achievable goals, such as eliminating 1 cigarette a day for a week and then 2 cigarettes a day for a week can be helpful in slowly decreasing the number of cigarettes smoked. This allows for a sense of accomplishment as well as providing a clinical benefit. I provided  her  with smoking cessation  information  with contact information for community resources, classes,  free nicotine  replacement therapy, and access to mobile apps, text messaging, and on-line smoking cessation help. I have also provided  her  the office contact information in the event she needs to contact me, or the screening staff. We discussed the time and location of the scan, and that either Doroteo Glassman RN, Joella Prince, RN  or I will call / send a letter with the results within 24-72 hours of receiving them. The patient verbalized understanding of all of  the above and had no further questions upon leaving the office. They have my contact information in the event they have any further questions.  I spent 3 minutes counseling on smoking cessation and the health risks of continued tobacco abuse.  I explained to the patient that there has been a high incidence of coronary artery disease noted on these exams. I explained that this is a non-gated exam therefore degree or severity cannot be determined. This patient is not on statin therapy. I have asked the patient to follow-up with their PCP regarding any incidental finding of coronary artery disease and management with diet or medication as their PCP  feels is clinically indicated. The patient verbalized understanding of the above and had no further questions upon completion of the visit.   Pt. Has had weight gain of 40 pounds in the last year after her husbands death. I have referred her to healthy weight and wellness.    Magdalen Spatz, NP 01/01/2023

## 2023-01-01 NOTE — Telephone Encounter (Signed)
Called and spoke with Brad ([employer]) regarding the ONO results.  Machine was turned in on 3/8.  There are no results available at this time.  Will be available in a day or two.  He also said there was an ONO in for 3/18 as well.  I will be on the look out for the results of the ONO.

## 2023-01-02 ENCOUNTER — Ambulatory Visit
Admission: RE | Admit: 2023-01-02 | Discharge: 2023-01-02 | Disposition: A | Discharge status: Home / Self Care | Payer: BC Managed Care – PPO | Source: Ambulatory Visit | Attending: Acute Care | Admitting: Acute Care

## 2023-01-02 DIAGNOSIS — R5383 Other fatigue: Secondary | ICD-10-CM | POA: Diagnosis not present

## 2023-01-02 DIAGNOSIS — F17210 Nicotine dependence, cigarettes, uncomplicated: Secondary | ICD-10-CM | POA: Diagnosis not present

## 2023-01-02 DIAGNOSIS — B351 Tinea unguium: Secondary | ICD-10-CM | POA: Diagnosis not present

## 2023-01-02 DIAGNOSIS — R102 Pelvic and perineal pain: Secondary | ICD-10-CM | POA: Diagnosis not present

## 2023-01-02 DIAGNOSIS — Z87891 Personal history of nicotine dependence: Secondary | ICD-10-CM

## 2023-01-02 DIAGNOSIS — Z122 Encounter for screening for malignant neoplasm of respiratory organs: Secondary | ICD-10-CM

## 2023-01-02 DIAGNOSIS — N950 Postmenopausal bleeding: Secondary | ICD-10-CM | POA: Diagnosis not present

## 2023-01-02 NOTE — Telephone Encounter (Signed)
ATC LVMTCB x 1  

## 2023-01-02 NOTE — Telephone Encounter (Signed)
I was waiting on the ONO before deciding on sleep study in lab

## 2023-01-03 NOTE — Telephone Encounter (Signed)
Spoke with pt and let her know that Sarah Reeves was waiting on ONO results before scheduling inlab sleep test. Pt stated understanding. RN called [employer] to confirm ONO machine would be sent out on Monday January 06, 2023. Nothing further needed at this time.

## 2023-01-03 NOTE — Telephone Encounter (Signed)
Pt. Calling back from the visit prior

## 2023-01-06 ENCOUNTER — Other Ambulatory Visit: Payer: Self-pay

## 2023-01-06 DIAGNOSIS — F17210 Nicotine dependence, cigarettes, uncomplicated: Secondary | ICD-10-CM

## 2023-01-06 DIAGNOSIS — G4730 Sleep apnea, unspecified: Secondary | ICD-10-CM | POA: Diagnosis not present

## 2023-01-06 DIAGNOSIS — R0683 Snoring: Secondary | ICD-10-CM | POA: Diagnosis not present

## 2023-01-06 DIAGNOSIS — Z87891 Personal history of nicotine dependence: Secondary | ICD-10-CM

## 2023-01-07 ENCOUNTER — Telehealth: Payer: Self-pay | Admitting: Gastroenterology

## 2023-01-07 DIAGNOSIS — R5383 Other fatigue: Secondary | ICD-10-CM | POA: Diagnosis not present

## 2023-01-07 NOTE — Telephone Encounter (Signed)
Inbound call from patient, is requesting to speak with a nurse in regards to getting retested for C-Diff. She states she is having diarrhea and is scared she caught it again in her house. Please advise.

## 2023-01-07 NOTE — Telephone Encounter (Signed)
See note below and advise. 

## 2023-01-08 ENCOUNTER — Encounter: Payer: BC Managed Care – PPO | Admitting: Gastroenterology

## 2023-01-08 ENCOUNTER — Telehealth: Payer: Self-pay | Admitting: Family Medicine

## 2023-01-08 ENCOUNTER — Other Ambulatory Visit: Payer: Self-pay

## 2023-01-08 ENCOUNTER — Telehealth: Payer: Self-pay | Admitting: Acute Care

## 2023-01-08 DIAGNOSIS — R197 Diarrhea, unspecified: Secondary | ICD-10-CM

## 2023-01-08 NOTE — Telephone Encounter (Signed)
Patient called and stated that she has thrush and would like a prescription called in to [facility] on [address]. Patient also stated that she has been having a lot of issues and seeing a pulmonologist. Patient says that she is also getting a lot of testing done. Patient would like a call back so she can speak with you. Patients call back number is [phone]

## 2023-01-08 NOTE — Telephone Encounter (Signed)
Patient advises she may have c-diff again and wants to know if mycelex troches is okay to use or is there a mouth wash that would be better. Sarah Reeves I apologize she didn't tell me this when I spoke with her before

## 2023-01-08 NOTE — Telephone Encounter (Signed)
PT calling and has developed thrush due to her inhalers. Asks that we call in RX for her.   PHARM: [facility] on [address]  Pls call PT @ [phone]

## 2023-01-08 NOTE — Telephone Encounter (Signed)
Lab order entered in epic. Pt scheduled to see Dr. Candis Schatz 02/17/23 at 3:40pm. Appt letter mailed to pt.Sarah Reeves

## 2023-01-08 NOTE — Telephone Encounter (Signed)
I see that her pulmonary team has called in mycelex troches which will treat thrush

## 2023-01-08 NOTE — Telephone Encounter (Signed)
Spoke with patient, she advises Symbicort has cause her to develop thrush. Patient is requesting something be sent for her.  Judson Roch can you please advise  Pharmacy [facility] on [address]

## 2023-01-09 ENCOUNTER — Other Ambulatory Visit: Payer: BC Managed Care – PPO

## 2023-01-09 DIAGNOSIS — R197 Diarrhea, unspecified: Secondary | ICD-10-CM | POA: Diagnosis not present

## 2023-01-09 MED ORDER — CLOTRIMAZOLE 10 MG MT TROC: 10.0000 mg | Freq: Every day | OROMUCOSAL | 0 refills | Status: DC

## 2023-01-09 NOTE — Telephone Encounter (Signed)
Spoke with patient. Advised per Sarah Reeves she can take the Myclex with c-diff. Prescription has been sent to pharmacy. Patient advises she has received kit from pcp to test for c-diff.  She also advises she has a spacer at home. Closing encounter. NFN

## 2023-01-09 NOTE — Telephone Encounter (Signed)
Called patient and setup an appt with Sarah Reeves for tomorrow, 03/22 at 2:45 pm.

## 2023-01-10 ENCOUNTER — Encounter: Payer: Self-pay | Admitting: Nurse Practitioner

## 2023-01-10 ENCOUNTER — Other Ambulatory Visit: Payer: Self-pay

## 2023-01-10 ENCOUNTER — Ambulatory Visit: Payer: BC Managed Care – PPO | Attending: Nurse Practitioner | Admitting: Nurse Practitioner

## 2023-01-10 VITALS — BP 135/89 | HR 108 | Ht 64.5 in | Wt 165.8 lb

## 2023-01-10 DIAGNOSIS — I10 Essential (primary) hypertension: Secondary | ICD-10-CM

## 2023-01-10 DIAGNOSIS — R079 Chest pain, unspecified: Secondary | ICD-10-CM

## 2023-01-10 DIAGNOSIS — J4489 Other specified chronic obstructive pulmonary disease: Secondary | ICD-10-CM

## 2023-01-10 DIAGNOSIS — F411 Generalized anxiety disorder: Secondary | ICD-10-CM

## 2023-01-10 DIAGNOSIS — I2510 Atherosclerotic heart disease of native coronary artery without angina pectoris: Secondary | ICD-10-CM | POA: Diagnosis not present

## 2023-01-10 MED ORDER — OLMESARTAN MEDOXOMIL 20 MG PO TABS: 20.0000 mg | ORAL_TABLET | Freq: Every day | ORAL | 3 refills | Status: DC

## 2023-01-10 NOTE — Patient Instructions (Addendum)
Medication Instructions:  Stop HCTZ as directed Start Olmesartan 20 mg daily Metoprolol Tartrate 100 mg one tablet 2 hours prior to CT  *If you need a refill on your cardiac medications before your next appointment, please call your pharmacy*   Lab Work: Your physician recommends that you complete lab work today and  return for lab work in 2 weeks. BMET (today) Fasting lipid panel & CMET (2 weeks)   If you have labs (blood work) drawn today and your tests are completely normal, you will receive your results only by: [place] (if you have MyChart) OR A paper copy in the mail If you have any lab test that is abnormal or we need to change your treatment, we will call you to review the results.   Testing/Procedures:   Your cardiac CT will be scheduled at one of the below locations:   [facility] [address] [place], [place] [address] (336) [place] [address] [place], [place] [address] [phone]  [facility] [place],  [address] [phone]  If scheduled at [facility], please arrive at the [facility] and Children's Entrance (Entrance C2) of [facility] 30 minutes prior to test start time. You can use the FREE valet parking offered at entrance C (encouraged to control the heart rate for the test)  Proceed to the [facility] Radiology Department (first floor) to check-in and test prep.  All radiology patients and guests should use entrance C2 at [facility], accessed from [facility], even though the hospital's physical address listed is [address].    If scheduled at [facility] or [facility], please arrive 15 mins early for check-in and test prep.   Please follow these instructions carefully (unless otherwise directed):  Hold all  erectile dysfunction medications at least 3 days (72 hrs) prior to test. (Ie viagra, cialis, sildenafil, tadalafil, etc) We will administer nitroglycerin during this exam.   On the Night Before the Test: Be sure to Drink plenty of water. Do not consume any caffeinated/decaffeinated beverages or chocolate 12 hours prior to your test. Do not take any antihistamines 12 hours prior to your test. If the patient has contrast allergy: Patient will need a prescription for Prednisone and very clear instructions (as follows): Prednisone 50 mg - take 13 hours prior to test Take another Prednisone 50 mg 7 hours prior to test Take another Prednisone 50 mg 1 hour prior to test Take Benadryl 50 mg 1 hour prior to test Patient must complete all four doses of above prophylactic medications. Patient will need a ride after test due to Benadryl.  On the Day of the Test: Drink plenty of water until 1 hour prior to the test. Do not eat any food 1 hour prior to test. You may take your regular medications prior to the test.  Take metoprolol (Lopressor) two hours prior to test. If you take Furosemide/Hydrochlorothiazide/Spironolactone, please HOLD on the morning of the test. FEMALES- please wear underwire-free bra if available, avoid dresses & tight clothing   *For Clinical Staff only. Please instruct patient the following:* Heart Rate Medication Recommendations for Cardiac CT  Resting HR < 50 bpm  No medication  Resting HR 50-60 bpm and BP >110/50 mmHG   Consider Metoprolol tartrate 25 mg PO 90-120 min prior to scan  Resting HR 60-65 bpm and  BP >110/50 mmHG  Metoprolol tartrate 50 mg PO 90-120 minutes prior to scan   Resting HR > 65 bpm and BP >110/50 mmHG  Metoprolol tartrate 100 mg PO 90-120 minutes prior to scan  Consider Ivabradine 10-15 mg PO or a calcium channel blocker for resting HR >60 bpm and contraindication to metoprolol tartrate  Consider Ivabradine 10-15 mg PO in combination with metoprolol  tartrate for HR >80 bpm         After the Test: Drink plenty of water. After receiving IV contrast, you may experience a mild flushed feeling. This is normal. On occasion, you may experience a mild rash up to 24 hours after the test. This is not dangerous. If this occurs, you can take Benadryl 25 mg and increase your fluid intake. If you experience trouble breathing, this can be serious. If it is severe call 911 IMMEDIATELY. If it is mild, please call our office. If you take any of these medications: Glipizide/Metformin, Avandament, Glucavance, please do not take 48 hours after completing test unless otherwise instructed.  We will call to schedule your test 2-4 weeks out understanding that some insurance companies will need an authorization prior to the service being performed.   For non-scheduling related questions, please contact the cardiac imaging nurse navigator should you have any questions/concerns: Marchia Bond, Cardiac Imaging Nurse Navigator Gordy Clement, Cardiac Imaging Nurse Navigator [facility] Direct Office Dial: [phone]   For scheduling needs, including cancellations and rescheduling, please call [place], [phone].    Follow-Up: At [facility], you and your health needs are our priority.  As part of our continuing mission to provide you with exceptional heart care, we have created designated Provider Care Teams.  These Care Teams include your primary Cardiologist (physician) and Advanced Practice Providers (APPs -  Physician Assistants and Nurse Practitioners) who all work together to provide you with the care you need, when you need it.  We recommend signing up for the patient portal called "MyChart".  Sign up information is provided on this After Visit Summary.  MyChart is used to connect with patients for Virtual Visits (Telemedicine).  Patients are able to view lab/test results, encounter notes, upcoming appointments, etc.   Non-urgent messages can be sent to your provider as well.   To learn more about what you can do with MyChart, go to [email or web address].    Your next appointment:   6-8 week(s)  Provider:   Diona Browner, NP        Other Instructions  Monitor Blood pressure. Goal is 130/80 or less

## 2023-01-10 NOTE — Progress Notes (Unsigned)
Office Visit    Patient Name: Sarah Reeves Date of Encounter: 01/10/2023  Primary Care Provider:  Dian Queen, MD Primary Cardiologist:  Glenetta Hew, MD  Chief Complaint    62 year old female with a history of chronic chest pain, low risk Myoview in 2016, hypertension, COPD, tobacco use, hepatitis, and chronic anxiety who presents for follow-up related to chest pain.  Past Medical History    Past Medical History:  Diagnosis Date   Angio-edema    Anxiety    Arthritis    Asthma    Clostridioides difficile infection    Complication of anesthesia    takes alot to sedated   COPD (chronic obstructive pulmonary disease) (HCC)    Depression    Fibromyalgia    GERD (gastroesophageal reflux disease)    Hepatitis C    (Dr. Earlean Shawl) Treated with Harvoni March-May 2016   Herpes simplex    Hyperlipemia    Insomnia    Mental disorder    Neuropathy    PONV (postoperative nausea and vomiting)    Sleep apnea    Urticaria    Past Surgical History:  Procedure Laterality Date   BREAST ENHANCEMENT SURGERY     BREAST LUMPECTOMY WITH RADIOACTIVE SEED LOCALIZATION Left 12/26/2014   Procedure: LEFT BREAST LUMPECTOMY WITH RADIOACTIVE SEED LOCALIZATION;  Surgeon: Autumn Messing III, MD;  Location: [facility];  Service: General;  Laterality: Left;   BREAST REDUCTION SURGERY     COLONOSCOPY     EXPLORATORY LAPAROTOMY     NM MYOVIEW LTD  07/07/15   Normal Myocardial Perfusion Scan. Low risk lexiscan nuclear study with minimal insignificant breast attenuation and normal myocardial perfusion and function; EF 53% without wall motion abnormalities and normal systolic thickening   TEMPOROMANDIBULAR JOINT SURGERY     TUBAL LIGATION     UPPER GI ENDOSCOPY     x5    Allergies  Allergies  Allergen Reactions   Keflex [Cephalexin] Hives, Itching and Other (See Comments)    Tongue and throat itching/tingling   Oysters [Shellfish Allergy] Other (See Comments)    Unknown reaction;  "MD told me I was allergic"   Vancomycin Anaphylaxis    Face and tongue swells   Zoloft [Sertraline Hcl] Other (See Comments)    REACTION: Nightmares, Grind teeth really bad   Latex Itching    Itch and blisters from contact   Amitriptyline     Hot, itching, tongue tingling, breakthrough bleeding.   Ciprofloxacin Other (See Comments)   Clindamycin Other (See Comments)   Clindamycin/Lincomycin Hives and Other (See Comments)    Stated that this causes her to get really horse and made her feel really hot   Other Other (See Comments)   Penicillins    Pentosan Polysulfate Sodium Swelling    Throat swelling   Shellfish-Derived Products Other (See Comments)   Sulfa Antibiotics Other (See Comments)   Topamax [Topiramate]     Says it makes her "constipation and she can't urinate normally"   Uribel [Meth-Hyo-M Bl-Na Phos-Ph Sal] Nausea Only   Uribel [Urelle] Swelling and Other (See Comments)    Patient stated this makes her stomach swell and makes her feel si   Lamotrigine Rash   Sulfamethoxazole-Trimethoprim Rash    Patient developed feelings of dizziness, rash and itchy hands.  Patient then vomited and had diarrhea.     Labs/Other Studies Reviewed    The following studies were reviewed today: Lexiscan myoview 06/2015: Nuclear stress EF: 53%. The left ventricular ejection  fraction is mildly decreased (45-54%). There was no ST segment deviation noted during stress. Normal myocardial perfusion and function.   Low risk lexiscan nuclear study with minimal insignificant breast attenuation and normal myocardial perfusion and function; EF 53% without wall motion abnormalities and normal systolic thickening.   Recent Labs: 08/26/2022: ALT 22 11/08/2022: Hemoglobin 14.0; Platelets 206; TSH 2.047 11/22/2022: BUN 7; Creatinine, Ser 0.77; Potassium 3.8; Sodium 133  Recent Lipid Panel    Component Value Date/Time   CHOL 223 (H) 11/10/2018 1014   TRIG 125 11/10/2018 1014   HDL 78 11/10/2018 1014    CHOLHDL 2.9 11/10/2018 1014   CHOLHDL 3.3 06/27/2018 0623   VLDL 23 06/27/2018 0623   LDLCALC 120 (H) 11/10/2018 1014    History of Present Illness    62 year old female with the above past medical history including chronic chest pain, low risk Myoview in 2016, hypertension, COPD, tobacco use, hepatitis, and chronic anxiety.  She was last seen in the office on 11/22/2022 and was stable overall from a cardiac standpoint.  She noted having a C. difficile infection in 10/2022 reported nausea and shortness of breath at time.  BP was well-controlled.  She contacted our office on 12/27/2022 with concerns for intermittent chest pain, transient elevated heart rate.   She presents today for follow-up accompanied by her friend. Since her last visit  Continues to note intermittent chest discomfort.  She recently had a CT of the chest for lung cancer screening that showed evidence of aortic atherosclerosis, coronary calcifications.  She continues to note intermittent chest discomfort.  She has been struggling with low sodium and low potassium.  She is on hydrochlorothiazide for blood pressure control despite these ongoing issues.  Will stop hydrochlorothiazide.  Will start olmesartan 20 mg daily.  Continue to monitor BP and report BP consistently greater than 130/80.  Will check BMET today.  Will repeat fasting lipid panel, CMET in 2 weeks.  Follow-up in 6 to 8 weeks.  Follow-up as scheduled with pulmonology.  Initial sleep study was negative for sleep apnea.  She is pending PFTs. Chest pain: Hypertension: COPD/tobacco use:  Anxiety: Disposition:    Home Medications    Current Outpatient Medications  Medication Sig Dispense Refill   Ascorbic Acid (VITAMIN C) 1000 MG tablet Take 1,000 mg by mouth daily.     aspirin EC 81 MG tablet Take 1 tablet (81 mg total) by mouth daily. 90 tablet 3   Budeson-Glycopyrrol-Formoterol (BREZTRI AEROSPHERE) 160-9-4.8 MCG/ACT AERO Inhale 2 puffs into the lungs in the  morning and at bedtime. Alternates between symbicort and Breztri     cetirizine (ZYRTEC) 10 MG tablet Take 1 tablet (10 mg total) by mouth daily. 30 tablet 5   chlorpheniramine-HYDROcodone (TUSSIONEX) 10-8 MG/5ML SUER      cholecalciferol (VITAMIN D) 1000 UNITS tablet Take 2,000 Units by mouth daily.      clotrimazole (MYCELEX) 10 MG troche Take 1 tablet (10 mg total) by mouth 5 (five) times daily. 70 tablet 0   cyclobenzaprine (FLEXERIL) 10 MG tablet Take 10 mg by mouth 3 (three) times daily as needed for muscle spasms.     Dextromethorphan-guaiFENesin (TUSSIN DM) 10-100 MG/5ML liquid Take 5 mLs by mouth every 12 (twelve) hours. 200 mL 0   estradiol (VIVELLE-DOT) 0.075 MG/24HR Place onto the skin.     fidaxomicin (DIFICID) 200 MG TABS tablet Take 1 tablet (200 mg total) by mouth 2 (two) times daily. 20 tablet 0   gabapentin (NEURONTIN) 300 MG capsule Take  300 mg by mouth daily as needed (nerve pain).     levalbuterol (XOPENEX HFA) 45 MCG/ACT inhaler INHALE 2 PUFFS INTO THE LUNGS EVERY 4 HOURS AS NEEDED FOR WHEEZING 15 g 1   levalbuterol (XOPENEX) 1.25 MG/3ML nebulizer solution Take 1.25 mg by nebulization every 4 (four) hours as needed for wheezing. 72 mL 5   levofloxacin (LEVAQUIN) 500 MG tablet Take 1 tablet (500 mg total) by mouth daily. 7 tablet 0   LORazepam (ATIVAN) 2 MG tablet Take 2 mg by mouth in the morning, at noon, and at bedtime.     meclizine (ANTIVERT) 25 MG tablet Take 25 mg by mouth 3 (three) times daily as needed for dizziness.     montelukast (SINGULAIR) 10 MG tablet Take 1 tablet (10 mg total) by mouth at bedtime. 30 tablet 5   nystatin (MYCOSTATIN) 100000 UNIT/ML suspension Take 5 mL swish and swallow by mouth 4 times a day for 5 days 60 mL 0   olmesartan (BENICAR) 20 MG tablet Take 1 tablet (20 mg total) by mouth daily. 90 tablet 3   pantoprazole (PROTONIX) 40 MG tablet Take 1 tablet (40 mg total) by mouth daily. 90 tablet 1   potassium chloride (KLOR-CON) 10 MEQ tablet Take  1 tablet (10 mEq total) by mouth daily. 30 tablet 3   progesterone (PROMETRIUM) 100 MG capsule Take 200 mg by mouth at bedtime.     SYMBICORT 160-4.5 MCG/ACT inhaler Inhale 2 puffs into the lungs 2 (two) times daily. 1 each 5   valACYclovir (VALTREX) 500 MG tablet Take 500 mg by mouth daily as needed (outbreaks).     Vitamin D, Cholecalciferol, 25 MCG (1000 UT) TABS Take 25 mcg by mouth daily. 60 tablet 3   zolpidem (AMBIEN) 10 MG tablet Take 10 mg by mouth at bedtime.     diazepam (VALIUM) 5 MG tablet Take 1 tablet (5 mg total) by mouth 2 (two) times daily. (Patient not taking: Reported on 01/10/2023) 10 tablet 0   nitroGLYCERIN (NITROSTAT) 0.4 MG SL tablet Place 1 tablet (0.4 mg total) under the tongue every 5 (five) minutes as needed for chest pain. 25 tablet 2   No current facility-administered medications for this visit.   Facility-Administered Medications Ordered in Other Visits  Medication Dose Route Frequency Provider Last Rate Last Admin   chlorhexidine (HIBICLENS) 4 % liquid 1 application  1 application  Topical Once Autumn Messing III, MD       chlorhexidine (HIBICLENS) 4 % liquid 1 application  1 application  Topical Once Jovita Kussmaul, MD         Review of Systems    ***.  All other systems reviewed and are otherwise negative except as noted above.    Physical Exam    VS:  BP 135/89   Pulse (!) 108   Ht 5' 4.5" (1.638 m)   Wt 165 lb 12.8 oz (75.2 kg)   SpO2 96%   BMI 28.02 kg/m   GEN: Well nourished, well developed, in no acute distress. HEENT: normal. Neck: Supple, no JVD, carotid bruits, or masses. Cardiac: RRR, no murmurs, rubs, or gallops. No clubbing, cyanosis, edema.  Radials/DP/PT 2+ and equal bilaterally.  Respiratory:  Respirations regular and unlabored, clear to auscultation bilaterally. GI: Soft, nontender, nondistended, BS + x 4. MS: no deformity or atrophy. Skin: warm and dry, no rash. Neuro:  Strength and sensation are intact. Psych: Normal  affect.  Accessory Clinical Findings    ECG personally reviewed by  me today -NSR, 96 bpm- no acute changes.   Lab Results  Component Value Date   WBC 12.9 (H) 11/08/2022   HGB 14.0 11/08/2022   HCT 40.4 11/08/2022   MCV 95.1 11/08/2022   PLT 206 11/08/2022   Lab Results  Component Value Date   CREATININE 0.77 11/22/2022   BUN 7 (L) 11/22/2022   NA 133 (L) 11/22/2022   K 3.8 11/22/2022   CL 92 (L) 11/22/2022   CO2 25 11/22/2022   Lab Results  Component Value Date   ALT 22 08/26/2022   AST 33 08/26/2022   ALKPHOS 60 08/26/2022   BILITOT 1.4 (H) 08/26/2022   Lab Results  Component Value Date   CHOL 223 (H) 11/10/2018   HDL 78 11/10/2018   LDLCALC 120 (H) 11/10/2018   TRIG 125 11/10/2018   CHOLHDL 2.9 11/10/2018    Lab Results  Component Value Date   HGBA1C 6.0 (H) 03/10/2021    Assessment & Plan    1.  ***      Lenna Sciara, NP 01/10/2023, 5:05 PM

## 2023-01-11 LAB — BASIC METABOLIC PANEL
BUN/Creatinine Ratio: 17 (ref 12–28)
BUN: 11 mg/dL (ref 8–27)
CO2: 24 mmol/L (ref 20–29)
Calcium: 9.8 mg/dL (ref 8.7–10.3)
Chloride: 93 mmol/L — ABNORMAL LOW (ref 96–106)
Creatinine, Ser: 0.64 mg/dL (ref 0.57–1.00)
Glucose: 99 mg/dL (ref 70–99)
Potassium: 3.6 mmol/L (ref 3.5–5.2)
Sodium: 134 mmol/L (ref 134–144)
eGFR: 101 mL/min/{1.73_m2} (ref >59)

## 2023-01-12 ENCOUNTER — Encounter: Payer: Self-pay | Admitting: Nurse Practitioner

## 2023-01-13 ENCOUNTER — Other Ambulatory Visit: Payer: Self-pay

## 2023-01-13 LAB — C. DIFFICILE GDH AND TOXIN A/B
GDH ANTIGEN: DETECTED
MICRO NUMBER:: 14723874
SPECIMEN QUALITY:: ADEQUATE
TOXIN A AND B: NOT DETECTED

## 2023-01-13 LAB — CLOSTRIDIUM DIFFICILE TOXIN B, QUALITATIVE, REAL-TIME PCR: Toxigenic C. Difficile by PCR: NOT DETECTED

## 2023-01-13 MED ORDER — METOPROLOL TARTRATE 100 MG PO TABS: ORAL_TABLET | ORAL | 0 refills | Status: DC

## 2023-01-15 ENCOUNTER — Telehealth: Payer: Self-pay | Admitting: Gastroenterology

## 2023-01-15 ENCOUNTER — Telehealth: Payer: Self-pay | Admitting: Adult Health

## 2023-01-15 DIAGNOSIS — G4734 Idiopathic sleep related nonobstructive alveolar hypoventilation: Secondary | ICD-10-CM

## 2023-01-15 NOTE — Telephone Encounter (Signed)
Discussed with pt that her cdiff stool results were negative. Pt states she has had 6 watery stools this morning. Discussed with pt that she should try Imodium as the directions on the box state and that she could try IB guard otc to see if it helps. Pt verbalized understanding.

## 2023-01-15 NOTE — Telephone Encounter (Signed)
Patient called and wants to discuss recent lab results. Stated she is also having severe diarrhea.

## 2023-01-15 NOTE — Telephone Encounter (Signed)
Patient would like results of ONO. Patient phone number is [phone].

## 2023-01-16 ENCOUNTER — Telehealth (HOSPITAL_COMMUNITY): Payer: Self-pay | Admitting: Emergency Medicine

## 2023-01-16 NOTE — Telephone Encounter (Signed)
Looked up front and in B Pod, did not see any ONO results.   Heather, have you seen any ONO results for this patient?

## 2023-01-16 NOTE — Telephone Encounter (Signed)
ONO results are on Tammy's review folder.  She will return to the office on 4/1.  Called and spoke with the patient, I let her know that we have the results of her ONO, however Tammy has not had a chance to review them and give her interpretation and she is currently out of the office until Monday.  I advised her that I would let Tammy know that she called about her ONO results.  She wanted me to let Tammy know taht her Cortisol level was high at 24 and she is going to be seeing an endocrinologist and she is having an echocardiogram done tomorrow.  Tammy, please advise regarding ONO results.  Thank you.

## 2023-01-16 NOTE — Telephone Encounter (Signed)
Reaching out to patient to offer assistance regarding upcoming cardiac imaging study; pt verbalizes understanding of appt date/time, parking situation and where to check in, pre-test NPO status and medications ordered, and verified current allergies; name and call back number provided for further questions should they arise Sarah Bond RN Navigator Cardiac Imaging [facility] [phone] office [phone] cell  Arrival [address] entrance Denies iv issues 100mg  metoprolol tartrate  Aware contrast/nitro

## 2023-01-17 ENCOUNTER — Ambulatory Visit (HOSPITAL_BASED_OUTPATIENT_CLINIC_OR_DEPARTMENT_OTHER)
Admission: RE | Admit: 2023-01-17 | Discharge: 2023-01-17 | Disposition: A | Discharge status: Home / Self Care | Payer: BC Managed Care – PPO | Source: Ambulatory Visit | Attending: Cardiology | Admitting: Cardiology

## 2023-01-17 ENCOUNTER — Other Ambulatory Visit: Payer: Self-pay | Admitting: Cardiology

## 2023-01-17 ENCOUNTER — Ambulatory Visit (HOSPITAL_COMMUNITY)
Admission: RE | Admit: 2023-01-17 | Discharge: 2023-01-17 | Disposition: A | Discharge status: Home / Self Care | Payer: BC Managed Care – PPO | Source: Ambulatory Visit | Attending: Nurse Practitioner | Admitting: Nurse Practitioner

## 2023-01-17 DIAGNOSIS — I2510 Atherosclerotic heart disease of native coronary artery without angina pectoris: Secondary | ICD-10-CM | POA: Insufficient documentation

## 2023-01-17 DIAGNOSIS — R079 Chest pain, unspecified: Secondary | ICD-10-CM | POA: Insufficient documentation

## 2023-01-17 DIAGNOSIS — R931 Abnormal findings on diagnostic imaging of heart and coronary circulation: Secondary | ICD-10-CM

## 2023-01-17 MED ORDER — DILTIAZEM HCL 25 MG/5ML IV SOLN
10.0000 mg | INTRAVENOUS | Status: DC | PRN
  Administered 2023-01-17: 10 mg via INTRAVENOUS

## 2023-01-17 MED ORDER — NITROGLYCERIN 0.4 MG SL SUBL
0.8000 mg | SUBLINGUAL_TABLET | Freq: Once | SUBLINGUAL | Status: AC
  Administered 2023-01-17: 0.8 mg via SUBLINGUAL

## 2023-01-17 MED ORDER — NITROGLYCERIN 0.4 MG SL SUBL
SUBLINGUAL_TABLET | SUBLINGUAL | Status: AC
  Filled 2023-01-17: qty 2

## 2023-01-17 MED ORDER — IOHEXOL 350 MG/ML SOLN
95.0000 mL | Freq: Once | INTRAVENOUS | Status: AC | PRN
  Administered 2023-01-17: 95 mL via INTRAVENOUS

## 2023-01-17 MED ORDER — DILTIAZEM HCL 25 MG/5ML IV SOLN
INTRAVENOUS | Status: AC
  Administered 2023-01-17: 10 mg via INTRAVENOUS
  Filled 2023-01-17: qty 5

## 2023-01-19 HISTORY — PX: CT CTA CORONARY W/CA SCORE W/CM &/OR WO/CM: HXRAD787

## 2023-01-20 NOTE — Telephone Encounter (Signed)
ONO shows nocturnal hypoxemia , O2 sats <88%- 6hr , avg 86%, low sats 77% .  Unclear etiology for hypxoemia. HST neg for sleep apnea.  Recommended O2 2l/m At bedtime   Definitely recommend Echo to r/o pulmonary HTN or valvular dz,  PFTs pending .  Keep follow up this month to review results.

## 2023-01-20 NOTE — Addendum Note (Signed)
Addended by: Lorretta Harp on: 01/20/2023 03:58 PM   Modules accepted: Orders

## 2023-01-20 NOTE — Telephone Encounter (Signed)
Called and spoke with patient, advised of results/recommendations per Rexene Edison NP.  She stated she just had a cardiac cath and wondered why they did not do the Echo while she was in the hospital.  Advised I was unsure, but she could follow up with her cardiologist.  She is to have blood work done for her cardiologist and will ask about the echocardiogram.  Order placed for oxygen at 2L at HS.  Nothing further needed.

## 2023-01-21 ENCOUNTER — Telehealth: Payer: Self-pay | Admitting: Adult Health

## 2023-01-21 DIAGNOSIS — G4736 Sleep related hypoventilation in conditions classified elsewhere: Secondary | ICD-10-CM | POA: Diagnosis not present

## 2023-01-21 DIAGNOSIS — J449 Chronic obstructive pulmonary disease, unspecified: Secondary | ICD-10-CM | POA: Diagnosis not present

## 2023-01-21 NOTE — Telephone Encounter (Signed)
Spoke with pt who states she has not heard anything about any O2 deliveries.  Called and spoke with [facility] who states the order is currently being processed and pt will hear from them today.  Called pt back and gave update. Pt stated understanding. Nothing further needed at this time.

## 2023-01-21 NOTE — Telephone Encounter (Signed)
PT would like Sarah Reeves to call her. She got the machines and tanks but she is concerned it is so loud she won't be able to sleep. She has anxiety  so she has a hard time sleeping as it is.  [phone]

## 2023-01-21 NOTE — Telephone Encounter (Signed)
Patient called to inform the nurse that she has not received the oxygen that was supposed to  be delivered urgently.  She stated that the doctor told her it would come yesterday and she has not been called and there is not oxygen.  Please advise and call patient to discuss further at [phone]

## 2023-01-22 ENCOUNTER — Telehealth: Payer: Self-pay | Admitting: Adult Health

## 2023-01-22 NOTE — Telephone Encounter (Signed)
Pt wants to see if she can have a sleep study, pt has a appointment  on 4/19. Pt said she will keep this appointment to discuss this matter

## 2023-01-22 NOTE — Telephone Encounter (Signed)
Can we reach out to DME to check on concentrator due to loud noise .  Will need ov for pain to eval . No reason for O2 to cause pain . Please contact office for sooner follow up if symptoms do not improve or worsen or seek emergency care

## 2023-01-22 NOTE — Telephone Encounter (Signed)
Called and spoke to pt, pt has concerns of having right side pain. Ms.Airanna just started using her o2 at night. Pt wonders if o2 could be the cause. Tammy please advise

## 2023-01-23 ENCOUNTER — Telehealth: Payer: Self-pay | Admitting: Cardiology

## 2023-01-23 ENCOUNTER — Telehealth: Payer: Self-pay

## 2023-01-23 NOTE — Telephone Encounter (Signed)
Spoke to pt. Pt was notified of CT Coronary results. Pt will complete labs as recommended tomorrow. Pt will continue current medication and f/u as planned.

## 2023-01-23 NOTE — Telephone Encounter (Signed)
Spoke with [patient regarding results from [facility]  Recent coronary CT angiogram revealed nonobstructive coronary artery disease.  There is no evidence of a significant blockage in heart.  Overall, this is a stable report. I would like to update your cholesterol panel as discussed at last office visit (orders are already placed).  We can discuss any changes to your medication regimen at next follow-up visit. Otherwise, continue current medications and follow-up as planned. Thank you-EM   Patient asks multiple times so that's good?Marland Kitchen Confirmed no blockage.  She wants to discuss her oxygen and doesn't like it at home because too oud and why she has it.  Advised to speak to pulmonary since they have shown she needs this and to continue use until she speaks to them. She agrees.

## 2023-01-23 NOTE — Telephone Encounter (Signed)
Pt calling to f/u on Heart Cath results. She would like a callback regarding this matter. Please advise

## 2023-01-24 DIAGNOSIS — I10 Essential (primary) hypertension: Secondary | ICD-10-CM | POA: Diagnosis not present

## 2023-01-24 DIAGNOSIS — R079 Chest pain, unspecified: Secondary | ICD-10-CM | POA: Diagnosis not present

## 2023-01-24 DIAGNOSIS — J4489 Other specified chronic obstructive pulmonary disease: Secondary | ICD-10-CM | POA: Diagnosis not present

## 2023-01-24 DIAGNOSIS — F411 Generalized anxiety disorder: Secondary | ICD-10-CM | POA: Diagnosis not present

## 2023-01-25 LAB — COMPREHENSIVE METABOLIC PANEL
ALT: 27 IU/L (ref 0–32)
AST: 34 IU/L (ref 0–40)
Albumin/Globulin Ratio: 1.8 (ref 1.2–2.2)
Albumin: 4.4 g/dL (ref 3.8–4.9)
Alkaline Phosphatase: 71 IU/L (ref 44–121)
BUN/Creatinine Ratio: 22 (ref 12–28)
BUN: 19 mg/dL (ref 8–27)
Bilirubin Total: 0.4 mg/dL (ref 0.0–1.2)
CO2: 22 mmol/L (ref 20–29)
Calcium: 9.4 mg/dL (ref 8.7–10.3)
Chloride: 103 mmol/L (ref 96–106)
Creatinine, Ser: 0.87 mg/dL (ref 0.57–1.00)
Globulin, Total: 2.4 g/dL (ref 1.5–4.5)
Glucose: 118 mg/dL — ABNORMAL HIGH (ref 70–99)
Potassium: 4.6 mmol/L (ref 3.5–5.2)
Sodium: 140 mmol/L (ref 134–144)
Total Protein: 6.8 g/dL (ref 6.0–8.5)
eGFR: 76 mL/min/{1.73_m2} (ref >59)

## 2023-01-25 LAB — LIPID PANEL
Chol/HDL Ratio: 4.5 ratio — ABNORMAL HIGH (ref 0.0–4.4)
Cholesterol, Total: 272 mg/dL — ABNORMAL HIGH (ref 100–199)
HDL: 60 mg/dL (ref >39)
LDL Chol Calc (NIH): 121 mg/dL — ABNORMAL HIGH (ref 0–99)
Triglycerides: 514 mg/dL — ABNORMAL HIGH (ref 0–149)
VLDL Cholesterol Cal: 91 mg/dL — ABNORMAL HIGH (ref 5–40)

## 2023-01-28 NOTE — Telephone Encounter (Signed)
Rerouting due to no contact or reply.TY.

## 2023-01-29 ENCOUNTER — Telehealth: Payer: Self-pay | Admitting: Adult Health

## 2023-01-29 ENCOUNTER — Telehealth: Payer: Self-pay

## 2023-01-29 ENCOUNTER — Other Ambulatory Visit: Payer: Self-pay

## 2023-01-29 DIAGNOSIS — E7800 Pure hypercholesterolemia, unspecified: Secondary | ICD-10-CM

## 2023-01-29 DIAGNOSIS — Z79899 Other long term (current) drug therapy: Secondary | ICD-10-CM

## 2023-01-29 DIAGNOSIS — R0602 Shortness of breath: Secondary | ICD-10-CM

## 2023-01-29 MED ORDER — ROSUVASTATIN CALCIUM 10 MG PO TABS: 10.0000 mg | ORAL_TABLET | Freq: Every day | ORAL | 3 refills | Status: DC

## 2023-01-29 NOTE — Telephone Encounter (Signed)
Order placed

## 2023-01-29 NOTE — Telephone Encounter (Signed)
Please refer to encounter from 4/10.

## 2023-01-29 NOTE — Telephone Encounter (Signed)
Please reach out to patient and DME company to sort out what is going on with her concentrator.  Concentrator should last continuously. Also patient had been recommended for 2D echo I do not see that this has been set up. If patient is having worsening issues please set up office visit for to evaluate further.  Please contact office for sooner follow up if symptoms do not improve or worsen or seek emergency care

## 2023-01-29 NOTE — Telephone Encounter (Signed)
I do not see any orders for a Echo

## 2023-01-29 NOTE — Telephone Encounter (Signed)
Called Melissa- had to [facility]- need to ask what type of equipment was left with pt- she says they told her her concentrator will only last 2 wks.  Will await call back from her   PCC's- can you please go ahead and get her ECHO scheduled? She has appt with [place] on 4/19 so would be preferred to have this done before this appt.    I called and spoke with the pt  She is aware we are looking into o2 situation and scheduling ECHO  She does not feel she needs sooner appt

## 2023-01-29 NOTE — Telephone Encounter (Signed)
Pt called the office stating that she is currently on oxygen at night. States that she has noticed some times during the night when she checks her sats, she will be dropping into the 80s and once she noticed that she had dropped to 77% and this is with pt wearing her 2L oxygen at night.  States that she has also noticed that her pulse will be normal one moment and then it will drop down to 50.  Pt said that she did bump her oxygen up to 3L which did help to keep her sats stable but states that she does not want to use all of her oxygen supply due to what was brought to her house by the DME. Pt said that a home concentrator was brought which she was told would last her about 2 weeks if she uses 2L O2 and she was also brought about 6 tanks from DME.  Pt said during the day, she is fatigued. With how pt feels, she wants to know what might be able to be recommended. Tammy, please advise.

## 2023-01-29 NOTE — Telephone Encounter (Signed)
Spoke with  pt. Pt was notified of lab results and recommendations. Crestor 10 mg sent to pts pharmacy. Lab orders placed for repeat labs. Pt wanted to mention that she is now on oxygen due to her 02 dropping to 75  per pts pulmonologist.

## 2023-01-29 NOTE — Telephone Encounter (Signed)
Called and spoke with Melissa. I read the previous note from [facility]. She stated that the patient received a stationary concentrator and 6 regular tanks for her setup. She stated that she would call the patient and check on her.   Will close this encounter since Melissa will call the patient.

## 2023-01-29 NOTE — Telephone Encounter (Signed)
I have the Echo to schedule Sarah Reeves called the nurse back

## 2023-01-31 NOTE — Telephone Encounter (Signed)
Called and spoke with patient, advised her that we are waiting for her insurance company to provide prior authorization and that once they provide it the procedure can be scheduled.  She verbalized understanding.  Nothing further needed.

## 2023-02-06 ENCOUNTER — Other Ambulatory Visit: Payer: BC Managed Care – PPO

## 2023-02-07 ENCOUNTER — Ambulatory Visit: Payer: BC Managed Care – PPO | Admitting: Adult Health

## 2023-02-07 ENCOUNTER — Encounter: Payer: Self-pay | Admitting: Adult Health

## 2023-02-07 ENCOUNTER — Ambulatory Visit (INDEPENDENT_AMBULATORY_CARE_PROVIDER_SITE_OTHER): Payer: BC Managed Care – PPO | Admitting: Pulmonary Disease

## 2023-02-07 VITALS — BP 104/70 | HR 107 | Temp 98.4°F | Ht 64.0 in | Wt 165.4 lb

## 2023-02-07 DIAGNOSIS — J4489 Other specified chronic obstructive pulmonary disease: Secondary | ICD-10-CM

## 2023-02-07 DIAGNOSIS — J9611 Chronic respiratory failure with hypoxia: Secondary | ICD-10-CM | POA: Insufficient documentation

## 2023-02-07 DIAGNOSIS — Z720 Tobacco use: Secondary | ICD-10-CM

## 2023-02-07 DIAGNOSIS — R0683 Snoring: Secondary | ICD-10-CM

## 2023-02-07 LAB — PULMONARY FUNCTION TEST
DL/VA % pred: 74 %
DL/VA: 3.13 ml/min/mmHg/L
DLCO cor % pred: 80 %
DLCO cor: 16.29 ml/min/mmHg
DLCO unc % pred: 80 %
DLCO unc: 16.29 ml/min/mmHg
FEF 25-75 Post: 1.85 L/sec
FEF 25-75 Pre: 1.67 L/sec
FEF2575-%Change-Post: 11 %
FEF2575-%Pred-Post: 78 %
FEF2575-%Pred-Pre: 71 %
FEV1-%Change-Post: 2 %
FEV1-%Pred-Post: 93 %
FEV1-%Pred-Pre: 91 %
FEV1-Post: 2.4 L
FEV1-Pre: 2.34 L
FEV1FVC-%Change-Post: 3 %
FEV1FVC-%Pred-Pre: 92 %
FEV6-%Change-Post: -1 %
FEV6-%Pred-Post: 100 %
FEV6-%Pred-Pre: 101 %
FEV6-Post: 3.2 L
FEV6-Pre: 3.23 L
FEV6FVC-%Pred-Post: 103 %
FEV6FVC-%Pred-Pre: 103 %
FVC-%Change-Post: 0 %
FVC-%Pred-Post: 96 %
FVC-%Pred-Pre: 97 %
FVC-Post: 3.2 L
FVC-Pre: 3.23 L
Post FEV1/FVC ratio: 75 %
Post FEV6/FVC ratio: 100 %
Pre FEV1/FVC ratio: 73 %
Pre FEV6/FVC Ratio: 100 %
RV % pred: 84 %
RV: 1.68 L
TLC % pred: 101 %
TLC: 5.14 L

## 2023-02-07 MED ORDER — BREZTRI AEROSPHERE 160-9-4.8 MCG/ACT IN AERO: 2.0000 | INHALATION_SPRAY | Freq: Two times a day (BID) | RESPIRATORY_TRACT | 6 refills | Status: DC

## 2023-02-07 MED ORDER — LEVALBUTEROL TARTRATE 45 MCG/ACT IN AERO: INHALATION_SPRAY | RESPIRATORY_TRACT | 1 refills | Status: DC

## 2023-02-07 NOTE — Assessment & Plan Note (Signed)
Suspect most of her respiratory symptoms are more asthma with emphysema.  PFT showed no significant airflow obstruction or restriction.  CT chest does show mild emphysematous changes.  Would continue on current regimen with triple therapy maintenance inhaler.  Asthma action plan discussed.  Control for triggers.  Encouraged on smoking cessation.  Plan  Patient Instructions  Continue on Breztri 2 puffs Twice daily  , rinse after use.  Xopenex  inhaler As needed   Continue Oxygen 2l/m At bedtime   Activity as tolerated Work on not smoking  2 D echo pending . Will call with results .  Follow up in 3 months with  Dr. Aldean Ast and As needed   Please contact office for sooner follow up if symptoms do not improve or worsen or seek emergency care   '

## 2023-02-07 NOTE — Progress Notes (Addendum)
@Patient  ID: Sarah Reeves, female    DOB: Jan 11, 1961, 62 y.o.   MRN: [id number]  Chief Complaint  Patient presents with   Follow-up    Referring provider: Marcelle Overlie, MD  HPI: 62 year old female active smoker seen for sleep consult September 04, 2022 for snoring, daytime sleepiness and restless sleep-negative sleep study for sleep apnea.  Found to have nocturnal hypoxemia on overnight oximetry test-started on O2 At bedtime  .   TEST/EVENTS :   home sleep study that was done on December 03, 2022, no significant sleep apnea with AHI at 3.4/hour and SpO2 low at 77%.  Average O2 saturations were 84%   Spirometry test in November 2023 showed minimal airflow obstruction.   LDCT chest January 02, 2023 shows no suspicious pulmonary nodules.  Mild bronchial wall thickening with mild emphysema.  Scattered areas of scarring in the lung bases.  12/2022 ONO shows nocturnal hypoxemia , O2 sats <88%- 6hr , avg 84%, low sats 77% . -started on O2 2l/m At bedtime    Coronary CT chest January 17, 2023 showed coronary calcium score at 0, noncalcified plaque in ostium of the large D1 branch causing moderate stenosis 50 to 69% minimal obstruction in the LAD and RCA, CT FFR suggest nonobstructive coronary disease, LAD: CTFFR 0.90 across lesion in ostial D1, suggesting lesion is not functionally significant   02/07/2023 Follow up: COPD/Asthma , Nocturnal hypoxia  Patient presents for a 35-month follow-up.  Patient was initially seen for sleep consult for snoring and daytime sleepiness.  Home sleep study on December 03, 2022 showed no significant sleep apnea the AHI at 3.4/hour and SpO2 low at 77%.  Average O2 saturations were 84%.  We set patient up for an overnight oximetry test that showed nocturnal hypoxemia with O2 saturations less than 88% majority of the night and SpO2 low at 77%.  Patient was started on oxygen at 2 L.  Last visit patient was suspected to have underlying COPD as she is an active smoker.   Spirometry showed minimal airflow obstruction.  She was set up for pulmonary function testing that was done today that showed no airflow obstruction with a normal FEV1 at 93%, ratio 75, FVC 96%, no significant bronchodilator response, DLCO 80%.  Patient was referred to the lung cancer CT screening program.  CT chest on January 02, 2023 showed no suspicious pulmonary nodules.  Mild bronchial wall thickening with mild emphysema.  Scattered areas of scarring in the lung bases. Patient was continued on Breztri inhaler twice daily.  Patient does feel that this helps at times.  Occasionally uses her Xopenex inhaler.  She says she is unsure if her oxygen at bedtime is helping at all.  She says she still feels tired during the daytime.  Patient does continue to smoke.  We discussed smoking cessation.  She was set up for a 2D echo which is still pending.  She has been following up with cardiology and had a coronary CT that showed coronary calcium score is 0 and suggested nonobstructive coronary disease.  Patient denies any significant cough or wheezing.  Walk test today in the office shows no desaturations on room air.  O2 saturations maintained 95 to 97% with ambulation on room air      Allergies  Allergen Reactions   Keflex [Cephalexin] Hives, Itching and Other (See Comments)    Tongue and throat itching/tingling   Oysters [Shellfish Allergy] Other (See Comments)    Unknown reaction; "MD told me I was allergic"  Vancomycin Anaphylaxis    Face and tongue swells   Zoloft [Sertraline Hcl] Other (See Comments)    REACTION: Nightmares, Grind teeth really bad   Latex Itching    Itch and blisters from contact   Amitriptyline     Hot, itching, tongue tingling, breakthrough bleeding.   Ciprofloxacin Other (See Comments)   Clindamycin Other (See Comments)   Clindamycin/Lincomycin Hives and Other (See Comments)    Stated that this causes her to get really horse and made her feel really hot   Other Other (See  Comments)   Penicillins    Pentosan Polysulfate Sodium Swelling    Throat swelling   Shellfish-Derived Products Other (See Comments)   Sulfa Antibiotics Other (See Comments)   Topamax [Topiramate]     Says it makes her "constipation and she can't urinate normally"   Uribel [Meth-Hyo-M Bl-Na Phos-Ph Sal] Nausea Only   Uribel [Urelle] Swelling and Other (See Comments)    Patient stated this makes her stomach swell and makes her feel si   Lamotrigine Rash   Sulfamethoxazole-Trimethoprim Rash    Patient developed feelings of dizziness, rash and itchy hands.  Patient then vomited and had diarrhea.    Immunization History  Administered Date(s) Administered   Influenza,inj,Quad PF,6+ Mos 06/27/2018, 07/07/2019   Pneumococcal Polysaccharide-23 06/27/2018   Td 10/21/1998   Td (Adult),5 Lf Tetanus Toxid, Preservative Free 10/21/1998   Unspecified SARS-COV-2 Vaccination 01/10/2020, 02/07/2020   Zoster Recombinat (Shingrix) 07/14/2019, 09/24/2019    Past Medical History:  Diagnosis Date   Angio-edema    Anxiety    Arthritis    Asthma    Clostridioides difficile infection    Complication of anesthesia    takes alot to sedated   COPD (chronic obstructive pulmonary disease)    Depression    Fibromyalgia    GERD (gastroesophageal reflux disease)    Hepatitis C    (Dr. Kinnie Scales) Treated with Harvoni March-May 2016   Herpes simplex    Hyperlipemia    Insomnia    Mental disorder    Neuropathy    PONV (postoperative nausea and vomiting)    Sleep apnea    Urticaria     Tobacco History: Social History   Tobacco Use  Smoking Status Every Day   Packs/day: 1.00   Years: 47.00   Additional pack years: 0.00   Total pack years: 47.00   Types: Cigarettes   Passive exposure: Current  Smokeless Tobacco Never  Tobacco Comments   Smoking here and there, not like she was.  02/06/2026 hfb   Ready to quit: Not Answered Counseling given: Not Answered Tobacco comments: Smoking here and  there, not like she was.  02/06/2026 hfb   Outpatient Medications Prior to Visit  Medication Sig Dispense Refill   Ascorbic Acid (VITAMIN C) 1000 MG tablet Take 1,000 mg by mouth daily.     aspirin EC 81 MG tablet Take 1 tablet (81 mg total) by mouth daily. 90 tablet 3   cetirizine (ZYRTEC) 10 MG tablet Take 1 tablet (10 mg total) by mouth daily. 30 tablet 5   chlorpheniramine-HYDROcodone (TUSSIONEX) 10-8 MG/5ML SUER      cholecalciferol (VITAMIN D) 1000 UNITS tablet Take 2,000 Units by mouth daily.      clotrimazole (MYCELEX) 10 MG troche Take 1 tablet (10 mg total) by mouth 5 (five) times daily. 70 tablet 0   cyclobenzaprine (FLEXERIL) 10 MG tablet Take 10 mg by mouth 3 (three) times daily as needed for muscle spasms.  Dextromethorphan-guaiFENesin (TUSSIN DM) 10-100 MG/5ML liquid Take 5 mLs by mouth every 12 (twelve) hours. 200 mL 0   diazepam (VALIUM) 5 MG tablet Take 1 tablet (5 mg total) by mouth 2 (two) times daily. 10 tablet 0   estradiol (VIVELLE-DOT) 0.075 MG/24HR Place onto the skin.     fidaxomicin (DIFICID) 200 MG TABS tablet Take 1 tablet (200 mg total) by mouth 2 (two) times daily. 20 tablet 0   gabapentin (NEURONTIN) 300 MG capsule Take 300 mg by mouth daily as needed (nerve pain).     levalbuterol (XOPENEX) 1.25 MG/3ML nebulizer solution Take 1.25 mg by nebulization every 4 (four) hours as needed for wheezing. 72 mL 5   levofloxacin (LEVAQUIN) 500 MG tablet Take 1 tablet (500 mg total) by mouth daily. 7 tablet 0   LORazepam (ATIVAN) 2 MG tablet Take 2 mg by mouth in the morning, at noon, and at bedtime.     meclizine (ANTIVERT) 25 MG tablet Take 25 mg by mouth 3 (three) times daily as needed for dizziness.     metoprolol tartrate (LOPRESSOR) 100 MG tablet Take 1 tab 2 hour prior to procedure 1 tablet 0   montelukast (SINGULAIR) 10 MG tablet Take 1 tablet (10 mg total) by mouth at bedtime. 30 tablet 5   nystatin (MYCOSTATIN) 100000 UNIT/ML suspension Take 5 mL swish and  swallow by mouth 4 times a day for 5 days 60 mL 0   olmesartan (BENICAR) 20 MG tablet Take 1 tablet (20 mg total) by mouth daily. 90 tablet 3   pantoprazole (PROTONIX) 40 MG tablet Take 1 tablet (40 mg total) by mouth daily. 90 tablet 1   potassium chloride (KLOR-CON) 10 MEQ tablet Take 1 tablet (10 mEq total) by mouth daily. 30 tablet 3   progesterone (PROMETRIUM) 100 MG capsule Take 200 mg by mouth at bedtime.     rosuvastatin (CRESTOR) 10 MG tablet Take 1 tablet (10 mg total) by mouth daily. 90 tablet 3   valACYclovir (VALTREX) 500 MG tablet Take 500 mg by mouth daily as needed (outbreaks).     Vitamin D, Cholecalciferol, 25 MCG (1000 UT) TABS Take 25 mcg by mouth daily. 60 tablet 3   zolpidem (AMBIEN) 10 MG tablet Take 10 mg by mouth at bedtime.     Budeson-Glycopyrrol-Formoterol (BREZTRI AEROSPHERE) 160-9-4.8 MCG/ACT AERO Inhale 2 puffs into the lungs in the morning and at bedtime. Alternates between symbicort and Breztri     levalbuterol (XOPENEX HFA) 45 MCG/ACT inhaler INHALE 2 PUFFS INTO THE LUNGS EVERY 4 HOURS AS NEEDED FOR WHEEZING 15 g 1   SYMBICORT 160-4.5 MCG/ACT inhaler Inhale 2 puffs into the lungs 2 (two) times daily. 1 each 5   nitroGLYCERIN (NITROSTAT) 0.4 MG SL tablet Place 1 tablet (0.4 mg total) under the tongue every 5 (five) minutes as needed for chest pain. 25 tablet 2   Facility-Administered Medications Prior to Visit  Medication Dose Route Frequency Provider Last Rate Last Admin   chlorhexidine (HIBICLENS) 4 % liquid 1 application  1 application  Topical Once Chevis Pretty III, MD       chlorhexidine (HIBICLENS) 4 % liquid 1 application  1 application  Topical Once Chevis Pretty III, MD         Review of Systems:   Constitutional:   No  weight loss, night sweats,  Fevers, chills,  +fatigue, or  lassitude.  HEENT:   No headaches,  Difficulty swallowing,  Tooth/dental problems, or  Sore throat,  No sneezing, itching, ear ache, nasal congestion, post nasal  drip,   CV:  No chest pain,  Orthopnea, PND, swelling in lower extremities, anasarca, dizziness, palpitations, syncope.   GI  No heartburn, indigestion, abdominal pain, nausea, vomiting, diarrhea, change in bowel habits, loss of appetite, bloody stools.   Resp:  No chest wall deformity  Skin: no rash or lesions.  GU: no dysuria, change in color of urine, no urgency or frequency.  No flank pain, no hematuria   MS:  No joint pain or swelling.  No decreased range of motion.  No back pain.    Physical Exam  BP 104/70 (BP Location: Left Arm, Patient Position: Sitting, Cuff Size: Normal)   Pulse (!) 107   Temp 98.4 F (36.9 C) (Oral)   Ht 5\' 4"  (1.626 m)   Wt 165 lb 6.4 oz (75 kg)   BMI 28.39 kg/m   GEN: A/Ox3; pleasant , NAD   HEENT:  [place]/AT,   NOSE-clear, THROAT-clear, no lesions, no postnasal drip or exudate noted.   NECK:  Supple w/ fair ROM; no JVD; normal carotid impulses w/o bruits; no thyromegaly or nodules palpated; no lymphadenopathy.    RESP  Clear  P & A; w/o, wheezes/ rales/ or rhonchi. no accessory muscle use, no dullness to percussion  CARD:  RRR, no m/r/g, no peripheral edema, pulses intact, no cyanosis or clubbing.  GI:   Soft & nt; nml bowel sounds; no organomegaly or masses detected.   Musco: Warm bil, no deformities or joint swelling noted.   Neuro: alert, no focal deficits noted.    Skin: Warm, no lesions or rashes    Lab Results:    BNP   ProBNP No results found for: "PROBNP"  Imaging: CT CORONARY MORPH W/CTA COR W/SCORE W/CA W/CM &/OR WO/CM  Addendum Date: 01/19/2023   ADDENDUM REPORT: 01/19/2023 23:09 EXAM: OVER-READ INTERPRETATION  CT CHEST The following report is an over-read performed by radiologist Dr. Arnoldo Morale [facility] on 01/19/2023. This over-read does not include interpretation of cardiac or coronary anatomy or pathology. The coronary CTA interpretation by the cardiologist is attached. COMPARISON:  Chest CT  01/02/2023. FINDINGS: Mild emphysema. Bilateral breast implants. IMPRESSION: No significant extracardiac findings. Electronically Signed   By: Elgie Collard M.D.   On: 01/19/2023 23:09   Result Date: 01/19/2023 CLINICAL DATA:  21F with chest pain EXAM: Cardiac/Coronary CTA TECHNIQUE: The patient was scanned on a [employer]. FINDINGS: A 100 kV prospective scan was triggered in the descending thoracic aorta at 111 HU's. Axial non-contrast 3 mm slices were carried out through the heart. The data set was analyzed on a dedicated work station and scored using the Agatson method. Gantry rotation speed was 250 msecs and collimation was .6 mm. No beta blockade and 0.8 mg of sl NTG was given. The 3D data set was reconstructed in 5% intervals of the 35-75% of the R-R cycle. Phases were analyzed on a dedicated work station using MPR, MIP and VRT modes. The patient received 100 cc of contrast. Coronary Arteries:  Normal coronary origin.  Right dominance. RCA is a large dominant artery that gives rise to PDA and PLA. Noncalcified plaque in proximal RCA causes 0-24% stenosis Left main is a large artery that gives rise to LAD and LCX arteries. LAD is a large vessel. Noncalcified plaque in proximal LAD causes 0-24% stenosis. Noncalcified plaque in ostium of large D1 branch causes 50-69% stenosis LCX is a non-dominant artery that gives rise to  one large OM1 branch. There is no plaque. Other findings: Left Ventricle: Normal size Left Atrium: Normal size Pulmonary Veins: Normal configuration Right Ventricle: Normal size Right Atrium: Normal size Cardiac valves: No calcifications Thoracic aorta: Normal size Pulmonary Arteries: Normal size Systemic Veins: Normal drainage Pericardium: Normal thickness IMPRESSION: 1. Coronary calcium score of 0. 2. Total plaque volume 54 mm3 which is 52nd percentile for age- and sex-matched controls (calcified plaque 0 mm3; noncalcified plaque 54 mm3). Total plaque volume is mild 3. Normal  coronary origin with right dominance. 4. Noncalcified plaque in ostium of large D1 branch causes moderate (50-69%) stenosis 5. Noncalcified plaque in proximal LAD causes minimal (0-24%) stenosis. 6. Noncalcified plaque in proximal RCA causes minimal (0-24%) stenosis 7. Will send study for CTFFR CAD-RADS 3. Moderate stenosis. Consider symptom-guided anti-ischemic pharmacotherapy as well as risk factor modification per guideline directed care. Additional analysis with CT FFR will be submitted. Electronically Signed: By: Epifanio Lesches M.D. On: 01/17/2023 16:19   CT CORONARY FFR DATA PREP & FLUID ANALYSIS  Result Date: 01/17/2023 EXAM: FFRCT ANALYSIS FINDINGS: FFRct analysis was performed on the original cardiac CT angiogram dataset. Diagrammatic representation of the FFRct analysis is provided in a separate PDF document in PACS. This dictation was created using the PDF document and an interactive 3D model of the results. 3D model is not available in the EMR/PACS. Normal FFR range is >0.80. 1. Left Main: No significant stenosis 2. LAD: CTFFR 0.90 across lesion in ostial D1, suggesting lesion is not functionally significant 3. LCX: No significant stenosis 4. RCA:No significant stenosis IMPRESSION: 1.  CTFFR suggests nonobstructive CAD Electronically Signed   By: Epifanio Lesches M.D.   On: 01/17/2023 16:22         Latest Ref Rng & Units 02/07/2023    9:54 AM  PFT Results  FVC-Pre L 3.23  P  FVC-Predicted Pre % 97  P  FVC-Post L 3.20  P  FVC-Predicted Post % 96  P  Pre FEV1/FVC % % 73  P  Post FEV1/FCV % % 75  P  FEV1-Pre L 2.34  P  FEV1-Predicted Pre % 91  P  FEV1-Post L 2.40  P  DLCO uncorrected ml/min/mmHg 16.29  P  DLCO UNC% % 80  P  DLCO corrected ml/min/mmHg 16.29  P  DLCO COR %Predicted % 80  P  DLVA Predicted % 74  P  TLC L 5.14  P  TLC % Predicted % 101  P  RV % Predicted % 84  P    P Preliminary result    No results found for: "NITRICOXIDE"      Assessment &  Plan:   Asthma-COPD overlap syndrome (HCC) Suspect most of her respiratory symptoms are more asthma with emphysema.  PFT showed no significant airflow obstruction or restriction.  CT chest does show mild emphysematous changes.  Would continue on current regimen with triple therapy maintenance inhaler.  Asthma action plan discussed.  Control for triggers.  Encouraged on smoking cessation.  Plan  Patient Instructions  Continue on Breztri 2 puffs Twice daily  , rinse after use.  Xopenex  inhaler As needed   Continue Oxygen 2l/m At bedtime   Activity as tolerated Work on not smoking  2 D echo pending . Will call with results .  Follow up in 3 months with  Dr. Aldean Ast and As needed   Please contact office for sooner follow up if symptoms do not improve or worsen or seek emergency care   '  Tobacco use Smoking cessation discussed in detail.  Chronic respiratory failure with hypoxia Nocturnal hypoxemia questionable etiology.  Overnight oximetry test shows nocturnal hypoxemia.  Home sleep study was negative for sleep apnea.  PFTs show minimal airflow obstruction or restriction and normal diffusing capacity. She does have some mild emphysema.  Is on some sedating medications which could contribute to some hypoventilation. Could consider setting up for an in lab sleep study to further evaluate -to make sure there is definitely no sleep apnea component.  Cardiac workup unrevealing with coronary CT showing nonobstructive coronary disease.  2D echo is pending. For now we will continue on oxygen 2 L at bedtime.  Maximize treatment for asthma.  Plan  Patient Instructions  Continue on Breztri 2 puffs Twice daily  , rinse after use.  Xopenex  inhaler As needed   Continue Oxygen 2l/m At bedtime   Activity as tolerated Work on not smoking  2 D echo pending . Will call with results .  Follow up in 3 months with  Dr. Aldean Ast and As needed   Please contact office for sooner follow up if symptoms do  not improve or worsen or seek emergency care   '     Eleonor Ocon, NP 02/07/2023

## 2023-02-07 NOTE — Progress Notes (Signed)
Full PFT performed today. °

## 2023-02-07 NOTE — Patient Instructions (Signed)
Full PFT performed today. °

## 2023-02-07 NOTE — Assessment & Plan Note (Signed)
Smoking cessation discussed in detail 

## 2023-02-07 NOTE — Patient Instructions (Addendum)
Continue on Breztri 2 puffs Twice daily  , rinse after use.  Xopenex  inhaler As needed   Continue Oxygen 2l/m At bedtime   Activity as tolerated Work on not smoking  2 D echo pending . Will call with results .  Follow up in 3 months with  Dr. Aldean Ast and As needed   Please contact office for sooner follow up if symptoms do not improve or worsen or seek emergency care

## 2023-02-07 NOTE — Assessment & Plan Note (Signed)
Nocturnal hypoxemia questionable etiology.  Overnight oximetry test shows nocturnal hypoxemia.  Home sleep study was negative for sleep apnea.  PFTs show minimal airflow obstruction or restriction and normal diffusing capacity. She does have some mild emphysema.  Is on some sedating medications which could contribute to some hypoventilation. Could consider setting up for an in lab sleep study to further evaluate -to make sure there is definitely no sleep apnea component.  Cardiac workup unrevealing with coronary CT showing nonobstructive coronary disease.  2D echo is pending. For now we will continue on oxygen 2 L at bedtime.  Maximize treatment for asthma.  Plan  Patient Instructions  Continue on Breztri 2 puffs Twice daily  , rinse after use.  Xopenex  inhaler As needed   Continue Oxygen 2l/m At bedtime   Activity as tolerated Work on not smoking  2 D echo pending . Will call with results .  Follow up in 3 months with  Dr. Aldean Ast and As needed   Please contact office for sooner follow up if symptoms do not improve or worsen or seek emergency care   '

## 2023-02-14 ENCOUNTER — Telehealth: Payer: Self-pay | Admitting: Adult Health

## 2023-02-14 NOTE — Telephone Encounter (Signed)
Spoke with pt and reviewed the recommendations as dictated by Tammy. Pt stated understanding. Nothing further needed at this time.

## 2023-02-14 NOTE — Telephone Encounter (Signed)
Called and spoke with patient. She stated that she was told during her last breathing test to "blow the albuterol out through her nose". She just realized it she has been doing this with her Markus Daft as well since the test (4/19). Since doing this, she believes she may the beginning of a sinus infection.   She has been having a terrible headache around her temples and a stuffy nose. She has not been able to blow any discharge from her nose. Denied any body aches or fevers. She did mention that she had a truckload of mulch delivered earlier this week and wonders if her symptoms may be allergy related.   She also wanted to know if it was possible for her to get a sinus yeast infection from the [place] from blowing it out of her nose.   TP, can you please advise? Thanks!

## 2023-02-14 NOTE — Telephone Encounter (Signed)
Patient requesting to speak with a nurse regarding her inhaler- states she blew it out of her nose instead of her mouth and thinks she may have a yeast infection in her sinuses. Please advise.

## 2023-02-14 NOTE — Telephone Encounter (Signed)
Typically not , Stop exhaling through nose if it gives her side effects.  We use nasal steroids on regular basis without typical issues.   Use  saline nasal rinses Twice daily  , saline nasal gel At bedtime    Can use Zyrtec once daily As needed    Please contact office for sooner follow up if symptoms do not improve or worsen or seek emergency care

## 2023-02-17 ENCOUNTER — Ambulatory Visit: Payer: BC Managed Care – PPO | Admitting: Gastroenterology

## 2023-02-18 ENCOUNTER — Ambulatory Visit: Payer: BC Managed Care – PPO | Attending: Nurse Practitioner | Admitting: Nurse Practitioner

## 2023-02-18 ENCOUNTER — Encounter: Payer: Self-pay | Admitting: Nurse Practitioner

## 2023-02-18 VITALS — BP 124/78 | HR 98 | Ht 64.0 in | Wt 165.0 lb

## 2023-02-18 DIAGNOSIS — J4489 Other specified chronic obstructive pulmonary disease: Secondary | ICD-10-CM

## 2023-02-18 DIAGNOSIS — I10 Essential (primary) hypertension: Secondary | ICD-10-CM | POA: Diagnosis not present

## 2023-02-18 DIAGNOSIS — Z720 Tobacco use: Secondary | ICD-10-CM

## 2023-02-18 DIAGNOSIS — I25118 Atherosclerotic heart disease of native coronary artery with other forms of angina pectoris: Secondary | ICD-10-CM | POA: Diagnosis not present

## 2023-02-18 DIAGNOSIS — E785 Hyperlipidemia, unspecified: Secondary | ICD-10-CM | POA: Diagnosis not present

## 2023-02-18 DIAGNOSIS — F411 Generalized anxiety disorder: Secondary | ICD-10-CM

## 2023-02-18 MED ORDER — NITROGLYCERIN 0.4 MG SL SUBL: 0.4000 mg | SUBLINGUAL_TABLET | SUBLINGUAL | 2 refills | Status: DC | PRN

## 2023-02-18 NOTE — Patient Instructions (Signed)
Medication Instructions:  Please start Crestor 10 mg as directed  *If you need a refill on your cardiac medications before your next appointment, please call your pharmacy*   Lab Work: NONE ordered at this time of appointment   If you have labs (blood work) drawn today and your tests are completely normal, you will receive your results only by: MyChart Message (if you have MyChart) OR A paper copy in the mail If you have any lab test that is abnormal or we need to change your treatment, we will call you to review the results.   Testing/Procedures: NONE ordered at this time of appointment     Follow-Up: At [facility], you and your health needs are our priority.  As part of our continuing mission to provide you with exceptional heart care, we have created designated Provider Care Teams.  These Care Teams include your primary Cardiologist (physician) and Advanced Practice Providers (APPs -  Physician Assistants and Nurse Practitioners) who all work together to provide you with the care you need, when you need it.  We recommend signing up for the patient portal called "MyChart".  Sign up information is provided on this After Visit Summary.  MyChart is used to connect with patients for Virtual Visits (Telemedicine).  Patients are able to view lab/test results, encounter notes, upcoming appointments, etc.  Non-urgent messages can be sent to your provider as well.   To learn more about what you can do with MyChart, go to [email or web address].    Your next appointment:   6-8 weeks  Provider:   Bernadene Person NP     Other Instructions

## 2023-02-18 NOTE — Progress Notes (Signed)
Office Visit    Patient Name: Sarah Reeves Date of Encounter: 02/18/2023  Primary Care Provider:  Marcelle Overlie, MD Primary Cardiologist:  Bryan Lemma, MD  Chief Complaint    62 year old female with a history of nonobstructive CAD, chronic chest pain, hypertension, COPD, tobacco use, hepatitis, and chronic anxiety who presents for follow-up related to CAD and chest pain.   Past Medical History    Past Medical History:  Diagnosis Date   Angio-edema    Anxiety    Arthritis    Asthma    Clostridioides difficile infection    Complication of anesthesia    takes alot to sedated   COPD (chronic obstructive pulmonary disease) (HCC)    Depression    Fibromyalgia    GERD (gastroesophageal reflux disease)    Hepatitis C    (Dr. Kinnie Scales) Treated with Harvoni March-May 2016   Herpes simplex    Hyperlipemia    Insomnia    Mental disorder    Neuropathy    PONV (postoperative nausea and vomiting)    Sleep apnea    Urticaria    Past Surgical History:  Procedure Laterality Date   BREAST ENHANCEMENT SURGERY     BREAST LUMPECTOMY WITH RADIOACTIVE SEED LOCALIZATION Left 12/26/2014   Procedure: LEFT BREAST LUMPECTOMY WITH RADIOACTIVE SEED LOCALIZATION;  Surgeon: Chevis Pretty III, MD;  Location: [facility];  Service: General;  Laterality: Left;   BREAST REDUCTION SURGERY     COLONOSCOPY     EXPLORATORY LAPAROTOMY     NM MYOVIEW LTD  07/07/15   Normal Myocardial Perfusion Scan. Low risk lexiscan nuclear study with minimal insignificant breast attenuation and normal myocardial perfusion and function; EF 53% without wall motion abnormalities and normal systolic thickening   TEMPOROMANDIBULAR JOINT SURGERY     TUBAL LIGATION     UPPER GI ENDOSCOPY     x5    Allergies  Allergies  Allergen Reactions   Keflex [Cephalexin] Hives, Itching and Other (See Comments)    Tongue and throat itching/tingling   Oysters [Shellfish Allergy] Other (See Comments)    Unknown  reaction; "MD told me I was allergic"   Vancomycin Anaphylaxis    Face and tongue swells   Zoloft [Sertraline Hcl] Other (See Comments)    REACTION: Nightmares, Grind teeth really bad   Latex Itching    Itch and blisters from contact   Amitriptyline     Hot, itching, tongue tingling, breakthrough bleeding.   Ciprofloxacin Other (See Comments)   Clindamycin Other (See Comments)   Clindamycin/Lincomycin Hives and Other (See Comments)    Stated that this causes her to get really horse and made her feel really hot   Other Other (See Comments)   Penicillins    Pentosan Polysulfate Sodium Swelling    Throat swelling   Shellfish-Derived Products Other (See Comments)   Sulfa Antibiotics Other (See Comments)   Topamax [Topiramate]     Says it makes her "constipation and she can't urinate normally"   Uribel [Meth-Hyo-M Bl-Na Phos-Ph Sal] Nausea Only   Uribel [Urelle] Swelling and Other (See Comments)    Patient stated this makes her stomach swell and makes her feel si   Lamotrigine Rash   Sulfamethoxazole-Trimethoprim Rash    Patient developed feelings of dizziness, rash and itchy hands.  Patient then vomited and had diarrhea.     Labs/Other Studies Reviewed    The following studies were reviewed today: Lexiscan myoview 06/2015: Nuclear stress EF: 53%. The left ventricular ejection  fraction is mildly decreased (45-54%). There was no ST segment deviation noted during stress. Normal myocardial perfusion and function.   Low risk lexiscan nuclear study with minimal insignificant breast attenuation and normal myocardial perfusion and function; EF 53% without wall motion abnormalities and normal systolic thickening.   CCTA 12/2022:    EXAM: Cardiac/Coronary CTA   TECHNIQUE: The patient was scanned on a [employer].   FINDINGS: A 100 kV prospective scan was triggered in the descending thoracic aorta at 111 HU's. Axial non-contrast 3 mm slices were carried out through the  heart. The data set was analyzed on a dedicated work station and scored using the Agatson method. Gantry rotation speed was 250 msecs and collimation was .6 mm. No beta blockade and 0.8 mg of sl NTG was given. The 3D data set was reconstructed in 5% intervals of the 35-75% of the R-R cycle. Phases were analyzed on a dedicated work station using MPR, MIP and VRT modes. The patient received 100 cc of contrast.   Coronary Arteries:  Normal coronary origin.  Right dominance.   RCA is a large dominant artery that gives rise to PDA and PLA. Noncalcified plaque in proximal RCA causes 0-24% stenosis   Left main is a large artery that gives rise to LAD and LCX arteries.   LAD is a large vessel. Noncalcified plaque in proximal LAD causes 0-24% stenosis. Noncalcified plaque in ostium of large D1 branch causes 50-69% stenosis   LCX is a non-dominant artery that gives rise to one large OM1 branch. There is no plaque.   Other findings:   Left Ventricle: Normal size   Left Atrium: Normal size   Pulmonary Veins: Normal configuration   Right Ventricle: Normal size   Right Atrium: Normal size   Cardiac valves: No calcifications   Thoracic aorta: Normal size   Pulmonary Arteries: Normal size   Systemic Veins: Normal drainage   Pericardium: Normal thickness   IMPRESSION: 1. Coronary calcium score of 0.   2. Total plaque volume 54 mm3 which is 52nd percentile for age- and sex-matched controls (calcified plaque 0 mm3; noncalcified plaque 54 mm3). Total plaque volume is mild   3. Normal coronary origin with right dominance.   4. Noncalcified plaque in ostium of large D1 branch causes moderate (50-69%) stenosis   5. Noncalcified plaque in proximal LAD causes minimal (0-24%) stenosis.   6. Noncalcified plaque in proximal RCA causes minimal (0-24%) stenosis   7. Will send study for CTFFR   CAD-RADS 3. Moderate stenosis. Consider symptom-guided anti-ischemic pharmacotherapy as  well as risk factor modification per guideline directed care. Additional analysis with CT FFR will be submitted.  EXAM: FFRCT ANALYSIS   FINDINGS: FFRct analysis was performed on the original cardiac CT angiogram dataset. Diagrammatic representation of the FFRct analysis is provided in a separate PDF document in PACS. This dictation was created using the PDF document and an interactive 3D model of the results. 3D model is not available in the EMR/PACS. Normal FFR range is >0.80.   1. Left Main: No significant stenosis   2. LAD: CTFFR 0.90 across lesion in ostial D1, suggesting lesion is not functionally significant   3. LCX: No significant stenosis   4. RCA:No significant stenosis   IMPRESSION: 1.  CTFFR suggests nonobstructive CAD. Recent Labs: 11/08/2022: Hemoglobin 14.0; Platelets 206; TSH 2.047 01/24/2023: ALT 27; BUN 19; Creatinine, Ser 0.87; Potassium 4.6; Sodium 140  Recent Lipid Panel    Component Value Date/Time   CHOL 272 (  H) 01/24/2023 0843   TRIG 514 (H) 01/24/2023 0843   HDL 60 01/24/2023 0843   CHOLHDL 4.5 (H) 01/24/2023 0843   CHOLHDL 3.3 06/27/2018 0623   VLDL 23 06/27/2018 0623   LDLCALC 121 (H) 01/24/2023 0843    History of Present Illness    62 year old female with the above past medical history including nonobstructive CAD, chronic chest pain, hypertension, COPD, tobacco use, hepatitis, and chronic anxiety.  She has a history of chronic chest pain. Lexiscan in 2016 was negative for ischemia. CT of the chest for lung cancer screening in 12/2022 revealed evidence of aortic atherosclerosis, coronary calcifications. She was last seen in the office on 01/10/2023 and noted intermittent chest discomfort, stable chronic dyspnea in the setting of COPD (follows with pulmonology).   Hydrochlorothiazide was discontinued in setting of chronic hypokalemia.  She was started olmesartan 20 mg daily.  Coronary CT angiogram revealed nonobstructive CAD. She is pending  echocardiogram per pulmonology.   She presents today for follow-up accompanied by her friend. Since her last visit she has been stable from a cardiac standpoint.  She denies any symptoms concerning for angina.  She has stable dyspnea in the setting of COPD.  She notes she has been under a significant amount of personal stress and has been drinking 4-5 drinks/day.  She continues to smoke.  She did not start taking her Crestor as she was concerned that this would elevate her blood sugar and increase her cortisol levels.  She has follow-up scheduled with endocrinology to discuss concerns possibly elevated cortisol level.  She also has follow-up scheduled with GI for ongoing concerns following C. difficile infection.  BP has been well-controlled.  Home Medications    Current Outpatient Medications  Medication Sig Dispense Refill   Ascorbic Acid (VITAMIN C) 1000 MG tablet Take 1,000 mg by mouth daily.     aspirin EC 81 MG tablet Take 1 tablet (81 mg total) by mouth daily. 90 tablet 3   Budeson-Glycopyrrol-Formoterol (BREZTRI AEROSPHERE) 160-9-4.8 MCG/ACT AERO Inhale 2 puffs into the lungs in the morning and at bedtime. Alternates between symbicort and Breztri 10.7 g 6   cetirizine (ZYRTEC) 10 MG tablet Take 1 tablet (10 mg total) by mouth daily. 30 tablet 5   chlorpheniramine-HYDROcodone (TUSSIONEX) 10-8 MG/5ML SUER      cholecalciferol (VITAMIN D) 1000 UNITS tablet Take 2,000 Units by mouth daily.      clotrimazole (MYCELEX) 10 MG troche Take 1 tablet (10 mg total) by mouth 5 (five) times daily. 70 tablet 0   cyclobenzaprine (FLEXERIL) 10 MG tablet Take 10 mg by mouth 3 (three) times daily as needed for muscle spasms.     Dextromethorphan-guaiFENesin (TUSSIN DM) 10-100 MG/5ML liquid Take 5 mLs by mouth every 12 (twelve) hours. 200 mL 0   diazepam (VALIUM) 5 MG tablet Take 1 tablet (5 mg total) by mouth 2 (two) times daily. 10 tablet 0   estradiol (VIVELLE-DOT) 0.075 MG/24HR Place onto the skin.      fidaxomicin (DIFICID) 200 MG TABS tablet Take 1 tablet (200 mg total) by mouth 2 (two) times daily. 20 tablet 0   gabapentin (NEURONTIN) 300 MG capsule Take 300 mg by mouth daily as needed (nerve pain).     levalbuterol (XOPENEX HFA) 45 MCG/ACT inhaler INHALE 2 PUFFS INTO THE LUNGS EVERY 4 HOURS AS NEEDED FOR WHEEZING 15 g 1   levalbuterol (XOPENEX) 1.25 MG/3ML nebulizer solution Take 1.25 mg by nebulization every 4 (four) hours as needed for wheezing. 72 mL  5   levofloxacin (LEVAQUIN) 500 MG tablet Take 1 tablet (500 mg total) by mouth daily. 7 tablet 0   LORazepam (ATIVAN) 2 MG tablet Take 2 mg by mouth in the morning, at noon, and at bedtime.     meclizine (ANTIVERT) 25 MG tablet Take 25 mg by mouth 3 (three) times daily as needed for dizziness.     montelukast (SINGULAIR) 10 MG tablet Take 1 tablet (10 mg total) by mouth at bedtime. 30 tablet 5   nystatin (MYCOSTATIN) 100000 UNIT/ML suspension Take 5 mL swish and swallow by mouth 4 times a day for 5 days 60 mL 0   olmesartan (BENICAR) 20 MG tablet Take 1 tablet (20 mg total) by mouth daily. 90 tablet 3   pantoprazole (PROTONIX) 40 MG tablet Take 1 tablet (40 mg total) by mouth daily. 90 tablet 1   potassium chloride (KLOR-CON) 10 MEQ tablet Take 1 tablet (10 mEq total) by mouth daily. 30 tablet 3   progesterone (PROMETRIUM) 100 MG capsule Take 200 mg by mouth at bedtime.     rosuvastatin (CRESTOR) 10 MG tablet Take 1 tablet (10 mg total) by mouth daily. 90 tablet 3   Vitamin D, Cholecalciferol, 25 MCG (1000 UT) TABS Take 25 mcg by mouth daily. 60 tablet 3   zolpidem (AMBIEN) 10 MG tablet Take 10 mg by mouth at bedtime.     metoprolol tartrate (LOPRESSOR) 100 MG tablet Take 1 tab 2 hour prior to procedure (Patient not taking: Reported on 02/18/2023) 1 tablet 0   nitroGLYCERIN (NITROSTAT) 0.4 MG SL tablet Place 1 tablet (0.4 mg total) under the tongue every 5 (five) minutes as needed for chest pain. 25 tablet 2   valACYclovir (VALTREX) 500 MG  tablet Take 500 mg by mouth daily as needed (outbreaks). (Patient not taking: Reported on 02/18/2023)     No current facility-administered medications for this visit.   Facility-Administered Medications Ordered in Other Visits  Medication Dose Route Frequency Provider Last Rate Last Admin   chlorhexidine (HIBICLENS) 4 % liquid 1 application  1 application  Topical Once Chevis Pretty III, MD       chlorhexidine (HIBICLENS) 4 % liquid 1 application  1 application  Topical Once Chevis Pretty III, MD         Review of Systems    She denies chest pain, palpitations, pnd, orthopnea, n, v, dizziness, syncope, edema, weight gain, or early satiety. All other systems reviewed and are otherwise negative except as noted above.   Physical Exam    VS:  BP 124/78   Pulse 98   Ht 5\' 4"  (1.626 m)   Wt 165 lb (74.8 kg)   SpO2 98%   BMI 28.32 kg/m  GEN: Well nourished, well developed, in no acute distress. HEENT: normal. Neck: Supple, no JVD, carotid bruits, or masses. Cardiac: RRR, no murmurs, rubs, or gallops. No clubbing, cyanosis, edema.  Radials/DP/PT 2+ and equal bilaterally.  Respiratory:  Respirations regular and unlabored, clear to auscultation bilaterally. GI: Soft, nontender, nondistended, BS + x 4. MS: no deformity or atrophy. Skin: warm and dry, no rash. Neuro:  Strength and sensation are intact. Psych: Normal affect.  Accessory Clinical Findings    ECG personally reviewed by me today - No EKG in office today.    Lab Results  Component Value Date   WBC 12.9 (H) 11/08/2022   HGB 14.0 11/08/2022   HCT 40.4 11/08/2022   MCV 95.1 11/08/2022   PLT 206 11/08/2022   Lab  Results  Component Value Date   CREATININE 0.87 01/24/2023   BUN 19 01/24/2023   NA 140 01/24/2023   K 4.6 01/24/2023   CL 103 01/24/2023   CO2 22 01/24/2023   Lab Results  Component Value Date   ALT 27 01/24/2023   AST 34 01/24/2023   ALKPHOS 71 01/24/2023   BILITOT 0.4 01/24/2023   Lab Results  Component  Value Date   CHOL 272 (H) 01/24/2023   HDL 60 01/24/2023   LDLCALC 121 (H) 01/24/2023   TRIG 514 (H) 01/24/2023   CHOLHDL 4.5 (H) 01/24/2023    Lab Results  Component Value Date   HGBA1C 6.0 (H) 03/10/2021    Assessment & Plan   1. CAD/chest pain: She has a longstanding history of intermittent chest discomfort.  Myoview in 2016 was low risk.  CT of the chest for lung cancer screening that showed evidence of aortic atherosclerosis, possible coronary calcifications.  CCTA in 12/2022 revealed nonobstructive CAD.  Initiate Crestor as below.   2. Hypertension: BP well controlled. Continue current antihypertensive regimen.    3. Hyperlipidemia: LDL was 121 in 01/2023.  Previously advised to start Crestor, however, she notes that she was afraid to start taking the medication as she thought it would elevate her blood sugar and increase her cortisol levels.  We again discussed indications for statin therapy.  She is agreeable to trial Crestor 10 mg daily.  Plan for fasting lipids, LFTs at next follow-up.  4. COPD/tobacco use: She continues to smoke.  Now on home O2 at night.  Following with pulmonology.  Full cessation advised.   5. Anxiety: Ongoing, stable.  Managed per PCP.   6. Disposition: Follow-up in 6 to 8 weeks.      Joylene Grapes, NP 02/18/2023, 10:16 AM

## 2023-02-19 ENCOUNTER — Telehealth: Payer: Self-pay | Admitting: Nurse Practitioner

## 2023-02-19 NOTE — Telephone Encounter (Signed)
Pt c/o swelling: STAT is pt has developed SOB within 24 hours  If swelling, where is the swelling located? Hands, upper stomach, and both ankles  How much weight have you gained and in what time span? 5 lbs over a couple of weeks.   Have you gained 3 pounds in a day or 5 pounds in a week? no  Do you have a log of your daily weights (if so, list)? no  Are you currently taking a fluid pill? no  Are you currently SOB? no  Have you traveled recently? No Patient states she saw [facility] yesterday and did mention this to her.  Patient feels she needs some lasix.

## 2023-02-19 NOTE — Telephone Encounter (Signed)
Patient states her hands and stomach seem like she has gained some weight.  She states having taken Imodium  due to C-Diff and not sure if this has anything to do with it. Patient states a week or to ago she was 160 and now 165.  Looked back and she has been 165 since March. She does ask if she needs to increase water, advised to replenish what she is losing and drink Gatorade or Pedialyte which she states she prefers .  She will ensure she is hydrating but will not overload.  She will keep an ey on her weights and call if any changes.

## 2023-02-20 ENCOUNTER — Ambulatory Visit
Admission: RE | Admit: 2023-02-20 | Discharge: 2023-02-20 | Disposition: A | Discharge status: Home / Self Care | Payer: BC Managed Care – PPO | Source: Ambulatory Visit | Attending: Obstetrics and Gynecology | Admitting: Obstetrics and Gynecology

## 2023-02-20 ENCOUNTER — Other Ambulatory Visit: Payer: Self-pay | Admitting: Obstetrics and Gynecology

## 2023-02-20 DIAGNOSIS — N644 Mastodynia: Secondary | ICD-10-CM | POA: Diagnosis not present

## 2023-02-20 DIAGNOSIS — G4736 Sleep related hypoventilation in conditions classified elsewhere: Secondary | ICD-10-CM | POA: Diagnosis not present

## 2023-02-20 DIAGNOSIS — Z1231 Encounter for screening mammogram for malignant neoplasm of breast: Secondary | ICD-10-CM | POA: Diagnosis not present

## 2023-02-20 DIAGNOSIS — J449 Chronic obstructive pulmonary disease, unspecified: Secondary | ICD-10-CM | POA: Diagnosis not present

## 2023-02-25 ENCOUNTER — Telehealth: Payer: Self-pay | Admitting: Adult Health

## 2023-02-25 NOTE — Telephone Encounter (Signed)
Patient states Echo covered from 02/14/2023 thru 03/15/2023. Patient phone number is [phone].

## 2023-02-26 ENCOUNTER — Telehealth: Payer: Self-pay | Admitting: Cardiology

## 2023-02-26 DIAGNOSIS — R609 Edema, unspecified: Secondary | ICD-10-CM | POA: Diagnosis not present

## 2023-02-26 DIAGNOSIS — R0789 Other chest pain: Secondary | ICD-10-CM | POA: Diagnosis not present

## 2023-02-26 NOTE — Telephone Encounter (Signed)
See previous encounter.  Patient is following up stating that her left side is swollen and tender. She states they assume it is due to a kidney infection which may have been caused by vitamin B1 or B12. She states she plans to go have lab for drawn tomorrow morning.

## 2023-02-26 NOTE — Telephone Encounter (Signed)
Spoke with patient. She said EMS told her she may have a kidney infection -- thinks it could be from crestor or vitamin b1 or vitamin b12. She plans to go to [facility] [place] early tomorrow morning to an eval. EMS told her that her vitals were fine and she didn't need to be evaluated today. She said EMS told her to call back if she had any new/worsening symptoms and they would take her to ED. Advised that she should go to ED given crushing chest pain, throat/neck pain, mouth pain (as noted in previous message) but she plans to follow EMS advice. Will send to MD

## 2023-02-26 NOTE — Telephone Encounter (Signed)
Jeanice Lim can you update this auth the Echo is scheduled 05/30 please and thank you

## 2023-02-26 NOTE — Telephone Encounter (Signed)
Spoke with patient of Dr. Herbie Baltimore -- she just started crestor as advised by Irving Burton NP -- appears what initial message was about but she has other concerning symptoms. She has since been having pain in teeth, mouth, stomach, headache. She reports a pain, like a bump on her left side. Last night she had a chest tightness, neck/throat pain like a vice was on it, and then "crushing crushing crushing" pain in her chest. She did not take NTG as she could not "reach it". She took 2 ASA 81mg . She was able to go to sleep eventually. She has residual discomfort this morning, pain on left chest/under breast. She did not take crestor last night. She has non-obstructive CAD by recent CT test.   Was discussing ED locations and she said she lived hear [facility] at first, so advised [facility] but then she said she lives near [place] and that she is confused.  Advised that with current symptoms and known CAD, she needs to go to ED for eval. She plans to call EMS to assess/transport her.

## 2023-02-26 NOTE — Telephone Encounter (Signed)
Pt c/o medication issue:  1. Name of Medication: rosuvastatin (CRESTOR) 10 MG tablet   2. How are you currently taking this medication (dosage and times per day)? Take 1 tablet (10 mg total) by mouth daily.   3. Are you having a reaction (difficulty breathing--STAT)? No  4. What is your medication issue? Patient is calling because she has started to pain in her mouth,teeth, throat, neck, and on the side of her stomach. Patient stated that her stomach has been feeling very painful and swollen. Patient stated that she is having a crushing pain on her chest. Patient stated she has been having a headache and feeling fatigue. Patient has only been taking the medication for a few days. Patient stated that her BP was going up and down, patient felt like she couldn't get a good reading of her BP. Please advise.    Patient is concerned about her Echo that is scheduled for May 30th. Patient's insurance authorized the test from 04/26-05/25. Patient is concerned about if her insurance will still cover after 05/25.

## 2023-02-27 NOTE — Telephone Encounter (Signed)
I do not think the symptoms sound like there is anything to do with taking a statin, but the only way he can know for sure is to hold it for couple weeks and see if it goes away.  Then rechallenge to see if it comes back.   I have not seen her in 5 years so I am not sure what to make of the symptoms.  Bryan Lemma, MD

## 2023-02-27 NOTE — Telephone Encounter (Signed)
I do not think that a UTI is being caused by Crestor.  Bryan Lemma, MD

## 2023-02-28 ENCOUNTER — Encounter (HOSPITAL_BASED_OUTPATIENT_CLINIC_OR_DEPARTMENT_OTHER): Payer: Self-pay | Admitting: Emergency Medicine

## 2023-02-28 ENCOUNTER — Emergency Department (HOSPITAL_BASED_OUTPATIENT_CLINIC_OR_DEPARTMENT_OTHER)
Admission: EM | Admit: 2023-02-28 | Discharge: 2023-02-28 | Disposition: A | Discharge status: Home / Self Care | Payer: BC Managed Care – PPO | Attending: Emergency Medicine | Admitting: Emergency Medicine

## 2023-02-28 ENCOUNTER — Other Ambulatory Visit: Payer: Self-pay

## 2023-02-28 ENCOUNTER — Emergency Department (HOSPITAL_BASED_OUTPATIENT_CLINIC_OR_DEPARTMENT_OTHER): Payer: BC Managed Care – PPO

## 2023-02-28 DIAGNOSIS — Z91040 Latex allergy status: Secondary | ICD-10-CM | POA: Diagnosis not present

## 2023-02-28 DIAGNOSIS — J449 Chronic obstructive pulmonary disease, unspecified: Secondary | ICD-10-CM | POA: Insufficient documentation

## 2023-02-28 DIAGNOSIS — J45909 Unspecified asthma, uncomplicated: Secondary | ICD-10-CM | POA: Diagnosis not present

## 2023-02-28 DIAGNOSIS — R079 Chest pain, unspecified: Secondary | ICD-10-CM | POA: Diagnosis not present

## 2023-02-28 DIAGNOSIS — R0789 Other chest pain: Secondary | ICD-10-CM | POA: Diagnosis not present

## 2023-02-28 DIAGNOSIS — Z7982 Long term (current) use of aspirin: Secondary | ICD-10-CM | POA: Diagnosis not present

## 2023-02-28 LAB — TROPONIN I (HIGH SENSITIVITY): Troponin I (High Sensitivity): <2 ng/L (ref <18)

## 2023-02-28 LAB — CBC
HCT: 38.5 % (ref 36.0–46.0)
Hemoglobin: 13 g/dL (ref 12.0–15.0)
MCH: 32 pg (ref 26.0–34.0)
MCHC: 33.8 g/dL (ref 30.0–36.0)
MCV: 94.8 fL (ref 80.0–100.0)
Platelets: 214 10*3/uL (ref 150–400)
RBC: 4.06 MIL/uL (ref 3.87–5.11)
RDW: 14.3 % (ref 11.5–15.5)
WBC: 9.6 10*3/uL (ref 4.0–10.5)
nRBC: 0 % (ref 0.0–0.2)

## 2023-02-28 LAB — BASIC METABOLIC PANEL
Anion gap: 11 (ref 5–15)
BUN: 13 mg/dL (ref 6–20)
CO2: 22 mmol/L (ref 22–32)
Calcium: 8.8 mg/dL — ABNORMAL LOW (ref 8.9–10.3)
Chloride: 100 mmol/L (ref 98–111)
Creatinine, Ser: 0.87 mg/dL (ref 0.44–1.00)
GFR, Estimated: >60 mL/min (ref >60)
Glucose, Bld: 136 mg/dL — ABNORMAL HIGH (ref 70–99)
Potassium: 3.9 mmol/L (ref 3.5–5.1)
Sodium: 133 mmol/L — ABNORMAL LOW (ref 135–145)

## 2023-02-28 NOTE — ED Provider Notes (Signed)
[facility] Provider Note   CSN: [id number] Arrival date & time: 02/28/23  0802     History  Chief Complaint  Patient presents with   Chest Pain   multiple complaints     Sarah Reeves is a 61 y.o. female.  62 year old female presents today for evaluation of chest pain.  Started 2 nights ago.  She took aspirin at that time with some resolution.  She called her cardiology office and was recommended to come into the emergency department.  She states because the emergency department was busy she did not, and decided to wait another day.  She states her pain has been intermittent.  Not associated with diaphoresis, lightheadedness, palpitations, or dyspnea.  Denies any recent long travel, history of PE, or DVT.  She also complains of swelling to the left side of the abdomen.  No urinary complaints.  No flank pain.  Denies any injury.  No bruising.  The history is provided by the patient. No language interpreter was used.       Home Medications Prior to Admission medications   Medication Sig Start Date End Date Taking? Authorizing Provider  Ascorbic Acid (VITAMIN C) 1000 MG tablet Take 1,000 mg by mouth daily.    [provider]  aspirin EC 81 MG tablet Take 1 tablet (81 mg total) by mouth daily. 08/04/18   Marykay Lex, MD  Budeson-Glycopyrrol-Formoterol (BREZTRI AEROSPHERE) 160-9-4.8 MCG/ACT AERO Inhale 2 puffs into the lungs in the morning and at bedtime. Alternates between symbicort and Breztri 02/07/23   Parrett, Virgel Bouquet, NP  cetirizine (ZYRTEC) 10 MG tablet Take 1 tablet (10 mg total) by mouth daily. 09/09/22   Ambs, Norvel Richards, FNP  chlorpheniramine-HYDROcodone (TUSSIONEX) 10-8 MG/5ML SUER     [provider]  cholecalciferol (VITAMIN D) 1000 UNITS tablet Take 2,000 Units by mouth daily.     [provider]  clotrimazole (MYCELEX) 10 MG troche Take 1 tablet (10 mg total) by mouth 5 (five) times daily. 01/09/23    Bevelyn Ngo, NP  cyclobenzaprine (FLEXERIL) 10 MG tablet Take 10 mg by mouth 3 (three) times daily as needed for muscle spasms.    [provider]  Dextromethorphan-guaiFENesin (TUSSIN DM) 10-100 MG/5ML liquid Take 5 mLs by mouth every 12 (twelve) hours. 08/09/21   Jeannie Fend, PA-C  diazepam (VALIUM) 5 MG tablet Take 1 tablet (5 mg total) by mouth 2 (two) times daily. 11/08/22   Glyn Ade, MD  estradiol (VIVELLE-DOT) 0.075 MG/24HR Place onto the skin. 07/30/21   [provider]  fidaxomicin (DIFICID) 200 MG TABS tablet Take 1 tablet (200 mg total) by mouth 2 (two) times daily. 10/30/22   Jenel Lucks, MD  gabapentin (NEURONTIN) 300 MG capsule Take 300 mg by mouth daily as needed (nerve pain). 11/29/19   [provider]  levalbuterol (XOPENEX HFA) 45 MCG/ACT inhaler INHALE 2 PUFFS INTO THE LUNGS EVERY 4 HOURS AS NEEDED FOR WHEEZING 02/07/23   Parrett, Virgel Bouquet, NP  levalbuterol (XOPENEX) 1.25 MG/3ML nebulizer solution Take 1.25 mg by nebulization every 4 (four) hours as needed for wheezing. 10/18/21   Hetty Blend, FNP  levofloxacin (LEVAQUIN) 500 MG tablet Take 1 tablet (500 mg total) by mouth daily. 08/26/22   Bethann Berkshire, MD  LORazepam (ATIVAN) 2 MG tablet Take 2 mg by mouth in the morning, at noon, and at bedtime. 12/17/19   [provider]  meclizine (ANTIVERT) 25 MG tablet Take  25 mg by mouth 3 (three) times daily as needed for dizziness.    [provider]  metoprolol tartrate (LOPRESSOR) 100 MG tablet Take 1 tab 2 hour prior to procedure Patient not taking: Reported on 02/18/2023 01/13/23   Joylene Grapes, NP  montelukast (SINGULAIR) 10 MG tablet Take 1 tablet (10 mg total) by mouth at bedtime. 09/09/22   Ambs, Norvel Richards, FNP  nitroGLYCERIN (NITROSTAT) 0.4 MG SL tablet Place 1 tablet (0.4 mg total) under the tongue every 5 (five) minutes as needed for chest pain. 02/18/23 05/19/23  Joylene Grapes, NP  nystatin (MYCOSTATIN) 100000  UNIT/ML suspension Take 5 mL swish and swallow by mouth 4 times a day for 5 days 05/07/22   Ferol Luz, MD  olmesartan (BENICAR) 20 MG tablet Take 1 tablet (20 mg total) by mouth daily. 01/10/23   Joylene Grapes, NP  pantoprazole (PROTONIX) 40 MG tablet Take 1 tablet (40 mg total) by mouth daily. 04/19/22   Ferol Luz, MD  potassium chloride (KLOR-CON) 10 MEQ tablet Take 1 tablet (10 mEq total) by mouth daily. 11/27/22 02/25/23  Jodelle Gross, NP  progesterone (PROMETRIUM) 100 MG capsule Take 200 mg by mouth at bedtime. 12/15/20   [provider]  rosuvastatin (CRESTOR) 10 MG tablet Take 1 tablet (10 mg total) by mouth daily. 01/29/23   Joylene Grapes, NP  valACYclovir (VALTREX) 500 MG tablet Take 500 mg by mouth daily as needed (outbreaks). Patient not taking: Reported on 02/18/2023 11/16/19   [provider]  Vitamin D, Cholecalciferol, 25 MCG (1000 UT) TABS Take 25 mcg by mouth daily. 11/27/22   Jodelle Gross, NP  zolpidem (AMBIEN) 10 MG tablet Take 10 mg by mouth at bedtime. 03/20/21   [provider]      Allergies    Keflex [cephalexin], Oysters [shellfish allergy], Vancomycin, Zoloft [sertraline hcl], Latex, Amitriptyline, Ciprofloxacin, Clindamycin, Clindamycin/lincomycin, Other, Penicillins, Pentosan polysulfate sodium, Shellfish-derived products, Sulfa antibiotics, Topamax [topiramate], Uribel [meth-hyo-m bl-na phos-ph sal], Uribel [urelle], Lamotrigine, and Sulfamethoxazole-trimethoprim    Review of Systems   Review of Systems  Constitutional:  Negative for fever.  Respiratory:  Negative for shortness of breath.   Cardiovascular:  Positive for chest pain. Negative for palpitations and leg swelling.  Gastrointestinal:  Negative for abdominal pain, nausea and vomiting.  Genitourinary:  Negative for dysuria and flank pain.  All other systems reviewed and are negative.   Physical Exam Updated Vital Signs BP 106/69   Pulse 93   Temp 97.8 F  (36.6 C)   Resp 18   Wt 34 kg   SpO2 96%   BMI 12.87 kg/m  Physical Exam Vitals and nursing note reviewed.  Constitutional:      General: She is not in acute distress.    Appearance: Normal appearance. She is not ill-appearing.  HENT:     Head: Normocephalic and atraumatic.     Nose: Nose normal.  Eyes:     General: No scleral icterus.    Extraocular Movements: Extraocular movements intact.     Conjunctiva/sclera: Conjunctivae normal.  Cardiovascular:     Rate and Rhythm: Normal rate and regular rhythm.     Pulses: Normal pulses.  Pulmonary:     Effort: Pulmonary effort is normal. No respiratory distress.     Breath sounds: Normal breath sounds. No wheezing or rales.  Abdominal:     General: There is no distension.     Palpations: Abdomen is soft.     Tenderness: There  is no abdominal tenderness. There is no guarding.     Comments: No bruising, or obvious swelling noted.  The area of concern shows floating rib.  No other concerning findings.  No significant tenderness to palpation over this area.  Musculoskeletal:        General: Normal range of motion.     Cervical back: Normal range of motion.  Skin:    General: Skin is warm and dry.  Neurological:     General: No focal deficit present.     Mental Status: She is alert. Mental status is at baseline.     ED Results / Procedures / Treatments   Labs (all labs ordered are listed, but only abnormal results are displayed) Labs Reviewed  BASIC METABOLIC PANEL - Abnormal; Notable for the following components:      Result Value   Sodium 133 (*)    Glucose, Bld 136 (*)    Calcium 8.8 (*)    All other components within normal limits  CBC  TROPONIN I (HIGH SENSITIVITY)    EKG EKG Interpretation  Date/Time:  Friday Feb 28 2023 08:12:00 EDT Ventricular Rate:  99 PR Interval:  107 QRS Duration: 88 QT Interval:  350 QTC Calculation: 450 R Axis:   59 Text Interpretation: Sinus rhythm normal, no change from previous  Confirmed by Arby Barrette [id number]) on 02/28/2023 8:58:42 AM  Radiology DG Chest 2 View  Result Date: 02/28/2023 CLINICAL DATA:  Chest pain. EXAM: CHEST - 2 VIEW COMPARISON:  August 26, 2022. FINDINGS: The heart size and mediastinal contours are within normal limits. Both lungs are clear. The visualized skeletal structures are unremarkable. IMPRESSION: No active cardiopulmonary disease. Electronically Signed   By: Lupita Raider M.D.   On: 02/28/2023 08:28    Procedures Procedures    Medications Ordered in ED Medications - No data to display  ED Course/ Medical Decision Making/ A&P                             Medical Decision Making Amount and/or Complexity of Data Reviewed Labs: ordered. Radiology: ordered.   Medical Decision Making / ED Course   This patient presents to the ED for concern of chest pain, this involves an extensive number of treatment options, and is a complaint that carries with it a high risk of complications and morbidity.  The differential diagnosis includes ACS, dissection, PE, MSK pain, GERD, anxiety  MDM: 62 year old female presents today for evaluation of chest pain.  Started 2 nights ago.  She does follow with cardiology.  Has nonobstructive disease.  Does have history of chronic chest pain.  She has taken aspirin but did not take nitro.  Her pain waxes and wanes.  She states anxiety makes her pain worse.  Improves with aspirin.  Low risk for PE on Wells criteria.  Also complains of swelling over the left side of the abdomen.  This is over the area of the floating ribs.  She does report violent coughing spells.  No significant tenderness or bruising over this area.  Likely MSK in etiology.  She is without urinary complaints.  ACS workup reassuring.  Low heart score.  Troponin undetectable.  Given symptoms ongoing for 2 days.  Do not need to repeat troponin.  Low suspicion for ACS.  EKG without acute ischemic changes.  Chest x-ray without cardiopulmonary  process.  History not consistent with dissection.  Hemodynamically stable.  Case discussed with attending.  Patient is appropriate for discharge.  Discharged in stable condition.  She will follow-up with PCP and cardiologist.   Lab Tests: -I ordered, reviewed, and interpreted labs.   The pertinent results include:   Labs Reviewed  BASIC METABOLIC PANEL - Abnormal; Notable for the following components:      Result Value   Sodium 133 (*)    Glucose, Bld 136 (*)    Calcium 8.8 (*)    All other components within normal limits  CBC  TROPONIN I (HIGH SENSITIVITY)  TROPONIN I (HIGH SENSITIVITY)      EKG  EKG Interpretation  Date/Time:  Friday Feb 28 2023 08:12:00 EDT Ventricular Rate:  99 PR Interval:  107 QRS Duration: 88 QT Interval:  350 QTC Calculation: 450 R Axis:   59 Text Interpretation: Sinus rhythm normal, no change from previous Confirmed by Arby Barrette [phone]) on 02/28/2023 8:58:42 AM         Imaging Studies ordered: I ordered imaging studies including chest x-ray I independently visualized and interpreted imaging. I agree with the radiologist interpretation   Medicines ordered and prescription drug management: No orders of the defined types were placed in this encounter.   -I have reviewed the patients home medicines and have made adjustments as needed   Cardiac Monitoring: The patient was maintained on a cardiac monitor.  I personally viewed and interpreted the cardiac monitored which showed an underlying rhythm of: Normal sinus rhythm  Reevaluation: After the interventions noted above, I reevaluated the patient and found that they have :stayed the same  Co morbidities that complicate the patient evaluation  Past Medical History:  Diagnosis Date   Angio-edema    Anxiety    Arthritis    Asthma    Clostridioides difficile infection    Complication of anesthesia    takes alot to sedated   COPD (chronic obstructive pulmonary disease) (HCC)     Depression    Fibromyalgia    GERD (gastroesophageal reflux disease)    Hepatitis C    (Dr. Kinnie Scales) Treated with Harvoni March-May 2016   Herpes simplex    Hyperlipemia    Insomnia    Mental disorder    Neuropathy    PONV (postoperative nausea and vomiting)    Sleep apnea    Urticaria       Dispostion: Patient is appropriate for discharge.  Discharged in stable condition.  Return precaution discussed.  Patient voices understanding and is in agreement with plan.   Final Clinical Impression(s) / ED Diagnoses Final diagnoses:  Atypical chest pain    Rx / DC Orders ED Discharge Orders     None         Marita Kansas, PA-C 02/28/23 1012    Arby Barrette, MD 03/03/23 1858

## 2023-02-28 NOTE — ED Notes (Signed)
Patient transported to X-ray 

## 2023-02-28 NOTE — ED Triage Notes (Signed)
Neck pain and throat , crushing chest pain , took aspirin prior to call 911. Has Nitro but did not use it . Bilateral flank pain . Denies urinary symptoms .

## 2023-02-28 NOTE — Telephone Encounter (Signed)
Called left message that Dr Herbie [place] and [facility] pharmacist - does not think UTI is coming from the use of Carotid  F/u with primary provider.  Any question may call back

## 2023-02-28 NOTE — Discharge Instructions (Addendum)
Your workup today was reassuring.  No concerning cause of your chest pain.  Particularly no evidence of heart attack.  Recommend you follow-up with your primary care provider and cardiologist.  For any concerning symptoms return to the emergency department.

## 2023-03-03 ENCOUNTER — Telehealth: Payer: Self-pay | Admitting: Cardiology

## 2023-03-03 NOTE — Telephone Encounter (Signed)
Pt would like a callback regarding ED visit on 5/10. Please advise

## 2023-03-03 NOTE — Telephone Encounter (Signed)
Called patient, advised of her recent ED visit.   Patient aware to have ECHO completed on 05/21, and follow up visit with NP on 06/04.   Patient aware and verbalized understanding.

## 2023-03-11 ENCOUNTER — Ambulatory Visit (HOSPITAL_COMMUNITY): Payer: BC Managed Care – PPO | Attending: Cardiology

## 2023-03-11 DIAGNOSIS — R0602 Shortness of breath: Secondary | ICD-10-CM | POA: Insufficient documentation

## 2023-03-11 HISTORY — PX: TRANSTHORACIC ECHOCARDIOGRAM: SHX275

## 2023-03-11 LAB — ECHOCARDIOGRAM COMPLETE
Area-P 1/2: 5.31 cm2
S' Lateral: 2.7 cm

## 2023-03-12 NOTE — Telephone Encounter (Addendum)
Spoke with patient. She has been holding crestor for weeks now. Advised to continue to hold, as she has a visit on 03/25/23 with Irving Burton NP. Advised to discuss cholesterol management further with her then. She would prefer natural alternatives for cholesterol treatment.

## 2023-03-13 NOTE — Telephone Encounter (Signed)
Pt cancelled the echo

## 2023-03-20 ENCOUNTER — Ambulatory Visit (HOSPITAL_COMMUNITY): Payer: BC Managed Care – PPO

## 2023-03-23 DIAGNOSIS — G4736 Sleep related hypoventilation in conditions classified elsewhere: Secondary | ICD-10-CM | POA: Diagnosis not present

## 2023-03-23 DIAGNOSIS — J449 Chronic obstructive pulmonary disease, unspecified: Secondary | ICD-10-CM | POA: Diagnosis not present

## 2023-03-24 NOTE — Progress Notes (Deleted)
Office Visit    Patient Name: Sarah Reeves Date of Encounter: 03/24/2023  Primary Care Provider:  Marcelle Overlie, MD Primary Cardiologist:  Bryan Lemma, MD  Chief Complaint    62 year old female with a history of nonobstructive CAD, chronic chest pain, hypertension, COPD, tobacco use, hepatitis, and chronic anxiety who presents for follow-up related to CAD and chest pain.   Past Medical History    Past Medical History:  Diagnosis Date   Angio-edema    Anxiety    Arthritis    Asthma    Clostridioides difficile infection    Complication of anesthesia    takes alot to sedated   COPD (chronic obstructive pulmonary disease) (HCC)    Depression    Fibromyalgia    GERD (gastroesophageal reflux disease)    Hepatitis C    (Dr. Kinnie Scales) Treated with Harvoni March-May 2016   Herpes simplex    Hyperlipemia    Insomnia    Mental disorder    Neuropathy    PONV (postoperative nausea and vomiting)    Sleep apnea    Urticaria    Past Surgical History:  Procedure Laterality Date   AUGMENTATION MAMMAPLASTY Bilateral    BREAST BIOPSY Left 11/2014   BREAST BIOPSY Left 12/2014   BREAST ENHANCEMENT SURGERY     BREAST LUMPECTOMY WITH RADIOACTIVE SEED LOCALIZATION Left 12/26/2014   Procedure: LEFT BREAST LUMPECTOMY WITH RADIOACTIVE SEED LOCALIZATION;  Surgeon: Chevis Pretty III, MD;  Location: [facility];  Service: General;  Laterality: Left;   BREAST REDUCTION SURGERY     COLONOSCOPY     EXPLORATORY LAPAROTOMY     NM MYOVIEW LTD  07/07/2015   Normal Myocardial Perfusion Scan. Low risk lexiscan nuclear study with minimal insignificant breast attenuation and normal myocardial perfusion and function; EF 53% without wall motion abnormalities and normal systolic thickening   TEMPOROMANDIBULAR JOINT SURGERY     TUBAL LIGATION     UPPER GI ENDOSCOPY     x5    Allergies  Allergies  Allergen Reactions   Keflex [Cephalexin] Hives, Itching and Other (See Comments)     Tongue and throat itching/tingling   Oysters [Shellfish Allergy] Other (See Comments)    Unknown reaction; "MD told me I was allergic"   Vancomycin Anaphylaxis    Face and tongue swells   Zoloft [Sertraline Hcl] Other (See Comments)    REACTION: Nightmares, Grind teeth really bad   Latex Itching    Itch and blisters from contact   Amitriptyline     Hot, itching, tongue tingling, breakthrough bleeding.   Ciprofloxacin Other (See Comments)   Clindamycin Other (See Comments)   Clindamycin/Lincomycin Hives and Other (See Comments)    Stated that this causes her to get really horse and made her feel really hot   Other Other (See Comments)   Penicillins    Pentosan Polysulfate Sodium Swelling    Throat swelling   Shellfish-Derived Products Other (See Comments)   Sulfa Antibiotics Other (See Comments)   Topamax [Topiramate]     Says it makes her "constipation and she can't urinate normally"   Uribel [Meth-Hyo-M Bl-Na Phos-Ph Sal] Nausea Only   Uribel [Urelle] Swelling and Other (See Comments)    Patient stated this makes her stomach swell and makes her feel si   Lamotrigine Rash   Sulfamethoxazole-Trimethoprim Rash    Patient developed feelings of dizziness, rash and itchy hands.  Patient then vomited and had diarrhea.     Labs/Other Studies Reviewed  The following studies were reviewed today:  Cardiac Studies & Procedures     STRESS TESTS  MYOCARDIAL PERFUSION IMAGING 07/07/2015  Narrative  Nuclear stress EF: 53%.  The left ventricular ejection fraction is mildly decreased (45-54%).  There was no ST segment deviation noted during stress.  Normal myocardial perfusion and function.  Low risk lexiscan nuclear study with minimal insignificant breast attenuation and normal myocardial perfusion and function; EF 53% without wall motion abnormalities and normal systolic thickening.   ECHOCARDIOGRAM  ECHOCARDIOGRAM COMPLETE 03/11/2023  Narrative ECHOCARDIOGRAM  REPORT    Patient Name:   Sarah Reeves Date of Exam: 03/11/2023 Medical Rec #:  [id number]       Height:       64.0 in Accession #:    [id number]      Weight:       75.0 lb Date of Birth:  25-Aug-1961       BSA:          1.289 m Patient Age:    61 years        BP:           138/86 mmHg Patient Gender: F               HR:           85 bpm. Exam Location:  [address]  Procedure: 2D Echo, Cardiac Doppler, Color Doppler and Strain Analysis  Indications:    R06.00 Dyspnea  History:        Patient has prior history of Echocardiogram examinations. Signs/Symptoms:Dizziness/Lightheadedness; Risk Factors:Current Smoker.  Sonographer:    Clearence Ped RCS Referring Phys: [phone] TAMMY S PARRETT  IMPRESSIONS   1. Left ventricular ejection fraction, by estimation, is 60 to 65%. The left ventricle has normal function. The left ventricle has no regional wall motion abnormalities. Left ventricular diastolic parameters are consistent with Grade I diastolic dysfunction (impaired relaxation). The average left ventricular global longitudinal strain is -16.5 %. The global longitudinal strain is normal. 2. Right ventricular systolic function is normal. The right ventricular size is normal. 3. The mitral valve is normal in structure. No evidence of mitral valve regurgitation. No evidence of mitral stenosis. Moderate mitral annular calcification. 4. The aortic valve is tricuspid. Aortic valve regurgitation is not visualized. No aortic stenosis is present. 5. The inferior vena cava is normal in size with greater than 50% respiratory variability, suggesting right atrial pressure of 3 mmHg.  FINDINGS Left Ventricle: Left ventricular ejection fraction, by estimation, is 60 to 65%. The left ventricle has normal function. The left ventricle has no regional wall motion abnormalities. The average left ventricular global longitudinal strain is -16.5 %. The global longitudinal strain is normal. The left ventricular  internal cavity size was normal in size. There is no left ventricular hypertrophy. Left ventricular diastolic parameters are consistent with Grade I diastolic dysfunction (impaired relaxation).  Right Ventricle: The right ventricular size is normal. No increase in right ventricular wall thickness. Right ventricular systolic function is normal.  Left Atrium: Left atrial size was normal in size.  Right Atrium: Right atrial size was normal in size.  Pericardium: There is no evidence of pericardial effusion.  Mitral Valve: The mitral valve is normal in structure. Moderate mitral annular calcification. No evidence of mitral valve regurgitation. No evidence of mitral valve stenosis.  Tricuspid Valve: The tricuspid valve is normal in structure. Tricuspid valve regurgitation is not demonstrated. No evidence of tricuspid stenosis.  Aortic Valve: The aortic valve is tricuspid.  Aortic valve regurgitation is not visualized. No aortic stenosis is present.  Pulmonic Valve: The pulmonic valve was normal in structure. Pulmonic valve regurgitation is not visualized. No evidence of pulmonic stenosis.  Aorta: The aortic root is normal in size and structure.  Venous: The inferior vena cava is normal in size with greater than 50% respiratory variability, suggesting right atrial pressure of 3 mmHg.  IAS/Shunts: No atrial level shunt detected by color flow Doppler.   LEFT VENTRICLE PLAX 2D LVIDd:         4.10 cm   Diastology LVIDs:         2.70 cm   LV e' medial:    8.92 cm/s LV PW:         0.90 cm   LV E/e' medial:  9.0 LV IVS:        1.00 cm   LV e' lateral:   10.20 cm/s LVOT diam:     1.90 cm   LV E/e' lateral: 7.9 LV SV:         69 LV SV Index:   53        2D Longitudinal Strain LVOT Area:     2.84 cm  2D Strain GLS (A2C):   -16.4 % 2D Strain GLS (A3C):   -17.1 % 2D Strain GLS (A4C):   -16.0 % 2D Strain GLS Avg:     -16.5 %  RIGHT VENTRICLE RV Basal diam:  2.80 cm RV S prime:     9.36  cm/s TAPSE (M-mode): 1.9 cm  LEFT ATRIUM             Index        RIGHT ATRIUM           Index LA diam:        3.40 cm 2.64 cm/m   RA Area:     11.10 cm LA Vol (A2C):   29.0 ml 22.49 ml/m  RA Volume:   24.00 ml  18.61 ml/m LA Vol (A4C):   27.1 ml 21.02 ml/m LA Biplane Vol: 28.9 ml 22.41 ml/m AORTIC VALVE LVOT Vmax:   95.00 cm/s LVOT Vmean:  63.900 cm/s LVOT VTI:    0.243 m  AORTA Ao Root diam: 3.40 cm Ao Asc diam:  2.90 cm  MITRAL VALVE MV Area (PHT):             SHUNTS MV Decel Time:             Systemic VTI:  0.24 m MV E velocity: 80.70 cm/s  Systemic Diam: 1.90 cm MV A velocity: 95.40 cm/s MV E/A ratio:  0.85  Donato Schultz MD Electronically signed by Donato Schultz MD Signature Date/Time: 03/11/2023/11:48:53 AM    Final     CT SCANS  CT CORONARY MORPH W/CTA COR W/SCORE 01/19/2023  Addendum 01/19/2023 11:12 PM ADDENDUM REPORT: 01/19/2023 23:09  EXAM: OVER-READ INTERPRETATION  CT CHEST  The following report is an over-read performed by radiologist Dr. Arnoldo Morale [facility] on 01/19/2023. This over-read does not include interpretation of cardiac or coronary anatomy or pathology. The coronary CTA interpretation by the cardiologist is attached.  COMPARISON:  Chest CT 01/02/2023.  FINDINGS: Mild emphysema.  Bilateral breast implants.  IMPRESSION: No significant extracardiac findings.   Electronically Signed By: Elgie Collard M.D. On: 01/19/2023 23:09  Narrative CLINICAL DATA:  66F with chest pain  EXAM: Cardiac/Coronary CTA  TECHNIQUE: The patient was scanned on a [employer].  FINDINGS: A 100 kV prospective  scan was triggered in the descending thoracic aorta at 111 HU's. Axial non-contrast 3 mm slices were carried out through the heart. The data set was analyzed on a dedicated work station and scored using the Agatson method. Gantry rotation speed was 250 msecs and collimation was .6 mm. No beta blockade and  0.8 mg of sl NTG was given. The 3D data set was reconstructed in 5% intervals of the 35-75% of the R-R cycle. Phases were analyzed on a dedicated work station using MPR, MIP and VRT modes. The patient received 100 cc of contrast.  Coronary Arteries:  Normal coronary origin.  Right dominance.  RCA is a large dominant artery that gives rise to PDA and PLA. Noncalcified plaque in proximal RCA causes 0-24% stenosis  Left main is a large artery that gives rise to LAD and LCX arteries.  LAD is a large vessel. Noncalcified plaque in proximal LAD causes 0-24% stenosis. Noncalcified plaque in ostium of large D1 branch causes 50-69% stenosis  LCX is a non-dominant artery that gives rise to one large OM1 branch. There is no plaque.  Other findings:  Left Ventricle: Normal size  Left Atrium: Normal size  Pulmonary Veins: Normal configuration  Right Ventricle: Normal size  Right Atrium: Normal size  Cardiac valves: No calcifications  Thoracic aorta: Normal size  Pulmonary Arteries: Normal size  Systemic Veins: Normal drainage  Pericardium: Normal thickness  IMPRESSION: 1. Coronary calcium score of 0.  2. Total plaque volume 54 mm3 which is 52nd percentile for age- and sex-matched controls (calcified plaque 0 mm3; noncalcified plaque 54 mm3). Total plaque volume is mild  3. Normal coronary origin with right dominance.  4. Noncalcified plaque in ostium of large D1 branch causes moderate (50-69%) stenosis  5. Noncalcified plaque in proximal LAD causes minimal (0-24%) stenosis.  6. Noncalcified plaque in proximal RCA causes minimal (0-24%) stenosis  7. Will send study for CTFFR  CAD-RADS 3. Moderate stenosis. Consider symptom-guided anti-ischemic pharmacotherapy as well as risk factor modification per guideline directed care. Additional analysis with CT FFR will be submitted.  Electronically Signed: By: Epifanio Lesches M.D. On: 01/17/2023 16:19          Recent Labs: 11/08/2022: TSH 2.047 01/24/2023: ALT 27 02/28/2023: BUN 13; Creatinine, Ser 0.87; Hemoglobin 13.0; Platelets 214; Potassium 3.9; Sodium 133  Recent Lipid Panel    Component Value Date/Time   CHOL 272 (H) 01/24/2023 0843   TRIG 514 (H) 01/24/2023 0843   HDL 60 01/24/2023 0843   CHOLHDL 4.5 (H) 01/24/2023 0843   CHOLHDL 3.3 06/27/2018 0623   VLDL 23 06/27/2018 0623   LDLCALC 121 (H) 01/24/2023 0843    History of Present Illness    62 year old female with the above past medical history including nonobstructive CAD, chronic chest pain, hypertension, COPD, tobacco use, hepatitis, and chronic anxiety.  She has a history of chronic chest pain. Lexiscan in 2016 was negative for ischemia. CT of the chest for lung cancer screening in 12/2022 revealed evidence of aortic atherosclerosis, coronary calcifications.  At her follow-up visit in March 2024 she noted intermittent chest discomfort, stable chronic dyspnea in the setting of COPD (follows with pulmonology).  Hydrochlorothiazide was discontinued in setting of chronic hypokalemia.  She was started on olmesartan 20 mg daily.  Coronary CT angiogram revealed nonobstructive CAD.  Echocardiogram in 02/2023 revealed EF 60 to 65%, normal LV function, no RWMA, G1 DD, normal RV systolic function, no significant valvular abnormalities.  She was last seen in the office on  02/18/2023 and noted stable dyspnea in the setting of COPD.  She reported increased alcohol consumption in the setting of personal stress.  She continued to smoke.  She had not started taking Crestor due to concerns for possible elevated blood sugar as a result of statin therapy.  She was advised to begin Crestor 10 mg daily.  She presents today for follow-up accompanied by her friend. Since her last visit she has    1. CAD/chest pain: She has a longstanding history of intermittent chest discomfort.  Myoview in 2016 was low risk.  CT of the chest for lung cancer screening that showed  evidence of aortic atherosclerosis, possible coronary calcifications.  CCTA in 12/2022 revealed nonobstructive CAD.  Initiate Crestor as below.   2. Hypertension: BP well controlled. Continue current antihypertensive regimen.    3. Hyperlipidemia: LDL was 121 in 01/2023.  Previously advised to start Crestor, however, she notes that she was afraid to start taking the medication as she thought it would elevate her blood sugar and increase her cortisol levels.  We again discussed indications for statin therapy.  She is agreeable to trial Crestor 10 mg daily.  Plan for fasting lipids, LFTs at next follow-up.   4. COPD/tobacco use: She continues to smoke.  Now on home O2 at night.  Following with pulmonology.  Full cessation advised.   5. Anxiety: Ongoing, stable.  Managed per PCP.   6. Disposition: Follow-up I  Home Medications    Current Outpatient Medications  Medication Sig Dispense Refill   Ascorbic Acid (VITAMIN C) 1000 MG tablet Take 1,000 mg by mouth daily.     aspirin EC 81 MG tablet Take 1 tablet (81 mg total) by mouth daily. 90 tablet 3   Budeson-Glycopyrrol-Formoterol (BREZTRI AEROSPHERE) 160-9-4.8 MCG/ACT AERO Inhale 2 puffs into the lungs in the morning and at bedtime. Alternates between symbicort and Breztri 10.7 g 6   cetirizine (ZYRTEC) 10 MG tablet Take 1 tablet (10 mg total) by mouth daily. 30 tablet 5   chlorpheniramine-HYDROcodone (TUSSIONEX) 10-8 MG/5ML SUER      cholecalciferol (VITAMIN D) 1000 UNITS tablet Take 2,000 Units by mouth daily.      clotrimazole (MYCELEX) 10 MG troche Take 1 tablet (10 mg total) by mouth 5 (five) times daily. 70 tablet 0   cyclobenzaprine (FLEXERIL) 10 MG tablet Take 10 mg by mouth 3 (three) times daily as needed for muscle spasms.     Dextromethorphan-guaiFENesin (TUSSIN DM) 10-100 MG/5ML liquid Take 5 mLs by mouth every 12 (twelve) hours. 200 mL 0   diazepam (VALIUM) 5 MG tablet Take 1 tablet (5 mg total) by mouth 2 (two) times daily. 10 tablet  0   estradiol (VIVELLE-DOT) 0.075 MG/24HR Place onto the skin.     fidaxomicin (DIFICID) 200 MG TABS tablet Take 1 tablet (200 mg total) by mouth 2 (two) times daily. 20 tablet 0   gabapentin (NEURONTIN) 300 MG capsule Take 300 mg by mouth daily as needed (nerve pain).     levalbuterol (XOPENEX HFA) 45 MCG/ACT inhaler INHALE 2 PUFFS INTO THE LUNGS EVERY 4 HOURS AS NEEDED FOR WHEEZING 15 g 1   levalbuterol (XOPENEX) 1.25 MG/3ML nebulizer solution Take 1.25 mg by nebulization every 4 (four) hours as needed for wheezing. 72 mL 5   levofloxacin (LEVAQUIN) 500 MG tablet Take 1 tablet (500 mg total) by mouth daily. 7 tablet 0   LORazepam (ATIVAN) 2 MG tablet Take 2 mg by mouth in the morning, at noon, and at bedtime.  meclizine (ANTIVERT) 25 MG tablet Take 25 mg by mouth 3 (three) times daily as needed for dizziness.     metoprolol tartrate (LOPRESSOR) 100 MG tablet Take 1 tab 2 hour prior to procedure (Patient not taking: Reported on 02/18/2023) 1 tablet 0   montelukast (SINGULAIR) 10 MG tablet Take 1 tablet (10 mg total) by mouth at bedtime. 30 tablet 5   nitroGLYCERIN (NITROSTAT) 0.4 MG SL tablet Place 1 tablet (0.4 mg total) under the tongue every 5 (five) minutes as needed for chest pain. 25 tablet 2   nystatin (MYCOSTATIN) 100000 UNIT/ML suspension Take 5 mL swish and swallow by mouth 4 times a day for 5 days 60 mL 0   olmesartan (BENICAR) 20 MG tablet Take 1 tablet (20 mg total) by mouth daily. 90 tablet 3   pantoprazole (PROTONIX) 40 MG tablet Take 1 tablet (40 mg total) by mouth daily. 90 tablet 1   potassium chloride (KLOR-CON) 10 MEQ tablet Take 1 tablet (10 mEq total) by mouth daily. 30 tablet 3   progesterone (PROMETRIUM) 100 MG capsule Take 200 mg by mouth at bedtime.     rosuvastatin (CRESTOR) 10 MG tablet Take 1 tablet (10 mg total) by mouth daily. 90 tablet 3   valACYclovir (VALTREX) 500 MG tablet Take 500 mg by mouth daily as needed (outbreaks). (Patient not taking: Reported on  02/18/2023)     Vitamin D, Cholecalciferol, 25 MCG (1000 UT) TABS Take 25 mcg by mouth daily. 60 tablet 3   zolpidem (AMBIEN) 10 MG tablet Take 10 mg by mouth at bedtime.     No current facility-administered medications for this visit.   Facility-Administered Medications Ordered in Other Visits  Medication Dose Route Frequency Provider Last Rate Last Admin   chlorhexidine (HIBICLENS) 4 % liquid 1 application  1 application  Topical Once Chevis Pretty III, MD       chlorhexidine (HIBICLENS) 4 % liquid 1 application  1 application  Topical Once Griselda Miner, MD         Review of Systems    ***.  All other systems reviewed and are otherwise negative except as noted above.    Physical Exam    VS:  There were no vitals taken for this visit. , BMI There is no height or weight on file to calculate BMI.     GEN: Well nourished, well developed, in no acute distress. HEENT: normal. Neck: Supple, no JVD, carotid bruits, or masses. Cardiac: RRR, no murmurs, rubs, or gallops. No clubbing, cyanosis, edema.  Radials/DP/PT 2+ and equal bilaterally.  Respiratory:  Respirations regular and unlabored, clear to auscultation bilaterally. GI: Soft, nontender, nondistended, BS + x 4. MS: no deformity or atrophy. Skin: warm and dry, no rash. Neuro:  Strength and sensation are intact. Psych: Normal affect.  Accessory Clinical Findings    ECG personally reviewed by me today - *** - no acute changes.   Lab Results  Component Value Date   WBC 9.6 02/28/2023   HGB 13.0 02/28/2023   HCT 38.5 02/28/2023   MCV 94.8 02/28/2023   PLT 214 02/28/2023   Lab Results  Component Value Date   CREATININE 0.87 02/28/2023   BUN 13 02/28/2023   NA 133 (L) 02/28/2023   K 3.9 02/28/2023   CL 100 02/28/2023   CO2 22 02/28/2023   Lab Results  Component Value Date   ALT 27 01/24/2023   AST 34 01/24/2023   ALKPHOS 71 01/24/2023   BILITOT 0.4 01/24/2023  Lab Results  Component Value Date   CHOL 272 (H)  01/24/2023   HDL 60 01/24/2023   LDLCALC 121 (H) 01/24/2023   TRIG 514 (H) 01/24/2023   CHOLHDL 4.5 (H) 01/24/2023    Lab Results  Component Value Date   HGBA1C 6.0 (H) 03/10/2021    Assessment & Plan    1.  ***  No BP recorded.  {Refresh Note OR Click here to enter BP  :1}***   Joylene Grapes, NP 03/24/2023, 1:45 PM

## 2023-03-25 ENCOUNTER — Ambulatory Visit: Payer: BC Managed Care – PPO | Admitting: Nurse Practitioner

## 2023-03-25 DIAGNOSIS — F411 Generalized anxiety disorder: Secondary | ICD-10-CM

## 2023-03-25 DIAGNOSIS — I10 Essential (primary) hypertension: Secondary | ICD-10-CM

## 2023-03-25 DIAGNOSIS — J4489 Other specified chronic obstructive pulmonary disease: Secondary | ICD-10-CM

## 2023-03-25 DIAGNOSIS — I2510 Atherosclerotic heart disease of native coronary artery without angina pectoris: Secondary | ICD-10-CM

## 2023-03-25 DIAGNOSIS — E785 Hyperlipidemia, unspecified: Secondary | ICD-10-CM

## 2023-04-03 DIAGNOSIS — L659 Nonscarring hair loss, unspecified: Secondary | ICD-10-CM | POA: Diagnosis not present

## 2023-04-03 DIAGNOSIS — L821 Other seborrheic keratosis: Secondary | ICD-10-CM | POA: Diagnosis not present

## 2023-04-03 DIAGNOSIS — N950 Postmenopausal bleeding: Secondary | ICD-10-CM | POA: Diagnosis not present

## 2023-04-04 ENCOUNTER — Ambulatory Visit: Payer: BC Managed Care – PPO | Attending: Nurse Practitioner | Admitting: Nurse Practitioner

## 2023-04-04 ENCOUNTER — Encounter: Payer: Self-pay | Admitting: Nurse Practitioner

## 2023-04-04 ENCOUNTER — Other Ambulatory Visit: Payer: Self-pay | Admitting: *Deleted

## 2023-04-04 VITALS — BP 98/60 | HR 110 | Ht 64.5 in | Wt 163.0 lb

## 2023-04-04 DIAGNOSIS — I25118 Atherosclerotic heart disease of native coronary artery with other forms of angina pectoris: Secondary | ICD-10-CM

## 2023-04-04 DIAGNOSIS — I10 Essential (primary) hypertension: Secondary | ICD-10-CM | POA: Diagnosis not present

## 2023-04-04 DIAGNOSIS — J4489 Other specified chronic obstructive pulmonary disease: Secondary | ICD-10-CM

## 2023-04-04 DIAGNOSIS — R079 Chest pain, unspecified: Secondary | ICD-10-CM

## 2023-04-04 DIAGNOSIS — E785 Hyperlipidemia, unspecified: Secondary | ICD-10-CM

## 2023-04-04 DIAGNOSIS — F411 Generalized anxiety disorder: Secondary | ICD-10-CM

## 2023-04-04 DIAGNOSIS — Z720 Tobacco use: Secondary | ICD-10-CM

## 2023-04-04 MED ORDER — OLMESARTAN MEDOXOMIL 20 MG PO TABS
20.0000 mg | ORAL_TABLET | Freq: Every day | ORAL | 3 refills | Status: DC
Start: 2023-04-04 — End: 2024-02-26

## 2023-04-04 MED ORDER — POTASSIUM CHLORIDE ER 10 MEQ PO TBCR: 10.0000 meq | EXTENDED_RELEASE_TABLET | Freq: Every day | ORAL | 3 refills | Status: DC

## 2023-04-04 NOTE — Progress Notes (Signed)
Office Visit    Patient Name: Sarah Reeves Date of Encounter: 04/04/2023  Primary Care Provider:  Marcelle Overlie, MD Primary Cardiologist:  Sarah Lemma, MD  Chief Complaint    62 year old female with a history of nonobstructive CAD, chronic chest pain, hypertension, COPD, tobacco use, hepatitis, and chronic anxiety who presents for follow-up related to CAD and hyperlipidemia.  Past Medical History    Past Medical History:  Diagnosis Date   Angio-edema    Anxiety    Arthritis    Asthma    Clostridioides difficile infection    Complication of anesthesia    takes alot to sedated   COPD (chronic obstructive pulmonary disease) (HCC)    Depression    Fibromyalgia    GERD (gastroesophageal reflux disease)    Hepatitis C    (Dr. Kinnie Reeves) Treated with Harvoni March-May 2016   Herpes simplex    Hyperlipemia    Insomnia    Mental disorder    Neuropathy    PONV (postoperative nausea and vomiting)    Sleep apnea    Urticaria    Past Surgical History:  Procedure Laterality Date   AUGMENTATION MAMMAPLASTY Bilateral    BREAST BIOPSY Left 11/2014   BREAST BIOPSY Left 12/2014   BREAST ENHANCEMENT SURGERY     BREAST LUMPECTOMY WITH RADIOACTIVE SEED LOCALIZATION Left 12/26/2014   Procedure: LEFT BREAST LUMPECTOMY WITH RADIOACTIVE SEED LOCALIZATION;  Surgeon: Sarah Pretty III, MD;  Location: [facility];  Service: General;  Laterality: Left;   BREAST REDUCTION SURGERY     COLONOSCOPY     EXPLORATORY LAPAROTOMY     NM MYOVIEW LTD  07/07/2015   Normal Myocardial Perfusion Scan. Low risk lexiscan nuclear study with minimal insignificant breast attenuation and normal myocardial perfusion and function; EF 53% without wall motion abnormalities and normal systolic thickening   TEMPOROMANDIBULAR JOINT SURGERY     TUBAL LIGATION     UPPER GI ENDOSCOPY     x5    Allergies  Allergies  Allergen Reactions   Keflex [Cephalexin] Hives, Itching and Other (See Comments)     Tongue and throat itching/tingling   Oysters [Shellfish Allergy] Other (See Comments)    Unknown reaction; "MD told me I was allergic"   Vancomycin Anaphylaxis    Face and tongue swells   Zoloft [Sertraline Hcl] Other (See Comments)    REACTION: Nightmares, Grind teeth really bad   Latex Itching    Itch and blisters from contact   Amitriptyline     Hot, itching, tongue tingling, breakthrough bleeding.   Clindamycin/Lincomycin Hives and Other (See Comments)    Stated that this causes her to get really horse and made her feel really hot   Other Other (See Comments)   Penicillins    Pentosan Polysulfate Sodium Swelling    Throat swelling   Shellfish-Derived Products Other (See Comments)   Sulfa Antibiotics Other (See Comments)   Topamax [Topiramate]     Says it makes her "constipation and she can't urinate normally"   Uribel [Meth-Hyo-M Bl-Na Phos-Ph Sal] Nausea Only   Uribel [Urelle] Swelling and Other (See Comments)    Patient stated this makes her stomach swell and makes her feel si   Lamotrigine Rash   Sulfamethoxazole-Trimethoprim Rash    Patient developed feelings of dizziness, rash and itchy hands.  Patient then vomited and had diarrhea.     Labs/Other Studies Reviewed    The following studies were reviewed today:  Cardiac Studies & Procedures  STRESS TESTS  MYOCARDIAL PERFUSION IMAGING 07/07/2015  Narrative  Nuclear stress EF: 53%.  The left ventricular ejection fraction is mildly decreased (45-54%).  There was no ST segment deviation noted during stress.  Normal myocardial perfusion and function.  Low risk lexiscan nuclear study with minimal insignificant breast attenuation and normal myocardial perfusion and function; EF 53% without wall motion abnormalities and normal systolic thickening.   ECHOCARDIOGRAM  ECHOCARDIOGRAM COMPLETE 03/11/2023  Narrative ECHOCARDIOGRAM REPORT    Patient Name:   Sarah Reeves Date of Exam: 03/11/2023 Medical Rec #:   [id number]       Height:       64.0 in Accession #:    [id number]      Weight:       75.0 lb Date of Birth:  14-Sep-1961       BSA:          1.289 m Patient Age:    61 years        BP:           138/86 mmHg Patient Gender: F               HR:           85 bpm. Exam Location:  [address]  Procedure: 2D Echo, Cardiac Doppler, Color Doppler and Strain Analysis  Indications:    R06.00 Dyspnea  History:        Patient has prior history of Echocardiogram examinations. Signs/Symptoms:Dizziness/Lightheadedness; Risk Factors:Current Smoker.  Sonographer:    Sarah Reeves RCS Referring Phys: [id number] Sarah Reeves  IMPRESSIONS   1. Left ventricular ejection fraction, by estimation, is 60 to 65%. The left ventricle has normal function. The left ventricle has no regional wall motion abnormalities. Left ventricular diastolic parameters are consistent with Grade I diastolic dysfunction (impaired relaxation). The average left ventricular global longitudinal strain is -16.5 %. The global longitudinal strain is normal. 2. Right ventricular systolic function is normal. The right ventricular size is normal. 3. The mitral valve is normal in structure. No evidence of mitral valve regurgitation. No evidence of mitral stenosis. Moderate mitral annular calcification. 4. The aortic valve is tricuspid. Aortic valve regurgitation is not visualized. No aortic stenosis is present. 5. The inferior vena cava is normal in size with greater than 50% respiratory variability, suggesting right atrial pressure of 3 mmHg.  FINDINGS Left Ventricle: Left ventricular ejection fraction, by estimation, is 60 to 65%. The left ventricle has normal function. The left ventricle has no regional wall motion abnormalities. The average left ventricular global longitudinal strain is -16.5 %. The global longitudinal strain is normal. The left ventricular internal cavity size was normal in size. There is no left ventricular hypertrophy.  Left ventricular diastolic parameters are consistent with Grade I diastolic dysfunction (impaired relaxation).  Right Ventricle: The right ventricular size is normal. No increase in right ventricular wall thickness. Right ventricular systolic function is normal.  Left Atrium: Left atrial size was normal in size.  Right Atrium: Right atrial size was normal in size.  Pericardium: There is no evidence of pericardial effusion.  Mitral Valve: The mitral valve is normal in structure. Moderate mitral annular calcification. No evidence of mitral valve regurgitation. No evidence of mitral valve stenosis.  Tricuspid Valve: The tricuspid valve is normal in structure. Tricuspid valve regurgitation is not demonstrated. No evidence of tricuspid stenosis.  Aortic Valve: The aortic valve is tricuspid. Aortic valve regurgitation is not visualized. No aortic stenosis is present.  Pulmonic Valve: The  pulmonic valve was normal in structure. Pulmonic valve regurgitation is not visualized. No evidence of pulmonic stenosis.  Aorta: The aortic root is normal in size and structure.  Venous: The inferior vena cava is normal in size with greater than 50% respiratory variability, suggesting right atrial pressure of 3 mmHg.  IAS/Shunts: No atrial level shunt detected by color flow Doppler.   LEFT VENTRICLE PLAX 2D LVIDd:         4.10 cm   Diastology LVIDs:         2.70 cm   LV e' medial:    8.92 cm/s LV PW:         0.90 cm   LV E/e' medial:  9.0 LV IVS:        1.00 cm   LV e' lateral:   10.20 cm/s LVOT diam:     1.90 cm   LV E/e' lateral: 7.9 LV SV:         69 LV SV Index:   53        2D Longitudinal Strain LVOT Area:     2.84 cm  2D Strain GLS (A2C):   -16.4 % 2D Strain GLS (A3C):   -17.1 % 2D Strain GLS (A4C):   -16.0 % 2D Strain GLS Avg:     -16.5 %  RIGHT VENTRICLE RV Basal diam:  2.80 cm RV S prime:     9.36 cm/s TAPSE (M-mode): 1.9 cm  LEFT ATRIUM             Index        RIGHT ATRIUM            Index LA diam:        3.40 cm 2.64 cm/m   RA Area:     11.10 cm LA Vol (A2C):   29.0 ml 22.49 ml/m  RA Volume:   24.00 ml  18.61 ml/m LA Vol (A4C):   27.1 ml 21.02 ml/m LA Biplane Vol: 28.9 ml 22.41 ml/m AORTIC VALVE LVOT Vmax:   95.00 cm/s LVOT Vmean:  63.900 cm/s LVOT VTI:    0.243 m  AORTA Ao Root diam: 3.40 cm Ao Asc diam:  2.90 cm  MITRAL VALVE MV Area (PHT):             SHUNTS MV Decel Time:             Systemic VTI:  0.24 m MV E velocity: 80.70 cm/s  Systemic Diam: 1.90 cm MV A velocity: 95.40 cm/s MV E/A ratio:  0.85  Donato Schultz MD Electronically signed by Donato Schultz MD Signature Date/Time: 03/11/2023/11:48:53 AM    Final     CT SCANS  CT CORONARY MORPH W/CTA COR W/SCORE 01/19/2023  Addendum 01/19/2023 11:12 PM ADDENDUM REPORT: 01/19/2023 23:09  EXAM: OVER-READ INTERPRETATION  CT CHEST  The following report is an over-read performed by radiologist Dr. Arnoldo Morale [facility] on 01/19/2023. This over-read does not include interpretation of cardiac or coronary anatomy or pathology. The coronary CTA interpretation by the cardiologist is attached.  COMPARISON:  Chest CT 01/02/2023.  FINDINGS: Mild emphysema.  Bilateral breast implants.  IMPRESSION: No significant extracardiac findings.   Electronically Signed By: Elgie Collard M.D. On: 01/19/2023 23:09  Narrative CLINICAL DATA:  66F with chest pain  EXAM: Cardiac/Coronary CTA  TECHNIQUE: The patient was scanned on a [employer].  FINDINGS: A 100 kV prospective scan was triggered in the descending thoracic aorta at 111 HU's. Axial non-contrast 3 mm  slices were carried out through the heart. The data set was analyzed on a dedicated work station and scored using the Agatson method. Gantry rotation speed was 250 msecs and collimation was .6 mm. No beta blockade and 0.8 mg of sl NTG was given. The 3D data set was reconstructed in 5% intervals of the  35-75% of the R-R cycle. Phases were analyzed on a dedicated work station using MPR, MIP and VRT modes. The patient received 100 cc of contrast.  Coronary Arteries:  Normal coronary origin.  Right dominance.  RCA is a large dominant artery that gives rise to PDA and PLA. Noncalcified plaque in proximal RCA causes 0-24% stenosis  Left main is a large artery that gives rise to LAD and LCX arteries.  LAD is a large vessel. Noncalcified plaque in proximal LAD causes 0-24% stenosis. Noncalcified plaque in ostium of large D1 branch causes 50-69% stenosis  LCX is a non-dominant artery that gives rise to one large OM1 branch. There is no plaque.  Other findings:  Left Ventricle: Normal size  Left Atrium: Normal size  Pulmonary Veins: Normal configuration  Right Ventricle: Normal size  Right Atrium: Normal size  Cardiac valves: No calcifications  Thoracic aorta: Normal size  Pulmonary Arteries: Normal size  Systemic Veins: Normal drainage  Pericardium: Normal thickness  IMPRESSION: 1. Coronary calcium score of 0.  2. Total plaque volume 54 mm3 which is 52nd percentile for age- and sex-matched controls (calcified plaque 0 mm3; noncalcified plaque 54 mm3). Total plaque volume is mild  3. Normal coronary origin with right dominance.  4. Noncalcified plaque in ostium of large D1 branch causes moderate (50-69%) stenosis  5. Noncalcified plaque in proximal LAD causes minimal (0-24%) stenosis.  6. Noncalcified plaque in proximal RCA causes minimal (0-24%) stenosis  7. Will send study for CTFFR  CAD-RADS 3. Moderate stenosis. Consider symptom-guided anti-ischemic pharmacotherapy as well as risk factor modification per guideline directed care. Additional analysis with CT FFR will be submitted.  Electronically Signed: By: Epifanio Lesches M.D. On: 01/17/2023 16:19         Recent Labs: 11/08/2022: TSH 2.047 01/24/2023: ALT 27 02/28/2023: BUN 13; Creatinine, Ser  0.87; Hemoglobin 13.0; Platelets 214; Potassium 3.9; Sodium 133  Recent Lipid Panel    Component Value Date/Time   CHOL 272 (H) 01/24/2023 0843   TRIG 514 (H) 01/24/2023 0843   HDL 60 01/24/2023 0843   CHOLHDL 4.5 (H) 01/24/2023 0843   CHOLHDL 3.3 06/27/2018 0623   VLDL 23 06/27/2018 0623   LDLCALC 121 (H) 01/24/2023 0843    History of Present Illness    62 year old female with the above past medical history including nonobstructive CAD, chronic chest pain, hypertension, COPD, tobacco use, hepatitis, and chronic anxiety.  She has a history of chronic chest pain. Lexiscan in 2016 was negative for ischemia. CT of the chest for lung cancer screening in 12/2022 revealed evidence of aortic atherosclerosis, coronary calcifications.  At her follow-up visit in March 2024 she noted intermittent chest discomfort, stable chronic dyspnea in the setting of COPD (follows with pulmonology).  Hydrochlorothiazide was discontinued in setting of chronic hypokalemia.  She was started on olmesartan 20 mg daily.  Coronary CT angiogram revealed nonobstructive CAD.  Echocardiogram in 02/2023 revealed EF 60 to 65%, normal LV function, no RWMA, G1 DD, normal RV systolic function, no significant valvular abnormalities.  She was last seen in the office on 02/18/2023 and noted stable dyspnea in the setting of COPD.  She reported increased alcohol  consumption in the setting of personal stress.  She continued to smoke.  She had not started taking Crestor due to concerns for possible elevated blood sugar as a result of statin therapy.  She was advised to begin Crestor 10 mg daily.   She presents today for follow-up accompanied by her friend. Since her last visit she has been stable from a cardiac standpoint.  She denies any symptoms concerning for angina.  She recently had an echocardiogram with pulmonology that was essentially normal.  She unfortunately did not tolerate Crestor.  She also notes she is continuing to experience  diarrhea and is following with her primary care doctor and GI.  Otherwise, from a cardiac standpoint, she reports feeling well.  Home Medications    Current Outpatient Medications  Medication Sig Dispense Refill   Ascorbic Acid (VITAMIN C) 1000 MG tablet Take 1,000 mg by mouth daily.     aspirin EC 81 MG tablet Take 1 tablet (81 mg total) by mouth daily. 90 tablet 3   Budeson-Glycopyrrol-Formoterol (BREZTRI AEROSPHERE) 160-9-4.8 MCG/ACT AERO Inhale 2 puffs into the lungs in the morning and at bedtime. Alternates between symbicort and Breztri 10.7 g 6   cetirizine (ZYRTEC) 10 MG tablet Take 1 tablet (10 mg total) by mouth daily. 30 tablet 5   chlorpheniramine-HYDROcodone (TUSSIONEX) 10-8 MG/5ML SUER      cholecalciferol (VITAMIN D) 1000 UNITS tablet Take 2,000 Units by mouth daily.      clotrimazole (MYCELEX) 10 MG troche Take 1 tablet (10 mg total) by mouth 5 (five) times daily. 70 tablet 0   cyclobenzaprine (FLEXERIL) 10 MG tablet Take 10 mg by mouth 3 (three) times daily as needed for muscle spasms.     Dextromethorphan-guaiFENesin (TUSSIN DM) 10-100 MG/5ML liquid Take 5 mLs by mouth every 12 (twelve) hours. 200 mL 0   diazepam (VALIUM) 5 MG tablet Take 1 tablet (5 mg total) by mouth 2 (two) times daily. 10 tablet 0   estradiol (VIVELLE-DOT) 0.075 MG/24HR Place onto the skin.     fidaxomicin (DIFICID) 200 MG TABS tablet Take 1 tablet (200 mg total) by mouth 2 (two) times daily. 20 tablet 0   gabapentin (NEURONTIN) 300 MG capsule Take 300 mg by mouth daily as needed (nerve pain).     levalbuterol (XOPENEX HFA) 45 MCG/ACT inhaler INHALE 2 PUFFS INTO THE LUNGS EVERY 4 HOURS AS NEEDED FOR WHEEZING 15 g 1   levalbuterol (XOPENEX) 1.25 MG/3ML nebulizer solution Take 1.25 mg by nebulization every 4 (four) hours as needed for wheezing. 72 mL 5   levofloxacin (LEVAQUIN) 500 MG tablet Take 1 tablet (500 mg total) by mouth daily. 7 tablet 0   LORazepam (ATIVAN) 2 MG tablet Take 2 mg by mouth in the  morning, at noon, and at bedtime.     meclizine (ANTIVERT) 25 MG tablet Take 25 mg by mouth 3 (three) times daily as needed for dizziness.     metoprolol tartrate (LOPRESSOR) 100 MG tablet Take 1 tab 2 hour prior to procedure 1 tablet 0   montelukast (SINGULAIR) 10 MG tablet Take 1 tablet (10 mg total) by mouth at bedtime. 30 tablet 5   nitroGLYCERIN (NITROSTAT) 0.4 MG SL tablet Place 1 tablet (0.4 mg total) under the tongue every 5 (five) minutes as needed for chest pain. 25 tablet 2   nystatin (MYCOSTATIN) 100000 UNIT/ML suspension Take 5 mL swish and swallow by mouth 4 times a day for 5 days 60 mL 0   pantoprazole (PROTONIX) 40 MG  tablet Take 1 tablet (40 mg total) by mouth daily. 90 tablet 1   progesterone (PROMETRIUM) 100 MG capsule Take 200 mg by mouth at bedtime.     rosuvastatin (CRESTOR) 10 MG tablet Take 1 tablet (10 mg total) by mouth daily. 90 tablet 3   valACYclovir (VALTREX) 500 MG tablet Take 500 mg by mouth daily as needed (outbreaks).     Vitamin D, Cholecalciferol, 25 MCG (1000 UT) TABS Take 25 mcg by mouth daily. 60 tablet 3   zolpidem (AMBIEN) 10 MG tablet Take 10 mg by mouth at bedtime.     olmesartan (BENICAR) 20 MG tablet Take 1 tablet (20 mg total) by mouth daily. 90 tablet 3   potassium chloride (KLOR-CON) 10 MEQ tablet Take 1 tablet (10 mEq total) by mouth daily. 30 tablet 3   No current facility-administered medications for this visit.   Facility-Administered Medications Ordered in Other Visits  Medication Dose Route Frequency Provider Last Rate Last Admin   chlorhexidine (HIBICLENS) 4 % liquid 1 application  1 application  Topical Once Sarah Pretty III, MD       chlorhexidine (HIBICLENS) 4 % liquid 1 application  1 application  Topical Once Sarah Pretty III, MD         Review of Systems    She denies chest pain, palpitations, dyspnea, pnd, orthopnea, n, v, dizziness, syncope, edema, weight gain, or early satiety. All other systems reviewed and are otherwise negative  except as noted above.   Physical Exam    VS:  BP 98/60 (BP Location: Left Arm, Patient Position: Sitting, Cuff Size: Normal)   Pulse (!) 110   Ht 5' 4.5" (1.638 m)   Wt 163 lb (73.9 kg)   BMI 27.55 kg/m   GEN: Well nourished, well developed, in no acute distress. HEENT: normal. Neck: Supple, no JVD, carotid bruits, or masses. Cardiac: RRR, no murmurs, rubs, or gallops. No clubbing, cyanosis, edema.  Radials/DP/PT 2+ and equal bilaterally.  Respiratory:  Respirations regular and unlabored, clear to auscultation bilaterally. GI: Soft, nontender, nondistended, BS + x 4. MS: no deformity or atrophy. Skin: warm and dry, no rash. Neuro:  Strength and sensation are intact. Psych: Normal affect.  Accessory Clinical Findings    ECG personally reviewed by me today -sinus tachycardia, 110 bpm- no acute changes.   Lab Results  Component Value Date   WBC 9.6 02/28/2023   HGB 13.0 02/28/2023   HCT 38.5 02/28/2023   MCV 94.8 02/28/2023   PLT 214 02/28/2023   Lab Results  Component Value Date   CREATININE 0.87 02/28/2023   BUN 13 02/28/2023   NA 133 (L) 02/28/2023   K 3.9 02/28/2023   CL 100 02/28/2023   CO2 22 02/28/2023   Lab Results  Component Value Date   ALT 27 01/24/2023   AST 34 01/24/2023   ALKPHOS 71 01/24/2023   BILITOT 0.4 01/24/2023   Lab Results  Component Value Date   CHOL 272 (H) 01/24/2023   HDL 60 01/24/2023   LDLCALC 121 (H) 01/24/2023   TRIG 514 (H) 01/24/2023   CHOLHDL 4.5 (H) 01/24/2023    Lab Results  Component Value Date   HGBA1C 6.0 (H) 03/10/2021    Assessment & Plan   1. CAD/chest pain: She has a longstanding history of intermittent chest discomfort.  Myoview in 2016 was low risk.  CT of the chest for lung cancer screening that showed evidence of aortic atherosclerosis, possible coronary calcifications.  CCTA in 12/2022 revealed nonobstructive  CAD.  Echo in 02/2023 was essentially normal.  She has not tolerated statin therapy.  Continue  aspirin, olmesartan.   2. Hypertension: BP well controlled, borderline low in office today but has been stable otherwise.  She is slightly tachycardic in office today, denies any palpitations, dizziness.  Continue to monitor BP/HR.  For now, continue current antihypertensive regimen.    3. Hyperlipidemia: LDL was 121 in 01/2023.  She did not tolerate statin therapy.  Will refer to lipid clinic Pharm.D. for consideration of alternative lipid-lowering therapy.  4. COPD/tobacco use/OSA: She continues to smoke.  Now on home O2 at night. She notes a history of OSA, no longer on CPAP.  She has noted that her oxygen saturation decreases at nighttime.  Recommend follow-up with pulmonology.  Smoking cessation advised.   5. Anxiety: Ongoing, stable.  Managed per PCP.   6. Disposition: Follow-up scheduled with Dr. Herbie Baltimore in 05/2023.      Joylene Grapes, NP 04/04/2023, 8:38 AM

## 2023-04-04 NOTE — Patient Instructions (Addendum)
Medication Instructions:   Stop Rosuvastatin   *If you need a refill on your cardiac medications before your next appointment, please call your pharmacy*   Lab Work: Not needed If you have labs (blood work) drawn today and your tests are completely normal, you will receive your results only by: MyChart Message (if you have MyChart) OR A paper copy in the mail If you have any lab test that is abnormal or we need to change your treatment, we will call you to review the results.   Testing/Procedures:  Not needed  Follow-Up: At [facility], you and your health needs are our priority.  As part of our continuing mission to provide you with exceptional heart care, we have created designated Provider Care Teams.  These Care Teams include your primary Cardiologist (physician) and Advanced Practice Providers (APPs -  Physician Assistants and Nurse Practitioners) who all work together to provide you with the care you need, when you need it.     Your next appointment:    Keep appt with in Aug 2024  The format for your next appointment:   In Person  Provider:   Bryan Lemma, MD    Other Instructions   You have been referred to [facility]- LIPID clinic - to discuss  options for cholesterol medicaitons

## 2023-04-08 DIAGNOSIS — L659 Nonscarring hair loss, unspecified: Secondary | ICD-10-CM | POA: Diagnosis not present

## 2023-04-18 ENCOUNTER — Ambulatory Visit: Payer: BC Managed Care – PPO | Admitting: Gastroenterology

## 2023-04-22 DIAGNOSIS — G4736 Sleep related hypoventilation in conditions classified elsewhere: Secondary | ICD-10-CM | POA: Diagnosis not present

## 2023-04-22 DIAGNOSIS — J449 Chronic obstructive pulmonary disease, unspecified: Secondary | ICD-10-CM | POA: Diagnosis not present

## 2023-04-25 ENCOUNTER — Ambulatory Visit: Payer: BC Managed Care – PPO | Admitting: Pulmonary Disease

## 2023-04-25 ENCOUNTER — Encounter: Payer: Self-pay | Admitting: Pulmonary Disease

## 2023-04-25 VITALS — BP 102/62 | HR 91 | Ht 63.0 in | Wt 163.6 lb

## 2023-04-25 DIAGNOSIS — J438 Other emphysema: Secondary | ICD-10-CM

## 2023-04-25 DIAGNOSIS — R5383 Other fatigue: Secondary | ICD-10-CM | POA: Diagnosis not present

## 2023-04-25 MED ORDER — LEVALBUTEROL HCL 1.25 MG/3ML IN NEBU: 1.2500 mg | INHALATION_SOLUTION | RESPIRATORY_TRACT | 5 refills | Status: DC | PRN

## 2023-04-25 MED ORDER — BREZTRI AEROSPHERE 160-9-4.8 MCG/ACT IN AERO: 2.0000 | INHALATION_SPRAY | Freq: Two times a day (BID) | RESPIRATORY_TRACT | 6 refills | Status: DC

## 2023-04-25 NOTE — Progress Notes (Signed)
Sarah Reeves    [id number]    1961/07/27  Primary Care Physician:Grewal, Marcelino Duster, MD  Referring Physician: Marcelle Overlie, MD [address] [place],  [place] [address]  Chief complaint:   Patient with history of COPD, asthma  HPI:  Asthma COPD overlap Symptoms are relatively stable Does use Breztri, regular use of albuterol  She does not some rattling in her chest Not really coughing up any secretions at present  Nocturnal desaturate air-on home oxygen at night Saturations are okay during the day  Home sleep study February 2024 with no significant obstructive sleep apnea but did show hypoxemia  Shortness of breath with activity  Ongoing issues with C. difficile/diarrhea  Active smoker    Outpatient Encounter Medications as of 04/25/2023  Medication Sig   Ascorbic Acid (VITAMIN C) 1000 MG tablet Take 1,000 mg by mouth daily.   aspirin EC 81 MG tablet Take 1 tablet (81 mg total) by mouth daily.   cetirizine (ZYRTEC) 10 MG tablet Take 1 tablet (10 mg total) by mouth daily.   chlorpheniramine-HYDROcodone (TUSSIONEX) 10-8 MG/5ML SUER    cholecalciferol (VITAMIN D) 1000 UNITS tablet Take 2,000 Units by mouth daily.    clotrimazole (MYCELEX) 10 MG troche Take 1 tablet (10 mg total) by mouth 5 (five) times daily.   cyclobenzaprine (FLEXERIL) 10 MG tablet Take 10 mg by mouth 3 (three) times daily as needed for muscle spasms.   Dextromethorphan-guaiFENesin (TUSSIN DM) 10-100 MG/5ML liquid Take 5 mLs by mouth every 12 (twelve) hours.   diazepam (VALIUM) 5 MG tablet Take 1 tablet (5 mg total) by mouth 2 (two) times daily.   estradiol (VIVELLE-DOT) 0.075 MG/24HR Place onto the skin.   fidaxomicin (DIFICID) 200 MG TABS tablet Take 1 tablet (200 mg total) by mouth 2 (two) times daily.   gabapentin (NEURONTIN) 300 MG capsule Take 300 mg by mouth daily as needed (nerve pain).   levalbuterol (XOPENEX HFA) 45 MCG/ACT inhaler INHALE 2 PUFFS INTO THE LUNGS  EVERY 4 HOURS AS NEEDED FOR WHEEZING   levofloxacin (LEVAQUIN) 500 MG tablet Take 1 tablet (500 mg total) by mouth daily.   LORazepam (ATIVAN) 2 MG tablet Take 2 mg by mouth in the morning, at noon, and at bedtime.   meclizine (ANTIVERT) 25 MG tablet Take 25 mg by mouth 3 (three) times daily as needed for dizziness.   metoprolol tartrate (LOPRESSOR) 100 MG tablet Take 1 tab 2 hour prior to procedure   montelukast (SINGULAIR) 10 MG tablet Take 1 tablet (10 mg total) by mouth at bedtime.   nitroGLYCERIN (NITROSTAT) 0.4 MG SL tablet Place 1 tablet (0.4 mg total) under the tongue every 5 (five) minutes as needed for chest pain.   nystatin (MYCOSTATIN) 100000 UNIT/ML suspension Take 5 mL swish and swallow by mouth 4 times a day for 5 days   olmesartan (BENICAR) 20 MG tablet Take 1 tablet (20 mg total) by mouth daily.   pantoprazole (PROTONIX) 40 MG tablet Take 1 tablet (40 mg total) by mouth daily.   potassium chloride (KLOR-CON) 10 MEQ tablet Take 1 tablet (10 mEq total) by mouth daily.   progesterone (PROMETRIUM) 100 MG capsule Take 200 mg by mouth at bedtime.   valACYclovir (VALTREX) 500 MG tablet Take 500 mg by mouth daily as needed (outbreaks).   Vitamin D, Cholecalciferol, 25 MCG (1000 UT) TABS Take 25 mcg by mouth daily.   zolpidem (AMBIEN) 10 MG tablet Take 10 mg by  mouth at bedtime.   [DISCONTINUED] Budeson-Glycopyrrol-Formoterol (BREZTRI AEROSPHERE) 160-9-4.8 MCG/ACT AERO Inhale 2 puffs into the lungs in the morning and at bedtime. Alternates between symbicort and Breztri   [DISCONTINUED] levalbuterol (XOPENEX) 1.25 MG/3ML nebulizer solution Take 1.25 mg by nebulization every 4 (four) hours as needed for wheezing.   Budeson-Glycopyrrol-Formoterol (BREZTRI AEROSPHERE) 160-9-4.8 MCG/ACT AERO Inhale 2 puffs into the lungs in the morning and at bedtime. Alternates between symbicort and Breztri   levalbuterol (XOPENEX) 1.25 MG/3ML nebulizer solution Take 1.25 mg by nebulization every 4 (four) hours  as needed for wheezing.   Facility-Administered Encounter Medications as of 04/25/2023  Medication   chlorhexidine (HIBICLENS) 4 % liquid 1 application   chlorhexidine (HIBICLENS) 4 % liquid 1 application    Allergies as of 04/25/2023 - Review Complete 04/25/2023  Allergen Reaction Noted   Keflex [cephalexin] Hives, Itching, and Other (See Comments) 11/09/2022   Shellfish allergy Other (See Comments) 04/30/2013   Vancomycin Anaphylaxis 11/08/2022   Zoloft [sertraline hcl] Other (See Comments) 06/19/2015   Latex Itching 12/26/2014   Amitriptyline  01/28/2018   Clindamycin/lincomycin Hives and Other (See Comments) 06/25/2018   Other Other (See Comments) 10/31/2020   Penicillins  11/09/2018   Pentosan polysulfate sodium Swelling 09/07/2018   Rosuvastatin Other (See Comments) 04/04/2023   Shellfish-derived products Other (See Comments) 09/12/2021   Sulfa antibiotics Other (See Comments) 10/31/2020   Topamax [topiramate]  01/28/2018   Uribel [meth-hyo-m bl-na phos-ph sal] Nausea Only 10/31/2020   Uribel [urelle] Swelling and Other (See Comments) 06/25/2018   Lamotrigine Rash 10/31/2020   Sulfamethoxazole-trimethoprim Rash 10/01/2018    Past Medical History:  Diagnosis Date   Angio-edema    Anxiety    Arthritis    Asthma    Clostridioides difficile infection    Complication of anesthesia    takes alot to sedated   COPD (chronic obstructive pulmonary disease) (HCC)    Depression    Fibromyalgia    GERD (gastroesophageal reflux disease)    Hepatitis C    (Dr. Kinnie Scales) Treated with Harvoni March-May 2016   Herpes simplex    Hyperlipemia    Insomnia    Mental disorder    Neuropathy    PONV (postoperative nausea and vomiting)    Sleep apnea    Urticaria     Past Surgical History:  Procedure Laterality Date   AUGMENTATION MAMMAPLASTY Bilateral    BREAST BIOPSY Left 11/2014   BREAST BIOPSY Left 12/2014   BREAST ENHANCEMENT SURGERY     BREAST LUMPECTOMY WITH RADIOACTIVE  SEED LOCALIZATION Left 12/26/2014   Procedure: LEFT BREAST LUMPECTOMY WITH RADIOACTIVE SEED LOCALIZATION;  Surgeon: Chevis Pretty III, MD;  Location: [facility];  Service: General;  Laterality: Left;   BREAST REDUCTION SURGERY     COLONOSCOPY     EXPLORATORY LAPAROTOMY     NM MYOVIEW LTD  07/07/2015   Normal Myocardial Perfusion Scan. Low risk lexiscan nuclear study with minimal insignificant breast attenuation and normal myocardial perfusion and function; EF 53% without wall motion abnormalities and normal systolic thickening   TEMPOROMANDIBULAR JOINT SURGERY     TUBAL LIGATION     UPPER GI ENDOSCOPY     x5    Family History  Problem Relation Age of Onset   Diabetes Mother    Lung disease Mother    Heart disease Mother    Breast cancer Mother    Heart disease Father    Colon cancer Father    Hypothyroidism Brother    Alzheimer's disease  Maternal Grandmother    Alzheimer's disease Maternal Aunt    Skin cancer Maternal Aunt    Stomach cancer Neg Hx    Esophageal cancer Neg Hx     Social History   Socioeconomic History   Marital status: Married    Spouse name: Not on file   Number of children: 0   Years of education: 12   Highest education level: Not on file  Occupational History   Occupation: Retired   Occupation: retired  Tobacco Use   Smoking status: Every Day    Packs/day: 1.00    Years: 47.00    Additional pack years: 0.00    Total pack years: 47.00    Types: Cigarettes    Passive exposure: Current   Smokeless tobacco: Never   Tobacco comments:    Smoking here and there, not like she was.  02/06/2026 hfb  Vaping Use   Vaping Use: Never used  Substance and Sexual Activity   Alcohol use: Yes    Comment: daily   Drug use: No   Sexual activity: Yes    Birth control/protection: Surgical  Other Topics Concern   Not on file  Social History Narrative   Patient is right handed.   Four cups caffeine per day.   Lives at home with husband.   Social  Determinants of Health   Financial Resource Strain: Not on file  Food Insecurity: Not on file  Transportation Needs: Not on file  Physical Activity: Not on file  Stress: Not on file  Social Connections: Not on file  Intimate Partner Violence: Not on file    Review of Systems  Constitutional:  Positive for fatigue.  Respiratory:  Positive for shortness of breath.   Psychiatric/Behavioral:  Positive for sleep disturbance.     Vitals:   04/25/23 0902  BP: 102/62  Pulse: 91  SpO2: 93%     Physical Exam Constitutional:      Appearance: She is obese.  HENT:     Head: Normocephalic.     Nose: Nose normal.     Mouth/Throat:     Mouth: Mucous membranes are moist.  Eyes:     Pupils: Pupils are equal, round, and reactive to light.  Cardiovascular:     Rate and Rhythm: Normal rate and regular rhythm.     Heart sounds: No murmur heard.    No friction rub.  Pulmonary:     Effort: No respiratory distress.     Breath sounds: No stridor. No wheezing or rhonchi.  Musculoskeletal:     Cervical back: No rigidity or tenderness.  Neurological:     Mental Status: She is alert.  Psychiatric:        Mood and Affect: Mood normal.      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  Nocturnal desaturator  Active smoker  Chronic fatigue  Chronic chronic chronic diarrhea  Some degree of deconditioning may be playing a role in symptoms  Plan/Recommendations: Continue oxygen supplementation  Continue bronchodilator treatments with Breztri and Xopenex as needed  Graded activities as tolerated  Recommended and counseled about the need to quit smoking  Follow-up in about 4 months  With concerns about sleep disordered breathing She had a recent study that was negative for  significant sleep apnea, can consider repeat study if clinical  symptoms dictate that  Encouraged to call with significant concerns  Risk will continue to smoke  I spent 30 minutes dedicated to the care of this patient on the date of this encounter to include previsit review of records, face-to-face time with the patient discussing conditions above, post visit ordering of testing, clinical documentation with electronic health record, making appropriate referrals as documented, and communicated necessary findings to members of the patient's care team  Virl Diamond MD [facility] 04/25/2023, 9:38 AM  CC: Marcelle Overlie, MD

## 2023-04-25 NOTE — Patient Instructions (Signed)
Will see you back in about 4 months  Graded activities as tolerated  Continue to work on quitting smoking  Continue inhaler use  Continue oxygen supplementation at night  We can order a repeat sleep study if symptoms get any worse  Continue weight loss efforts

## 2023-04-30 NOTE — Progress Notes (Addendum)
05/02/2023 Sarah Reeves [id number] 1961-10-01  Referring provider: Marcelle Overlie, MD Primary GI doctor: Dr. Tomasa Rand  ASSESSMENT AND PLAN:   62 Monday I will be at [facility] female with ongoing history of diarrhea, abdominal pain in setting of previous C. difficile  Last colonoscopy Dr. Kinnie Scales 2017, personal history of tubular adenomatous polyps, recall specific 2021 10/09/2022 tested positive C. difficile, treated with vancomycin, repeat C. difficile - 12/2022 Patient drinks 4 vodka drinks nightly, admitted to having 4 drinks prior to this visit even, boyfriend was driving. Very tearful, states she cannot have SSRIs. I do think the majority of her diarrhea and abdominal pain may be alcohol induced/IBS, however patient is overdue for colonoscopy - will plan on repeating colonoscopy in the hospital due to patient being on oxygen at night with Dr. Tomasa Rand to rule out microscopic colitis.   -Will recheck colostrum difficile prior to this to ensure there is no infection but I doubt at this point there is. -Encourage patient to follow-up with primary care, needs resources for alcohol cessation, cognitive behavioral therapy. -Add fiber -consider switching Benicar to losartan/valsartan, doubtful this is enteropathy but could switch  Gastroesophageal reflux disease with left upper quadrant abdominal pain In setting of chronic alcohol use, with regurgitation Last endoscopy 2017, previous with multiple dilations which patient states does not help, previous unremarkable right upper quadrant ultrasound -Will plan for endoscopic evaluation with possible dilation at the hospital with her colonoscopy with Dr. Tomasa Rand, if this is negative would suggest barium swallow. -Stop alcohol -Continue PPI, increase to twice daily -Consider Carafate  Chronic respiratory failure with hypoxia (HCC) Patient on oxygen at night, will need to be done at the hospital. Would encourage sleep  apnea.  Alcohol dependence with unspecified alcohol-induced disorder (HCC) -Alcohol Abstinence counseling discussed with patient as continued use is strongly associated with worsening liver disease progression - get on MVIT - resources given, discuss with PCP  Patient Care Team: Marcelle Overlie, MD as PCP - General (Obstetrics and Gynecology) Marykay Lex, MD as PCP - Cardiology (Cardiology) Newman Pies, MD as Consulting Physician (Otolaryngology) Marcelle Overlie, MD as Consulting Physician (Obstetrics and Gynecology)  HISTORY OF PRESENT ILLNESS: 62 y.o. female with a past medical history of nonobstructive CAD, hypertension, COPD with chronic hypoxia with O2 at night, tobacco abuse, chronic anxiety, hepatitis C 2008 status post eradication Harvoni and others listed below presents for evaluation of right upper quadrant abdominal pain and diarrhea.  10/16/2022 office visit Dr. Tomasa Rand for the same reasons. Suspected symptoms were due to gut flora changes from antibiotic for tick bite. Patient has positive C. difficile treated with vancomycin, CRP negative, pancreatic elastase normal, potassium low at 3 abdominal ultrasound showed steatosis but no cholelithiasis  She states she continues to have diarrhea.  Negative Cdiff 01/09/2023.  She states to leave in the morning, she has to take 1.5 imodium.  She will have about 5 Bm's loose with pieces in it between morning and lunch, nothing later in the evening or nocturnal symptoms.  with mild lower AB pain, if she takes imodium will help but an not have Bm's for days.  She states she has urinary and fecal incontinence as well.  She states she has seen urology and wanted to test for neurological issues, she states she is on "muscle relaxer" suppositories, will have urinary spasms with screaming.  Has had one time after taking imodium she did not have BM for days.  She has left upper AB pain occ, she  states she takes potassium for that. No  hematochezia or melena.   Denies weight loss.  Wt Readings from Last 3 Encounters:  05/02/23 164 lb (74.4 kg)  04/25/23 163 lb 9.6 oz (74.2 kg)  04/04/23 163 lb (73.9 kg)   She states she is very stressed about her health, medications, and still has not recovered from her husband of 35 years died a year and half ago in her arms from MI.  She states she drinks 3-4 vodka drinks a day for almost a year and she is also on valium and ambien.  Patient admits to even having 4 vodka drinks prior to coming here today, she does not drive anymore, her boyfriend drives.  She states she has had intermittent dysphagia with regurg for years, worse steak/breads. She has GERD badly can wake up with bile in her mouth.   Colonoscopy April 20, 2015 Indication: 5-year surveillance Normal colonoscopy Recommended repeat in 5 years because of personal history of adenoma  She  reports that she has been smoking cigarettes. She has a 47 pack-year smoking history. She has been exposed to tobacco smoke. She has never used smokeless tobacco. She reports current alcohol use. She reports that she does not use drugs.  We received the following records from Dr. Jennye Boroughs office   Colonoscopy September 07, 2001 Indication: Diarrhea, hematochezia Normal colon, patient recommended to repeat in 5 years (family history of polyps)   EGD September 07, 2001 Indication: Dysphagia, reflux symptoms Esophagus, no esophagitis or stricture Dilation performed with Savary dilator to 17 mm, no heme or resistance   EGD June 10, 2003 Indication: Dysphagia, 5 days of melena Normal endoscopy Dilation performed with Savary dilator to 18 mm   Colonoscopy August 21, 2006 Indication family history of colorectal neoplasia Normal colonoscopy Recommended repeat 5 years   Colonoscopy June 28, 2010 Indication: Hemoccult positive stool 2 diminutive polyps in the rectosigmoid colon removed with forceps Pathology consistent with  prolapsed polyp, negative for dysplasia/adenoma   EGD October 04, 2010 Indication: Dysphagia Dilation performed with savory dilator to 18 mm, no resistance or heme   Colonoscopy April 20, 2015 Indication: 5-year surveillance Normal colonoscopy Recommended repeat in 5 years because of personal history of adenoma   EGD April 20, 2015 Indication: Dysphagia  Normal EGD Dilation performed with Savary dilator to 18 mm, no resistance or heme   Liver biopsy January 12, 2002 Pathology showed findings consistent with chronic minimally active hepatitis C, inflammatory grade 1 and fibrosis stage 0    RELEVANT LABS AND IMAGING: CBC    Component Value Date/Time   WBC 9.6 02/28/2023 0834   RBC 4.06 02/28/2023 0834   HGB 13.0 02/28/2023 0834   HCT 38.5 02/28/2023 0834   PLT 214 02/28/2023 0834   MCV 94.8 02/28/2023 0834   MCH 32.0 02/28/2023 0834   MCHC 33.8 02/28/2023 0834   RDW 14.3 02/28/2023 0834   LYMPHSABS 3.0 11/08/2022 0917   MONOABS 0.8 11/08/2022 0917   EOSABS 0.1 11/08/2022 0917   BASOSABS 0.1 11/08/2022 0917   Recent Labs    08/26/22 1308 11/08/22 0917 02/28/23 0834  HGB 13.8 14.0 13.0    CMP     Component Value Date/Time   NA 133 (L) 02/28/2023 0834   NA 140 01/24/2023 0842   K 3.9 02/28/2023 0834   CL 100 02/28/2023 0834   CO2 22 02/28/2023 0834   GLUCOSE 136 (H) 02/28/2023 0834   BUN 13 02/28/2023 0834   BUN 19 01/24/2023 0842  CREATININE 0.87 02/28/2023 0834   CALCIUM 8.8 (L) 02/28/2023 0834   CALCIUM 9.1 11/08/2022 0917   PROT 6.8 01/24/2023 0842   ALBUMIN 4.4 01/24/2023 0842   AST 34 01/24/2023 0842   ALT 27 01/24/2023 0842   ALKPHOS 71 01/24/2023 0842   BILITOT 0.4 01/24/2023 0842   GFRNONAA >60 02/28/2023 0834   GFRAA 101 11/10/2018 1014      Latest Ref Rng & Units 01/24/2023    8:42 AM 08/26/2022    1:08 PM 11/16/2021   10:59 AM  Hepatic Function  Total Protein 6.0 - 8.5 g/dL 6.8  7.1  7.1   Albumin 3.8 - 4.9 g/dL 4.4  4.1  4.3   AST 0 - 40  IU/L 34  33  18   ALT 0 - 32 IU/L 27  22  16    Alk Phosphatase 44 - 121 IU/L 71  60  52   Total Bilirubin 0.0 - 1.2 mg/dL 0.4  1.4  0.6       Current Medications:   Current Outpatient Medications (Endocrine & Metabolic):    estradiol (VIVELLE-DOT) 0.075 MG/24HR, Place onto the skin.   progesterone (PROMETRIUM) 100 MG capsule, Take 200 mg by mouth at bedtime.   Current Outpatient Medications (Cardiovascular):    fenofibrate (TRICOR) 145 MG tablet, Take 1 tablet (145 mg total) by mouth daily.   metoprolol tartrate (LOPRESSOR) 100 MG tablet, Take 1 tab 2 hour prior to procedure   nitroGLYCERIN (NITROSTAT) 0.4 MG SL tablet, Place 1 tablet (0.4 mg total) under the tongue every 5 (five) minutes as needed for chest pain.   olmesartan (BENICAR) 20 MG tablet, Take 1 tablet (20 mg total) by mouth daily.   pravastatin (PRAVACHOL) 20 MG tablet, Take 1 tablet (20 mg total) by mouth every evening.   Current Outpatient Medications (Respiratory):    Budeson-Glycopyrrol-Formoterol (BREZTRI AEROSPHERE) 160-9-4.8 MCG/ACT AERO, Inhale 2 puffs into the lungs in the morning and at bedtime. Alternates between symbicort and Breztri   cetirizine (ZYRTEC) 10 MG tablet, Take 1 tablet (10 mg total) by mouth daily.   chlorpheniramine-HYDROcodone (TUSSIONEX) 10-8 MG/5ML SUER,    Dextromethorphan-guaiFENesin (TUSSIN DM) 10-100 MG/5ML liquid, Take 5 mLs by mouth every 12 (twelve) hours.   levalbuterol (XOPENEX HFA) 45 MCG/ACT inhaler, INHALE 2 PUFFS INTO THE LUNGS EVERY 4 HOURS AS NEEDED FOR WHEEZING   levalbuterol (XOPENEX) 1.25 MG/3ML nebulizer solution, Take 1.25 mg by nebulization every 4 (four) hours as needed for wheezing.   montelukast (SINGULAIR) 10 MG tablet, Take 1 tablet (10 mg total) by mouth at bedtime.   Current Outpatient Medications (Analgesics):    aspirin EC 81 MG tablet, Take 1 tablet (81 mg total) by mouth daily.     Current Outpatient Medications (Other):    Ascorbic Acid (VITAMIN C) 1000  MG tablet, Take 1,000 mg by mouth daily.   cholecalciferol (VITAMIN D) 1000 UNITS tablet, Take 2,000 Units by mouth daily.    clotrimazole (MYCELEX) 10 MG troche, Take 1 tablet (10 mg total) by mouth 5 (five) times daily.   cyclobenzaprine (FLEXERIL) 10 MG tablet, Take 10 mg by mouth 3 (three) times daily as needed for muscle spasms.   diazepam (VALIUM) 5 MG tablet, Take 1 tablet (5 mg total) by mouth 2 (two) times daily.   fidaxomicin (DIFICID) 200 MG TABS tablet, Take 1 tablet (200 mg total) by mouth 2 (two) times daily.   gabapentin (NEURONTIN) 300 MG capsule, Take 300 mg by mouth daily as needed (nerve pain).  levofloxacin (LEVAQUIN) 500 MG tablet, Take 1 tablet (500 mg total) by mouth daily.   LORazepam (ATIVAN) 2 MG tablet, Take 2 mg by mouth in the morning, at noon, and at bedtime.   meclizine (ANTIVERT) 25 MG tablet, Take 25 mg by mouth 3 (three) times daily as needed for dizziness.   Na Sulfate-K Sulfate-Mg Sulf 17.5-3.13-1.6 GM/177ML SOLN, Take 1 kit by mouth once for 1 dose.   nystatin (MYCOSTATIN) 100000 UNIT/ML suspension, Take 5 mL swish and swallow by mouth 4 times a day for 5 days   pantoprazole (PROTONIX) 40 MG tablet, Take 1 tablet (40 mg total) by mouth daily.   potassium chloride (KLOR-CON) 10 MEQ tablet, Take 1 tablet (10 mEq total) by mouth daily.   valACYclovir (VALTREX) 500 MG tablet, Take 500 mg by mouth daily as needed (outbreaks).   Vitamin D, Cholecalciferol, 25 MCG (1000 UT) TABS, Take 25 mcg by mouth daily.   zolpidem (AMBIEN) 10 MG tablet, Take 10 mg by mouth at bedtime.   Facility-Administered Medications Ordered in Other Visits (Other):    chlorhexidine (HIBICLENS) 4 % liquid 1 application   chlorhexidine (HIBICLENS) 4 % liquid 1 application No current facility-administered medications for this visit.  Medical History:  Past Medical History:  Diagnosis Date   Angio-edema    Anxiety    Arthritis    Asthma    Clostridioides difficile infection     Complication of anesthesia    takes alot to sedated   COPD (chronic obstructive pulmonary disease) (HCC)    Depression    Fibromyalgia    GERD (gastroesophageal reflux disease)    Hepatitis C    (Dr. Kinnie Scales) Treated with Harvoni March-May 2016   Herpes simplex    Hyperlipemia    Insomnia    Mental disorder    Neuropathy    PONV (postoperative nausea and vomiting)    Sleep apnea    Urticaria    Allergies:  Allergies  Allergen Reactions   Keflex [Cephalexin] Hives, Itching and Other (See Comments)    Tongue and throat itching/tingling   Shellfish Allergy Other (See Comments)    Unknown reaction; "MD told me I was allergic"   Vancomycin Anaphylaxis    Face and tongue swells   Zoloft [Sertraline Hcl] Other (See Comments)    REACTION: Nightmares, Grind teeth really bad   Latex Itching    Itch and blisters from contact   Amitriptyline     Hot, itching, tongue tingling, breakthrough bleeding.   Clindamycin/Lincomycin Hives and Other (See Comments)    Stated that this causes her to get really horse and made her feel really hot   Other Other (See Comments)   Penicillins    Pentosan Polysulfate Sodium Swelling    Throat swelling   Rosuvastatin Other (See Comments)   Shellfish-Derived Products Other (See Comments)   Sulfa Antibiotics Other (See Comments)   Topamax [Topiramate]     Says it makes her "constipation and she can't urinate normally"   Uribel [Meth-Hyo-M Bl-Na Phos-Ph Sal] Nausea Only   Uribel [Urelle] Swelling and Other (See Comments)    Patient stated this makes her stomach swell and makes her feel si   Lamotrigine Rash   Sulfamethoxazole-Trimethoprim Rash    Patient developed feelings of dizziness, rash and itchy hands.  Patient then vomited and had diarrhea.     Surgical History:  She  has a past surgical history that includes Breast reduction surgery; Breast enhancement surgery; Temporomandibular joint surgery; Tubal ligation; Exploratory laparotomy;  Colonoscopy; Upper gi endoscopy; Breast lumpectomy with radioactive seed localization (Left, 12/26/2014); NM MYOVIEW LTD (07/07/2015); Augmentation mammaplasty (Bilateral); Breast biopsy (Left, 11/2014); and Breast biopsy (Left, 12/2014). Family History:  Her family history includes Alzheimer's disease in her maternal aunt and maternal grandmother; Breast cancer in her mother; Colon cancer in her father; Diabetes in her mother; Heart disease in her father and mother; Hypothyroidism in her brother; Lung disease in her mother; Skin cancer in her maternal aunt.  REVIEW OF SYSTEMS  : All other systems reviewed and negative except where noted in the History of Present Illness.  PHYSICAL EXAM: BP 120/80 (BP Location: Left Arm, Patient Position: Sitting, Cuff Size: Normal)   Pulse 80 Comment: irregular  Ht 5\' 3"  (1.6 m)   Wt 164 lb (74.4 kg)   BMI 29.05 kg/m  General Appearance: Patient appears inebriated, difficult to keep on task, slurring words. Head:   Normocephalic and atraumatic. Eyes:  sclerae anicteric,conjunctive pink  Respiratory: Respiratory effort normal, BS equal bilaterally without rales, rhonchi, wheezing. Cardio: RRR with no MRGs. Peripheral pulses intact.  Abdomen: Soft,  Obese ,active bowel sounds. mild tenderness in the epigastrium and in the lower abdomen. Without guarding and Without rebound. No masses. Rectal: Not evaluated Musculoskeletal: Full ROM, Normal gait. Without edema. Skin:  Dry and intact without significant lesions or rashes Neuro: Alert and  oriented x4;  No focal deficits. Psych:  Cooperative.  Tearful, slurring of words but no focal deficits.   Doree Albee, PA-C 3:45 PM

## 2023-05-01 ENCOUNTER — Ambulatory Visit: Payer: BC Managed Care – PPO

## 2023-05-01 ENCOUNTER — Encounter: Payer: Self-pay | Admitting: Student

## 2023-05-01 ENCOUNTER — Ambulatory Visit: Payer: BC Managed Care – PPO | Attending: Internal Medicine | Admitting: Student

## 2023-05-01 DIAGNOSIS — E785 Hyperlipidemia, unspecified: Secondary | ICD-10-CM | POA: Diagnosis not present

## 2023-05-01 DIAGNOSIS — E7849 Other hyperlipidemia: Secondary | ICD-10-CM | POA: Diagnosis not present

## 2023-05-01 MED ORDER — FENOFIBRATE 145 MG PO TABS: 145.0000 mg | ORAL_TABLET | Freq: Every day | ORAL | 3 refills | Status: DC

## 2023-05-01 MED ORDER — EZETIMIBE 10 MG PO TABS: 10.0000 mg | ORAL_TABLET | Freq: Every day | ORAL | 3 refills | Status: DC

## 2023-05-01 MED ORDER — PRAVASTATIN SODIUM 20 MG PO TABS: 20.0000 mg | ORAL_TABLET | Freq: Every evening | ORAL | 3 refills | Status: DC

## 2023-05-01 NOTE — Progress Notes (Signed)
Patient ID: [name]                 DOB: September 18, 1961                    MRN: [id number]      HPI: Sarah Reeves is a 62 y.o. female patient referred to lipid clinic by Sarah Reeves. PMH is significant for CAD, chronic chest pain, hypertension, COPD, tobacco use, hepatitis, and chronic anxiety   Patient presented today for lipid clinic reports she had tried Crestor and it was causing her jaw pain and lower leg pain. She has been dealing with diarrhea for long time fist she was diagnosed with c.diff which was cleared but she still has ongoing diarrhea. Her GI doctor told he r not to eat any thigh with rough edges (basically fiber containing whole grain and vegetables). She has lost her husband and she is going through lots of stress so she has been coping her stress with alcohol. Drinks 3-4 drinks per night. She has weak legs so does not do exercise.    Reviewed LDLc and TG lowering options mainly fibrates and statins. Discussed importance of lifestyle modification including cutting down on EtOh to lower TG   Current Medications: none Intolerances: Crestor 10 mg - myalgia, jaw pain and lower leg pain Risk Factors: CAD, smoking  LDL goal: <70 mg/dl TG <409 mg/dl   Diet:  due to chronic diarrhea she is on low fiber diet greens and whole grain food worsen GI symptoms   Exercise: allergies/COPD and diarrhea limiting exercise capacity  Family History: Aunts from mother side: MI  Biological father : heart problems  Mother: diabetes  Social History:  Alcohol:3- 4 drinks per day  Tobacco smoking: 3-4 per day   Labs: Lipid Panel     Component Value Date/Time   CHOL 272 (H) 01/24/2023 0843   TRIG 514 (H) 01/24/2023 0843   HDL 60 01/24/2023 0843   CHOLHDL 4.5 (H) 01/24/2023 0843   CHOLHDL 3.3 06/27/2018 0623   VLDL 23 06/27/2018 0623   LDLCALC 121 (H) 01/24/2023 0843   LABVLDL 91 (H) 01/24/2023 0843    Past Medical History:  Diagnosis Date   Angio-edema    Anxiety     Arthritis    Asthma    Clostridioides difficile infection    Complication of anesthesia    takes alot to sedated   COPD (chronic obstructive pulmonary disease) (HCC)    Depression    Fibromyalgia    GERD (gastroesophageal reflux disease)    Hepatitis C    (Dr. Kinnie Reeves) Treated with Harvoni March-May 2016   Herpes simplex    Hyperlipemia    Insomnia    Mental disorder    Neuropathy    PONV (postoperative nausea and vomiting)    Sleep apnea    Urticaria     Current Outpatient Medications on File Prior to Visit  Medication Sig Dispense Refill   Ascorbic Acid (VITAMIN C) 1000 MG tablet Take 1,000 mg by mouth daily.     aspirin EC 81 MG tablet Take 1 tablet (81 mg total) by mouth daily. 90 tablet 3   Budeson-Glycopyrrol-Formoterol (BREZTRI AEROSPHERE) 160-9-4.8 MCG/ACT AERO Inhale 2 puffs into the lungs in the morning and at bedtime. Alternates between symbicort and Breztri 10.7 g 6   cetirizine (ZYRTEC) 10 MG tablet Take 1 tablet (10 mg total) by mouth daily. 30 tablet 5   chlorpheniramine-HYDROcodone (TUSSIONEX) 10-8 MG/5ML SUER  cholecalciferol (VITAMIN D) 1000 UNITS tablet Take 2,000 Units by mouth daily.      clotrimazole (MYCELEX) 10 MG troche Take 1 tablet (10 mg total) by mouth 5 (five) times daily. 70 tablet 0   cyclobenzaprine (FLEXERIL) 10 MG tablet Take 10 mg by mouth 3 (three) times daily as needed for muscle spasms.     Dextromethorphan-guaiFENesin (TUSSIN DM) 10-100 MG/5ML liquid Take 5 mLs by mouth every 12 (twelve) hours. 200 mL 0   diazepam (VALIUM) 5 MG tablet Take 1 tablet (5 mg total) by mouth 2 (two) times daily. 10 tablet 0   estradiol (VIVELLE-DOT) 0.075 MG/24HR Place onto the skin.     fidaxomicin (DIFICID) 200 MG TABS tablet Take 1 tablet (200 mg total) by mouth 2 (two) times daily. 20 tablet 0   gabapentin (NEURONTIN) 300 MG capsule Take 300 mg by mouth daily as needed (nerve pain).     levalbuterol (XOPENEX HFA) 45 MCG/ACT inhaler INHALE 2 PUFFS INTO THE  LUNGS EVERY 4 HOURS AS NEEDED FOR WHEEZING 15 g 1   levalbuterol (XOPENEX) 1.25 MG/3ML nebulizer solution Take 1.25 mg by nebulization every 4 (four) hours as needed for wheezing. 72 mL 5   levofloxacin (LEVAQUIN) 500 MG tablet Take 1 tablet (500 mg total) by mouth daily. 7 tablet 0   LORazepam (ATIVAN) 2 MG tablet Take 2 mg by mouth in the morning, at noon, and at bedtime.     meclizine (ANTIVERT) 25 MG tablet Take 25 mg by mouth 3 (three) times daily as needed for dizziness.     metoprolol tartrate (LOPRESSOR) 100 MG tablet Take 1 tab 2 hour prior to procedure 1 tablet 0   montelukast (SINGULAIR) 10 MG tablet Take 1 tablet (10 mg total) by mouth at bedtime. 30 tablet 5   nitroGLYCERIN (NITROSTAT) 0.4 MG SL tablet Place 1 tablet (0.4 mg total) under the tongue every 5 (five) minutes as needed for chest pain. 25 tablet 2   nystatin (MYCOSTATIN) 100000 UNIT/ML suspension Take 5 mL swish and swallow by mouth 4 times a day for 5 days 60 mL 0   olmesartan (BENICAR) 20 MG tablet Take 1 tablet (20 mg total) by mouth daily. 90 tablet 3   pantoprazole (PROTONIX) 40 MG tablet Take 1 tablet (40 mg total) by mouth daily. 90 tablet 1   potassium chloride (KLOR-CON) 10 MEQ tablet Take 1 tablet (10 mEq total) by mouth daily. 30 tablet 3   progesterone (PROMETRIUM) 100 MG capsule Take 200 mg by mouth at bedtime.     valACYclovir (VALTREX) 500 MG tablet Take 500 mg by mouth daily as needed (outbreaks).     Vitamin D, Cholecalciferol, 25 MCG (1000 UT) TABS Take 25 mcg by mouth daily. 60 tablet 3   zolpidem (AMBIEN) 10 MG tablet Take 10 mg by mouth at bedtime.     Current Facility-Administered Medications on File Prior to Visit  Medication Dose Route Frequency Provider Last Rate Last Admin   chlorhexidine (HIBICLENS) 4 % liquid 1 application  1 application  Topical Once Sarah Pretty III, MD       chlorhexidine (HIBICLENS) 4 % liquid 1 application  1 application  Topical Once Sarah Miner, MD        Allergies   Allergen Reactions   Keflex [Cephalexin] Hives, Itching and Other (See Comments)    Tongue and throat itching/tingling   Shellfish Allergy Other (See Comments)    Unknown reaction; "MD told me I was allergic"   Vancomycin  Anaphylaxis    Face and tongue swells   Zoloft [Sertraline Hcl] Other (See Comments)    REACTION: Nightmares, Grind teeth really bad   Latex Itching    Itch and blisters from contact   Amitriptyline     Hot, itching, tongue tingling, breakthrough bleeding.   Clindamycin/Lincomycin Hives and Other (See Comments)    Stated that this causes her to get really horse and made her feel really hot   Other Other (See Comments)   Penicillins    Pentosan Polysulfate Sodium Swelling    Throat swelling   Rosuvastatin Other (See Comments)   Shellfish-Derived Products Other (See Comments)   Sulfa Antibiotics Other (See Comments)   Topamax [Topiramate]     Says it makes her "constipation and she can't urinate normally"   Uribel [Meth-Hyo-M Bl-Na Phos-Ph Sal] Nausea Only   Uribel [Urelle] Swelling and Other (See Comments)    Patient stated this makes her stomach swell and makes her feel si   Lamotrigine Rash   Sulfamethoxazole-Trimethoprim Rash    Patient developed feelings of dizziness, rash and itchy hands.  Patient then vomited and had diarrhea.    Assessment/Plan:  1. Hyperlipidemia -  Problem  Hyperlipidemia   Hyperlipidemia Assessment:  LDL goal: < 70 mg/dl  and TG < 161 mg/dl last LDLc 096 mg/dl TG 045 mg/dl Currently not on any medications  Intolerance to moderate intensity statins - Crestor- myalgia and jaw pain Due to chronic upset stomach can't follow high fiber diet Due to weak lower legs can not walk far  EtOH 3-4 drinks per week Reviewed LDLc and TG lowering options mainly fibrates and statins. Discussed importance of lifestyle modification including cutting down on EtOh to lower TG   Plan: Start taking Tricor 145 mg daily daily if tolerated well in 2  weeks start pravastatin 20 mg daily Will repeat LFT in 4 weeks and FLP in 12 weeks  Patient to cut down on alcohol intake and cut down on smoking   Follow up in 4 weeks to assess tolerability  In future Can add ezetimibe or [id number] for LDLc lowering and Vascepa if TG is not at goal      Thank you,  Carmela Hurt, Pharm.D [facility] A Division of [facility] [address] [place], [place] [address]  Phone: [phone]; Fax: [phone]

## 2023-05-01 NOTE — Progress Notes (Deleted)
Patient ID: Sarah Reeves                 DOB: 04-26-1961                    MRN: [id number]      HPI: Sarah Reeves is a 62 y.o. female patient referred to lipid clinic by Sarah Reeves. PMH is significant for    Reviewed options for lowering LDL cholesterol, including ezetimibe, PCSK-9 inhibitors, bempedoic acid and inclisiran.  Discussed mechanisms of action, dosing, side effects and potential decreases in LDL cholesterol.  Also reviewed cost information and potential options for patient assistance.  Current Medications:  Intolerances:  Risk Factors:  LDL goal:   Diet:   Exercise:   Family History:   Social History:   Labs: Lipid Panel     Component Value Date/Time   CHOL 272 (H) 01/24/2023 0843   TRIG 514 (H) 01/24/2023 0843   HDL 60 01/24/2023 0843   CHOLHDL 4.5 (H) 01/24/2023 0843   CHOLHDL 3.3 06/27/2018 0623   VLDL 23 06/27/2018 0623   LDLCALC 121 (H) 01/24/2023 0843   LABVLDL 91 (H) 01/24/2023 0843    Past Medical History:  Diagnosis Date   Angio-edema    Anxiety    Arthritis    Asthma    Clostridioides difficile infection    Complication of anesthesia    takes alot to sedated   COPD (chronic obstructive pulmonary disease) (HCC)    Depression    Fibromyalgia    GERD (gastroesophageal reflux disease)    Hepatitis C    (Dr. Kinnie Scales) Treated with Harvoni March-May 2016   Herpes simplex    Hyperlipemia    Insomnia    Mental disorder    Neuropathy    PONV (postoperative nausea and vomiting)    Sleep apnea    Urticaria     Current Outpatient Medications on File Prior to Visit  Medication Sig Dispense Refill   Ascorbic Acid (VITAMIN C) 1000 MG tablet Take 1,000 mg by mouth daily.     aspirin EC 81 MG tablet Take 1 tablet (81 mg total) by mouth daily. 90 tablet 3   Budeson-Glycopyrrol-Formoterol (BREZTRI AEROSPHERE) 160-9-4.8 MCG/ACT AERO Inhale 2 puffs into the lungs in the morning and at bedtime. Alternates between symbicort and Breztri 10.7 g 6    cetirizine (ZYRTEC) 10 MG tablet Take 1 tablet (10 mg total) by mouth daily. 30 tablet 5   chlorpheniramine-HYDROcodone (TUSSIONEX) 10-8 MG/5ML SUER      cholecalciferol (VITAMIN D) 1000 UNITS tablet Take 2,000 Units by mouth daily.      clotrimazole (MYCELEX) 10 MG troche Take 1 tablet (10 mg total) by mouth 5 (five) times daily. 70 tablet 0   cyclobenzaprine (FLEXERIL) 10 MG tablet Take 10 mg by mouth 3 (three) times daily as needed for muscle spasms.     Dextromethorphan-guaiFENesin (TUSSIN DM) 10-100 MG/5ML liquid Take 5 mLs by mouth every 12 (twelve) hours. 200 mL 0   diazepam (VALIUM) 5 MG tablet Take 1 tablet (5 mg total) by mouth 2 (two) times daily. 10 tablet 0   estradiol (VIVELLE-DOT) 0.075 MG/24HR Place onto the skin.     fidaxomicin (DIFICID) 200 MG TABS tablet Take 1 tablet (200 mg total) by mouth 2 (two) times daily. 20 tablet 0   gabapentin (NEURONTIN) 300 MG capsule Take 300 mg by mouth daily as needed (nerve pain).     levalbuterol (XOPENEX HFA) 45 MCG/ACT inhaler INHALE 2  PUFFS INTO THE LUNGS EVERY 4 HOURS AS NEEDED FOR WHEEZING 15 g 1   levalbuterol (XOPENEX) 1.25 MG/3ML nebulizer solution Take 1.25 mg by nebulization every 4 (four) hours as needed for wheezing. 72 mL 5   levofloxacin (LEVAQUIN) 500 MG tablet Take 1 tablet (500 mg total) by mouth daily. 7 tablet 0   LORazepam (ATIVAN) 2 MG tablet Take 2 mg by mouth in the morning, at noon, and at bedtime.     meclizine (ANTIVERT) 25 MG tablet Take 25 mg by mouth 3 (three) times daily as needed for dizziness.     metoprolol tartrate (LOPRESSOR) 100 MG tablet Take 1 tab 2 hour prior to procedure 1 tablet 0   montelukast (SINGULAIR) 10 MG tablet Take 1 tablet (10 mg total) by mouth at bedtime. 30 tablet 5   nitroGLYCERIN (NITROSTAT) 0.4 MG SL tablet Place 1 tablet (0.4 mg total) under the tongue every 5 (five) minutes as needed for chest pain. 25 tablet 2   nystatin (MYCOSTATIN) 100000 UNIT/ML suspension Take 5 mL swish and  swallow by mouth 4 times a day for 5 days 60 mL 0   olmesartan (BENICAR) 20 MG tablet Take 1 tablet (20 mg total) by mouth daily. 90 tablet 3   pantoprazole (PROTONIX) 40 MG tablet Take 1 tablet (40 mg total) by mouth daily. 90 tablet 1   potassium chloride (KLOR-CON) 10 MEQ tablet Take 1 tablet (10 mEq total) by mouth daily. 30 tablet 3   progesterone (PROMETRIUM) 100 MG capsule Take 200 mg by mouth at bedtime.     valACYclovir (VALTREX) 500 MG tablet Take 500 mg by mouth daily as needed (outbreaks).     Vitamin D, Cholecalciferol, 25 MCG (1000 UT) TABS Take 25 mcg by mouth daily. 60 tablet 3   zolpidem (AMBIEN) 10 MG tablet Take 10 mg by mouth at bedtime.     Current Facility-Administered Medications on File Prior to Visit  Medication Dose Route Frequency Provider Last Rate Last Admin   chlorhexidine (HIBICLENS) 4 % liquid 1 application  1 application  Topical Once Chevis Pretty III, MD       chlorhexidine (HIBICLENS) 4 % liquid 1 application  1 application  Topical Once Chevis Pretty III, MD        Allergies  Allergen Reactions   Keflex [Cephalexin] Hives, Itching and Other (See Comments)    Tongue and throat itching/tingling   Shellfish Allergy Other (See Comments)    Unknown reaction; "MD told me I was allergic"   Vancomycin Anaphylaxis    Face and tongue swells   Zoloft [Sertraline Hcl] Other (See Comments)    REACTION: Nightmares, Grind teeth really bad   Latex Itching    Itch and blisters from contact   Amitriptyline     Hot, itching, tongue tingling, breakthrough bleeding.   Clindamycin/Lincomycin Hives and Other (See Comments)    Stated that this causes her to get really horse and made her feel really hot   Other Other (See Comments)   Penicillins    Pentosan Polysulfate Sodium Swelling    Throat swelling   Rosuvastatin Other (See Comments)   Shellfish-Derived Products Other (See Comments)   Sulfa Antibiotics Other (See Comments)   Topamax [Topiramate]     Says it makes her  "constipation and she can't urinate normally"   Uribel [Meth-Hyo-M Bl-Na Phos-Ph Sal] Nausea Only   Uribel [Urelle] Swelling and Other (See Comments)    Patient stated this makes her stomach swell and makes her  feel si   Lamotrigine Rash   Sulfamethoxazole-Trimethoprim Rash    Patient developed feelings of dizziness, rash and itchy hands.  Patient then vomited and had diarrhea.    Assessment/Plan:  1. Hyperlipidemia -  No problems updated. No problem-specific Assessment & Plan notes found for this encounter.    Thank you,  Carmela Hurt, Pharm.D [facility] A Division of [facility] [address], [place], [place] [address]  Phone: [phone]; Fax: [phone]

## 2023-05-01 NOTE — Patient Instructions (Addendum)
Your Results:             Your most recent labs Goal  Total Cholesterol 272 < 200  Triglycerides 514 < 150  HDL (happy/good cholesterol) 60 > 40  LDL (lousy/bad cholesterol 121 < 70   Medication changes: Start taking Tricor 145 mg daily daily if tolerated well in 2 weeks start pravastatin 20 mg daily   Praluent is a cholesterol medication that improved your body's ability to get rid of "bad cholesterol" known as LDL. It can lower your LDL up to 60%. It is an injection that is given under the skin every 2 weeks. The most common side effects of Praluent include runny nose, symptoms of the common cold, rarely flu or flu-like symptoms, back/muscle pain in about 3-4% of the patients, and redness, pain, or bruising at the injection site.    Repatha is a cholesterol medication that improved your body's ability to get rid of "bad cholesterol" known as LDL. It can lower your LDL up to 60%! It is an injection that is given under the skin every 2 weeks. the most common side effects of Repatha include runny nose, symptoms of the common cold, rarely flu or flu-like symptoms, back/muscle pain in about 3-4% of the patients, and redness, pain, or bruising at the injection site.   Lab orders: We want to repeat labs after 2-3 months.  We will send you a lab order to remind you once we get closer to that time.

## 2023-05-01 NOTE — Assessment & Plan Note (Addendum)
Assessment:  LDL goal: < 70 mg/dl  and TG < 366 mg/dl last LDLc 440 mg/dl TG 347 mg/dl Currently not on any medications  Intolerance to moderate intensity statins - Crestor- myalgia and jaw pain Due to chronic upset stomach can't follow high fiber diet Due to weak lower legs can not walk far  EtOH 3-4 drinks per week Reviewed LDLc and TG lowering options mainly fibrates and statins. Discussed importance of lifestyle modification including cutting down on EtOh to lower TG   Plan: Start taking Tricor 145 mg daily daily if tolerated well in 2 weeks start pravastatin 20 mg daily Will repeat LFT in 4 weeks and FLP in 12 weeks  Patient to cut down on alcohol intake and cut down on smoking   Follow up in 4 weeks to assess tolerability  In future Can add ezetimibe or PCSK9i for LDLc lowering and Vascepa if TG is not at goal

## 2023-05-02 ENCOUNTER — Ambulatory Visit: Payer: BC Managed Care – PPO | Admitting: Physician Assistant

## 2023-05-02 ENCOUNTER — Encounter: Payer: Self-pay | Admitting: Physician Assistant

## 2023-05-02 ENCOUNTER — Other Ambulatory Visit: Payer: BC Managed Care – PPO

## 2023-05-02 VITALS — BP 120/80 | HR 80 | Ht 63.0 in | Wt 164.0 lb

## 2023-05-02 DIAGNOSIS — J9611 Chronic respiratory failure with hypoxia: Secondary | ICD-10-CM

## 2023-05-02 DIAGNOSIS — R197 Diarrhea, unspecified: Secondary | ICD-10-CM

## 2023-05-02 DIAGNOSIS — K219 Gastro-esophageal reflux disease without esophagitis: Secondary | ICD-10-CM

## 2023-05-02 DIAGNOSIS — Z86010 Personal history of colonic polyps: Secondary | ICD-10-CM | POA: Diagnosis not present

## 2023-05-02 DIAGNOSIS — R1010 Upper abdominal pain, unspecified: Secondary | ICD-10-CM

## 2023-05-02 DIAGNOSIS — F1029 Alcohol dependence with unspecified alcohol-induced disorder: Secondary | ICD-10-CM

## 2023-05-02 MED ORDER — NA SULFATE-K SULFATE-MG SULF 17.5-3.13-1.6 GM/177ML PO SOLN: 1.0000 | Freq: Once | ORAL | 0 refills | Status: AC

## 2023-05-02 NOTE — Patient Instructions (Addendum)
Your provider has requested that you go to the basement level for lab work before leaving today. Press "B" on the elevator. The lab is located at the first door on the left as you exit the elevator.  You have been scheduled for a colonoscopy. Please follow written instructions given to you at your visit today.   Please pick up your prep supplies at the pharmacy within the next 1-3 days.  If you use inhalers (even only as needed), please bring them with you on the day of your procedure.  DO NOT TAKE 7 DAYS PRIOR TO TEST- Trulicity (dulaglutide) Ozempic, Wegovy (semaglutide) Mounjaro (tirzepatide) Bydureon Bcise (exanatide extended release)  DO NOT TAKE 1 DAY PRIOR TO YOUR TEST Rybelsus (semaglutide) Adlyxin (lixisenatide) Victoza (liraglutide) Byetta (exanatide) ___________________________________________________________________________   Bonita Quin have been scheduled for a pre visit for your procedure on 07/21/2023 at 2:30pm- this is a telephone visit.   Consider switching benicar to losartan or valsartan to help with diarrhea Add on fiber like benefiber once a day Stop drinking alcohol, ask your primary care for help.   Suggest getting sleep study  Avoid spicy and acidic foods Avoid fatty foods Limit your intake of coffee, tea, alcohol, and carbonated drinks Work to maintain a healthy weight Keep the head of the bed elevated at least 3 inches with blocks or a wedge pillow if you are having any nighttime symptoms Stay upright for 2 hours after eating Avoid meals and snacks three to four hours before bedtime Stop smoking  First do a trial off milk/lactose products if you use them.  Add fiber like benefiber or citracel once a day Increase activity Can do trial of IBGard which is over the counter for AB pain- Take 1-2 capsules once a day for maintence or twice a day during a flare Please try to decrease stress. consider talking with PCP about anti anxiety medication or try head space  app for meditation. if any worsening symptoms like blood in stool, weight loss, please call the office     FODMAP stands for fermentable oligo-, di-, mono-saccharides and polyols (1). These are the scientific terms used to classify groups of carbs that are difficult for our body to digest and that are notorious for triggering digestive symptoms like bloating, gas, loose stools and stomach pain.   You can try low FODMAP diet  - start with eliminating just one column at a time that you feel may be a trigger for you. - the table at the very bottom contains foods that are low in FODMAPs   Sometimes trying to eliminate the FODMAP's from your diet is difficult or tricky, if you are stuggling with trying to do the elimination diet you can try an enzyme.  There is a food enzymes that you sprinkle in or on your food that helps break down the FODMAP. You can read more about the enzyme by going to this site: https://fodzyme.com/   _______________________________________________________  If your blood pressure at your visit was 140/90 or greater, please contact your primary care physician to follow up on this.  _______________________________________________________  If you are age 61 or older, your body mass index should be between 23-30. Your Body mass index is 29.05 kg/m. If this is out of the aforementioned range listed, please consider follow up with your Primary Care Provider.  If you are age 61 or younger, your body mass index should be between 19-25. Your Body mass index is 29.05 kg/m. If this is out of the aformentioned  range listed, please consider follow up with your Primary Care Provider.   ________________________________________________________  The [facility] providers would like to encourage you to use [facility] to communicate with providers for non-urgent requests or questions.  Due to long hold times on the telephone, sending your provider a message by [facility] may be a faster and  more efficient way to get a response.  Please allow 48 business hours for a response.  Please remember that this is for non-urgent requests.  _______________________________________________________ It was a pleasure to see you today!  Thank you for trusting me with your gastrointestinal care!

## 2023-05-05 NOTE — Progress Notes (Signed)
Agree with the assessment and plan as outlined by Quentin Mulling,  PA-C.  If C diff test negative would recommend treating IBS-D with rifaximin.  Would avoid Viberzi given alcohol issues.  Recent liver enzymes normal, but would get GGT which is more sensitive for alcohol-related liver injury.  Rashaun Wichert E. Tomasa Rand, MD

## 2023-05-07 ENCOUNTER — Other Ambulatory Visit: Payer: BC Managed Care – PPO

## 2023-05-07 DIAGNOSIS — R197 Diarrhea, unspecified: Secondary | ICD-10-CM | POA: Diagnosis not present

## 2023-05-09 ENCOUNTER — Telehealth: Payer: Self-pay | Admitting: Physician Assistant

## 2023-05-09 DIAGNOSIS — R197 Diarrhea, unspecified: Secondary | ICD-10-CM

## 2023-05-09 DIAGNOSIS — K219 Gastro-esophageal reflux disease without esophagitis: Secondary | ICD-10-CM

## 2023-05-09 LAB — C. DIFFICILE GDH AND TOXIN A/B
GDH ANTIGEN: DETECTED
MICRO NUMBER:: 15212072
SPECIMEN QUALITY:: ADEQUATE
TOXIN A AND B: NOT DETECTED

## 2023-05-09 LAB — CLOSTRIDIUM DIFFICILE TOXIN B, QUALITATIVE, REAL-TIME PCR: Toxigenic C. Difficile by PCR: NOT DETECTED

## 2023-05-09 NOTE — Telephone Encounter (Signed)
PT is having bright green stools. Requesting a call back

## 2023-05-12 ENCOUNTER — Other Ambulatory Visit: Payer: Self-pay | Admitting: *Deleted

## 2023-05-12 MED ORDER — RIFAXIMIN 550 MG PO TABS: 550.0000 mg | ORAL_TABLET | Freq: Three times a day (TID) | ORAL | 0 refills | Status: AC

## 2023-05-12 NOTE — Telephone Encounter (Signed)
Called patient to notify of Quentin Mulling PA's, info and recommendations in reference to patients complaints. Informed of the primary care follow up as well as informing of the ABX treatment for diarrhea. Patient states she already has an appt with her primary at the end of August. Informed the patient to also stop drinking and the importance of not drinking while taking medications including the ABX medication. Patient was also in formed to go to the ED if any of the following symptoms pursued: severe weakness, chest pain, severe abdominal pain, blood in stool. Patient understood and agreed.

## 2023-05-12 NOTE — Telephone Encounter (Signed)
Called patient due to complaints of having light green colored stool for 3 days, although states the stool is no longer green in color, but does have abdominal pain which was worse on yesterday. Patient also states she has had diarrhea for the last year and is still consistent with having these stools. Asked if patient considered Imodium OTC, patient states she has taken Imodium and it helps but she cannot continue to take everyday and wants to know what to do at this point. C-diff is negative and patient is concerned due to the continence of the diarrhea. Patient is scheduled for egd/colon on 08/07/23. Please advise.

## 2023-05-12 NOTE — Telephone Encounter (Signed)
Green-colored stool can be from foods that we eat, but is not an alarm symptom, she had negative C. difficile. Patient scheduled for colonoscopy in October, has to be done in the hospital due to oxygen. If the Imodium is helping she is able to use the Imodium daily until her appointment in October. Can offer trial of Xifaxan 550 TID for 14 days for possible small intestinal bacterial overgrowth. Please remind patient that alcohol increases diarrhea, advised her to follow-up with primary care and stop drinking. Remind patient go to the ER for expedited work up/ colonoscopy if she has severe weakness, shortness of breath, chest pain, severe abdominal pain, blood in stool etc.

## 2023-05-13 MED ORDER — COLESTIPOL HCL 1 G PO TABS: 2.0000 g | ORAL_TABLET | Freq: Two times a day (BID) | ORAL | 0 refills | Status: DC | PRN

## 2023-05-13 MED ORDER — PANTOPRAZOLE SODIUM 40 MG PO TBEC: 40.0000 mg | DELAYED_RELEASE_TABLET | Freq: Two times a day (BID) | ORAL | 1 refills | Status: DC

## 2023-05-13 NOTE — Addendum Note (Signed)
Addended by: Quentin Mulling on: 05/13/2023 12:40 PM   Modules accepted: Orders

## 2023-05-13 NOTE — Telephone Encounter (Signed)
Inbound call from patient stating Xifaxan medication is not covered by insurance. Requesting a call back to discuss further. Also would like medication sent in for GERD. Please advise, thank you.

## 2023-05-13 NOTE — Telephone Encounter (Signed)
Patient called and informed of Quentin Mulling, PA's recommendations for diarrhea. Also informed the patient of the increase of Protonix for the GERD symptoms, patient states she does not have the Protonix and did not received when ordered on 03/2022. Patient was informed the medication would be ordered and as Ms. Steffanie Dunn, PA ordered the increased Protonix medication. Patient seemed agreeable.

## 2023-05-13 NOTE — Addendum Note (Signed)
Addended by: Michiel Sites on: 05/13/2023 01:26 PM   Modules accepted: Orders

## 2023-05-13 NOTE — Telephone Encounter (Signed)
Can try to increase protonix to twice a day but that will only help the GERd, not the diarrhea.  Increasing fiber, taking imodium as needed.  We can do a trial of colestipol pills, take as needed for diarrhea.

## 2023-05-15 ENCOUNTER — Other Ambulatory Visit: Payer: Self-pay | Admitting: Physician Assistant

## 2023-05-16 ENCOUNTER — Telehealth: Payer: Self-pay | Admitting: Physician Assistant

## 2023-05-16 ENCOUNTER — Telehealth: Payer: Self-pay | Admitting: Adult Health

## 2023-05-16 ENCOUNTER — Telehealth: Payer: Self-pay | Admitting: Pharmacy Technician

## 2023-05-16 DIAGNOSIS — G4733 Obstructive sleep apnea (adult) (pediatric): Secondary | ICD-10-CM

## 2023-05-16 NOTE — Telephone Encounter (Signed)
Pharmacy Patient Advocate Encounter   Received notification from CoverMyMeds that prior authorization for XIFAXAN 550MG  is required/requested.   Insurance verification completed.   The patient is insured through [facility] .   Per test claim: PA required; PA started via CoverMyMeds. KEY [id number] . Waiting for clinical questions to populate.

## 2023-05-16 NOTE — Telephone Encounter (Signed)
PT still having severe diarrhea and sees mucous in stool. She wants to know if there are other tests that can be ran to test for bacterial infection besides c.diff to be able to get an antibiotic. Insurance wont approve medication without showing actual infection

## 2023-05-16 NOTE — Telephone Encounter (Addendum)
Patient states her cardiologist and GI wants her to have another sleep study in lab NOT home sleep study. Patient thinks her home sleep study was interrupted. Please advise and call patient back.

## 2023-05-19 ENCOUNTER — Other Ambulatory Visit (HOSPITAL_COMMUNITY): Payer: Self-pay

## 2023-05-19 ENCOUNTER — Telehealth: Payer: Self-pay

## 2023-05-19 NOTE — Telephone Encounter (Signed)
*  Gastro  Pharmacy Patient Advocate Encounter   Received notification from CoverMyMeds that prior authorization for Pantoprazole Sodium 40MG  dr tablets  is required/requested.   Insurance verification completed.   The patient is insured through [facility] .   Per test claim: PA required; PA submitted to [employer] via CoverMyMeds Key/confirmation #/EOC [id number] Status is pending

## 2023-05-19 NOTE — Telephone Encounter (Signed)
Patient had negative Cdiff, no need to evaluate for GI stool pathogen.  With description after colestipol, do not take again but suggest getting KUB to evaluate stools burden and continue with colonoscopy with Dr. Tomasa Rand at the hospital.  Any worsening symptoms, go to the ER to expedite work up.

## 2023-05-19 NOTE — Telephone Encounter (Signed)
Clinical questions were submitted, but we have not yet received a response from insurance with a determination. The pharmacy prior auth team will update once we receive an answer.

## 2023-05-19 NOTE — Telephone Encounter (Signed)
Inbound call from patient requesting a call back regarding previous message. States last night she had lower right abdominal pain and constipation. Please advise, thank you.

## 2023-05-19 NOTE — Telephone Encounter (Signed)
Pt states she took 1 colestid tab last night and now states she has abd discomfort like she needs to pass gas or have a BM but she can't. She is also having pain on her right side. Reports she has mucous in her stool. She wanted to know if she needed another test to see if she has bacteria in her stool so she could get xifaxan. Discussed with her that this antbx works differently and is given for IBS-D and her insurance did not approve it. She wanted to know if we could make them approve the med, let her know we cannot make an insurance company do anything. Let her know we will check on the status of the approval and send note to Quentin Mulling PA for possible recommendations.

## 2023-05-20 ENCOUNTER — Other Ambulatory Visit: Payer: Self-pay

## 2023-05-20 DIAGNOSIS — R1010 Upper abdominal pain, unspecified: Secondary | ICD-10-CM

## 2023-05-20 NOTE — Telephone Encounter (Signed)
Pharmacy Patient Advocate Encounter   Received notification from CoverMyMeds that prior authorization for PANTOPRAZOLE 40MG  is required/requested.   Insurance verification completed.   The patient is insured through [facility] .   Per test claim: PA required; PA submitted to [employer] via CoverMyMeds Key/confirmation #/EOC [id number] Status is pending   Questions have been answered.

## 2023-05-20 NOTE — Telephone Encounter (Signed)
Spoke with pt and she is aware of recommendations per Quentin Mulling PA. Order in epic for KUB and pt knows to come for xray.

## 2023-05-23 ENCOUNTER — Ambulatory Visit: Admission: RE | Admit: 2023-05-23 | Payer: BC Managed Care – PPO | Source: Ambulatory Visit

## 2023-05-23 ENCOUNTER — Other Ambulatory Visit (HOSPITAL_COMMUNITY): Payer: Self-pay

## 2023-05-23 ENCOUNTER — Telehealth: Payer: Self-pay | Admitting: Pharmacy Technician

## 2023-05-23 DIAGNOSIS — J449 Chronic obstructive pulmonary disease, unspecified: Secondary | ICD-10-CM | POA: Diagnosis not present

## 2023-05-23 DIAGNOSIS — R1010 Upper abdominal pain, unspecified: Secondary | ICD-10-CM

## 2023-05-23 DIAGNOSIS — G4736 Sleep related hypoventilation in conditions classified elsewhere: Secondary | ICD-10-CM | POA: Diagnosis not present

## 2023-05-23 DIAGNOSIS — R109 Unspecified abdominal pain: Secondary | ICD-10-CM | POA: Diagnosis not present

## 2023-05-23 NOTE — Telephone Encounter (Signed)
Pharmacy Patient Advocate Encounter  Received notification from [facility] that Prior Authorization for XIFAXAN 550 MG has been DENIED. Please advise how you'd like to proceed. Full denial letter will be uploaded to the media tab. See denial reason below.  PA #/Case ID/Reference #: [id number]

## 2023-05-23 NOTE — Telephone Encounter (Signed)
Pharmacy Patient Advocate Encounter  Received notification from [facility] that Prior Authorization for PANTOPRAZOLE 40 MG has been APPROVED from 05/20/2023 to 05/19/2024. Ran test claim, Copay is $0.00  PA #/Case ID/Reference #: [id number]

## 2023-05-26 ENCOUNTER — Telehealth: Payer: Self-pay | Admitting: Physician Assistant

## 2023-05-26 NOTE — Telephone Encounter (Signed)
Patient wants to know if Marchelle Folks thinks she should pay out of pocket for the xifaxan if it would help her diarrhea or is there is an alternative. The med is 2,000.  She need letter to get out of jury duty. Juror [phone], date for jury duty 06/14/23 at 8:15am.  States she had bad diarrhea needing frequent bathroom breaks. Also having general anxiety, panic attacks, and is on oxygen.  Let pt know we will call her when letter is ready.

## 2023-05-26 NOTE — Telephone Encounter (Signed)
Inbound call from patient stating she has been in severe pain and having green watery stools. Patient also stated she discussed that she has jury duty starting on 8/21 and would like a note since she is having constant diarrhea and pain. Patient is also requesting a call back regarding recent imaging results. Please advise, thank you.

## 2023-05-26 NOTE — Telephone Encounter (Signed)
Reason for denial:

## 2023-05-27 ENCOUNTER — Encounter: Payer: Self-pay | Admitting: Physician Assistant

## 2023-05-27 ENCOUNTER — Ambulatory Visit: Payer: BC Managed Care – PPO | Attending: Internal Medicine | Admitting: Pharmacist

## 2023-05-27 ENCOUNTER — Encounter: Payer: Self-pay | Admitting: Pharmacist

## 2023-05-27 DIAGNOSIS — H8109 Meniere's disease, unspecified ear: Secondary | ICD-10-CM | POA: Insufficient documentation

## 2023-05-27 DIAGNOSIS — I25118 Atherosclerotic heart disease of native coronary artery with other forms of angina pectoris: Secondary | ICD-10-CM | POA: Insufficient documentation

## 2023-05-27 DIAGNOSIS — E7849 Other hyperlipidemia: Secondary | ICD-10-CM | POA: Diagnosis not present

## 2023-05-27 DIAGNOSIS — I2510 Atherosclerotic heart disease of native coronary artery without angina pectoris: Secondary | ICD-10-CM | POA: Diagnosis not present

## 2023-05-27 DIAGNOSIS — E785 Hyperlipidemia, unspecified: Secondary | ICD-10-CM | POA: Diagnosis not present

## 2023-05-27 MED ORDER — METRONIDAZOLE 250 MG PO TABS: 250.0000 mg | ORAL_TABLET | Freq: Three times a day (TID) | ORAL | 0 refills | Status: AC

## 2023-05-27 MED ORDER — FENOFIBRATE 145 MG PO TABS
145.0000 mg | ORAL_TABLET | Freq: Every day | ORAL | 2 refills | Status: DC
Start: 2023-05-27 — End: 2023-07-27

## 2023-05-27 NOTE — Telephone Encounter (Signed)
Spoke with pt and she will come to the office to pick up the letter. Discussed with her the options of flagyl and not drinking alcohol and the [place] pharmacy for xifaxan. Pt would like to try the flagyl. Please advise what dose you would like sent in for her.

## 2023-05-27 NOTE — Telephone Encounter (Signed)
Doree Albee, PA-C  You8 minutes ago (10:13 AM)   [facility] Sent in flagyl for her with directions on it about drinking and a warning. We will see how this goes. Sent to [facility], [place]

## 2023-05-27 NOTE — Patient Instructions (Signed)
It was nice meeting you today  We would like your LDL (bad cholesterol) to be less than 70 and your triglycerides to be less than 150  It is likely your triglycerides are elevated due to alcohol so I recommend decreasing the amount you are drinking every week  Continue pravastatin 20mg  daily at night and fenofibrate 145mg  daily  Laural Golden, PharmD, BCACP, CDCES, CPP [address] [place], [place], [address] Phone: [phone], Fax: [phone]

## 2023-05-27 NOTE — Telephone Encounter (Signed)
Yes.  Can place order for in lab sleep study

## 2023-05-27 NOTE — Telephone Encounter (Signed)
Letter sent and printed for [employer].  We could try a different antibiotic but with her Cdiff history I would not want to do that, we could do flagyl however if she drank any alcohol she would have significant interactions like flushing and vomiting. So we could try that with the understanding she could not drink or we can try a [place] mail order pharmacy.  Thanks, Marchelle Folks

## 2023-05-27 NOTE — Progress Notes (Signed)
Patient ID: Sarah Reeves                 DOB: [birth year]                    MRN: [id number]     HPI: Sarah Reeves is a 62 y.o. female patient referred to lipid clinic by Sarah Reeves. PMH is significant for CAD, angina, HTN, COPD, smoking, alcohol abuse, and anxiety.  CT on 3/29 showed non-calcified plaque in multiple arteries. Calcium score zero. CAD-RADS 3. Moderate stenosis.  Patient smokes 1/2 ppd and drinks 5 vodka drinks per day. Does not want to reduce alcohol consumption because she is worried she will have a seizure.  Prescribed metronidazole yesterday by GI but she has not started because she continues to drink.  At last visit with PharmD, patient was prescribed fenofibrate and pravastatin. She did not start the pravastatin and did not pick up the fenofibrate. Appears to have ezetimibe at home but she is also not taking.  Declines referral for counseling/therapy.  Also has a prescription for Colestipol from GI at home but she stopped taking due to GI side effects.  Current Medications:  Pravastatin 20mg   (not taking) Fenofibrate 145mg  (not taking) Ezetimibe 10mg  (not taking) Colestipol 1g BID (not taking)  Risk Factors: CAD HLD Smoking Alcohol abuse   LDL goal: <70  Labs: TC 272, Trigs 514, HDL 60, LDL 121 (01/24/23)  Past Medical History:  Diagnosis Date   Angio-edema    Anxiety    Arthritis    Asthma    Clostridioides difficile infection    Complication of anesthesia    takes alot to sedated   COPD (chronic obstructive pulmonary disease) (HCC)    Depression    Fibromyalgia    GERD (gastroesophageal reflux disease)    Hepatitis C    (Dr. Kinnie Scales) Treated with Harvoni March-May 2016   Herpes simplex    Hyperlipemia    Insomnia    Mental disorder    Neuropathy    PONV (postoperative nausea and vomiting)    Sleep apnea    Urticaria     Current Outpatient Medications on File Prior to Visit  Medication Sig Dispense Refill   Ascorbic Acid (VITAMIN C)  1000 MG tablet Take 1,000 mg by mouth daily.     aspirin EC 81 MG tablet Take 1 tablet (81 mg total) by mouth daily. 90 tablet 3   Budeson-Glycopyrrol-Formoterol (BREZTRI AEROSPHERE) 160-9-4.8 MCG/ACT AERO Inhale 2 puffs into the lungs in the morning and at bedtime. Alternates between symbicort and Breztri 10.7 g 6   cetirizine (ZYRTEC) 10 MG tablet Take 1 tablet (10 mg total) by mouth daily. 30 tablet 5   chlorpheniramine-HYDROcodone (TUSSIONEX) 10-8 MG/5ML SUER      cholecalciferol (VITAMIN D) 1000 UNITS tablet Take 2,000 Units by mouth daily.      clotrimazole (MYCELEX) 10 MG troche Take 1 tablet (10 mg total) by mouth 5 (five) times daily. 70 tablet 0   colestipol (COLESTID) 1 g tablet Take 2 tablets (2 g total) by mouth 2 (two) times daily as needed (diarrhea). 60 tablet 0   cyclobenzaprine (FLEXERIL) 10 MG tablet Take 10 mg by mouth 3 (three) times daily as needed for muscle spasms.     Dextromethorphan-guaiFENesin (TUSSIN DM) 10-100 MG/5ML liquid Take 5 mLs by mouth every 12 (twelve) hours. 200 mL 0   diazepam (VALIUM) 5 MG tablet Take 1 tablet (5 mg total) by mouth 2 (two) times  daily. 10 tablet 0   estradiol (VIVELLE-DOT) 0.075 MG/24HR Place onto the skin.     fenofibrate (TRICOR) 145 MG tablet Take 1 tablet (145 mg total) by mouth daily. 90 tablet 3   gabapentin (NEURONTIN) 300 MG capsule Take 300 mg by mouth daily as needed (nerve pain).     levalbuterol (XOPENEX HFA) 45 MCG/ACT inhaler INHALE 2 PUFFS INTO THE LUNGS EVERY 4 HOURS AS NEEDED FOR WHEEZING 15 g 1   levalbuterol (XOPENEX) 1.25 MG/3ML nebulizer solution Take 1.25 mg by nebulization every 4 (four) hours as needed for wheezing. 72 mL 5   LORazepam (ATIVAN) 2 MG tablet Take 2 mg by mouth in the morning, at noon, and at bedtime.     meclizine (ANTIVERT) 25 MG tablet Take 25 mg by mouth 3 (three) times daily as needed for dizziness.     metoprolol tartrate (LOPRESSOR) 100 MG tablet Take 1 tab 2 hour prior to procedure 1 tablet 0    montelukast (SINGULAIR) 10 MG tablet Take 1 tablet (10 mg total) by mouth at bedtime. 30 tablet 5   nitroGLYCERIN (NITROSTAT) 0.4 MG SL tablet Place 1 tablet (0.4 mg total) under the tongue every 5 (five) minutes as needed for chest pain. 25 tablet 2   nystatin (MYCOSTATIN) 100000 UNIT/ML suspension Take 5 mL swish and swallow by mouth 4 times a day for 5 days 60 mL 0   olmesartan (BENICAR) 20 MG tablet Take 1 tablet (20 mg total) by mouth daily. 90 tablet 3   pantoprazole (PROTONIX) 40 MG tablet TAKE 1 TABLET(40 MG) BY MOUTH TWICE DAILY BEFORE A MEAL 180 tablet 1   potassium chloride (KLOR-CON) 10 MEQ tablet Take 1 tablet (10 mEq total) by mouth daily. 30 tablet 3   pravastatin (PRAVACHOL) 20 MG tablet Take 1 tablet (20 mg total) by mouth every evening. 90 tablet 3   progesterone (PROMETRIUM) 100 MG capsule Take 200 mg by mouth at bedtime.     valACYclovir (VALTREX) 500 MG tablet Take 500 mg by mouth daily as needed (outbreaks).     Vitamin D, Cholecalciferol, 25 MCG (1000 UT) TABS Take 25 mcg by mouth daily. 60 tablet 3   zolpidem (AMBIEN) 10 MG tablet Take 10 mg by mouth at bedtime.     Current Facility-Administered Medications on File Prior to Visit  Medication Dose Route Frequency Provider Last Rate Last Admin   chlorhexidine (HIBICLENS) 4 % liquid 1 application  1 application  Topical Once Chevis Pretty III, MD       chlorhexidine (HIBICLENS) 4 % liquid 1 application  1 application  Topical Once Chevis Pretty III, MD        Allergies  Allergen Reactions   Keflex [Cephalexin] Hives, Itching and Other (See Comments)    Tongue and throat itching/tingling   Shellfish Allergy Other (See Comments)    Unknown reaction; "MD told me I was allergic"   Vancomycin Anaphylaxis    Face and tongue swells   Zoloft [Sertraline Hcl] Other (See Comments)    REACTION: Nightmares, Grind teeth really bad   Latex Itching    Itch and blisters from contact   Amitriptyline     Hot, itching, tongue tingling,  breakthrough bleeding.   Clindamycin/Lincomycin Hives and Other (See Comments)    Stated that this causes her to get really horse and made her feel really hot   Other Other (See Comments)   Penicillins    Pentosan Polysulfate Sodium Swelling    Throat swelling  Rosuvastatin Other (See Comments)   Shellfish-Derived Products Other (See Comments)   Sulfa Antibiotics Other (See Comments)   Topamax [Topiramate]     Says it makes her "constipation and she can't urinate normally"   Uribel [Meth-Hyo-M Bl-Na Phos-Ph Sal] Nausea Only   Uribel [Urelle] Swelling and Other (See Comments)    Patient stated this makes her stomach swell and makes her feel si   Lamotrigine Rash   Sulfamethoxazole-Trimethoprim Rash    Patient developed feelings of dizziness, rash and itchy hands.  Patient then vomited and had diarrhea.    Assessment/Plan:  1. Hyperlipidemia -  Patient's last LDL 121 which is above goal of <70 however patient has not been taking any lipid lowering medications due to fear of side effects or interactions with alcohol. Recommended decreasing alcohol consumption and behavioral health appt however patient declines. Difficult to ascertain how much alcohol she is drinking (at least 5 drinks per day) so advised to reduce quantity by 1 per day each week until cessation. Patient voiced understanding.  Since patient has not started her new medications yet recommend starting pravastatin and fenofibrate as prescribed at last visit. Patient voiced understanding.  Continue pravastatin 20mg  daily Continue fenofibrate 145mg  daily  Laural Golden, PharmD, BCACP, CDCES, CPP [address], [address] [place], [place], [address] Phone: [phone], Fax: [phone]

## 2023-05-28 DIAGNOSIS — Z1330 Encounter for screening examination for mental health and behavioral disorders, unspecified: Secondary | ICD-10-CM | POA: Diagnosis not present

## 2023-05-28 DIAGNOSIS — R7989 Other specified abnormal findings of blood chemistry: Secondary | ICD-10-CM | POA: Diagnosis not present

## 2023-05-28 DIAGNOSIS — R946 Abnormal results of thyroid function studies: Secondary | ICD-10-CM | POA: Diagnosis not present

## 2023-05-28 DIAGNOSIS — F1010 Alcohol abuse, uncomplicated: Secondary | ICD-10-CM | POA: Diagnosis not present

## 2023-05-28 NOTE — Telephone Encounter (Signed)
Spoke with patient regarding in lab sleep study has been ordered and patient's voice was understanding. Nothing else further needed.

## 2023-06-02 ENCOUNTER — Telehealth: Payer: Self-pay | Admitting: Pulmonary Disease

## 2023-06-02 NOTE — Telephone Encounter (Signed)
error 

## 2023-06-02 NOTE — Telephone Encounter (Signed)
[employer] denied sleep study authorization  Clinical Rationale: Your doctor ordered a test to adjust treatment for a condition that causes short periods of not breathing while asleep (sleep apnea). This test is needed when your prior sleep test showed a certain number of pauses in breathing. We reviewed the notes we have. The notes do not show that your prior sleep test showed this. Based on the information we have, this test is not medically necessary. We used [employer] Medical Benefits Management Clinical Guideline titled Sleep Disorder Management, Polysomnography and Home Sleep Apnea Testing to make this decision.   Appeal process has been started, faxed further clinical information to [phone] Turn around time for a decision is 06/06/23

## 2023-06-03 ENCOUNTER — Ambulatory Visit: Payer: BC Managed Care – PPO | Admitting: Cardiology

## 2023-06-11 DIAGNOSIS — R7989 Other specified abnormal findings of blood chemistry: Secondary | ICD-10-CM | POA: Diagnosis not present

## 2023-06-11 NOTE — Telephone Encounter (Signed)
Per chart there has not been a resolution on appeal for sleep study. Sending message to Bebe Liter for follow up.  Patient can use Tender Grip cannula fixation pads. These are very inexpensive from [employer], may be able to find at local pharmacy also.   ATC patient x2 - no answer, no vm, just rings

## 2023-06-11 NOTE — Telephone Encounter (Signed)
Pt called in to state her bf has to wake her up in the middle of the night bc she stops breathing also her oxygen tubing comes out of her nose while sleeping

## 2023-06-17 ENCOUNTER — Telehealth: Payer: Self-pay | Admitting: Physician Assistant

## 2023-06-17 NOTE — Telephone Encounter (Signed)
Pt wanted to let us know she did not take the flagyl as she was afraid of the possible side effects. She also reports she is not taking her protonix as her reflux is better.

## 2023-06-17 NOTE — Telephone Encounter (Signed)
PT wants to speak to nurse about medications. She hasn't start antibiotics or stomach meds for indigestion because of side effects. Please advise.

## 2023-06-18 ENCOUNTER — Telehealth: Payer: Self-pay | Admitting: Physician Assistant

## 2023-06-18 NOTE — Telephone Encounter (Signed)
Please see note below and advise if you want to see pt regarding her liver issues prior to her colonoscopy.

## 2023-06-18 NOTE — Telephone Encounter (Signed)
Inbound call from patient calling wanting to know why her appointment was canceled for 9/6. I advised patient that it looks like the appointment was canceled because she was able to get a sooner appointment for 7/12. Patient stated she did not understand why the appointment was canceled if she didn't cancel it. She stated she needed to be seen to discuss her fatty liver. Patient is requesting a call to discuss if Dr. Tomasa Rand wants to see her to discuss her liver issues before her colon in October or after. Please advise.

## 2023-06-19 NOTE — Telephone Encounter (Signed)
Sarah Lucks, MD  Sarah Nose, RN Caller: Unspecified (Yesterday,  8:49 AM) I have no idea why the patient's appointment was canceled (I am assuming she is talking about the appointment for her EGD and colonoscopy?).  There is no reason that I would need to see her in the office regarding her liver before her endoscopic procedures.  She should be scheduled for her EGD and colonoscopy as originally planned.  It will be more helpful to see her again after we have the information from these procedures.        Spoke with pt and she is aware of Dr. Milas Hock recommendations and will proceed with the procedures as scheduled.

## 2023-06-20 DIAGNOSIS — Z760 Encounter for issue of repeat prescription: Secondary | ICD-10-CM | POA: Diagnosis not present

## 2023-06-20 DIAGNOSIS — R29898 Other symptoms and signs involving the musculoskeletal system: Secondary | ICD-10-CM | POA: Diagnosis not present

## 2023-06-20 DIAGNOSIS — N760 Acute vaginitis: Secondary | ICD-10-CM | POA: Diagnosis not present

## 2023-06-20 DIAGNOSIS — F1020 Alcohol dependence, uncomplicated: Secondary | ICD-10-CM | POA: Diagnosis not present

## 2023-06-23 DIAGNOSIS — J449 Chronic obstructive pulmonary disease, unspecified: Secondary | ICD-10-CM | POA: Diagnosis not present

## 2023-06-23 DIAGNOSIS — G4736 Sleep related hypoventilation in conditions classified elsewhere: Secondary | ICD-10-CM | POA: Diagnosis not present

## 2023-06-27 ENCOUNTER — Ambulatory Visit: Payer: BC Managed Care – PPO | Admitting: Physician Assistant

## 2023-06-30 ENCOUNTER — Telehealth: Payer: Self-pay | Admitting: Cardiology

## 2023-06-30 DIAGNOSIS — N760 Acute vaginitis: Secondary | ICD-10-CM | POA: Diagnosis not present

## 2023-06-30 DIAGNOSIS — E876 Hypokalemia: Secondary | ICD-10-CM

## 2023-06-30 DIAGNOSIS — I10 Essential (primary) hypertension: Secondary | ICD-10-CM

## 2023-06-30 DIAGNOSIS — R109 Unspecified abdominal pain: Secondary | ICD-10-CM | POA: Diagnosis not present

## 2023-06-30 NOTE — Telephone Encounter (Signed)
Returned call to pt regarding some side effects she is having. Pt has been on Klor Con for a few months now. She did have C-DIFF before and now she is having diarrhea and stomach pain. She did not take it for a couples days and she had a normal "poopy". She would like to know what to do. She did not take it this morning because she had another appt and did not want diarrhea and her bm was normal. Please advise.

## 2023-06-30 NOTE — Telephone Encounter (Signed)
Pt c/o medication issue:  1. Name of Medication: potassium chloride (KLOR-CON) 10 MEQ tablet   2. How are you currently taking this medication (dosage and times per day)?    3. Are you having a reaction (difficulty breathing--STAT)? no  4. What is your medication issue? Patient is calling here diarrhea  and stomach issues. Calling to see if there is something else she can take. Please advise

## 2023-07-03 NOTE — Telephone Encounter (Signed)
Advised of information and she states understanding. She states she took the potassium today but will not tomorrow.  She will come next Friday for labs

## 2023-07-03 NOTE — Telephone Encounter (Signed)
Will forward  to  pharmacy team for [facility] for suggestions

## 2023-07-03 NOTE — Telephone Encounter (Signed)
Would have her hold potassium and have her come for BMP next week

## 2023-07-09 ENCOUNTER — Telehealth: Payer: Self-pay | Admitting: Cardiology

## 2023-07-09 ENCOUNTER — Ambulatory Visit: Payer: BC Managed Care – PPO | Admitting: Nurse Practitioner

## 2023-07-09 DIAGNOSIS — N898 Other specified noninflammatory disorders of vagina: Secondary | ICD-10-CM | POA: Diagnosis not present

## 2023-07-09 DIAGNOSIS — Z131 Encounter for screening for diabetes mellitus: Secondary | ICD-10-CM | POA: Diagnosis not present

## 2023-07-09 DIAGNOSIS — R109 Unspecified abdominal pain: Secondary | ICD-10-CM | POA: Diagnosis not present

## 2023-07-09 NOTE — Telephone Encounter (Signed)
Advised to continue to hold potassium and come for Labs on Friday as instructed

## 2023-07-09 NOTE — Telephone Encounter (Signed)
Patient said that she had to start retaking her magnesium to keep her electrolytes up because she is taking an antibiotic by the name of Nitrofurantoin mono-mac 100 mg for 7 days. Please give call back

## 2023-07-09 NOTE — Telephone Encounter (Signed)
Patient taking antibiotic for UTI.  Started yesterday, this morning will be her 3rd dose. She is referring to potassium not magnesium.  She states starting potassium back yesterday because the instructions stated to keep electrolytes up and so she started it back for that reason.  She has taken two doses.  Advised to stop the potasium and to increase fluids drinking, water, Gatorade, and Pedialyte.  She was to hold the potassium due to loose stools (previous message) and have Labs done Friday.  She seems incredibly confused about her meds and what she is taking.  She C/O pain to her stomach and looser stools and she is calling her GYN.  Advised that she stated prior messages that loose stools and this is reason the potassium was held, and this could be the reason.  Also advised the stomach pain she is describing sounds related to UTI.  Advised her to call the physician treating the UTI for these issues.  Please advise if she should continue to hold the potassium and if she should have her lab done on Friday as earlier directed.

## 2023-07-09 NOTE — Progress Notes (Deleted)
Office Visit    Patient Name: Sarah Reeves Date of Encounter: 07/09/2023  Primary Care Provider:  Marcelle Overlie, MD Primary Cardiologist:  Bryan Lemma, MD  Chief Complaint    62 year old female with a history of nonobstructive CAD, chronic chest pain, hypertension, COPD, tobacco use, hepatitis, and chronic anxiety who presents for follow-up related to CAD and hyperlipidemia.  Past Medical History    Past Medical History:  Diagnosis Date   Angio-edema    Anxiety    Arthritis    Asthma    Clostridioides difficile infection    Complication of anesthesia    takes alot to sedated   COPD (chronic obstructive pulmonary disease) (HCC)    Depression    Fibromyalgia    GERD (gastroesophageal reflux disease)    Hepatitis C    (Dr. Kinnie Scales) Treated with Harvoni March-May 2016   Herpes simplex    Hyperlipemia    Insomnia    Mental disorder    Neuropathy    PONV (postoperative nausea and vomiting)    Sleep apnea    Urticaria    Past Surgical History:  Procedure Laterality Date   AUGMENTATION MAMMAPLASTY Bilateral    BREAST BIOPSY Left 11/2014   BREAST BIOPSY Left 12/2014   BREAST ENHANCEMENT SURGERY     BREAST LUMPECTOMY WITH RADIOACTIVE SEED LOCALIZATION Left 12/26/2014   Procedure: LEFT BREAST LUMPECTOMY WITH RADIOACTIVE SEED LOCALIZATION;  Surgeon: Chevis Pretty III, MD;  Location: [facility];  Service: General;  Laterality: Left;   BREAST REDUCTION SURGERY     COLONOSCOPY     EXPLORATORY LAPAROTOMY     NM MYOVIEW LTD  07/07/2015   Normal Myocardial Perfusion Scan. Low risk lexiscan nuclear study with minimal insignificant breast attenuation and normal myocardial perfusion and function; EF 53% without wall motion abnormalities and normal systolic thickening   TEMPOROMANDIBULAR JOINT SURGERY     TUBAL LIGATION     UPPER GI ENDOSCOPY     x5    Allergies  Allergies  Allergen Reactions   Keflex [Cephalexin] Hives, Itching and Other (See Comments)     Tongue and throat itching/tingling   Shellfish Allergy Other (See Comments)    Unknown reaction; "MD told me I was allergic"   Vancomycin Anaphylaxis    Face and tongue swells   Zoloft [Sertraline Hcl] Other (See Comments)    REACTION: Nightmares, Grind teeth really bad   Latex Itching    Itch and blisters from contact   Amitriptyline     Hot, itching, tongue tingling, breakthrough bleeding.   Clindamycin/Lincomycin Hives and Other (See Comments)    Stated that this causes her to get really horse and made her feel really hot   Other Other (See Comments)   Penicillins    Pentosan Polysulfate Sodium Swelling    Throat swelling   Rosuvastatin Other (See Comments)   Shellfish-Derived Products Other (See Comments)   Sulfa Antibiotics Other (See Comments)   Topamax [Topiramate]     Says it makes her "constipation and she can't urinate normally"   Uribel [Meth-Hyo-M Bl-Na Phos-Ph Sal] Nausea Only   Uribel [Urelle] Swelling and Other (See Comments)    Patient stated this makes her stomach swell and makes her feel si   Lamotrigine Rash   Sulfamethoxazole-Trimethoprim Rash    Patient developed feelings of dizziness, rash and itchy hands.  Patient then vomited and had diarrhea.     Labs/Other Studies Reviewed    The following studies were reviewed today:  Cardiac  Studies & Procedures     STRESS TESTS  MYOCARDIAL PERFUSION IMAGING 07/07/2015  Narrative  Nuclear stress EF: 53%.  The left ventricular ejection fraction is mildly decreased (45-54%).  There was no ST segment deviation noted during stress.  Normal myocardial perfusion and function.  Low risk lexiscan nuclear study with minimal insignificant breast attenuation and normal myocardial perfusion and function; EF 53% without wall motion abnormalities and normal systolic thickening.   ECHOCARDIOGRAM  ECHOCARDIOGRAM COMPLETE 03/11/2023  Narrative ECHOCARDIOGRAM REPORT    Patient Name:   Sarah Reeves Date of Exam:  03/11/2023 Medical Rec #:  [id number]       Height:       64.0 in Accession #:    [id number]      Weight:       75.0 lb Date of Birth:  1961-01-10       BSA:          1.289 m Patient Age:    61 years        BP:           138/86 mmHg Patient Gender: F               HR:           85 bpm. Exam Location:  [address]  Procedure: 2D Echo, Cardiac Doppler, Color Doppler and Strain Analysis  Indications:    R06.00 Dyspnea  History:        Patient has prior history of Echocardiogram examinations. Signs/Symptoms:Dizziness/Lightheadedness; Risk Factors:Current Smoker.  Sonographer:    Clearence Ped RCS Referring Phys: [id number] TAMMY S PARRETT  IMPRESSIONS   1. Left ventricular ejection fraction, by estimation, is 60 to 65%. The left ventricle has normal function. The left ventricle has no regional wall motion abnormalities. Left ventricular diastolic parameters are consistent with Grade I diastolic dysfunction (impaired relaxation). The average left ventricular global longitudinal strain is -16.5 %. The global longitudinal strain is normal. 2. Right ventricular systolic function is normal. The right ventricular size is normal. 3. The mitral valve is normal in structure. No evidence of mitral valve regurgitation. No evidence of mitral stenosis. Moderate mitral annular calcification. 4. The aortic valve is tricuspid. Aortic valve regurgitation is not visualized. No aortic stenosis is present. 5. The inferior vena cava is normal in size with greater than 50% respiratory variability, suggesting right atrial pressure of 3 mmHg.  FINDINGS Left Ventricle: Left ventricular ejection fraction, by estimation, is 60 to 65%. The left ventricle has normal function. The left ventricle has no regional wall motion abnormalities. The average left ventricular global longitudinal strain is -16.5 %. The global longitudinal strain is normal. The left ventricular internal cavity size was normal in size. There is no left  ventricular hypertrophy. Left ventricular diastolic parameters are consistent with Grade I diastolic dysfunction (impaired relaxation).  Right Ventricle: The right ventricular size is normal. No increase in right ventricular wall thickness. Right ventricular systolic function is normal.  Left Atrium: Left atrial size was normal in size.  Right Atrium: Right atrial size was normal in size.  Pericardium: There is no evidence of pericardial effusion.  Mitral Valve: The mitral valve is normal in structure. Moderate mitral annular calcification. No evidence of mitral valve regurgitation. No evidence of mitral valve stenosis.  Tricuspid Valve: The tricuspid valve is normal in structure. Tricuspid valve regurgitation is not demonstrated. No evidence of tricuspid stenosis.  Aortic Valve: The aortic valve is tricuspid. Aortic valve regurgitation is not visualized. No aortic  stenosis is present.  Pulmonic Valve: The pulmonic valve was normal in structure. Pulmonic valve regurgitation is not visualized. No evidence of pulmonic stenosis.  Aorta: The aortic root is normal in size and structure.  Venous: The inferior vena cava is normal in size with greater than 50% respiratory variability, suggesting right atrial pressure of 3 mmHg.  IAS/Shunts: No atrial level shunt detected by color flow Doppler.   LEFT VENTRICLE PLAX 2D LVIDd:         4.10 cm   Diastology LVIDs:         2.70 cm   LV e' medial:    8.92 cm/s LV PW:         0.90 cm   LV E/e' medial:  9.0 LV IVS:        1.00 cm   LV e' lateral:   10.20 cm/s LVOT diam:     1.90 cm   LV E/e' lateral: 7.9 LV SV:         69 LV SV Index:   53        2D Longitudinal Strain LVOT Area:     2.84 cm  2D Strain GLS (A2C):   -16.4 % 2D Strain GLS (A3C):   -17.1 % 2D Strain GLS (A4C):   -16.0 % 2D Strain GLS Avg:     -16.5 %  RIGHT VENTRICLE RV Basal diam:  2.80 cm RV S prime:     9.36 cm/s TAPSE (M-mode): 1.9 cm  LEFT ATRIUM             Index         RIGHT ATRIUM           Index LA diam:        3.40 cm 2.64 cm/m   RA Area:     11.10 cm LA Vol (A2C):   29.0 ml 22.49 ml/m  RA Volume:   24.00 ml  18.61 ml/m LA Vol (A4C):   27.1 ml 21.02 ml/m LA Biplane Vol: 28.9 ml 22.41 ml/m AORTIC VALVE LVOT Vmax:   95.00 cm/s LVOT Vmean:  63.900 cm/s LVOT VTI:    0.243 m  AORTA Ao Root diam: 3.40 cm Ao Asc diam:  2.90 cm  MITRAL VALVE MV Area (PHT):             SHUNTS MV Decel Time:             Systemic VTI:  0.24 m MV E velocity: 80.70 cm/s  Systemic Diam: 1.90 cm MV A velocity: 95.40 cm/s MV E/A ratio:  0.85  Donato Schultz MD Electronically signed by Donato Schultz MD Signature Date/Time: 03/11/2023/11:48:53 AM    Final     CT SCANS  CT CORONARY MORPH W/CTA COR W/SCORE 01/17/2023  Addendum 01/19/2023 11:12 PM ADDENDUM REPORT: 01/19/2023 23:09  EXAM: OVER-READ INTERPRETATION  CT CHEST  The following report is an over-read performed by radiologist Dr. Arnoldo Morale [facility] on 01/19/2023. This over-read does not include interpretation of cardiac or coronary anatomy or pathology. The coronary CTA interpretation by the cardiologist is attached.  COMPARISON:  Chest CT 01/02/2023.  FINDINGS: Mild emphysema.  Bilateral breast implants.  IMPRESSION: No significant extracardiac findings.   Electronically Signed By: Elgie Collard M.D. On: 01/19/2023 23:09  Narrative CLINICAL DATA:  35F with chest pain  EXAM: Cardiac/Coronary CTA  TECHNIQUE: The patient was scanned on a [employer].  FINDINGS: A 100 kV prospective scan was triggered in the descending thoracic aorta  at 111 HU's. Axial non-contrast 3 mm slices were carried out through the heart. The data set was analyzed on a dedicated work station and scored using the Agatson method. Gantry rotation speed was 250 msecs and collimation was .6 mm. No beta blockade and 0.8 mg of sl NTG was given. The 3D data set was reconstructed in  5% intervals of the 35-75% of the R-R cycle. Phases were analyzed on a dedicated work station using MPR, MIP and VRT modes. The patient received 100 cc of contrast.  Coronary Arteries:  Normal coronary origin.  Right dominance.  RCA is a large dominant artery that gives rise to PDA and PLA. Noncalcified plaque in proximal RCA causes 0-24% stenosis  Left main is a large artery that gives rise to LAD and LCX arteries.  LAD is a large vessel. Noncalcified plaque in proximal LAD causes 0-24% stenosis. Noncalcified plaque in ostium of large D1 branch causes 50-69% stenosis  LCX is a non-dominant artery that gives rise to one large OM1 branch. There is no plaque.  Other findings:  Left Ventricle: Normal size  Left Atrium: Normal size  Pulmonary Veins: Normal configuration  Right Ventricle: Normal size  Right Atrium: Normal size  Cardiac valves: No calcifications  Thoracic aorta: Normal size  Pulmonary Arteries: Normal size  Systemic Veins: Normal drainage  Pericardium: Normal thickness  IMPRESSION: 1. Coronary calcium score of 0.  2. Total plaque volume 54 mm3 which is 52nd percentile for age- and sex-matched controls (calcified plaque 0 mm3; noncalcified plaque 54 mm3). Total plaque volume is mild  3. Normal coronary origin with right dominance.  4. Noncalcified plaque in ostium of large D1 branch causes moderate (50-69%) stenosis  5. Noncalcified plaque in proximal LAD causes minimal (0-24%) stenosis.  6. Noncalcified plaque in proximal RCA causes minimal (0-24%) stenosis  7. Will send study for CTFFR  CAD-RADS 3. Moderate stenosis. Consider symptom-guided anti-ischemic pharmacotherapy as well as risk factor modification per guideline directed care. Additional analysis with CT FFR will be submitted.  Electronically Signed: By: Epifanio Lesches M.D. On: 01/17/2023 16:19         Recent Labs: 11/08/2022: TSH 2.047 02/28/2023: BUN 13; Creatinine, Ser  0.87; Hemoglobin 13.0; Platelets 214; Potassium 3.9; Sodium 133 05/27/2023: ALT 33  Recent Lipid Panel    Component Value Date/Time   CHOL 272 (H) 01/24/2023 0843   TRIG 514 (H) 01/24/2023 0843   HDL 60 01/24/2023 0843   CHOLHDL 4.5 (H) 01/24/2023 0843   CHOLHDL 3.3 06/27/2018 0623   VLDL 23 06/27/2018 0623   LDLCALC 121 (H) 01/24/2023 0843    History of Present Illness    62 year old female with the above past medical history including nonobstructive CAD, chronic chest pain, hypertension, COPD, tobacco use, hepatitis, and chronic anxiety.  She has a history of chronic chest pain. Lexiscan in 2016 was negative for ischemia. CT of the chest for lung cancer screening in 12/2022 revealed evidence of aortic atherosclerosis, coronary calcifications.  At her follow-up visit in March 2024 she noted intermittent chest discomfort, stable chronic dyspnea in the setting of COPD (follows with pulmonology).  Hydrochlorothiazide was discontinued in setting of chronic hypokalemia.  She was started on olmesartan 20 mg daily.  Coronary CT angiogram revealed nonobstructive CAD.  Echocardiogram in 02/2023 revealed EF 60 to 65%, normal LV function, no RWMA, G1 DD, normal RV systolic function, no significant valvular abnormalities.  She was last seen in the office on 04/04/2023 and was stable from a cardiac standpoint.  She was referred to the clinic Pharm.D. in the setting of statin intolerance.  She was started on pravastatin and fenofibrate.  She presents today for follow-up accompanied by her friend. Since her last visit she has been   1. CAD/chest pain: She has a longstanding history of intermittent chest discomfort.  Myoview in 2016 was low risk.  CT of the chest for lung cancer screening that showed evidence of aortic atherosclerosis, possible coronary calcifications.  CCTA in 12/2022 revealed nonobstructive CAD.  Echo in 02/2023 was essentially normal.  She has not tolerated statin therapy.  Continue aspirin,  olmesartan.   2. Hypertension: BP well controlled, borderline low in office today but has been stable otherwise.  She is slightly tachycardic in office today, denies any palpitations, dizziness.  Continue to monitor BP/HR.  For now, continue current antihypertensive regimen.    3. Hyperlipidemia: LDL was 121 in 01/2023.  She did not tolerate statin therapy.  Will refer to lipid clinic Pharm.D. for consideration of alternative lipid-lowering therapy.  4. COPD/tobacco use/OSA: She continues to smoke.  Now on home O2 at night. She notes a history of OSA, no longer on CPAP.  She has noted that her oxygen saturation decreases at nighttime.  Recommend follow-up with pulmonology.  Smoking cessation advised.   5. Anxiety: Ongoing, stable.  Managed per PCP.   6. Disposition: Follow-up scheduled with Dr. Herbie Baltimore in 05/2023.  Home Medications    Current Outpatient Medications  Medication Sig Dispense Refill   Ascorbic Acid (VITAMIN C) 1000 MG tablet Take 1,000 mg by mouth daily.     aspirin EC 81 MG tablet Take 1 tablet (81 mg total) by mouth daily. 90 tablet 3   Budeson-Glycopyrrol-Formoterol (BREZTRI AEROSPHERE) 160-9-4.8 MCG/ACT AERO Inhale 2 puffs into the lungs in the morning and at bedtime. Alternates between symbicort and Breztri 10.7 g 6   cetirizine (ZYRTEC) 10 MG tablet Take 1 tablet (10 mg total) by mouth daily. 30 tablet 5   chlorpheniramine-HYDROcodone (TUSSIONEX) 10-8 MG/5ML SUER      cholecalciferol (VITAMIN D) 1000 UNITS tablet Take 2,000 Units by mouth daily.      ciclopirox (PENLAC) 8 % solution Apply topically.     clotrimazole (MYCELEX) 10 MG troche Take 1 tablet (10 mg total) by mouth 5 (five) times daily. 70 tablet 0   colestipol (COLESTID) 1 g tablet Take 2 tablets (2 g total) by mouth 2 (two) times daily as needed (diarrhea). 60 tablet 0   cyclobenzaprine (FLEXERIL) 10 MG tablet Take 10 mg by mouth 3 (three) times daily as needed for muscle spasms.      Dextromethorphan-guaiFENesin (TUSSIN DM) 10-100 MG/5ML liquid Take 5 mLs by mouth every 12 (twelve) hours. 200 mL 0   diazepam (VALIUM) 5 MG tablet Take 1 tablet (5 mg total) by mouth 2 (two) times daily. 10 tablet 0   DULoxetine (CYMBALTA) 30 MG capsule Take 30 mg by mouth daily.     EPINEPHrine (AUVI-Q) 0.1 MG/0.1ML SOAJ Inject into the muscle.     estradiol (VIVELLE-DOT) 0.075 MG/24HR Place onto the skin.     ezetimibe (ZETIA) 10 MG tablet Take 10 mg by mouth daily.     fenofibrate (TRICOR) 145 MG tablet Take 1 tablet (145 mg total) by mouth daily. 30 tablet 2   gabapentin (NEURONTIN) 300 MG capsule Take 300 mg by mouth daily as needed (nerve pain).     hydrochlorothiazide (HYDRODIURIL) 50 MG tablet Take 50 mg by mouth daily.     levalbuterol [facility]  HFA) 45 MCG/ACT inhaler INHALE 2 PUFFS INTO THE LUNGS EVERY 4 HOURS AS NEEDED FOR WHEEZING 15 g 1   levalbuterol (XOPENEX) 1.25 MG/3ML nebulizer solution Take 1.25 mg by nebulization every 4 (four) hours as needed for wheezing. 72 mL 5   LORazepam (ATIVAN) 2 MG tablet Take 2 mg by mouth in the morning, at noon, and at bedtime.     meclizine (ANTIVERT) 25 MG tablet Take 25 mg by mouth 3 (three) times daily as needed for dizziness.     metoprolol tartrate (LOPRESSOR) 100 MG tablet Take 1 tab 2 hour prior to procedure 1 tablet 0   montelukast (SINGULAIR) 10 MG tablet Take 1 tablet (10 mg total) by mouth at bedtime. 30 tablet 5   Na Sulfate-K Sulfate-Mg Sulf 17.5-3.13-1.6 GM/177ML SOLN Take 1 kit by mouth once.     nitroGLYCERIN (NITROSTAT) 0.4 MG SL tablet Place 1 tablet (0.4 mg total) under the tongue every 5 (five) minutes as needed for chest pain. 25 tablet 2   nystatin (MYCOSTATIN) 100000 UNIT/ML suspension Take 5 mL swish and swallow by mouth 4 times a day for 5 days 60 mL 0   olmesartan (BENICAR) 20 MG tablet Take 1 tablet (20 mg total) by mouth daily. 90 tablet 3   pantoprazole (PROTONIX) 40 MG tablet TAKE 1 TABLET(40 MG) BY MOUTH TWICE DAILY  BEFORE A MEAL 180 tablet 1   potassium chloride (KLOR-CON) 10 MEQ tablet Take 1 tablet (10 mEq total) by mouth daily. 30 tablet 3   pravastatin (PRAVACHOL) 20 MG tablet Take 1 tablet (20 mg total) by mouth every evening. 90 tablet 3   progesterone (PROMETRIUM) 100 MG capsule Take 200 mg by mouth at bedtime.     promethazine (PHENERGAN) 25 MG tablet Take 25 mg by mouth.     SUMAtriptan (IMITREX) 50 MG tablet Take 50 mg by mouth every 6 (six) hours as needed.     terbinafine (LAMISIL) 250 MG tablet Take 1 tablet by mouth daily.     valACYclovir (VALTREX) 500 MG tablet Take 500 mg by mouth daily as needed (outbreaks).     Vitamin D, Cholecalciferol, 25 MCG (1000 UT) TABS Take 25 mcg by mouth daily. 60 tablet 3   zolpidem (AMBIEN) 10 MG tablet Take 10 mg by mouth at bedtime.     No current facility-administered medications for this visit.   Facility-Administered Medications Ordered in Other Visits  Medication Dose Route Frequency Provider Last Rate Last Admin   chlorhexidine (HIBICLENS) 4 % liquid 1 application  1 application  Topical Once Chevis Pretty III, MD       chlorhexidine (HIBICLENS) 4 % liquid 1 application  1 application  Topical Once Chevis Pretty III, MD         Review of Systems    She denies chest pain, palpitations, dyspnea, pnd, orthopnea, n, v, dizziness, syncope, edema, weight gain, or early satiety. All other systems reviewed and are otherwise negative except as noted above.   Physical Exam    VS:  There were no vitals taken for this visit.  GEN: Well nourished, well developed, in no acute distress. HEENT: normal. Neck: Supple, no JVD, carotid bruits, or masses. Cardiac: RRR, no murmurs, rubs, or gallops. No clubbing, cyanosis, edema.  Radials/DP/PT 2+ and equal bilaterally.  Respiratory:  Respirations regular and unlabored, clear to auscultation bilaterally. GI: Soft, nontender, nondistended, BS + x 4. MS: no deformity or atrophy. Skin: warm and dry, no rash. Neuro:   Strength and sensation  are intact. Psych: Normal affect.  Accessory Clinical Findings    ECG personally reviewed by me today -  Lab Results  Component Value Date   WBC 9.6 02/28/2023   HGB 13.0 02/28/2023   HCT 38.5 02/28/2023   MCV 94.8 02/28/2023   PLT 214 02/28/2023   Lab Results  Component Value Date   CREATININE 0.87 02/28/2023   BUN 13 02/28/2023   NA 133 (L) 02/28/2023   K 3.9 02/28/2023   CL 100 02/28/2023   CO2 22 02/28/2023   Lab Results  Component Value Date   ALT 33 (H) 05/27/2023   AST 31 05/27/2023   ALKPHOS 72 05/27/2023   BILITOT 0.2 05/27/2023   Lab Results  Component Value Date   CHOL 272 (H) 01/24/2023   HDL 60 01/24/2023   LDLCALC 121 (H) 01/24/2023   TRIG 514 (H) 01/24/2023   CHOLHDL 4.5 (H) 01/24/2023    Lab Results  Component Value Date   HGBA1C 6.0 (H) 03/10/2021    Assessment & Plan     No BP recorded.  {Refresh Note OR Click here to enter BP  :1}***   Joylene Grapes, NP 07/09/2023, 5:53 AM

## 2023-07-09 NOTE — Telephone Encounter (Signed)
She should stop the potassium. I agree she needs to call her PCP or GYN about her issues and these are not cardiac related. She was on a very small dose of K.  She can come in for labs on Friday.

## 2023-07-11 ENCOUNTER — Other Ambulatory Visit: Payer: Self-pay

## 2023-07-11 DIAGNOSIS — E876 Hypokalemia: Secondary | ICD-10-CM

## 2023-07-11 DIAGNOSIS — I10 Essential (primary) hypertension: Secondary | ICD-10-CM | POA: Diagnosis not present

## 2023-07-11 DIAGNOSIS — T502X5A Adverse effect of carbonic-anhydrase inhibitors, benzothiadiazides and other diuretics, initial encounter: Secondary | ICD-10-CM | POA: Diagnosis not present

## 2023-07-12 LAB — BASIC METABOLIC PANEL
BUN/Creatinine Ratio: 11 — ABNORMAL LOW (ref 12–28)
BUN: 8 mg/dL (ref 8–27)
CO2: 22 mmol/L (ref 20–29)
Calcium: 9.7 mg/dL (ref 8.7–10.3)
Chloride: 96 mmol/L (ref 96–106)
Creatinine, Ser: 0.71 mg/dL (ref 0.57–1.00)
Glucose: 95 mg/dL (ref 70–99)
Potassium: 4.6 mmol/L (ref 3.5–5.2)
Sodium: 134 mmol/L (ref 134–144)
eGFR: 97 mL/min/{1.73_m2} (ref >59)

## 2023-07-12 LAB — SPECIMEN STATUS REPORT

## 2023-07-14 DIAGNOSIS — N390 Urinary tract infection, site not specified: Secondary | ICD-10-CM | POA: Diagnosis not present

## 2023-07-14 DIAGNOSIS — N898 Other specified noninflammatory disorders of vagina: Secondary | ICD-10-CM | POA: Diagnosis not present

## 2023-07-14 DIAGNOSIS — N760 Acute vaginitis: Secondary | ICD-10-CM | POA: Diagnosis not present

## 2023-07-18 ENCOUNTER — Ambulatory Visit: Payer: BC Managed Care – PPO | Admitting: Nurse Practitioner

## 2023-07-18 NOTE — Progress Notes (Deleted)
Office Visit    Patient Name: Sarah Reeves Date of Encounter: 07/18/2023  Primary Care Provider:  Marcelle Overlie, MD Primary Cardiologist:  Bryan Lemma, MD  Chief Complaint    62 year old female with a history of nonobstructive CAD, chronic chest pain, hypertension, COPD, tobacco use, hepatitis, ETOH dependence, and chronic anxiety who presents for follow-up related to CAD and hyperlipidemia.  Past Medical History    Past Medical History:  Diagnosis Date   Angio-edema    Anxiety    Arthritis    Asthma    Clostridioides difficile infection    Complication of anesthesia    takes alot to sedated   COPD (chronic obstructive pulmonary disease) (HCC)    Depression    Fibromyalgia    GERD (gastroesophageal reflux disease)    Hepatitis C    (Dr. Kinnie Scales) Treated with Harvoni March-May 2016   Herpes simplex    Hyperlipemia    Insomnia    Mental disorder    Neuropathy    PONV (postoperative nausea and vomiting)    Sleep apnea    Urticaria    Past Surgical History:  Procedure Laterality Date   AUGMENTATION MAMMAPLASTY Bilateral    BREAST BIOPSY Left 11/2014   BREAST BIOPSY Left 12/2014   BREAST ENHANCEMENT SURGERY     BREAST LUMPECTOMY WITH RADIOACTIVE SEED LOCALIZATION Left 12/26/2014   Procedure: LEFT BREAST LUMPECTOMY WITH RADIOACTIVE SEED LOCALIZATION;  Surgeon: Chevis Pretty III, MD;  Location: [facility];  Service: General;  Laterality: Left;   BREAST REDUCTION SURGERY     COLONOSCOPY     EXPLORATORY LAPAROTOMY     NM MYOVIEW LTD  07/07/2015   Normal Myocardial Perfusion Scan. Low risk lexiscan nuclear study with minimal insignificant breast attenuation and normal myocardial perfusion and function; EF 53% without wall motion abnormalities and normal systolic thickening   TEMPOROMANDIBULAR JOINT SURGERY     TUBAL LIGATION     UPPER GI ENDOSCOPY     x5    Allergies  Allergies  Allergen Reactions   Keflex [Cephalexin] Hives, Itching and Other  (See Comments)    Tongue and throat itching/tingling   Shellfish Allergy Other (See Comments)    Unknown reaction; "MD told me I was allergic"   Vancomycin Anaphylaxis    Face and tongue swells   Zoloft [Sertraline Hcl] Other (See Comments)    REACTION: Nightmares, Grind teeth really bad   Latex Itching    Itch and blisters from contact   Amitriptyline     Hot, itching, tongue tingling, breakthrough bleeding.   Clindamycin/Lincomycin Hives and Other (See Comments)    Stated that this causes her to get really horse and made her feel really hot   Other Other (See Comments)   Penicillins    Pentosan Polysulfate Sodium Swelling    Throat swelling   Rosuvastatin Other (See Comments)   Shellfish-Derived Products Other (See Comments)   Sulfa Antibiotics Other (See Comments)   Topamax [Topiramate]     Says it makes her "constipation and she can't urinate normally"   Uribel [Meth-Hyo-M Bl-Na Phos-Ph Sal] Nausea Only   Uribel [Urelle] Swelling and Other (See Comments)    Patient stated this makes her stomach swell and makes her feel si   Lamotrigine Rash   Sulfamethoxazole-Trimethoprim Rash    Patient developed feelings of dizziness, rash and itchy hands.  Patient then vomited and had diarrhea.     Labs/Other Studies Reviewed    The following studies were reviewed today:  Cardiac Studies & Procedures     STRESS TESTS  MYOCARDIAL PERFUSION IMAGING 07/07/2015  Narrative  Nuclear stress EF: 53%.  The left ventricular ejection fraction is mildly decreased (45-54%).  There was no ST segment deviation noted during stress.  Normal myocardial perfusion and function.  Low risk lexiscan nuclear study with minimal insignificant breast attenuation and normal myocardial perfusion and function; EF 53% without wall motion abnormalities and normal systolic thickening.   ECHOCARDIOGRAM  ECHOCARDIOGRAM COMPLETE 03/11/2023  Narrative ECHOCARDIOGRAM REPORT    Patient Name:   Sarah Reeves Date of Exam: 03/11/2023 Medical Rec #:  [id number]       Height:       64.0 in Accession #:    [id number]      Weight:       75.0 lb Date of Birth:  August 10, 1961       BSA:          1.289 m Patient Age:    61 years        BP:           138/86 mmHg Patient Gender: F               HR:           85 bpm. Exam Location:  [address]  Procedure: 2D Echo, Cardiac Doppler, Color Doppler and Strain Analysis  Indications:    R06.00 Dyspnea  History:        Patient has prior history of Echocardiogram examinations. Signs/Symptoms:Dizziness/Lightheadedness; Risk Factors:Current Smoker.  Sonographer:    Clearence Ped RCS Referring Phys: [phone] TAMMY S PARRETT  IMPRESSIONS   1. Left ventricular ejection fraction, by estimation, is 60 to 65%. The left ventricle has normal function. The left ventricle has no regional wall motion abnormalities. Left ventricular diastolic parameters are consistent with Grade I diastolic dysfunction (impaired relaxation). The average left ventricular global longitudinal strain is -16.5 %. The global longitudinal strain is normal. 2. Right ventricular systolic function is normal. The right ventricular size is normal. 3. The mitral valve is normal in structure. No evidence of mitral valve regurgitation. No evidence of mitral stenosis. Moderate mitral annular calcification. 4. The aortic valve is tricuspid. Aortic valve regurgitation is not visualized. No aortic stenosis is present. 5. The inferior vena cava is normal in size with greater than 50% respiratory variability, suggesting right atrial pressure of 3 mmHg.  FINDINGS Left Ventricle: Left ventricular ejection fraction, by estimation, is 60 to 65%. The left ventricle has normal function. The left ventricle has no regional wall motion abnormalities. The average left ventricular global longitudinal strain is -16.5 %. The global longitudinal strain is normal. The left ventricular internal cavity size was normal in  size. There is no left ventricular hypertrophy. Left ventricular diastolic parameters are consistent with Grade I diastolic dysfunction (impaired relaxation).  Right Ventricle: The right ventricular size is normal. No increase in right ventricular wall thickness. Right ventricular systolic function is normal.  Left Atrium: Left atrial size was normal in size.  Right Atrium: Right atrial size was normal in size.  Pericardium: There is no evidence of pericardial effusion.  Mitral Valve: The mitral valve is normal in structure. Moderate mitral annular calcification. No evidence of mitral valve regurgitation. No evidence of mitral valve stenosis.  Tricuspid Valve: The tricuspid valve is normal in structure. Tricuspid valve regurgitation is not demonstrated. No evidence of tricuspid stenosis.  Aortic Valve: The aortic valve is tricuspid. Aortic valve regurgitation is not visualized. No  aortic stenosis is present.  Pulmonic Valve: The pulmonic valve was normal in structure. Pulmonic valve regurgitation is not visualized. No evidence of pulmonic stenosis.  Aorta: The aortic root is normal in size and structure.  Venous: The inferior vena cava is normal in size with greater than 50% respiratory variability, suggesting right atrial pressure of 3 mmHg.  IAS/Shunts: No atrial level shunt detected by color flow Doppler.   LEFT VENTRICLE PLAX 2D LVIDd:         4.10 cm   Diastology LVIDs:         2.70 cm   LV e' medial:    8.92 cm/s LV PW:         0.90 cm   LV E/e' medial:  9.0 LV IVS:        1.00 cm   LV e' lateral:   10.20 cm/s LVOT diam:     1.90 cm   LV E/e' lateral: 7.9 LV SV:         69 LV SV Index:   53        2D Longitudinal Strain LVOT Area:     2.84 cm  2D Strain GLS (A2C):   -16.4 % 2D Strain GLS (A3C):   -17.1 % 2D Strain GLS (A4C):   -16.0 % 2D Strain GLS Avg:     -16.5 %  RIGHT VENTRICLE RV Basal diam:  2.80 cm RV S prime:     9.36 cm/s TAPSE (M-mode): 1.9 cm  LEFT  ATRIUM             Index        RIGHT ATRIUM           Index LA diam:        3.40 cm 2.64 cm/m   RA Area:     11.10 cm LA Vol (A2C):   29.0 ml 22.49 ml/m  RA Volume:   24.00 ml  18.61 ml/m LA Vol (A4C):   27.1 ml 21.02 ml/m LA Biplane Vol: 28.9 ml 22.41 ml/m AORTIC VALVE LVOT Vmax:   95.00 cm/s LVOT Vmean:  63.900 cm/s LVOT VTI:    0.243 m  AORTA Ao Root diam: 3.40 cm Ao Asc diam:  2.90 cm  MITRAL VALVE MV Area (PHT):             SHUNTS MV Decel Time:             Systemic VTI:  0.24 m MV E velocity: 80.70 cm/s  Systemic Diam: 1.90 cm MV A velocity: 95.40 cm/s MV E/A ratio:  0.85  Donato Schultz MD Electronically signed by Donato Schultz MD Signature Date/Time: 03/11/2023/11:48:53 AM    Final     CT SCANS  CT CORONARY MORPH W/CTA COR W/SCORE 01/17/2023  Addendum 01/19/2023 11:12 PM ADDENDUM REPORT: 01/19/2023 23:09  EXAM: OVER-READ INTERPRETATION  CT CHEST  The following report is an over-read performed by radiologist Dr. Arnoldo Morale [facility] on 01/19/2023. This over-read does not include interpretation of cardiac or coronary anatomy or pathology. The coronary CTA interpretation by the cardiologist is attached.  COMPARISON:  Chest CT 01/02/2023.  FINDINGS: Mild emphysema.  Bilateral breast implants.  IMPRESSION: No significant extracardiac findings.   Electronically Signed By: Elgie Collard M.D. On: 01/19/2023 23:09  Narrative CLINICAL DATA:  40F with chest pain  EXAM: Cardiac/Coronary CTA  TECHNIQUE: The patient was scanned on a [employer].  FINDINGS: A 100 kV prospective scan was triggered in the descending thoracic  aorta at 111 HU's. Axial non-contrast 3 mm slices were carried out through the heart. The data set was analyzed on a dedicated work station and scored using the Agatson method. Gantry rotation speed was 250 msecs and collimation was .6 mm. No beta blockade and 0.8 mg of sl NTG was given. The 3D  data set was reconstructed in 5% intervals of the 35-75% of the R-R cycle. Phases were analyzed on a dedicated work station using MPR, MIP and VRT modes. The patient received 100 cc of contrast.  Coronary Arteries:  Normal coronary origin.  Right dominance.  RCA is a large dominant artery that gives rise to PDA and PLA. Noncalcified plaque in proximal RCA causes 0-24% stenosis  Left main is a large artery that gives rise to LAD and LCX arteries.  LAD is a large vessel. Noncalcified plaque in proximal LAD causes 0-24% stenosis. Noncalcified plaque in ostium of large D1 branch causes 50-69% stenosis  LCX is a non-dominant artery that gives rise to one large OM1 branch. There is no plaque.  Other findings:  Left Ventricle: Normal size  Left Atrium: Normal size  Pulmonary Veins: Normal configuration  Right Ventricle: Normal size  Right Atrium: Normal size  Cardiac valves: No calcifications  Thoracic aorta: Normal size  Pulmonary Arteries: Normal size  Systemic Veins: Normal drainage  Pericardium: Normal thickness  IMPRESSION: 1. Coronary calcium score of 0.  2. Total plaque volume 54 mm3 which is 52nd percentile for age- and sex-matched controls (calcified plaque 0 mm3; noncalcified plaque 54 mm3). Total plaque volume is mild  3. Normal coronary origin with right dominance.  4. Noncalcified plaque in ostium of large D1 branch causes moderate (50-69%) stenosis  5. Noncalcified plaque in proximal LAD causes minimal (0-24%) stenosis.  6. Noncalcified plaque in proximal RCA causes minimal (0-24%) stenosis  7. Will send study for CTFFR  CAD-RADS 3. Moderate stenosis. Consider symptom-guided anti-ischemic pharmacotherapy as well as risk factor modification per guideline directed care. Additional analysis with CT FFR will be submitted.  Electronically Signed: By: Epifanio Lesches M.D. On: 01/17/2023 16:19         Recent Labs: 11/08/2022: TSH  2.047 02/28/2023: Hemoglobin 13.0; Platelets 214 05/27/2023: ALT 33 07/11/2023: BUN 8; Creatinine, Ser 0.71; Potassium 4.6; Sodium 134  Recent Lipid Panel    Component Value Date/Time   CHOL 272 (H) 01/24/2023 0843   TRIG 514 (H) 01/24/2023 0843   HDL 60 01/24/2023 0843   CHOLHDL 4.5 (H) 01/24/2023 0843   CHOLHDL 3.3 06/27/2018 0623   VLDL 23 06/27/2018 0623   LDLCALC 121 (H) 01/24/2023 0843    History of Present Illness    63 year old female with the above past medical history including nonobstructive CAD, chronic chest pain, hypertension, COPD, tobacco use, hepatitis, ETOH dependence, and chronic anxiety.  She has a history of chronic chest pain. Lexiscan in 2016 was negative for ischemia. CT of the chest for lung cancer screening in 12/2022 revealed evidence of aortic atherosclerosis, coronary calcifications.  At her follow-up visit in March 2024 she noted intermittent chest discomfort, stable chronic dyspnea in the setting of COPD (follows with pulmonology).  Hydrochlorothiazide was discontinued in setting of chronic hypokalemia.  She was started on olmesartan 20 mg daily.  Coronary CT angiogram revealed nonobstructive CAD.  Echocardiogram in 02/2023 revealed EF 60 to 65%, normal LV function, no RWMA, G1 DD, normal RV systolic function, no significant valvular abnormalities.  She was last seen in the office on 04/04/2023 and was stable  from a cardiac standpoint.  She was referred to the clinic Pharm.D. in the setting of statin intolerance.  She was started on pravastatin and fenofibrate.  She presents today for follow-up accompanied by her friend. Since her last visit she has been   1. CAD/chest pain: She has a longstanding history of intermittent chest discomfort.  Myoview in 2016 was low risk.  CT of the chest for lung cancer screening that showed evidence of aortic atherosclerosis, possible coronary calcifications.  CCTA in 12/2022 revealed nonobstructive CAD.  Echo in 02/2023 was essentially  normal.  She has not tolerated statin therapy.  Continue aspirin, olmesartan.   2. Hypertension: BP well controlled, borderline low in office today but has been stable otherwise.  She is slightly tachycardic in office today, denies any palpitations, dizziness.  Continue to monitor BP/HR.  For now, continue current antihypertensive regimen.    3. Hyperlipidemia: LDL was 121 in 01/2023.  She did not tolerate statin therapy.  Will refer to lipid clinic Pharm.D. for consideration of alternative lipid-lowering therapy.  4. COPD/tobacco use/OSA: She continues to smoke.  Now on home O2 at night. She notes a history of OSA, no longer on CPAP.  She has noted that her oxygen saturation decreases at nighttime.  Recommend follow-up with pulmonology.  Smoking cessation advised.   5. ETOH abuse:   6. Anxiety: Ongoing, stable.  Managed per PCP.   7. Disposition: Follow-up scheduled with Dr. Herbie Baltimore in 05/2023.  Home Medications    Current Outpatient Medications  Medication Sig Dispense Refill   Ascorbic Acid (VITAMIN C) 1000 MG tablet Take 1,000 mg by mouth daily.     aspirin EC 81 MG tablet Take 1 tablet (81 mg total) by mouth daily. 90 tablet 3   Budeson-Glycopyrrol-Formoterol (BREZTRI AEROSPHERE) 160-9-4.8 MCG/ACT AERO Inhale 2 puffs into the lungs in the morning and at bedtime. Alternates between symbicort and Breztri 10.7 g 6   cetirizine (ZYRTEC) 10 MG tablet Take 1 tablet (10 mg total) by mouth daily. 30 tablet 5   chlorpheniramine-HYDROcodone (TUSSIONEX) 10-8 MG/5ML SUER      cholecalciferol (VITAMIN D) 1000 UNITS tablet Take 2,000 Units by mouth daily.      ciclopirox (PENLAC) 8 % solution Apply topically.     clotrimazole (MYCELEX) 10 MG troche Take 1 tablet (10 mg total) by mouth 5 (five) times daily. 70 tablet 0   colestipol (COLESTID) 1 g tablet Take 2 tablets (2 g total) by mouth 2 (two) times daily as needed (diarrhea). 60 tablet 0   cyclobenzaprine (FLEXERIL) 10 MG tablet Take 10 mg by  mouth 3 (three) times daily as needed for muscle spasms.     Dextromethorphan-guaiFENesin (TUSSIN DM) 10-100 MG/5ML liquid Take 5 mLs by mouth every 12 (twelve) hours. 200 mL 0   diazepam (VALIUM) 5 MG tablet Take 1 tablet (5 mg total) by mouth 2 (two) times daily. 10 tablet 0   DULoxetine (CYMBALTA) 30 MG capsule Take 30 mg by mouth daily.     EPINEPHrine (AUVI-Q) 0.1 MG/0.1ML SOAJ Inject into the muscle.     estradiol (VIVELLE-DOT) 0.075 MG/24HR Place onto the skin.     ezetimibe (ZETIA) 10 MG tablet Take 10 mg by mouth daily.     fenofibrate (TRICOR) 145 MG tablet Take 1 tablet (145 mg total) by mouth daily. 30 tablet 2   gabapentin (NEURONTIN) 300 MG capsule Take 300 mg by mouth daily as needed (nerve pain).     hydrochlorothiazide (HYDRODIURIL) 50 MG tablet Take 50  mg by mouth daily.     levalbuterol (XOPENEX HFA) 45 MCG/ACT inhaler INHALE 2 PUFFS INTO THE LUNGS EVERY 4 HOURS AS NEEDED FOR WHEEZING 15 g 1   levalbuterol (XOPENEX) 1.25 MG/3ML nebulizer solution Take 1.25 mg by nebulization every 4 (four) hours as needed for wheezing. 72 mL 5   LORazepam (ATIVAN) 2 MG tablet Take 2 mg by mouth in the morning, at noon, and at bedtime.     meclizine (ANTIVERT) 25 MG tablet Take 25 mg by mouth 3 (three) times daily as needed for dizziness.     metoprolol tartrate (LOPRESSOR) 100 MG tablet Take 1 tab 2 hour prior to procedure 1 tablet 0   montelukast (SINGULAIR) 10 MG tablet Take 1 tablet (10 mg total) by mouth at bedtime. 30 tablet 5   Na Sulfate-K Sulfate-Mg Sulf 17.5-3.13-1.6 GM/177ML SOLN Take 1 kit by mouth once.     nitroGLYCERIN (NITROSTAT) 0.4 MG SL tablet Place 1 tablet (0.4 mg total) under the tongue every 5 (five) minutes as needed for chest pain. 25 tablet 2   nystatin (MYCOSTATIN) 100000 UNIT/ML suspension Take 5 mL swish and swallow by mouth 4 times a day for 5 days 60 mL 0   olmesartan (BENICAR) 20 MG tablet Take 1 tablet (20 mg total) by mouth daily. 90 tablet 3   pantoprazole  (PROTONIX) 40 MG tablet TAKE 1 TABLET(40 MG) BY MOUTH TWICE DAILY BEFORE A MEAL 180 tablet 1   potassium chloride (KLOR-CON) 10 MEQ tablet Take 1 tablet (10 mEq total) by mouth daily. 30 tablet 3   pravastatin (PRAVACHOL) 20 MG tablet Take 1 tablet (20 mg total) by mouth every evening. 90 tablet 3   progesterone (PROMETRIUM) 100 MG capsule Take 200 mg by mouth at bedtime.     promethazine (PHENERGAN) 25 MG tablet Take 25 mg by mouth.     SUMAtriptan (IMITREX) 50 MG tablet Take 50 mg by mouth every 6 (six) hours as needed.     terbinafine (LAMISIL) 250 MG tablet Take 1 tablet by mouth daily.     valACYclovir (VALTREX) 500 MG tablet Take 500 mg by mouth daily as needed (outbreaks).     Vitamin D, Cholecalciferol, 25 MCG (1000 UT) TABS Take 25 mcg by mouth daily. 60 tablet 3   zolpidem (AMBIEN) 10 MG tablet Take 10 mg by mouth at bedtime.     No current facility-administered medications for this visit.   Facility-Administered Medications Ordered in Other Visits  Medication Dose Route Frequency Provider Last Rate Last Admin   chlorhexidine (HIBICLENS) 4 % liquid 1 application  1 application  Topical Once Chevis Pretty III, MD       chlorhexidine (HIBICLENS) 4 % liquid 1 application  1 application  Topical Once Chevis Pretty III, MD         Review of Systems    She denies chest pain, palpitations, dyspnea, pnd, orthopnea, n, v, dizziness, syncope, edema, weight gain, or early satiety. All other systems reviewed and are otherwise negative except as noted above.   Physical Exam    VS:  There were no vitals taken for this visit.  GEN: Well nourished, well developed, in no acute distress. HEENT: normal. Neck: Supple, no JVD, carotid bruits, or masses. Cardiac: RRR, no murmurs, rubs, or gallops. No clubbing, cyanosis, edema.  Radials/DP/PT 2+ and equal bilaterally.  Respiratory:  Respirations regular and unlabored, clear to auscultation bilaterally. GI: Soft, nontender, nondistended, BS + x 4. MS: no  deformity or atrophy. Skin:  warm and dry, no rash. Neuro:  Strength and sensation are intact. Psych: Normal affect.  Accessory Clinical Findings    ECG personally reviewed by me today -  Lab Results  Component Value Date   WBC 9.6 02/28/2023   HGB 13.0 02/28/2023   HCT 38.5 02/28/2023   MCV 94.8 02/28/2023   PLT 214 02/28/2023   Lab Results  Component Value Date   CREATININE 0.71 07/11/2023   BUN 8 07/11/2023   NA 134 07/11/2023   K 4.6 07/11/2023   CL 96 07/11/2023   CO2 22 07/11/2023   Lab Results  Component Value Date   ALT 33 (H) 05/27/2023   AST 31 05/27/2023   ALKPHOS 72 05/27/2023   BILITOT 0.2 05/27/2023   Lab Results  Component Value Date   CHOL 272 (H) 01/24/2023   HDL 60 01/24/2023   LDLCALC 121 (H) 01/24/2023   TRIG 514 (H) 01/24/2023   CHOLHDL 4.5 (H) 01/24/2023    Lab Results  Component Value Date   HGBA1C 6.0 (H) 03/10/2021    Assessment & Plan     No BP recorded.  {Refresh Note OR Click here to enter BP  :1}***   Joylene Grapes, NP 07/18/2023, 5:32 AM

## 2023-07-21 ENCOUNTER — Telehealth: Payer: Self-pay | Admitting: Physician Assistant

## 2023-07-21 ENCOUNTER — Other Ambulatory Visit: Payer: Self-pay

## 2023-07-21 ENCOUNTER — Ambulatory Visit (AMBULATORY_SURGERY_CENTER): Payer: BC Managed Care – PPO

## 2023-07-21 VITALS — Ht 63.0 in | Wt 153.0 lb

## 2023-07-21 DIAGNOSIS — R1010 Upper abdominal pain, unspecified: Secondary | ICD-10-CM

## 2023-07-21 DIAGNOSIS — R197 Diarrhea, unspecified: Secondary | ICD-10-CM

## 2023-07-21 DIAGNOSIS — Z860100 Personal history of colon polyps, unspecified: Secondary | ICD-10-CM

## 2023-07-21 NOTE — Progress Notes (Signed)
Denies allergies to eggs or soy products. Denies complication of anesthesia or sedation. Denies use of weight loss medication. Denies use of O2.   Emmi instructions given for colonoscopy.   Patient states that she already had Suprep. After I did the entire Pre-Visit the patient states that she was not going to follow through with the procedures because she isn't up to having it done. Patient states that she had a call into Dr. Milas Hock office because she wanted him to order another C-Diff test. Patient states she will follow up with the office to see if the test are ordered and to make sure that the procedures are cancelled.

## 2023-07-21 NOTE — Telephone Encounter (Signed)
Pt states she took an antibiotic for a bladder infection then she got a yeast infection. She has had watery diarrhea for the past several days. She think she may have c diff again. Also wants to cancel procedure, does not want to be sedated right now, reports she has a lot of stress right now and a sick family member. Please advise regarding cdiff test.

## 2023-07-21 NOTE — Telephone Encounter (Signed)
Inbound call from patient, states she has been on antibiotics due to infections and also is having diarrhea. Is worried she might have C- Diff again, is wanting to reschedule her procedure, would like to speak a nurse to further discuss.

## 2023-07-22 DIAGNOSIS — N760 Acute vaginitis: Secondary | ICD-10-CM | POA: Diagnosis not present

## 2023-07-22 DIAGNOSIS — R208 Other disturbances of skin sensation: Secondary | ICD-10-CM | POA: Diagnosis not present

## 2023-07-22 DIAGNOSIS — T2107XA Burn of unspecified degree of female genital region, initial encounter: Secondary | ICD-10-CM | POA: Diagnosis not present

## 2023-07-22 NOTE — Telephone Encounter (Signed)
Spoke with pt and she knows to come to lab for cdiff test. She is aware of other recommendations per Quentin Mulling PA.

## 2023-07-22 NOTE — Telephone Encounter (Signed)
Scheduled for colonoscopy endoscopy in the hospital 10/17, canceled. Put in order for patient to recheck C. difficile antigen, if positive will treat, if negative I think we need to reschedule EGD colonoscopy.  Please emphasize with patient stop drinking and follow-up with primary care.

## 2023-07-23 ENCOUNTER — Encounter: Payer: Self-pay | Admitting: Cardiology

## 2023-07-23 ENCOUNTER — Ambulatory Visit: Payer: BC Managed Care – PPO | Attending: Cardiology | Admitting: Cardiology

## 2023-07-23 VITALS — BP 102/64 | HR 105 | Ht 63.0 in | Wt 153.7 lb

## 2023-07-23 DIAGNOSIS — Z716 Tobacco abuse counseling: Secondary | ICD-10-CM

## 2023-07-23 DIAGNOSIS — I25118 Atherosclerotic heart disease of native coronary artery with other forms of angina pectoris: Secondary | ICD-10-CM

## 2023-07-23 DIAGNOSIS — M797 Fibromyalgia: Secondary | ICD-10-CM

## 2023-07-23 DIAGNOSIS — F411 Generalized anxiety disorder: Secondary | ICD-10-CM

## 2023-07-23 DIAGNOSIS — E782 Mixed hyperlipidemia: Secondary | ICD-10-CM

## 2023-07-23 DIAGNOSIS — T50905A Adverse effect of unspecified drugs, medicaments and biological substances, initial encounter: Secondary | ICD-10-CM

## 2023-07-23 DIAGNOSIS — G4736 Sleep related hypoventilation in conditions classified elsewhere: Secondary | ICD-10-CM | POA: Diagnosis not present

## 2023-07-23 DIAGNOSIS — Z720 Tobacco use: Secondary | ICD-10-CM

## 2023-07-23 DIAGNOSIS — F1029 Alcohol dependence with unspecified alcohol-induced disorder: Secondary | ICD-10-CM

## 2023-07-23 DIAGNOSIS — G4733 Obstructive sleep apnea (adult) (pediatric): Secondary | ICD-10-CM

## 2023-07-23 DIAGNOSIS — J449 Chronic obstructive pulmonary disease, unspecified: Secondary | ICD-10-CM | POA: Diagnosis not present

## 2023-07-23 DIAGNOSIS — I10 Essential (primary) hypertension: Secondary | ICD-10-CM | POA: Diagnosis not present

## 2023-07-23 DIAGNOSIS — R252 Cramp and spasm: Secondary | ICD-10-CM

## 2023-07-23 DIAGNOSIS — Z79899 Other long term (current) drug therapy: Secondary | ICD-10-CM

## 2023-07-23 DIAGNOSIS — E785 Hyperlipidemia, unspecified: Secondary | ICD-10-CM | POA: Diagnosis not present

## 2023-07-23 DIAGNOSIS — R079 Chest pain, unspecified: Secondary | ICD-10-CM

## 2023-07-23 DIAGNOSIS — F419 Anxiety disorder, unspecified: Secondary | ICD-10-CM

## 2023-07-23 MED ORDER — ROSUVASTATIN CALCIUM 10 MG PO TABS: 10.0000 mg | ORAL_TABLET | Freq: Every day | ORAL | Status: DC

## 2023-07-23 NOTE — Progress Notes (Signed)
Cardiology Office Note:  .   Date:  07/27/2023  ID:  Sarah Reeves, DOB 09-Aug-1961, MRN [id number] PCP: Marcelle Overlie, MD  [facility] Providers Cardiologist:  Bryan Lemma, MD     Chief Complaint  Patient presents with   Coronary Artery Disease    Minimal to mild nonobstructive disease by Coronary CTA. Multiple issues with lipid management.   Hypertension    Not sure what medicine she is taking.   Follow-up    83-month follow-up    Patient Profile: Sarah Reeves is a  62 y.o. female (recently widowed summer 2023) with a PMH notable for nonobstructive CAD, chronic chest pain, HLD (with statin intolerance-pravastatin, rosuvastatin and atorvastatin), hypertension, COPD, tobacco use, hepatitis, and chronic anxiety who presents for follow-up related to CAD and hyperlipidemia at the request of Marcelle Overlie, MD.  Sarah Reeves was last seen on 04/04/23 by Bernadene Person, NP-as a 45-month follow-up to discuss Coronary CTA results..  Notes stable dyspnea from COPD.  Noted that the HCTZ have been discontinued due to chronic hypokalemia and she was started on Benicar 20 mg daily.  Echo and Coronary CTA have been ordered-reviewed during June visit.  Smoking and alcohol cessation counseling provided.  Recommended starting Crestor 10 mg. => In follow-up visit noted intolerance of Crestor (discussion of maybe chest pain and anxiety related symptoms.) => referred to Pharm.D.  Initially tried pravastatin.  Also Zetia.  Noted that she had nausea and GI upset with Zetia..  Noted that she had started chemo chest oxygen therapy, but not not yet on CPAP.   Subjective  INTERVAL HPI She was seen on May 27, 2023 by Mr. Delma Officer for assistance with lipid management.  At this time it indicated that she was post to be taking  pravastatin 20 mg daily, fenofibrate 145 mg, Zetia 10 mg and colestipol-all of which she was no longer taking for various symptoms.  LDL is indicated 121. => Recommended  restarting pravastatin and fenofibrate.  Discussed the use of AI scribe software for clinical note transcription with the patient, who gave verbal consent to proceed.  History of Present Illness   The patient, with a history of high cholesterol and high blood pressure, presents with leg cramps. The patient reports being inactive since the death of her husband two years ago, which has led to weight gain and muscle loss. The patient admits to smoking, but not much, and only when stressed. The patient has been taking various medications, but there is confusion about which ones exactly. The patient is not currently taking any cholesterol medication due to side effects experienced in the past, including stomach upset and leg pain. The patient also reports having a bad hip and knee, which makes it hard to walk. The patient has been using oxygen for sleep and there is a suspicion of sleep apnea.   Sleeps with O2 -- hypoxia lying down => Pulm Med Suggested OSA -- but Polysomnogram denied => now on O2 qhs (sats have dropped to 70s).  Heart races with anxiety & activity.  Notes lots of MSK pains - hips, knees & legs, lots of cramping. Back DJD.  concerned about PAD - but does not note claudication Sx.   Notes being very inactive over last few years of exercising & caring for her husband --> now very deconditioned.  No exercise.  Multiple ?s about Potassium -- was on supplements & now drinking electrolyte replacements.  --> discussed just drinking  H20.  Eat bananas for potassium.    Reviewed Cor CTA results.  Discussed smoking.     Cardiovascular ROS: positive for - dyspnea on exertion, orthopnea, palpitations, rapid heart rate, shortness of breath, and intermittent episodes of sharp chest pain.  The rapid heart rates are associated with stress and anxiety along with chest pain.  Chronic dyspnea worse with exertion.  Hypoxic on lying down-orthopnea. negative for - edema, irregular heartbeat, or syncope or  near syncope, TIA or severe AS, claudication  ROS:  Review of Systems - Negative except various symptoms noted above.  Lots of musculoskeletal issues also GI issues and significant stress/anxiety.  Long conversation.  A lot of the visit was dedicated to trying to figure out what medications she was taking.  Did not bring her meds with her and making this very difficult.     Objective   Family History: Aunts from mother side: MI  Biological father : heart problems  Mother: diabetes  Social History:  Alcohol:3- 4 drinks per day  Tobacco smoking: 3-4 per day  Studies Reviewed: Marland Kitchen        ECHO 03/11/2023: Normal LV size and function.  EF 60 to 65% with no RWMA.  GR 1 DD.  Normal RV size and function.  Normal RVP and RAP.  Moderate MAC but no MS or MR.   Normal AoV.  Coronary CTA 01/19/2023:: CAC score 0.  Noncalcified ostial large D1 plaque (50 to 69%-CT FFR negative/nonobstructive).  Otherwise minimal noncalcified plaque in the proximal LAD and proximal RCA.  Lab Results  Component Value Date   NA 134 07/11/2023   K 4.6 07/11/2023   CREATININE 0.71 07/11/2023   EGFR 97 07/11/2023   GLUCOSE 95 07/11/2023    Lab Results  Component Value Date   CHOL 272 (H) 01/24/2023   HDL 60 01/24/2023   LDLCALC 121 (H) 01/24/2023   TRIG 514 (H) 01/24/2023   CHOLHDL 4.5 (H) 01/24/2023    Risk Assessment/Calculations:          Physical Exam:   VS:  BP 102/64 (BP Location: Left Arm, Patient Position: Sitting, Cuff Size: Normal)   Pulse (!) 105   Ht 5\' 3"  (1.6 m)   Wt 153 lb 11.2 oz (69.7 kg)   SpO2 95%   BMI 27.23 kg/m    Wt Readings from Last 3 Encounters:  07/23/23 153 lb 11.2 oz (69.7 kg)  07/21/23 153 lb (69.4 kg)  05/02/23 164 lb (74.4 kg)    GEN: Well nourished, well developed in no acute distress; well-groomed.  Very anxious and stressed. NECK: No JVD; No carotid bruits CARDIAC: Normal S1, S2; RRR, no murmurs, rubs, gallops RESPIRATORY:  Clear to auscultation without rales,  wheezing or rhonchi ; nonlabored, good air movement.  Mild interstitial sounds but nothing significant. ABDOMEN: Soft, non-tender, non-distended EXTREMITIES:  No edema; No deformity      ASSESSMENT AND PLAN: .    Problem List Items Addressed This Visit       Cardiology Problems   Coronary artery disease of native artery of native heart with stable angina pectoris (HCC) - Primary (Chronic)    Minimal disease noted-nonobstructive on Coronary CTA.  There is evidence of cholesterol plaque however and therefore warrants therapy with BP and lipid management as well as smoking cessation counseling. At this extensive coronary cascade with does not necessarily require aspirin although she is already taking aspirin 81 mg.      Relevant Medications   rosuvastatin (CRESTOR) 10  MG tablet   Other Relevant Orders   EKG 12-Lead (Completed)   Lipid panel   Comprehensive metabolic panel   Essential hypertension (Chronic)    She had history of hypertension related to Geodon.  Has subsequently had some elevated blood pressures and is now on olmesartan 20 mg daily.  Had been on HCTZ that was discontinued due to hypokalemia.  BP looks good today. Marland Kitchen PLAN Patient is currently on Olmesartan (Benicar) and has a history of hypokalemia. Recent potassium level was within normal range. -Continue Olmesartan. -Recheck potassium level in December along with Lipids.       Relevant Medications   rosuvastatin (CRESTOR) 10 MG tablet   Hyperlipidemia with target LDL less than 70; and hypertriglyceridemia (Chronic)    Based on coronary calcium and evidence of CAD, LDL <70 and TG <150  Patient has been off cholesterol medications due to side effects. Discussed restarting Crestor (Rosuvastatin) with a gradual dose increase. ->  Stop fenofibrate due to musculoskeletal pains, Zetia due to GI issues and Crestor because of chest pain.  I do not think that this is true intolerance of Crestor.  She is interested in  retrying.  Plan: -Restart Crestor 10 mg, beginning with half a tablet for the first week, then increase to full dose if tolerated. -Recheck lipids and chemistry in December.   Could also consider Vascepa up for high triglycerides. Did not tolerate fenofibrate.      Relevant Medications   rosuvastatin (CRESTOR) 10 MG tablet     Other   Alcohol addiction (HCC) (Chronic)    Stressed importance of cutting back alcohol into a stable/healthy level.      Anxiety (Chronic)    Likely a lot of her symptoms are psychosomatic related to anxiety.  Recommend discussing adequate options with PCP.  This may also be affecting her sleep.      Chest pain at rest    Not likely related to coronary artery disease based on Coronary CTA results.  Potentially could have some spasm but unlikely.      Fibromyalgia (Chronic)    I am quite sure that some of her symptoms are probably related to psychosomatic issues related to anxiety and fibromyalgia => this includes a lot of musculoskeletal symptoms associated with medications..      Generalized anxiety disorder (Chronic)    Definitely playing a major role.  She has had chest pain and palpitations associated with this.  Does not like she is taking Cymbalta anymore.  Need to discuss with PCP.  Simply using PRN benzodiazepines is not an adequate treatment option.  Especially for somebody who has alcohol abuse tendencies      Leg cramps (Chronic)    Patient reports leg cramps, particularly when stretching. No clear association with medication use. -Order leg artery dopplers and ankle-brachial index (ABI) in four months.      Medication management    Medication Reconciliation There is some confusion regarding the patient's current medication regimen. -At next visit, patient to bring all current medications for review and reconciliation.       OSA (obstructive sleep apnea)    Patient has been using oxygen at night and has been suspected of having sleep  apnea. Insurance has denied sleep study. -Continue to work with primary care provider to advocate for sleep study.      Tobacco abuse counseling (Chronic)   Tobacco use (Chronic)    Patient reports smoking approximately five cigarettes per day, primarily when stressed. -Encourage cessation of  tobacco use.      Other Visit Diagnoses     Bilateral leg cramps       Relevant Orders   VAS [place] ABI WITH/WO TBI               Dispo: No follow-ups on file.  Total time spent: 24 min spent with patient + 26 min spent charting = 50 min      Signed, Marykay Lex, MD, MS Bryan Lemma, M.D., M.S. Interventional Cardiologist  [facility]  Pager # [phone] Phone # [phone] [address] [place], [place] [address]

## 2023-07-23 NOTE — Patient Instructions (Addendum)
Medication Instructions:   At Next appt bring your medication you are taking   Restart taking Rosuvastatin 10 mg - the first week  take 1/2 tablet daily then increase to 10 mg , if you unable to  tolerate 10 mg then reduce back 5 mg .   *If you need a refill on your cardiac medications before your next appointment, please call your pharmacy*   Lab Work:  Lipid CMP  If you have labs (blood work) drawn today and your tests are completely normal, you will receive your results only by: MyChart Message (if you have MyChart) OR A paper copy in the mail If you have any lab test that is abnormal or we need to change your treatment, we will call you to review the results.   Testing/Procedures:  Will be schedule at [address] Your physician has requested that you have an ankle brachial index (ABI). During this test an ultrasound and blood pressure cuff are used to evaluate the arteries that supply the arms and legs with blood. Allow thirty minutes for this exam. There are no restrictions or special instructions.   Follow-Up: At [facility], you and your health needs are our priority.  As part of our continuing mission to provide you with exceptional heart care, we have created designated Provider Care Teams.  These Care Teams include your primary Cardiologist (physician) and Advanced Practice Providers (APPs -  Physician Assistants and Nurse Practitioners) who all work together to provide you with the care you need, when you need it.     Your next appointment:   4 month(s)  The format for your next appointment:   In Person  Provider:   Bernadene Person, NP    Then, Bryan Lemma, MD will plan to see you again in 12 month(s).   Other Instructions

## 2023-07-25 ENCOUNTER — Other Ambulatory Visit: Payer: BC Managed Care – PPO

## 2023-07-27 ENCOUNTER — Encounter: Payer: Self-pay | Admitting: Cardiology

## 2023-07-27 DIAGNOSIS — G4733 Obstructive sleep apnea (adult) (pediatric): Secondary | ICD-10-CM | POA: Insufficient documentation

## 2023-07-27 DIAGNOSIS — R252 Cramp and spasm: Secondary | ICD-10-CM | POA: Insufficient documentation

## 2023-07-27 DIAGNOSIS — Z79899 Other long term (current) drug therapy: Secondary | ICD-10-CM | POA: Insufficient documentation

## 2023-07-27 NOTE — Assessment & Plan Note (Signed)
Definitely playing a major role.  She has had chest pain and palpitations associated with this.  Does not like she is taking Cymbalta anymore.  Need to discuss with PCP.  Simply using PRN benzodiazepines is not an adequate treatment option.  Especially for somebody who has alcohol abuse tendencies

## 2023-07-27 NOTE — Assessment & Plan Note (Signed)
Based on coronary calcium and evidence of CAD, LDL <70 and TG <150  Patient has been off cholesterol medications due to side effects. Discussed restarting Crestor (Rosuvastatin) with a gradual dose increase. ->  Stop fenofibrate due to musculoskeletal pains, Zetia due to GI issues and Crestor because of chest pain.  I do not think that this is true intolerance of Crestor.  Sarah Reeves is interested in retrying.  Plan: -Restart Crestor 10 mg, beginning with half a tablet for the first week, then increase to full dose if tolerated. -Recheck lipids and chemistry in December.   Could also consider Vascepa up for high triglycerides. Did not tolerate fenofibrate.

## 2023-07-27 NOTE — Assessment & Plan Note (Signed)
Medication Reconciliation There is some confusion regarding the patient's current medication regimen. -At next visit, patient to bring all current medications for review and reconciliation.

## 2023-07-27 NOTE — Assessment & Plan Note (Signed)
Patient has been using oxygen at night and has been suspected of having sleep apnea. Insurance has denied sleep study. -Continue to work with primary care provider to advocate for sleep study.

## 2023-07-27 NOTE — Assessment & Plan Note (Signed)
Not likely related to coronary artery disease based on Coronary CTA results.  Potentially could have some spasm but unlikely.

## 2023-07-27 NOTE — Assessment & Plan Note (Signed)
Likely a lot of her symptoms are psychosomatic related to anxiety.  Recommend discussing adequate options with PCP.  This may also be affecting her sleep.

## 2023-07-27 NOTE — Assessment & Plan Note (Signed)
I am quite sure that some of her symptoms are probably related to psychosomatic issues related to anxiety and fibromyalgia => this includes a lot of musculoskeletal symptoms associated with medications.Marland Kitchen

## 2023-07-27 NOTE — Assessment & Plan Note (Signed)
Stressed importance of cutting back alcohol into a stable/healthy level.

## 2023-07-27 NOTE — Assessment & Plan Note (Signed)
Patient reports smoking approximately five cigarettes per day, primarily when stressed. -Encourage cessation of tobacco use.

## 2023-07-27 NOTE — Assessment & Plan Note (Signed)
Patient reports leg cramps, particularly when stretching. No clear association with medication use. -Order leg artery dopplers and ankle-brachial index (ABI) in four months.

## 2023-07-27 NOTE — Assessment & Plan Note (Signed)
Minimal disease noted-nonobstructive on Coronary CTA.  There is evidence of cholesterol plaque however and therefore warrants therapy with BP and lipid management as well as smoking cessation counseling. At this extensive coronary cascade with does not necessarily require aspirin although she is already taking aspirin 81 mg.

## 2023-07-27 NOTE — Assessment & Plan Note (Signed)
She had history of hypertension related to Geodon.  Has subsequently had some elevated blood pressures and is now on olmesartan 20 mg daily.  Had been on HCTZ that was discontinued due to hypokalemia.  BP looks good today. Marland Kitchen PLAN Patient is currently on Olmesartan (Benicar) and has a history of hypokalemia. Recent potassium level was within normal range. -Continue Olmesartan. -Recheck potassium level in December along with Lipids.

## 2023-07-31 ENCOUNTER — Encounter (HOSPITAL_COMMUNITY): Payer: BC Managed Care – PPO

## 2023-07-31 ENCOUNTER — Inpatient Hospital Stay (HOSPITAL_COMMUNITY)
Admission: RE | Admit: 2023-07-31 | Payer: BC Managed Care – PPO | Source: Ambulatory Visit | Attending: Cardiology | Admitting: Cardiology

## 2023-08-07 ENCOUNTER — Ambulatory Visit (HOSPITAL_COMMUNITY): Admit: 2023-08-07 | Payer: BC Managed Care – PPO | Admitting: Gastroenterology

## 2023-08-07 ENCOUNTER — Encounter (HOSPITAL_COMMUNITY): Payer: Self-pay

## 2023-08-07 SURGERY — ESOPHAGOGASTRODUODENOSCOPY (EGD) WITH PROPOFOL
Anesthesia: Monitor Anesthesia Care

## 2023-08-13 ENCOUNTER — Ambulatory Visit: Payer: BC Managed Care – PPO | Admitting: Adult Health

## 2023-08-13 ENCOUNTER — Encounter: Payer: Self-pay | Admitting: Adult Health

## 2023-08-13 VITALS — BP 110/70 | HR 93 | Temp 97.9°F | Ht 63.0 in | Wt 155.4 lb

## 2023-08-13 DIAGNOSIS — J9611 Chronic respiratory failure with hypoxia: Secondary | ICD-10-CM

## 2023-08-13 DIAGNOSIS — J4489 Other specified chronic obstructive pulmonary disease: Secondary | ICD-10-CM

## 2023-08-13 DIAGNOSIS — Z23 Encounter for immunization: Secondary | ICD-10-CM | POA: Diagnosis not present

## 2023-08-13 DIAGNOSIS — Z716 Tobacco abuse counseling: Secondary | ICD-10-CM | POA: Diagnosis not present

## 2023-08-13 NOTE — Patient Instructions (Addendum)
Continue on Breztri 2 puffs Twice daily  , rinse after use.  Xopenex  inhaler As needed   Continue Oxygen 2l/m At bedtime   Activity as tolerated Work on not smoking  Flu shot today.  Follow up in 4 months with  Dr. Aldean Ast and As needed   Please contact office for sooner follow up if symptoms do not improve or worsen or seek emergency care

## 2023-08-13 NOTE — Progress Notes (Unsigned)
@Patient  ID: Sarah Reeves, female    DOB: [birth year], 62 y.o.   MRN: [id number]  Chief Complaint  Patient presents with   Follow-up    Referring provider: Marcelle Overlie, MD  HPI: 62 year old female active smoker followed for COPD with asthma/emphysema and nocturnal hypoxemia  TEST/EVENTS :   home sleep study that was done on December 03, 2022, no significant sleep apnea with AHI at 3.4/hour and SpO2 low at 77%.  Average O2 saturations were 84%    Spirometry test in November 2023 showed minimal airflow obstruction.    LDCT chest January 02, 2023 shows no suspicious pulmonary nodules.  Mild bronchial wall thickening with mild emphysema.  Scattered areas of scarring in the lung bases.   12/2022 ONO shows nocturnal hypoxemia , O2 sats <88%- 6hr , avg 84%, low sats 77% . -started on O2 2l/m At bedtime     Coronary CT chest January 17, 2023 showed coronary calcium score at 0, noncalcified plaque in ostium of the large D1 branch causing moderate stenosis 50 to 69% minimal obstruction in the LAD and RCA, CT FFR suggest nonobstructive coronary disease, LAD: CTFFR 0.90 across lesion in ostial D1, suggesting lesion is not functionally significant  02/2023 2D echo shows pump function of your heart is normal grade 1 diastolic dysfunction -this is mild stiffness of the heart, right-sided your heart function and size appears normal.  No valvular stenosis is noted   08/13/2023 Follow up: COPD with asthma/emphysema and O2 RF     Allergies  Allergen Reactions   Keflex [Cephalexin] Hives, Itching and Other (See Comments)    Tongue and throat itching/tingling   Shellfish Allergy Other (See Comments)    Unknown reaction; "MD told me I was allergic"   Vancomycin Anaphylaxis    Face and tongue swells   Zoloft [Sertraline Hcl] Other (See Comments)    REACTION: Nightmares, Grind teeth really bad   Latex Itching    Itch and blisters from contact   Amitriptyline     Hot, itching, tongue tingling,  breakthrough bleeding.   Clindamycin/Lincomycin Hives and Other (See Comments)    Stated that this causes her to get really horse and made her feel really hot   Other Other (See Comments)   Penicillins    Pentosan Polysulfate Sodium Swelling    Throat swelling   Shellfish-Derived Products Other (See Comments)   Sulfa Antibiotics Other (See Comments)   Topamax [Topiramate]     Says it makes her "constipation and she can't urinate normally"   Uribel [Meth-Hyo-M Bl-Na Phos-Ph Sal] Nausea Only   Uribel [Urelle] Swelling and Other (See Comments)    Patient stated this makes her stomach swell and makes her feel si   Lamotrigine Rash   Sulfamethoxazole-Trimethoprim Rash    Patient developed feelings of dizziness, rash and itchy hands.  Patient then vomited and had diarrhea.    Immunization History  Administered Date(s) Administered   Influenza,inj,Quad PF,6+ Mos 06/27/2018, 07/07/2019   Pneumococcal Polysaccharide-23 06/27/2018   Td 10/21/1998   Td (Adult),5 Lf Tetanus Toxid, Preservative Free 10/21/1998   Unspecified SARS-COV-2 Vaccination 01/10/2020, 02/07/2020   Zoster Recombinant(Shingrix) 07/14/2019, 09/24/2019    Past Medical History:  Diagnosis Date   Allergy    Angio-edema    Anxiety    Arthritis    Asthma    Clostridioides difficile infection    Complication of anesthesia    takes alot to sedated   COPD (chronic obstructive pulmonary disease) (HCC)    Depression  Emphysema of lung (HCC)    Fibromyalgia    GERD (gastroesophageal reflux disease)    Hepatitis C    (Dr. Kinnie Scales) Treated with Harvoni March-May 2016   Herpes simplex    Hyperlipemia    Hypertension    Insomnia    Mental disorder    Neuropathy    Osteoporosis    Oxygen deficiency    PONV (postoperative nausea and vomiting)    Sleep apnea    Substance abuse (HCC)    Urticaria     Tobacco History: Social History   Tobacco Use  Smoking Status Every Day   Current packs/day: 1.00   Average  packs/day: 1 pack/day for 47.0 years (47.0 ttl pk-yrs)   Types: Cigarettes   Passive exposure: Current  Smokeless Tobacco Never  Tobacco Comments   Smoking here and there, not like she was.  02/06/2026 hfb   07/23/2023 patient smokes 5-6 cigarettes daily   Ready to quit: Not Answered Counseling given: Not Answered Tobacco comments: Smoking here and there, not like she was.  02/06/2026 hfb 07/23/2023 patient smokes 5-6 cigarettes daily   Outpatient Medications Prior to Visit  Medication Sig Dispense Refill   Ascorbic Acid (VITAMIN C) 1000 MG tablet Take 1,000 mg by mouth daily.     aspirin EC 81 MG tablet Take 1 tablet (81 mg total) by mouth daily. 90 tablet 3   Budeson-Glycopyrrol-Formoterol (BREZTRI AEROSPHERE) 160-9-4.8 MCG/ACT AERO Inhale 2 puffs into the lungs in the morning and at bedtime. Alternates between symbicort and Breztri 10.7 g 6   cetirizine (ZYRTEC) 10 MG tablet Take 1 tablet (10 mg total) by mouth daily. 30 tablet 5   cyclobenzaprine (FLEXERIL) 10 MG tablet Take 10 mg by mouth 3 (three) times daily as needed for muscle spasms.     diazepam (VALIUM) 5 MG tablet Take 1 tablet (5 mg total) by mouth 2 (two) times daily. 10 tablet 0   EPINEPHrine (AUVI-Q) 0.1 MG/0.1ML SOAJ Inject into the muscle.     estradiol (VIVELLE-DOT) 0.075 MG/24HR Place onto the skin.     gabapentin (NEURONTIN) 300 MG capsule Take 300 mg by mouth daily as needed (nerve pain).     levalbuterol (XOPENEX HFA) 45 MCG/ACT inhaler INHALE 2 PUFFS INTO THE LUNGS EVERY 4 HOURS AS NEEDED FOR WHEEZING 15 g 1   levalbuterol (XOPENEX) 1.25 MG/3ML nebulizer solution Take 1.25 mg by nebulization every 4 (four) hours as needed for wheezing. 72 mL 5   LORazepam (ATIVAN) 2 MG tablet Take 2 mg by mouth in the morning, at noon, and at bedtime.     olmesartan (BENICAR) 20 MG tablet Take 1 tablet (20 mg total) by mouth daily. 90 tablet 3   progesterone (PROMETRIUM) 100 MG capsule Take 100 mg by mouth at bedtime.      rosuvastatin (CRESTOR) 10 MG tablet Take 1 tablet (10 mg total) by mouth daily.     valACYclovir (VALTREX) 500 MG tablet Take 500 mg by mouth daily as needed (outbreaks).     Vitamin D, Cholecalciferol, 25 MCG (1000 UT) TABS Take 25 mcg by mouth daily. 60 tablet 3   zolpidem (AMBIEN) 10 MG tablet Take 10 mg by mouth at bedtime.     DULoxetine (CYMBALTA) 30 MG capsule Take 30 mg by mouth daily. (Patient not taking: Reported on 08/13/2023)     Na Sulfate-K Sulfate-Mg Sulf 17.5-3.13-1.6 GM/177ML SOLN Take 1 kit by mouth once. (Patient not taking: Reported on 07/23/2023)     nitroGLYCERIN (NITROSTAT) 0.4 MG  SL tablet Place 1 tablet (0.4 mg total) under the tongue every 5 (five) minutes as needed for chest pain. (Patient not taking: Reported on 07/23/2023) 25 tablet 2   Facility-Administered Medications Prior to Visit  Medication Dose Route Frequency Provider Last Rate Last Admin   chlorhexidine (HIBICLENS) 4 % liquid 1 application  1 application  Topical Once Chevis Pretty III, MD       chlorhexidine (HIBICLENS) 4 % liquid 1 application  1 application  Topical Once Chevis Pretty III, MD         Review of Systems:   Constitutional:   No  weight loss, night sweats,  Fevers, chills, fatigue, or  lassitude.  HEENT:   No headaches,  Difficulty swallowing,  Tooth/dental problems, or  Sore throat,                No sneezing, itching, ear ache, nasal congestion, post nasal drip,   CV:  No chest pain,  Orthopnea, PND, swelling in lower extremities, anasarca, dizziness, palpitations, syncope.   GI  No heartburn, indigestion, abdominal pain, nausea, vomiting, diarrhea, change in bowel habits, loss of appetite, bloody stools.   Resp: No shortness of breath with exertion or at rest.  No excess mucus, no productive cough,  No non-productive cough,  No coughing up of blood.  No change in color of mucus.  No wheezing.  No chest wall deformity  Skin: no rash or lesions.  GU: no dysuria, change in color of urine, no  urgency or frequency.  No flank pain, no hematuria   MS:  No joint pain or swelling.  No decreased range of motion.  No back pain.    Physical Exam  There were no vitals taken for this visit.  GEN: A/Ox3; pleasant , NAD, well nourished    HEENT:  [place]/AT,  EACs-clear, TMs-wnl, NOSE-clear, THROAT-clear, no lesions, no postnasal drip or exudate noted.   NECK:  Supple w/ fair ROM; no JVD; normal carotid impulses w/o bruits; no thyromegaly or nodules palpated; no lymphadenopathy.    RESP  Clear  P & A; w/o, wheezes/ rales/ or rhonchi. no accessory muscle use, no dullness to percussion  CARD:  RRR, no m/r/g, no peripheral edema, pulses intact, no cyanosis or clubbing.  GI:   Soft & nt; nml bowel sounds; no organomegaly or masses detected.   Musco: Warm bil, no deformities or joint swelling noted.   Neuro: alert, no focal deficits noted.    Skin: Warm, no lesions or rashes    Lab Results:  CBC    Component Value Date/Time   WBC 9.6 02/28/2023 0834   RBC 4.06 02/28/2023 0834   HGB 13.0 02/28/2023 0834   HCT 38.5 02/28/2023 0834   PLT 214 02/28/2023 0834   MCV 94.8 02/28/2023 0834   MCH 32.0 02/28/2023 0834   MCHC 33.8 02/28/2023 0834   RDW 14.3 02/28/2023 0834   LYMPHSABS 3.0 11/08/2022 0917   MONOABS 0.8 11/08/2022 0917   EOSABS 0.1 11/08/2022 0917   BASOSABS 0.1 11/08/2022 0917    BMET    Component Value Date/Time   NA 134 07/11/2023 0000   K 4.6 07/11/2023 0000   CL 96 07/11/2023 0000   CO2 22 07/11/2023 0000   GLUCOSE 95 07/11/2023 0000   GLUCOSE 136 (H) 02/28/2023 0834   BUN 8 07/11/2023 0000   CREATININE 0.71 07/11/2023 0000   CALCIUM 9.7 07/11/2023 0000   CALCIUM 9.1 11/08/2022 0917   GFRNONAA >60 02/28/2023 5409  GFRAA 101 11/10/2018 1014    BNP    Component Value Date/Time   BNP 66.0 03/09/2021 1300    ProBNP No results found for: "PROBNP"  Imaging: No results found.  Administration History     None          Latest Ref Rng &  Units 02/07/2023    9:54 AM  PFT Results  FVC-Pre L 3.23   FVC-Predicted Pre % 97   FVC-Post L 3.20   FVC-Predicted Post % 96   Pre FEV1/FVC % % 73   Post FEV1/FCV % % 75   FEV1-Pre L 2.34   FEV1-Predicted Pre % 91   FEV1-Post L 2.40   DLCO uncorrected ml/min/mmHg 16.29   DLCO UNC% % 80   DLCO corrected ml/min/mmHg 16.29   DLCO COR %Predicted % 80   DLVA Predicted % 74   TLC L 5.14   TLC % Predicted % 101   RV % Predicted % 84     No results found for: "NITRICOXIDE"      Assessment & Plan:   No problem-specific Assessment & Plan notes found for this encounter.     Rubye Oaks, NP 08/13/2023

## 2023-08-14 ENCOUNTER — Ambulatory Visit (HOSPITAL_COMMUNITY)
Admission: RE | Admit: 2023-08-14 | Discharge: 2023-08-14 | Disposition: A | Discharge status: Home / Self Care | Payer: BC Managed Care – PPO | Source: Ambulatory Visit | Attending: Cardiology | Admitting: Cardiology

## 2023-08-14 DIAGNOSIS — R252 Cramp and spasm: Secondary | ICD-10-CM | POA: Diagnosis not present

## 2023-08-14 LAB — VAS US ABI WITH/WO TBI
Left ABI: 1.09
Right ABI: 0.81

## 2023-08-14 NOTE — Assessment & Plan Note (Signed)
Currently well-controlled on present regimen.  Continue current maintenance regimen  Plan  Patient Instructions  Continue on Breztri 2 puffs Twice daily  , rinse after use.  Xopenex  inhaler As needed   Continue Oxygen 2l/m At bedtime   Activity as tolerated Work on not smoking  Flu shot today.  Follow up in 4 months with  Dr. Aldean Ast and As needed   Please contact office for sooner follow up if symptoms do not improve or worsen or seek emergency care

## 2023-08-14 NOTE — Assessment & Plan Note (Signed)
Nocturnal hypoxemia continue on O2 2 L at bedtime.  Goal is to have O2 saturations greater than 88 to 90%.  Recommended for an in lab sleep study as has high suspicion of underlying sleep apnea unfortunately insurance would not cover.  2D echo was unrevealing.  Cardiac workup has been unrevealing.  Does have COPD with some mild emphysema.  Plan  Patient Instructions  Continue on Breztri 2 puffs Twice daily  , rinse after use.  Xopenex  inhaler As needed   Continue Oxygen 2l/m At bedtime   Activity as tolerated Work on not smoking  Flu shot today.  Follow up in 4 months with  Dr. Aldean Ast and As needed   Please contact office for sooner follow up if symptoms do not improve or worsen or seek emergency care

## 2023-08-14 NOTE — Assessment & Plan Note (Signed)
Smoking cessation discussed 

## 2023-08-15 ENCOUNTER — Telehealth: Payer: Self-pay | Admitting: Cardiology

## 2023-08-15 DIAGNOSIS — M79602 Pain in left arm: Secondary | ICD-10-CM

## 2023-08-15 DIAGNOSIS — R6889 Other general symptoms and signs: Secondary | ICD-10-CM

## 2023-08-15 DIAGNOSIS — R252 Cramp and spasm: Secondary | ICD-10-CM

## 2023-08-15 NOTE — Telephone Encounter (Signed)
Patient did her ABI yesterday was told by tech she also needs a scan for LE Arterial Duplex. She would like to know if Dr Herbie Baltimore will order test for her.

## 2023-08-17 NOTE — Telephone Encounter (Signed)
The official report did not seem like this was necessary. TBI on 1 side was slightly abnormal, but not significant.  ABIs are both really normal.  I have been under the understanding that when ABIs are deemed to be abnormal, that the Dopplers are automatically ordered.  But this was not done at the time.  I have CCed Dr. Allyson Sabal to see if he wants to relook at the study to see if any additional studies are warranted to be done.  DH

## 2023-08-22 ENCOUNTER — Encounter: Payer: Self-pay | Admitting: *Deleted

## 2023-08-23 DIAGNOSIS — G4736 Sleep related hypoventilation in conditions classified elsewhere: Secondary | ICD-10-CM | POA: Diagnosis not present

## 2023-08-23 DIAGNOSIS — J449 Chronic obstructive pulmonary disease, unspecified: Secondary | ICD-10-CM | POA: Diagnosis not present

## 2023-08-24 NOTE — Telephone Encounter (Signed)
OK  -  We can order Dopplers  [facility]

## 2023-08-25 NOTE — Telephone Encounter (Signed)
Patient is calling to follow up on the second test that is needed. Patient stated she was confused on the results form the Vas [place] ABI test and would like to speak with the nurse in regards to this. Please advise.

## 2023-08-25 NOTE — Telephone Encounter (Signed)
Spoke with patient to advise of plan. Pt states left leg hurts "all the time off and on." Right knee and thigh are also painful all the time; pt describes this as a "jabbing" pain. Pt agreeable to proceeding with BLE arterial duplex. Order placed.  Pt states she her left arm is also painful "all the time" above elbow "towards the middle." States this has been constant for several months and feels like it's "deep down in the muscles."  Pt also reports recent deer tick bites and she currently has a rash on her arm ongoing x2 weeks that initially looked like a bullseye but now looks more like a horseshoe. Pt states she was tested for Lyme disease several months ago and was negative, but that she did not complete the course of antibiotics she was prescribed due to developing C. Diff. Encouraged pt to follow up with PCP or urgent care to have rash and symptoms assessed. She verbalizes understanding and agrees to do this tomorrow, but would also like to make Dr. Herbie Baltimore aware. Advised this information will be routed to provider for review. Pt expressed appreciation of call and assistance.

## 2023-08-26 NOTE — Telephone Encounter (Signed)
RN spoke to Dr Herbie Baltimore.  pEr Dr Herbie Baltimore patient can be schedule a Upper Extremity Arterial  doppler ( left arm

## 2023-08-28 ENCOUNTER — Ambulatory Visit (HOSPITAL_COMMUNITY)
Admission: RE | Admit: 2023-08-28 | Discharge: 2023-08-28 | Disposition: A | Discharge status: Home / Self Care | Payer: BC Managed Care – PPO | Source: Ambulatory Visit | Attending: Cardiology | Admitting: Cardiology

## 2023-08-28 ENCOUNTER — Ambulatory Visit (HOSPITAL_BASED_OUTPATIENT_CLINIC_OR_DEPARTMENT_OTHER)
Admission: RE | Admit: 2023-08-28 | Discharge: 2023-08-28 | Disposition: A | Discharge status: Home / Self Care | Payer: BC Managed Care – PPO | Source: Ambulatory Visit | Attending: Cardiology | Admitting: Cardiology

## 2023-08-28 DIAGNOSIS — M79602 Pain in left arm: Secondary | ICD-10-CM

## 2023-08-28 DIAGNOSIS — R6889 Other general symptoms and signs: Secondary | ICD-10-CM | POA: Insufficient documentation

## 2023-08-28 DIAGNOSIS — R252 Cramp and spasm: Secondary | ICD-10-CM | POA: Insufficient documentation

## 2023-08-29 ENCOUNTER — Telehealth: Payer: Self-pay | Admitting: *Deleted

## 2023-08-29 NOTE — Telephone Encounter (Signed)
The patient has been notified of the result and verbalized understanding.  All questions (if any) were answered.  Appointment schedule for  09/03/23 at 11 am . Patient aware and verbalized understading Tobin Chad, RN 08/29/2023 3:29 PM

## 2023-08-29 NOTE — Telephone Encounter (Signed)
-----   Message from Bryan Lemma sent at 08/29/2023  3:18 PM EST ----- After reviewing the original ABIs, there was some abnormality of the right ABI which is why we went forward with doing the Dopplers.  The original read had not called and abnormal, but then on relook it did appear to be abnormal which was suggested by the ultrasound tech.  The Dopplers do show a relatively significant blockage in the right iliac artery and I do think is reasonable for [place] to have you see Dr. Allyson Sabal who was involved in helping with a decision about proceeding with the Doppler studies.  DH

## 2023-08-29 NOTE — Telephone Encounter (Signed)
Yes lets go ahead forward with the Dopplers.

## 2023-09-03 ENCOUNTER — Ambulatory Visit: Payer: BC Managed Care – PPO | Attending: Cardiovascular Disease | Admitting: Cardiovascular Disease

## 2023-09-03 ENCOUNTER — Encounter: Payer: Self-pay | Admitting: Cardiovascular Disease

## 2023-09-03 VITALS — BP 128/70 | HR 93 | Ht 63.0 in | Wt 155.0 lb

## 2023-09-03 DIAGNOSIS — I739 Peripheral vascular disease, unspecified: Secondary | ICD-10-CM | POA: Diagnosis not present

## 2023-09-03 NOTE — Progress Notes (Signed)
09/03/2023 Sarah Reeves   03/18/1961  [id number]  Primary Physician Marcelle Overlie, MD Primary Cardiologist: Runell Gess MD FACP, [place], [place], [place]  HPI:  Sarah Reeves is a 62 y.o. moderately overweight widowed Caucasian female with no children who currently does not work and was referred by Dr. Herbie Baltimore for peripheral vascular evaluation.  Her risk factors include smoking 10 cigarettes a day down from 1 pack a day for last 45 years.  She drinks on a daily basis (4 mixed drinks in a day).  She has treated hypertension hyperlipidemia.  There is no family history of heart disease.  She is never had a heart attack or stroke.  She does have a history of anxiety.  She had Doppler studies performed 08/28/2023 revealing a right ABI of 0.81 and a left of 1.09 with a high-frequency signal in the origin of the right common iliac artery.  It is difficult to elicit history of claudication however.  She apparently does not walk much but it is unclear why.   Current Meds  Medication Sig   Ascorbic Acid (VITAMIN C) 1000 MG tablet Take 1,000 mg by mouth daily.   aspirin EC 81 MG tablet Take 1 tablet (81 mg total) by mouth daily.   Budeson-Glycopyrrol-Formoterol (BREZTRI AEROSPHERE) 160-9-4.8 MCG/ACT AERO Inhale 2 puffs into the lungs in the morning and at bedtime. Alternates between symbicort and Breztri   cetirizine (ZYRTEC) 10 MG tablet Take 1 tablet (10 mg total) by mouth daily.   cyclobenzaprine (FLEXERIL) 10 MG tablet Take 10 mg by mouth 3 (three) times daily as needed for muscle spasms.   EPINEPHrine (AUVI-Q) 0.1 MG/0.1ML SOAJ Inject into the muscle.   estradiol (VIVELLE-DOT) 0.075 MG/24HR Place onto the skin.   gabapentin (NEURONTIN) 300 MG capsule Take 300 mg by mouth daily as needed (nerve pain).   levalbuterol (XOPENEX HFA) 45 MCG/ACT inhaler INHALE 2 PUFFS INTO THE LUNGS EVERY 4 HOURS AS NEEDED FOR WHEEZING   levalbuterol (XOPENEX) 1.25 MG/3ML nebulizer solution Take 1.25 mg by  nebulization every 4 (four) hours as needed for wheezing.   LORazepam (ATIVAN) 2 MG tablet Take 2 mg by mouth in the morning, at noon, and at bedtime.   Na Sulfate-K Sulfate-Mg Sulf 17.5-3.13-1.6 GM/177ML SOLN Take 1 kit by mouth once.   olmesartan (BENICAR) 20 MG tablet Take 1 tablet (20 mg total) by mouth daily.   progesterone (PROMETRIUM) 100 MG capsule Take 100 mg by mouth at bedtime.   rosuvastatin (CRESTOR) 10 MG tablet Take 1 tablet (10 mg total) by mouth daily.   valACYclovir (VALTREX) 500 MG tablet Take 500 mg by mouth daily as needed (outbreaks).   Vitamin D, Cholecalciferol, 25 MCG (1000 UT) TABS Take 25 mcg by mouth daily.   zolpidem (AMBIEN) 10 MG tablet Take 10 mg by mouth at bedtime.     Allergies  Allergen Reactions   Keflex [Cephalexin] Hives, Itching and Other (See Comments)    Tongue and throat itching/tingling   Shellfish Allergy Other (See Comments)    Unknown reaction; "MD told me I was allergic"   Vancomycin Anaphylaxis    Face and tongue swells   Zoloft [Sertraline Hcl] Other (See Comments)    REACTION: Nightmares, Grind teeth really bad   Latex Itching    Itch and blisters from contact   Amitriptyline     Hot, itching, tongue tingling, breakthrough bleeding.   Clindamycin/Lincomycin Hives and Other (See Comments)    Stated that this causes her  to get really horse and made her feel really hot   Other Other (See Comments)   Penicillins    Pentosan Polysulfate Sodium Swelling    Throat swelling   Shellfish-Derived Products Other (See Comments)   Sulfa Antibiotics Other (See Comments)   Topamax [Topiramate]     Says it makes her "constipation and she can't urinate normally"   Uribel [Meth-Hyo-M Bl-Na Phos-Ph Sal] Nausea Only   Uribel [Urelle] Swelling and Other (See Comments)    Patient stated this makes her stomach swell and makes her feel si   Lamotrigine Rash   Sulfamethoxazole-Trimethoprim Rash    Patient developed feelings of dizziness, rash and itchy  hands.  Patient then vomited and had diarrhea.    Social History   Socioeconomic History   Marital status: Married    Spouse name: Not on file   Number of children: 0   Years of education: 12   Highest education level: Not on file  Occupational History   Occupation: Retired   Occupation: retired  Tobacco Use   Smoking status: Every Day    Current packs/day: 1.00    Average packs/day: 1 pack/day for 47.0 years (47.0 ttl pk-yrs)    Types: Cigarettes    Passive exposure: Current   Smokeless tobacco: Never   Tobacco comments:    Smoking here and there, not like she was.  02/06/2026 hfb    07/23/2023 patient smokes 5-6 cigarettes daily  Vaping Use   Vaping status: Never Used  Substance and Sexual Activity   Alcohol use: Yes    Comment: daily   Drug use: No   Sexual activity: Yes    Birth control/protection: Surgical  Other Topics Concern   Not on file  Social History Narrative   Patient is right handed.   Four cups caffeine per day.   Lives at home with husband.   Social Determinants of Health   Financial Resource Strain: Not on file  Food Insecurity: Not on file  Transportation Needs: Not on file  Physical Activity: Not on file  Stress: Not on file  Social Connections: Unknown (02/25/2023)   Received from [facility], [facility]   Social Network    Social Network: Not on file  Intimate Partner Violence: Unknown (02/25/2023)   Received from [facility], [facility]   HITS    Physically Hurt: Not on file    Insult or Talk Down To: Not on file    Threaten Physical Harm: Not on file    Scream or Curse: Not on file     Review of Systems: General: negative for chills, fever, night sweats or weight changes.  Cardiovascular: negative for chest pain, dyspnea on exertion, edema, orthopnea, palpitations, paroxysmal nocturnal dyspnea or shortness of breath Dermatological: negative for rash Respiratory: negative for cough or wheezing Urologic: negative for  hematuria Abdominal: negative for nausea, vomiting, diarrhea, bright red blood per rectum, melena, or hematemesis Neurologic: negative for visual changes, syncope, or dizziness All other systems reviewed and are otherwise negative except as noted above.    Blood pressure 128/70, pulse 93, height 5\' 3"  (1.6 m), weight 155 lb (70.3 kg), SpO2 95%.  General appearance: alert and no distress Neck: no adenopathy, no carotid bruit, no JVD, supple, symmetrical, trachea midline, and thyroid not enlarged, symmetric, no tenderness/mass/nodules Lungs: clear to auscultation bilaterally Heart: regular rate and rhythm, S1, S2 normal, no murmur, click, rub or gallop Extremities: extremities normal, atraumatic, no cyanosis or edema Pulses: 2+ and symmetric Skin:  Skin color, texture, turgor normal. No rashes or lesions Neurologic: Grossly normal  EKG not performed today      ASSESSMENT AND PLAN:   Peripheral arterial disease (HCC) Sarah Reeves  was referred to me by Dr. Herbie Baltimore for evaluation of PAD.  She has a history of nonobstructive CAD, continue tobacco abuse, treated hypertension hyperlipidemia.  She had Doppler studies performed 08/28/2023 revealing a right ABI of 0.81 and a left of 1.09.  She decreased signal at the origin of the right common leg artery.  I cannot really elicit a good story for claudication.  She complains of pain in her hip and knee.  She does not ambulate much but do not think it is because of claudication.  I will see her back in 6 months and reevaluate at that time.     Runell Gess MD FACP,FACC,FAHA, [facility] 09/03/2023 11:55 AM

## 2023-09-03 NOTE — Assessment & Plan Note (Signed)
Sarah Reeves  was referred to me by Dr. Herbie Baltimore for evaluation of PAD.  She has a history of nonobstructive CAD, continue tobacco abuse, treated hypertension hyperlipidemia.  She had Doppler studies performed 08/28/2023 revealing a right ABI of 0.81 and a left of 1.09.  She decreased signal at the origin of the right common leg artery.  I cannot really elicit a good story for claudication.  She complains of pain in her hip and knee.  She does not ambulate much but do not think it is because of claudication.  I will see her back in 6 months and reevaluate at that time.

## 2023-09-03 NOTE — Patient Instructions (Signed)
Medication Instructions:  Your physician recommends that you continue on your current medications as directed. Please refer to the Current Medication list given to you today.  *If you need a refill on your cardiac medications before your next appointment, please call your pharmacy*   Follow-Up: At [facility], you and your health needs are our priority.  As part of our continuing mission to provide you with exceptional heart care, we have created designated Provider Care Teams.  These Care Teams include your primary Cardiologist (physician) and Advanced Practice Providers (APPs -  Physician Assistants and Nurse Practitioners) who all work together to provide you with the care you need, when you need it.  We recommend signing up for the patient portal called "MyChart".  Sign up information is provided on this After Visit Summary.  MyChart is used to connect with patients for Virtual Visits (Telemedicine).  Patients are able to view lab/test results, encounter notes, upcoming appointments, etc.  Non-urgent messages can be sent to your provider as well.   To learn more about what you can do with MyChart, go to [email or web address].    Your next appointment:   6 month(s)  Provider:   Jonathan Berry, MD    

## 2023-09-05 ENCOUNTER — Telehealth: Payer: Self-pay | Admitting: Cardiovascular Disease

## 2023-09-05 NOTE — Telephone Encounter (Signed)
Patient called in again regarding same issue as previously note. She seems to not have remembered talking to triage nurse earlier today. She states that Dr. Allyson Sabal spent 5 minutes with her, told her she had a blockage but that there was nothing that could be done right now. She reports she has right knee pain that throbs and the bottom of her right foot hurts. She also has DDD - back pain. She reports her calf/leg hurts when she walks. She "hurts all the time." She reports pain when driving too.  She also states she was not really informed about her condition, plan of care. She would like to know her options.   She goes on about a lot of problems with our office, doctors in general. She also states she is on inhalers for her asthma/COPD and this is causing issues with her eyes and complains she cannot get a referral to an eye doctor. She complains of issues r/t tick bites, c.diff. lung disease, pain. She reports her husband was sick, passed away so she neglected care for herself for a period of time. She complains about not being able to get in with gynecologist either.   She is very discouraged, said this is "very disheartening."    Advised her previous message has been sent to Dr. Allyson Sabal and will send to her general cardiologist also

## 2023-09-05 NOTE — Telephone Encounter (Signed)
Patient identification verified by 2 forms. Sarah Rail, RN   Called and spoke to patient  Patient states:   -was not given any information at previous appointment   -does not understand why nothing will be done if she has blockages   -is concerned about not having any changes and only following up in 6 months   -would like more information about her results and plan for treatment   -may get a second opinion  Informed patient message sent to Dr. Allyson Sabal  Patient verbalized understanding, no questions at this time

## 2023-09-05 NOTE — Telephone Encounter (Signed)
Patient would like a call back to discuss recent appt and results. States she is unhappy about recent appt and would like to have results mailed and to speak with nurse. Please advise.

## 2023-09-05 NOTE — Telephone Encounter (Signed)
Patient is following up. She reiterates that she is unsatisfied with recent consultation with Dr. Allyson Sabal states MD was double booked and she only saw him for about 10 minutes. She states he mentioned a clogged artery and that it would be dangerous to proceed with the procedure with no further advisement. She states she was unable to ask any questions and was given a card, and advised to follow up in 6 months. She states she does not use MyChart and does not want any messages sent online. She states [employer] notified her that her e-mail was hacked and she no longer wants any information in her chart. She states that she prefers letters or a phone call.

## 2023-09-17 NOTE — Telephone Encounter (Signed)
I was on leave when this was sent and I will need to resubmit a new authorization request.

## 2023-09-22 DIAGNOSIS — G4736 Sleep related hypoventilation in conditions classified elsewhere: Secondary | ICD-10-CM | POA: Diagnosis not present

## 2023-09-22 DIAGNOSIS — J449 Chronic obstructive pulmonary disease, unspecified: Secondary | ICD-10-CM | POA: Diagnosis not present

## 2023-10-07 ENCOUNTER — Telehealth: Payer: Self-pay | Admitting: Cardiology

## 2023-10-07 ENCOUNTER — Other Ambulatory Visit: Payer: Self-pay

## 2023-10-07 MED ORDER — CILOSTAZOL 50 MG PO TABS: 50.0000 mg | ORAL_TABLET | Freq: Two times a day (BID) | ORAL | 3 refills | Status: DC

## 2023-10-07 MED ORDER — NITROGLYCERIN 0.4 MG SL SUBL: 0.4000 mg | SUBLINGUAL_TABLET | SUBLINGUAL | 11 refills | Status: DC | PRN

## 2023-10-07 NOTE — Telephone Encounter (Signed)
Per Pt Dr Sarah Reeves told her there is medication she can take for her leg pain but no one prescribed it. Please advise

## 2023-10-07 NOTE — Telephone Encounter (Signed)
Spoke to patient Dr.Berry advised to take Pletal 50 mg twice a day.Advised to call back if continues to have pain.

## 2023-10-07 NOTE — Telephone Encounter (Signed)
Spoke to patient she stated she is having pain in right groin,right knee and foot.She recently saw Dr.Berry.Stated she was told there is medication that can be prescribed to help.Advised I will send message to [facility] for advice.

## 2023-10-23 DIAGNOSIS — G4736 Sleep related hypoventilation in conditions classified elsewhere: Secondary | ICD-10-CM | POA: Diagnosis not present

## 2023-10-23 DIAGNOSIS — J449 Chronic obstructive pulmonary disease, unspecified: Secondary | ICD-10-CM | POA: Diagnosis not present

## 2023-11-20 ENCOUNTER — Other Ambulatory Visit: Payer: Self-pay | Admitting: Family Medicine

## 2023-11-23 DIAGNOSIS — G4736 Sleep related hypoventilation in conditions classified elsewhere: Secondary | ICD-10-CM | POA: Diagnosis not present

## 2023-11-23 DIAGNOSIS — J449 Chronic obstructive pulmonary disease, unspecified: Secondary | ICD-10-CM | POA: Diagnosis not present

## 2023-11-24 ENCOUNTER — Ambulatory Visit: Payer: BC Managed Care – PPO | Admitting: Nurse Practitioner

## 2023-11-28 ENCOUNTER — Ambulatory Visit: Payer: BC Managed Care – PPO | Admitting: Nurse Practitioner

## 2023-12-08 ENCOUNTER — Ambulatory Visit: Payer: Self-pay | Admitting: Pulmonary Disease

## 2023-12-08 NOTE — Telephone Encounter (Signed)
The patient reported that she is experiencing a sudden onset of 10/10 back pain that she described as a horse kicking her in her lower mid back.  She has a history of sciatica and a 3 bulging discs.  Her leg "has been going out" and the pain has radiated to her groin and foot for an unknown timeframe.  She does not recall injuring herself.  She stated that she would like to cancel and possibly rescheduled her upcoming pulmonology appointment 12/15/23.  She kept saying, "I can't think," when asked about availability to reschedule.  Advised the patient to go to the emergency department for immediate evaluation.  She said she didn't know what to do.  Offered to call 911 on her behalf and she declined stating that she would take care of it.  Recommended that the patient call back for follow up regarding canceling and rescheduling her appointment after her back pain has been addressed.  She agreed stating that she doesn't want to be penalized by not showing up.  Called and spoke to Marina with scheduling who made an appointment note regarding patient's concern about upcoming appointment. Reason for Disposition  [1] SEVERE back pain (e.g., excruciating) AND [2] sudden onset AND [3] age > 60 years  Answer Assessment - Initial Assessment Questions 1. ONSET: "When did the pain begin?"      today 2. LOCATION: "Where does it hurt?" (upper, mid or lower back)     Lower mid back  3. SEVERITY: "How bad is the pain?"  (e.g., Scale 1-10; mild, moderate, or severe)   - MILD (1-3): Doesn't interfere with normal activities.    - MODERATE (4-7): Interferes with normal activities or awakens from sleep.    - SEVERE (8-10): Excruciating pain, unable to do any normal activities.      10/10 4. PATTERN: "Is the pain constant?" (e.g., yes, no; constant, intermittent)      Constant  5. RADIATION: "Does the pain shoot into your legs or somewhere else?"     No  6. CAUSE:  "What do you think is causing the back pain?"       unknown 7. BACK OVERUSE:  "Any recent lifting of heavy objects, strenuous work or exercise?"     No  8. MEDICINES: "What have you taken so far for the pain?" (e.g., nothing, acetaminophen, NSAIDS)     Muscle relaxer  9. NEUROLOGIC SYMPTOMS: "Do you have any weakness, numbness, or problems with bowel/bladder control?"     Cough I pee a little - happens  10. OTHER SYMPTOMS: "Do you have any other symptoms?" (e.g., fever, abdomen pain, burning with urination, blood in urine)       Feels like a horse kicked me  Protocols used: Back Pain-A-AH

## 2023-12-15 ENCOUNTER — Ambulatory Visit: Payer: BC Managed Care – PPO | Admitting: Pulmonary Disease

## 2023-12-15 DIAGNOSIS — Z6825 Body mass index (BMI) 25.0-25.9, adult: Secondary | ICD-10-CM | POA: Diagnosis not present

## 2023-12-15 DIAGNOSIS — Z124 Encounter for screening for malignant neoplasm of cervix: Secondary | ICD-10-CM | POA: Diagnosis not present

## 2023-12-15 DIAGNOSIS — Z01419 Encounter for gynecological examination (general) (routine) without abnormal findings: Secondary | ICD-10-CM | POA: Diagnosis not present

## 2023-12-15 DIAGNOSIS — Z1231 Encounter for screening mammogram for malignant neoplasm of breast: Secondary | ICD-10-CM | POA: Diagnosis not present

## 2023-12-21 DIAGNOSIS — J449 Chronic obstructive pulmonary disease, unspecified: Secondary | ICD-10-CM | POA: Diagnosis not present

## 2023-12-21 DIAGNOSIS — G4736 Sleep related hypoventilation in conditions classified elsewhere: Secondary | ICD-10-CM | POA: Diagnosis not present

## 2023-12-25 ENCOUNTER — Ambulatory Visit: Payer: BC Managed Care – PPO | Admitting: Pulmonary Disease

## 2024-01-01 ENCOUNTER — Ambulatory Visit: Payer: BC Managed Care – PPO | Attending: Nurse Practitioner | Admitting: Nurse Practitioner

## 2024-01-01 ENCOUNTER — Encounter: Payer: Self-pay | Admitting: Nurse Practitioner

## 2024-01-01 VITALS — BP 112/70 | HR 95 | Ht 63.5 in | Wt 146.0 lb

## 2024-01-01 DIAGNOSIS — E785 Hyperlipidemia, unspecified: Secondary | ICD-10-CM | POA: Diagnosis not present

## 2024-01-01 DIAGNOSIS — Z720 Tobacco use: Secondary | ICD-10-CM

## 2024-01-01 DIAGNOSIS — I10 Essential (primary) hypertension: Secondary | ICD-10-CM | POA: Diagnosis not present

## 2024-01-01 DIAGNOSIS — I2510 Atherosclerotic heart disease of native coronary artery without angina pectoris: Secondary | ICD-10-CM | POA: Diagnosis not present

## 2024-01-01 DIAGNOSIS — G4733 Obstructive sleep apnea (adult) (pediatric): Secondary | ICD-10-CM

## 2024-01-01 DIAGNOSIS — R079 Chest pain, unspecified: Secondary | ICD-10-CM

## 2024-01-01 DIAGNOSIS — F1029 Alcohol dependence with unspecified alcohol-induced disorder: Secondary | ICD-10-CM

## 2024-01-01 DIAGNOSIS — J4489 Other specified chronic obstructive pulmonary disease: Secondary | ICD-10-CM

## 2024-01-01 DIAGNOSIS — F411 Generalized anxiety disorder: Secondary | ICD-10-CM

## 2024-01-01 NOTE — Patient Instructions (Signed)
 Medication Instructions:  Your physician recommends that you continue on your current medications as directed. Please refer to the Current Medication list given to you today.  *If you need a refill on your cardiac medications before your next appointment, please call your pharmacy*   Lab Work: NONE ordered at this time of appointment    Testing/Procedures: NONE ordered at this time of appointment   Follow-Up: At [facility], you and your health needs are our priority.  As part of our continuing mission to provide you with exceptional heart care, we have created designated Provider Care Teams.  These Care Teams include your primary Cardiologist (physician) and Advanced Practice Providers (APPs -  Physician Assistants and Nurse Practitioners) who all work together to provide you with the care you need, when you need it.  We recommend signing up for the patient portal called "MyChart".  Sign up information is provided on this After Visit Summary.  MyChart is used to connect with patients for Virtual Visits (Telemedicine).  Patients are able to view lab/test results, encounter notes, upcoming appointments, etc.  Non-urgent messages can be sent to your provider as well.   To learn more about what you can do with MyChart, go to [email or web address].    Your next appointment:    4-6 months Dr. Herbie [place] May 2025 Dr. Allyson Sabal (recall previously placed) 1-2 months Pharmacy  Provider:   Bryan Lemma, MD
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
## 2024-01-02 ENCOUNTER — Telehealth: Payer: Self-pay | Admitting: *Deleted

## 2024-01-02 MED ORDER — BREZTRI AEROSPHERE 160-9-4.8 MCG/ACT IN AERO: 2.0000 | INHALATION_SPRAY | Freq: Two times a day (BID) | RESPIRATORY_TRACT | 6 refills | Status: DC

## 2024-01-02 NOTE — Telephone Encounter (Signed)
 Received fax from [employer] informing our office that the patient had been filling Breztri and Symbicort.  Rubye Oaks NP asked that I call the patient and find out what inhaler she is using.    I called and spoke with the patient, she stated that she was alternating between [facility] and Symbicort as the instructions said to do.  I looked at Tammy's last office visit note and she was instructed to use Breztri 2 puffs twice daily.  I advised her that we do not normally alternate between two maintenance inhalers.  I advised her to use Breztri 2 puffs twice daily and if that was not controlling her breathing to call our office and let us know.  She stated she would need a prescription for the Breztri to get her to her follow up visit.  Advised I would make sure she had refills available.  I also scheduled her a f/u visit with Tammy Parrett NP on 02/10/24 at 1:30 pm, advised to arrive by 1:15 pm for check in.  Nothing further needed.

## 2024-01-06 ENCOUNTER — Ambulatory Visit
Admission: RE | Admit: 2024-01-06 | Discharge: 2024-01-06 | Disposition: A | Discharge status: Home / Self Care | Payer: BC Managed Care – PPO | Source: Ambulatory Visit | Attending: Acute Care | Admitting: Acute Care

## 2024-01-06 DIAGNOSIS — Z87891 Personal history of nicotine dependence: Secondary | ICD-10-CM

## 2024-01-06 DIAGNOSIS — F17210 Nicotine dependence, cigarettes, uncomplicated: Secondary | ICD-10-CM | POA: Diagnosis not present

## 2024-01-06 DIAGNOSIS — Z122 Encounter for screening for malignant neoplasm of respiratory organs: Secondary | ICD-10-CM | POA: Diagnosis not present

## 2024-01-13 ENCOUNTER — Other Ambulatory Visit: Payer: Self-pay

## 2024-01-13 DIAGNOSIS — E785 Hyperlipidemia, unspecified: Secondary | ICD-10-CM | POA: Diagnosis not present

## 2024-01-13 LAB — LIPID PANEL
Chol/HDL Ratio: 2.1 ratio (ref 0.0–4.4)
Cholesterol, Total: 272 mg/dL — ABNORMAL HIGH (ref 100–199)
HDL: 132 mg/dL (ref >39)
LDL Chol Calc (NIH): 127 mg/dL — ABNORMAL HIGH (ref 0–99)
Triglycerides: 84 mg/dL (ref 0–149)
VLDL Cholesterol Cal: 13 mg/dL (ref 5–40)

## 2024-01-14 ENCOUNTER — Emergency Department (HOSPITAL_COMMUNITY)
Admission: EM | Admit: 2024-01-14 | Discharge: 2024-01-14 | Disposition: A | Discharge status: Home / Self Care | Attending: Emergency Medicine | Admitting: Emergency Medicine

## 2024-01-14 ENCOUNTER — Encounter (HOSPITAL_COMMUNITY): Payer: Self-pay

## 2024-01-14 ENCOUNTER — Emergency Department (HOSPITAL_COMMUNITY)

## 2024-01-14 ENCOUNTER — Other Ambulatory Visit: Payer: Self-pay

## 2024-01-14 DIAGNOSIS — F1010 Alcohol abuse, uncomplicated: Secondary | ICD-10-CM | POA: Insufficient documentation

## 2024-01-14 DIAGNOSIS — Z91040 Latex allergy status: Secondary | ICD-10-CM | POA: Insufficient documentation

## 2024-01-14 DIAGNOSIS — Z79899 Other long term (current) drug therapy: Secondary | ICD-10-CM | POA: Diagnosis not present

## 2024-01-14 DIAGNOSIS — I10 Essential (primary) hypertension: Secondary | ICD-10-CM | POA: Insufficient documentation

## 2024-01-14 DIAGNOSIS — F419 Anxiety disorder, unspecified: Secondary | ICD-10-CM | POA: Insufficient documentation

## 2024-01-14 DIAGNOSIS — Z7951 Long term (current) use of inhaled steroids: Secondary | ICD-10-CM | POA: Insufficient documentation

## 2024-01-14 DIAGNOSIS — Z7982 Long term (current) use of aspirin: Secondary | ICD-10-CM | POA: Diagnosis not present

## 2024-01-14 DIAGNOSIS — E871 Hypo-osmolality and hyponatremia: Secondary | ICD-10-CM | POA: Diagnosis not present

## 2024-01-14 DIAGNOSIS — I2510 Atherosclerotic heart disease of native coronary artery without angina pectoris: Secondary | ICD-10-CM | POA: Insufficient documentation

## 2024-01-14 DIAGNOSIS — R0789 Other chest pain: Secondary | ICD-10-CM | POA: Diagnosis not present

## 2024-01-14 DIAGNOSIS — R0902 Hypoxemia: Secondary | ICD-10-CM | POA: Diagnosis not present

## 2024-01-14 DIAGNOSIS — J449 Chronic obstructive pulmonary disease, unspecified: Secondary | ICD-10-CM | POA: Insufficient documentation

## 2024-01-14 DIAGNOSIS — R079 Chest pain, unspecified: Secondary | ICD-10-CM | POA: Diagnosis not present

## 2024-01-14 DIAGNOSIS — R000 Tachycardia, unspecified: Secondary | ICD-10-CM | POA: Diagnosis not present

## 2024-01-14 LAB — CBC
HCT: 37.2 % (ref 36.0–46.0)
Hemoglobin: 12.8 g/dL (ref 12.0–15.0)
MCH: 32.2 pg (ref 26.0–34.0)
MCHC: 34.4 g/dL (ref 30.0–36.0)
MCV: 93.7 fL (ref 80.0–100.0)
Platelets: 210 10*3/uL (ref 150–400)
RBC: 3.97 MIL/uL (ref 3.87–5.11)
RDW: 12.6 % (ref 11.5–15.5)
WBC: 10 10*3/uL (ref 4.0–10.5)
nRBC: 0 % (ref 0.0–0.2)

## 2024-01-14 LAB — HEPATIC FUNCTION PANEL
ALT: 36 U/L (ref 0–44)
AST: 32 U/L (ref 15–41)
Albumin: 3.5 g/dL (ref 3.5–5.0)
Alkaline Phosphatase: 44 U/L (ref 38–126)
Bilirubin, Direct: <0.1 mg/dL (ref 0.0–0.2)
Total Bilirubin: 0.7 mg/dL (ref 0.0–1.2)
Total Protein: 5.7 g/dL — ABNORMAL LOW (ref 6.5–8.1)

## 2024-01-14 LAB — BASIC METABOLIC PANEL
Anion gap: 14 (ref 5–15)
BUN: <5 mg/dL — ABNORMAL LOW (ref 8–23)
CO2: 23 mmol/L (ref 22–32)
Calcium: 9.7 mg/dL (ref 8.9–10.3)
Chloride: 90 mmol/L — ABNORMAL LOW (ref 98–111)
Creatinine, Ser: 0.64 mg/dL (ref 0.44–1.00)
GFR, Estimated: >60 mL/min (ref >60)
Glucose, Bld: 92 mg/dL (ref 70–99)
Potassium: 4 mmol/L (ref 3.5–5.1)
Sodium: 127 mmol/L — ABNORMAL LOW (ref 135–145)

## 2024-01-14 LAB — URINALYSIS, ROUTINE W REFLEX MICROSCOPIC
Bilirubin Urine: NEGATIVE
Glucose, UA: NEGATIVE mg/dL
Hgb urine dipstick: NEGATIVE
Ketones, ur: 5 mg/dL — AB
Leukocytes,Ua: NEGATIVE
Nitrite: NEGATIVE
Protein, ur: NEGATIVE mg/dL
Specific Gravity, Urine: 1.002 — ABNORMAL LOW (ref 1.005–1.030)
pH: 6 (ref 5.0–8.0)

## 2024-01-14 LAB — TROPONIN I (HIGH SENSITIVITY)
Troponin I (High Sensitivity): 4 ng/L (ref <18)
Troponin I (High Sensitivity): 4 ng/L (ref <18)

## 2024-01-14 LAB — LIPASE, BLOOD: Lipase: 30 U/L (ref 11–51)

## 2024-01-14 LAB — ETHANOL: Alcohol, Ethyl (B): <10 mg/dL (ref <10)

## 2024-01-14 MED ORDER — CHLORDIAZEPOXIDE HCL 25 MG PO CAPS: ORAL_CAPSULE | ORAL | 0 refills | Status: DC

## 2024-01-14 MED ORDER — SODIUM CHLORIDE 0.9 % IV BOLUS
1000.0000 mL | Freq: Once | INTRAVENOUS | Status: AC
  Administered 2024-01-14: 1000 mL via INTRAVENOUS

## 2024-01-14 MED ORDER — FAMOTIDINE IN NACL 20-0.9 MG/50ML-% IV SOLN
20.0000 mg | Freq: Once | INTRAVENOUS | Status: AC
Start: 2024-01-14 — End: 2024-01-14
  Administered 2024-01-14: 20 mg via INTRAVENOUS
  Filled 2024-01-14: qty 50

## 2024-01-14 MED ORDER — LORAZEPAM 2 MG/ML IJ SOLN
1.0000 mg | Freq: Once | INTRAMUSCULAR | Status: AC
  Administered 2024-01-14: 1 mg via INTRAVENOUS
  Filled 2024-01-14: qty 1

## 2024-01-14 NOTE — ED Provider Notes (Signed)
 [place] EMERGENCY DEPARTMENT AT [facility] Provider Note   CSN: [id number] Arrival date & time: 01/14/24  1841     History {Add pertinent medical, surgical, social history, OB history to HPI:1} Chief Complaint  Patient presents with   Chest Pain    Sarah Reeves is a 63 y.o. female.  HPI     Home Medications Prior to Admission medications   Medication Sig Start Date End Date Taking? Authorizing Provider  Ascorbic Acid (VITAMIN C) 1000 MG tablet Take 1,000 mg by mouth daily.    [provider]  aspirin EC 81 MG tablet Take 1 tablet (81 mg total) by mouth daily. 08/04/18   Marykay Lex, MD  Budeson-Glycopyrrol-Formoterol (BREZTRI AEROSPHERE) 160-9-4.8 MCG/ACT AERO Inhale 2 puffs into the lungs in the morning and at bedtime. Alternates between symbicort and Breztri 04/25/23   Olalere, Onnie Boer A, MD  budeson-glycopyrrolate-formoterol (BREZTRI AEROSPHERE) 160-9-4.8 MCG/ACT AERO Inhale 2 puffs into the lungs in the morning and at bedtime. 01/02/24   Parrett, Virgel Bouquet, NP  cetirizine (ZYRTEC) 10 MG tablet Take 1 tablet (10 mg total) by mouth daily. 09/09/22   Hetty Blend, FNP  cilostazol (PLETAL) 50 MG tablet Take 1 tablet (50 mg total) by mouth 2 (two) times daily. Patient not taking: Reported on 01/01/2024 10/07/23   Runell Gess, MD  cyclobenzaprine (FLEXERIL) 10 MG tablet Take 10 mg by mouth 3 (three) times daily as needed for muscle spasms.    [provider]  diazepam (VALIUM) 5 MG tablet Take 1 tablet (5 mg total) by mouth 2 (two) times daily. Patient not taking: Reported on 01/01/2024 11/08/22   Glyn Ade, MD  DULoxetine (CYMBALTA) 30 MG capsule Take 30 mg by mouth daily. Patient not taking: Reported on 01/01/2024 07/31/18   [provider]  EPINEPHrine (AUVI-Q) 0.1 MG/0.1ML SOAJ Inject into the muscle. 08/18/18   [provider]  estradiol (VIVELLE-DOT) 0.075 MG/24HR Place onto the skin. Patient not taking: Reported  on 01/01/2024 07/30/21   [provider]  gabapentin (NEURONTIN) 300 MG capsule Take 300 mg by mouth daily as needed (nerve pain). 11/29/19   [provider]  levalbuterol (XOPENEX HFA) 45 MCG/ACT inhaler INHALE 2 PUFFS INTO THE LUNGS EVERY 4 HOURS AS NEEDED FOR WHEEZING 02/07/23   Parrett, Virgel Bouquet, NP  levalbuterol (XOPENEX) 1.25 MG/3ML nebulizer solution Take 1.25 mg by nebulization every 4 (four) hours as needed for wheezing. 04/25/23   Olalere, Minna Antis, MD  LORazepam (ATIVAN) 2 MG tablet Take 2 mg by mouth in the morning, at noon, and at bedtime. 12/17/19   [provider]  Na Sulfate-K Sulfate-Mg Sulf 17.5-3.13-1.6 GM/177ML SOLN Take 1 kit by mouth once. 05/02/23   [provider]  nitroGLYCERIN (NITROSTAT) 0.4 MG SL tablet Place 1 tablet (0.4 mg total) under the tongue every 5 (five) minutes as needed for chest pain. 10/07/23 01/05/24  Marykay Lex, MD  olmesartan (BENICAR) 20 MG tablet Take 1 tablet (20 mg total) by mouth daily. 04/04/23   Joylene Grapes, NP  rosuvastatin (CRESTOR) 10 MG tablet Take 1 tablet (10 mg total) by mouth daily. 07/23/23 10/21/23  Marykay Lex, MD  valACYclovir (VALTREX) 500 MG tablet Take 500 mg by mouth daily as needed (outbreaks). 11/16/19   [provider]  Vitamin D, Cholecalciferol, 25 MCG (1000 UT) TABS Take 25 mcg by mouth daily. 11/27/22   Jodelle Gross, NP  zolpidem (AMBIEN) 10 MG tablet Take 10 mg by  mouth at bedtime. 03/20/21   [provider]      Allergies    Keflex [cephalexin], Shellfish allergy, Vancomycin, Zoloft [sertraline hcl], Latex, Amitriptyline, Clindamycin/lincomycin, Other, Penicillins, Pentosan polysulfate sodium, Shellfish-derived products, Sulfa antibiotics, Topamax [topiramate], Uribel [meth-hyo-m bl-na phos-ph sal], Uribel [urelle], Lamotrigine, and Sulfamethoxazole-trimethoprim    Review of Systems   Review of Systems  Physical Exam Updated Vital Signs BP (!) 140/73   Pulse  80   Temp 98.3 F (36.8 C) (Oral)   Resp 18   Ht 5' 3.5" (1.613 m)   Wt 66.2 kg   SpO2 95%   BMI 25.45 kg/m  Physical Exam  ED Results / Procedures / Treatments   Labs (all labs ordered are listed, but only abnormal results are displayed) Labs Reviewed  BASIC METABOLIC PANEL - Abnormal; Notable for the following components:      Result Value   Sodium 127 (*)    Chloride 90 (*)    BUN <5 (*)    All other components within normal limits  CBC  ETHANOL  HEPATIC FUNCTION PANEL  LIPASE, BLOOD  URINALYSIS, ROUTINE W REFLEX MICROSCOPIC  TROPONIN I (HIGH SENSITIVITY)    EKG EKG Interpretation Date/Time:  Wednesday January 14 2024 18:54:19 EDT Ventricular Rate:  80 PR Interval:  136 QRS Duration:  80 QT Interval:  376 QTC Calculation: 433 R Axis:   57  Text Interpretation: Normal sinus rhythm Normal ECG When compared with ECG of 23-Jul-2023 11:30, PREVIOUS ECG IS PRESENT Since last tracing rate slower Confirmed by Jacalyn Lefevre [phone]) on 01/14/2024 7:40:36 PM  Radiology No results found.  Procedures Procedures  {Document cardiac monitor, telemetry assessment procedure when appropriate:1}  Medications Ordered in ED Medications  sodium chloride 0.9 % bolus 1,000 mL (has no administration in time range)  famotidine (PEPCID) IVPB 20 mg premix (has no administration in time range)  LORazepam (ATIVAN) injection 1 mg (has no administration in time range)    ED Course/ Medical Decision Making/ A&P   {   Click here for ABCD2, HEART and other calculatorsREFRESH Note before signing :1}                              Medical Decision Making Amount and/or Complexity of Data Reviewed Labs: ordered. Radiology: ordered.  Risk Prescription drug management.   ***  {Document critical care time when appropriate:1} {Document review of labs and clinical decision tools ie heart score, Chads2Vasc2 etc:1}  {Document your independent review of radiology images, and any outside  records:1} {Document your discussion with family members, caretakers, and with consultants:1} {Document social determinants of health affecting pt's care:1} {Document your decision making why or why not admission, treatments were needed:1} Final Clinical Impression(s) / ED Diagnoses Final diagnoses:  None    Rx / DC Orders ED Discharge Orders     None

## 2024-01-14 NOTE — ED Triage Notes (Addendum)
 Pt arrived via [facility] from home for c/o left sided chest pain that radiates to left shoulder and axilla and epigastric pain that started at 0730 today upon waking. Per EMS, ETOH on board. Pt drank 4 shots of vodka today. Pt states stopped drinking ETOH when EMS, got there. Pt took ASA 324 mg and when she called 911 about her chest pain they told her to take ASA and she took another 324mg  of ASA for a total of 684mg . Pt told EMS she also wants to get detox from ETOH. Pt denies N/V, SOB. EMS gave nitroglycerin 0.4mg 

## 2024-01-15 ENCOUNTER — Telehealth: Payer: Self-pay | Admitting: *Deleted

## 2024-01-15 NOTE — Telephone Encounter (Signed)
 Pharmacy called related to Rx: Librium as pt is on different depression Rx...EDCM reviewed chart to find "Etoh abuse: pt wants to stop. She is given a rx for librium and is given resources for follow up."  Advised to fill as written.

## 2024-01-19 ENCOUNTER — Telehealth: Payer: Self-pay | Admitting: Cardiology

## 2024-01-19 NOTE — Telephone Encounter (Signed)
 Follow Up:      Patient says she would last lab results please. She says if she is not there, please leave the results.

## 2024-01-19 NOTE — Telephone Encounter (Signed)
 Pt reports that she did not start taking the Crestor due to going through a lot of stress with her and her boyfriend. She is not focused on her cholesterol at this time, she is more focused on detoxing. She states that she is an alcoholic- she went to the ER on 01/14/24   She wanted someone to talk with her about her lab work from where she was in the hospital on 01/14/24. She is worried about her kidney and liver function. Informed her that this call is in regards to the lipid panel.  She said she needs to get into a detox program and was asking me questions about that. I gave her the [facility] number [phone] to call and see if she can get some guidance.

## 2024-01-20 NOTE — Telephone Encounter (Signed)
Recommend discussing with PCP. 

## 2024-01-21 ENCOUNTER — Other Ambulatory Visit: Payer: Self-pay | Admitting: Family Medicine

## 2024-01-21 DIAGNOSIS — J449 Chronic obstructive pulmonary disease, unspecified: Secondary | ICD-10-CM | POA: Diagnosis not present

## 2024-01-21 DIAGNOSIS — G4736 Sleep related hypoventilation in conditions classified elsewhere: Secondary | ICD-10-CM | POA: Diagnosis not present

## 2024-01-28 ENCOUNTER — Telehealth: Payer: Self-pay | Admitting: Acute Care

## 2024-01-28 NOTE — Telephone Encounter (Signed)
 Patient left VM on lung screening line to ask to review her appts with Dr. Wynona Neat and T. Parrett, NP.  Returned called but line gave busy signal

## 2024-02-03 DIAGNOSIS — F10120 Alcohol abuse with intoxication, uncomplicated: Secondary | ICD-10-CM | POA: Diagnosis not present

## 2024-02-03 DIAGNOSIS — F17210 Nicotine dependence, cigarettes, uncomplicated: Secondary | ICD-10-CM | POA: Diagnosis not present

## 2024-02-03 DIAGNOSIS — E871 Hypo-osmolality and hyponatremia: Secondary | ICD-10-CM | POA: Diagnosis not present

## 2024-02-03 DIAGNOSIS — Z9981 Dependence on supplemental oxygen: Secondary | ICD-10-CM | POA: Diagnosis not present

## 2024-02-03 DIAGNOSIS — R1012 Left upper quadrant pain: Secondary | ICD-10-CM | POA: Diagnosis not present

## 2024-02-03 DIAGNOSIS — F1010 Alcohol abuse, uncomplicated: Secondary | ICD-10-CM | POA: Diagnosis not present

## 2024-02-03 DIAGNOSIS — R1032 Left lower quadrant pain: Secondary | ICD-10-CM | POA: Diagnosis not present

## 2024-02-03 DIAGNOSIS — Y906 Blood alcohol level of 120-199 mg/100 ml: Secondary | ICD-10-CM | POA: Diagnosis not present

## 2024-02-04 ENCOUNTER — Emergency Department (HOSPITAL_COMMUNITY)

## 2024-02-04 ENCOUNTER — Encounter (HOSPITAL_COMMUNITY): Payer: Self-pay

## 2024-02-04 ENCOUNTER — Other Ambulatory Visit: Payer: Self-pay

## 2024-02-04 ENCOUNTER — Emergency Department (HOSPITAL_COMMUNITY)
Admission: EM | Admit: 2024-02-04 | Discharge: 2024-02-05 | Disposition: A | Discharge status: Home / Self Care | Attending: Emergency Medicine | Admitting: Emergency Medicine

## 2024-02-04 DIAGNOSIS — I10 Essential (primary) hypertension: Secondary | ICD-10-CM | POA: Diagnosis not present

## 2024-02-04 DIAGNOSIS — I200 Unstable angina: Secondary | ICD-10-CM | POA: Diagnosis not present

## 2024-02-04 DIAGNOSIS — J45909 Unspecified asthma, uncomplicated: Secondary | ICD-10-CM | POA: Diagnosis not present

## 2024-02-04 DIAGNOSIS — J449 Chronic obstructive pulmonary disease, unspecified: Secondary | ICD-10-CM | POA: Diagnosis not present

## 2024-02-04 DIAGNOSIS — Z79899 Other long term (current) drug therapy: Secondary | ICD-10-CM | POA: Diagnosis not present

## 2024-02-04 DIAGNOSIS — R079 Chest pain, unspecified: Secondary | ICD-10-CM | POA: Diagnosis not present

## 2024-02-04 DIAGNOSIS — Z7982 Long term (current) use of aspirin: Secondary | ICD-10-CM | POA: Diagnosis not present

## 2024-02-04 DIAGNOSIS — R457 State of emotional shock and stress, unspecified: Secondary | ICD-10-CM | POA: Diagnosis not present

## 2024-02-04 DIAGNOSIS — R1013 Epigastric pain: Secondary | ICD-10-CM | POA: Diagnosis not present

## 2024-02-04 DIAGNOSIS — I201 Angina pectoris with documented spasm: Secondary | ICD-10-CM | POA: Diagnosis not present

## 2024-02-04 DIAGNOSIS — R001 Bradycardia, unspecified: Secondary | ICD-10-CM | POA: Diagnosis not present

## 2024-02-04 DIAGNOSIS — F10929 Alcohol use, unspecified with intoxication, unspecified: Secondary | ICD-10-CM | POA: Diagnosis not present

## 2024-02-04 DIAGNOSIS — Z91040 Latex allergy status: Secondary | ICD-10-CM | POA: Diagnosis not present

## 2024-02-04 LAB — COMPREHENSIVE METABOLIC PANEL WITH GFR
ALT: 41 U/L (ref 0–44)
AST: 39 U/L (ref 15–41)
Albumin: 3.7 g/dL (ref 3.5–5.0)
Alkaline Phosphatase: 47 U/L (ref 38–126)
Anion gap: 10 (ref 5–15)
BUN: <5 mg/dL — ABNORMAL LOW (ref 8–23)
CO2: 22 mmol/L (ref 22–32)
Calcium: 8.7 mg/dL — ABNORMAL LOW (ref 8.9–10.3)
Chloride: 94 mmol/L — ABNORMAL LOW (ref 98–111)
Creatinine, Ser: 0.52 mg/dL (ref 0.44–1.00)
GFR, Estimated: >60 mL/min (ref >60)
Glucose, Bld: 99 mg/dL (ref 70–99)
Potassium: 3.5 mmol/L (ref 3.5–5.1)
Sodium: 126 mmol/L — ABNORMAL LOW (ref 135–145)
Total Bilirubin: 0.4 mg/dL (ref 0.0–1.2)
Total Protein: 5.8 g/dL — ABNORMAL LOW (ref 6.5–8.1)

## 2024-02-04 LAB — CBC WITH DIFFERENTIAL/PLATELET
Abs Immature Granulocytes: 0.03 10*3/uL (ref 0.00–0.07)
Basophils Absolute: 0.1 10*3/uL (ref 0.0–0.1)
Basophils Relative: 1 %
Eosinophils Absolute: 0.1 10*3/uL (ref 0.0–0.5)
Eosinophils Relative: 1 %
HCT: 34.1 % — ABNORMAL LOW (ref 36.0–46.0)
Hemoglobin: 11.8 g/dL — ABNORMAL LOW (ref 12.0–15.0)
Immature Granulocytes: 0 %
Lymphocytes Relative: 38 %
Lymphs Abs: 3.1 10*3/uL (ref 0.7–4.0)
MCH: 32.7 pg (ref 26.0–34.0)
MCHC: 34.6 g/dL (ref 30.0–36.0)
MCV: 94.5 fL (ref 80.0–100.0)
Monocytes Absolute: 0.7 10*3/uL (ref 0.1–1.0)
Monocytes Relative: 8 %
Neutro Abs: 4.3 10*3/uL (ref 1.7–7.7)
Neutrophils Relative %: 52 %
Platelets: 183 10*3/uL (ref 150–400)
RBC: 3.61 MIL/uL — ABNORMAL LOW (ref 3.87–5.11)
RDW: 12.8 % (ref 11.5–15.5)
WBC: 8.2 10*3/uL (ref 4.0–10.5)
nRBC: 0 % (ref 0.0–0.2)

## 2024-02-04 LAB — TROPONIN I (HIGH SENSITIVITY): Troponin I (High Sensitivity): 4 ng/L (ref <18)

## 2024-02-04 LAB — ETHANOL: Alcohol, Ethyl (B): <10 mg/dL (ref <10)

## 2024-02-04 LAB — LIPASE, BLOOD: Lipase: 24 U/L (ref 11–51)

## 2024-02-04 LAB — PROTIME-INR
INR: 1 (ref 0.8–1.2)
Prothrombin Time: 13.7 s (ref 11.4–15.2)

## 2024-02-04 MED ORDER — SODIUM CHLORIDE 0.9 % IV BOLUS
1000.0000 mL | Freq: Once | INTRAVENOUS | Status: AC
  Administered 2024-02-04: 1000 mL via INTRAVENOUS

## 2024-02-04 NOTE — ED Provider Notes (Signed)
 [place] EMERGENCY DEPARTMENT AT [facility] Provider Note   CSN: [id number] Arrival date & time: 02/04/24  1948     History {Add pertinent medical, surgical, social history, OB history to HPI:1} Chief Complaint  Patient presents with   Hypertension    Solstice M Baisley is a 63 y.o. female with PMH as listed below who presents via [facility] from home with a c/o of hypertension in the 170s-200 systolic at home. Patient treated herself with three nitroglycerin  and 325 ASA and then called EMS and felt improved. Had recurrent HTN and took another nitroglycerin  and called EMS again. She complains of mild chest and upper epigastric pain. Pain resolved and is back. PT did state that she had vodka today and has a hx of ETOH dependence. She has no pain now. No urinary sxs, N/V/D.   Past Medical History:  Diagnosis Date   Allergy     Angio-edema    Anxiety    Arthritis    Asthma    Clostridioides difficile infection    Complication of anesthesia    takes alot to sedated   COPD (chronic obstructive pulmonary disease) (HCC)    Depression    Emphysema of lung (HCC)    Fibromyalgia    GERD (gastroesophageal reflux disease)    Hepatitis C    (Dr. Andriette Keeling) Treated with Harvoni March-May 2016   Herpes simplex    Hyperlipemia    Hypertension    Insomnia    Mental disorder    Neuropathy    Osteoporosis    Oxygen  deficiency    PONV (postoperative nausea and vomiting)    Sleep apnea    Substance abuse (HCC)    Urticaria        Home Medications Prior to Admission medications   Medication Sig Start Date End Date Taking? Authorizing Provider  Ascorbic Acid  (VITAMIN C ) 1000 MG tablet Take 1,000 mg by mouth daily.    [provider]  aspirin  EC 81 MG tablet Take 1 tablet (81 mg total) by mouth daily. 08/04/18   Arleen Lacer, MD  Budeson-Glycopyrrol-Formoterol  (BREZTRI  AEROSPHERE) 160-9-4.8 MCG/ACT AERO Inhale 2 puffs into the lungs in the morning and at bedtime.  Alternates between symbicort  and Breztri  04/25/23   Olalere, Adewale A, MD  budeson-glycopyrrolate-formoterol  (BREZTRI  AEROSPHERE) 160-9-4.8 MCG/ACT AERO Inhale 2 puffs into the lungs in the morning and at bedtime. 01/02/24   Parrett, Macdonald Savoy, NP  cetirizine  (ZYRTEC ) 10 MG tablet Take 1 tablet (10 mg total) by mouth daily. 09/09/22   Ardie Kras, FNP  chlordiazePOXIDE  (LIBRIUM ) 25 MG capsule 50mg  PO TID x 1D, then 25-50mg  PO BID X 1D, then 25-50mg  PO QD X 1D 01/14/24   Haviland, Julie, MD  cilostazol  (PLETAL ) 50 MG tablet Take 1 tablet (50 mg total) by mouth 2 (two) times daily. Patient not taking: Reported on 01/01/2024 10/07/23   Avanell Leigh, MD  cyclobenzaprine  (FLEXERIL ) 10 MG tablet Take 10 mg by mouth 3 (three) times daily as needed for muscle spasms.    [provider]  diazepam  (VALIUM ) 5 MG tablet Take 1 tablet (5 mg total) by mouth 2 (two) times daily. Patient not taking: Reported on 01/01/2024 11/08/22   Onetha Bile, MD  DULoxetine  (CYMBALTA ) 30 MG capsule Take 30 mg by mouth daily. Patient not taking: Reported on 01/01/2024 07/31/18   [provider]  EPINEPHrine  (AUVI-Q ) 0.1 MG/0.1ML SOAJ Inject into the muscle. 08/18/18   [provider]  estradiol  (VIVELLE -DOT) 0.075 MG/24HR Place onto  the skin. Patient not taking: Reported on 01/01/2024 07/30/21   [provider]  gabapentin  (NEURONTIN ) 300 MG capsule Take 300 mg by mouth daily as needed (nerve pain). 11/29/19   [provider]  levalbuterol  (XOPENEX  HFA) 45 MCG/ACT inhaler INHALE 2 PUFFS INTO THE LUNGS EVERY 4 HOURS AS NEEDED FOR WHEEZING 02/07/23   Parrett, Tammy S, NP  levalbuterol  (XOPENEX ) 1.25 MG/3ML nebulizer solution Take 1.25 mg by nebulization every 4 (four) hours as needed for wheezing. 04/25/23   Olalere, Ona Bidding A, MD  LORazepam  (ATIVAN ) 2 MG tablet Take 2 mg by mouth in the morning, at noon, and at bedtime. 12/17/19   [provider]  Na Sulfate-K Sulfate-Mg Sulf  17.5-3.13-1.6 GM/177ML SOLN Take 1 kit by mouth once. 05/02/23   [provider]  nitroGLYCERIN  (NITROSTAT ) 0.4 MG SL tablet Place 1 tablet (0.4 mg total) under the tongue every 5 (five) minutes as needed for chest pain. 10/07/23 01/05/24  Arleen Lacer, MD  olmesartan  (BENICAR ) 20 MG tablet Take 1 tablet (20 mg total) by mouth daily. 04/04/23   Jude Norton, NP  rosuvastatin  (CRESTOR ) 10 MG tablet Take 1 tablet (10 mg total) by mouth daily. 07/23/23 10/21/23  Arleen Lacer, MD  valACYclovir  (VALTREX ) 500 MG tablet Take 500 mg by mouth daily as needed (outbreaks). 11/16/19   [provider]  Vitamin D , Cholecalciferol , 25 MCG (1000 UT) TABS Take 25 mcg by mouth daily. 11/27/22   Tania Familia, NP  zolpidem  (AMBIEN ) 10 MG tablet Take 10 mg by mouth at bedtime. 03/20/21   [provider]      Allergies    Keflex  [cephalexin ], Shellfish allergy , Vancomycin , Zoloft  [sertraline  hcl], Latex, Amitriptyline, Clindamycin/lincomycin, Other, Penicillins, Pentosan polysulfate sodium, Shellfish-derived products, Sulfa antibiotics, Topamax [topiramate], Uribel [meth-hyo-m bl-na phos-ph sal], Uribel [urelle], Lamotrigine, and Sulfamethoxazole-trimethoprim    Review of Systems   Review of Systems A 10 point review of systems was performed and is negative unless otherwise reported in HPI.  Physical Exam Updated Vital Signs BP (!) 161/77   Pulse 75   Temp 98.3 F (36.8 C) (Oral)   Resp 14   Ht 5' 3.5" (1.613 m)   Wt 66.2 kg   SpO2 99%   BMI 25.45 kg/m  Physical Exam General: Normal appearing female, lying in bed.  HEENT: PERRLA, Sclera anicteric, MMM, trachea midline.  Cardiology: RRR, no murmurs/rubs/gallops. BL radial and DP pulses equal bilaterally.  Resp: Normal respiratory rate and effort. CTAB, no wheezes, rhonchi, crackles.  Abd: Soft, non-tender, non-distended. No rebound tenderness or guarding.  GU: Deferred. MSK: No peripheral edema or signs of trauma.  Extremities without deformity or TTP. No cyanosis or clubbing. Skin: warm, dry. No rashes or lesions. Back: No CVA tenderness Neuro: A&Ox4, CNs II-XII grossly intact. MAEs. Sensation grossly intact.  Psych: Normal mood and affect.   ED Results / Procedures / Treatments   Labs (all labs ordered are listed, but only abnormal results are displayed) Labs Reviewed  CBC WITH DIFFERENTIAL/PLATELET - Abnormal; Notable for the following components:      Result Value   RBC 3.61 (*)    Hemoglobin 11.8 (*)    HCT 34.1 (*)    All other components within normal limits  COMPREHENSIVE METABOLIC PANEL WITH GFR - Abnormal; Notable for the following components:   Sodium 126 (*)    Chloride 94 (*)    BUN <5 (*)    Calcium  8.7 (*)    Total Protein 5.8 (*)  All other components within normal limits  LIPASE, BLOOD  ETHANOL  PROTIME-INR  URINALYSIS, ROUTINE W REFLEX MICROSCOPIC  TROPONIN I (HIGH SENSITIVITY)  TROPONIN I (HIGH SENSITIVITY)    EKG EKG Interpretation Date/Time:  Wednesday February 04 2024 20:15:56 EDT Ventricular Rate:  77 PR Interval:  145 QRS Duration:  89 QT Interval:  389 QTC Calculation: 441 R Axis:   127  Text Interpretation: Right and left arm electrode reversal, interpretation assumes no reversal Sinus rhythm Right axis deviation Confirmed by Annita Kindle [phone]) on 02/04/2024 8:44:01 PM  Radiology DG Chest Portable 1 View Result Date: 02/04/2024 CLINICAL DATA:  Chest pain EXAM: PORTABLE CHEST 1 VIEW COMPARISON:  01/14/2024 FINDINGS: Cardiac shadow is stable. Lungs are well aerated bilaterally. No focal infiltrate or effusion is seen. No bony abnormality is noted. IMPRESSION: No active disease. Electronically Signed   By: Violeta Grey M.D.   On: 02/04/2024 23:38    Procedures Procedures  {Document cardiac monitor, telemetry assessment procedure when appropriate:1}  Medications Ordered in ED Medications  sodium chloride  0.9 % bolus 1,000 mL (1,000 mLs Intravenous New  Bag/Given 02/04/24 2209)    ED Course/ Medical Decision Making/ A&P                          Medical Decision Making Amount and/or Complexity of Data Reviewed Labs: ordered. Decision-making details documented in ED Course. Radiology: ordered.    This patient presents to the ED for concern of ***, this involves an extensive number of treatment options, and is a complaint that carries with it a high risk of complications and morbidity.  I considered the following differential and admission for this acute, potentially life threatening condition.   MDM:    Patient is overall well appearing at this time. Blood pressure is *** mmHg.   Patient does not have any symptoms on history nor signs on physical exam concerning for end organ damage secondary to hypertension.   Specifically, based up the patient's presentation, the patient is at sufficiently low risk for: -ACS given no CP, no SOB, normal cardio-pulmonary exam -SAH/stroke given no hx of acute headache/stroke like sxs and normal neurologic exam.  -end organ renal disease given no *** hematuria  Labs demonstrate ***. EKG ***. Patient is given *** for their blood pressure.  Clinical Course as of 02/04/24 2342  Wed Feb 04, 2024  2340 Sodium(!): 126 Hyponatremia, likely d/t daily EtOH intake [HN]    Clinical Course User Index [HN] Merdis Stalling, MD    Labs: I Ordered, and personally interpreted labs.  The pertinent results include:  those listed above  Imaging Studies ordered: I ordered imaging studies including CXR I independently visualized and interpreted imaging. I agree with the radiologist interpretation  Additional history obtained from chart review.   Cardiac Monitoring: The patient was maintained on a cardiac monitor.  I personally viewed and interpreted the cardiac monitored which showed an underlying rhythm of: NSR  Reevaluation: After the interventions noted above, I reevaluated the patient and found that they  have :improved  Social Determinants of Health: Lives independently  Disposition:  ***  Co morbidities that complicate the patient evaluation  Past Medical History:  Diagnosis Date   Allergy     Angio-edema    Anxiety    Arthritis    Asthma    Clostridioides difficile infection    Complication of anesthesia    takes alot to sedated   COPD (chronic obstructive pulmonary disease) (  HCC)    Depression    Emphysema of lung (HCC)    Fibromyalgia    GERD (gastroesophageal reflux disease)    Hepatitis C    (Dr. Andriette Keeling) Treated with Harvoni March-May 2016   Herpes simplex    Hyperlipemia    Hypertension    Insomnia    Mental disorder    Neuropathy    Osteoporosis    Oxygen  deficiency    PONV (postoperative nausea and vomiting)    Sleep apnea    Substance abuse (HCC)    Urticaria      Medicines Meds ordered this encounter  Medications   sodium chloride  0.9 % bolus 1,000 mL    I have reviewed the patients home medicines and have made adjustments as needed  Problem List / ED Course: Problem List Items Addressed This Visit   None        {Document critical care time when appropriate:1} {Document review of labs and clinical decision tools ie heart score, Chads2Vasc2 etc:1}  {Document your independent review of radiology images, and any outside records:1} {Document your discussion with family members, caretakers, and with consultants:1} {Document social determinants of health affecting pt's care:1} {Document your decision making why or why not admission, treatments were needed:1}  This note was created using dictation software, which may contain spelling or grammatical errors.

## 2024-02-04 NOTE — ED Notes (Signed)
 Called and placed PT on monitor with CCMD.

## 2024-02-04 NOTE — ED Triage Notes (Signed)
 PT arrived via [facility] from home with a c/o of upper epigastric pain.PT took 4 nitroglycerin and 324 ASA.Pain resolved and is back.PT did state that she had vodka today and has a hx of ETOH abuse.

## 2024-02-05 DIAGNOSIS — J449 Chronic obstructive pulmonary disease, unspecified: Secondary | ICD-10-CM | POA: Diagnosis not present

## 2024-02-05 DIAGNOSIS — K219 Gastro-esophageal reflux disease without esophagitis: Secondary | ICD-10-CM | POA: Diagnosis not present

## 2024-02-05 DIAGNOSIS — I2510 Atherosclerotic heart disease of native coronary artery without angina pectoris: Secondary | ICD-10-CM | POA: Diagnosis not present

## 2024-02-05 DIAGNOSIS — I10 Essential (primary) hypertension: Secondary | ICD-10-CM | POA: Diagnosis not present

## 2024-02-05 LAB — URINALYSIS, ROUTINE W REFLEX MICROSCOPIC
Bilirubin Urine: NEGATIVE
Glucose, UA: NEGATIVE mg/dL
Hgb urine dipstick: NEGATIVE
Ketones, ur: 5 mg/dL — AB
Leukocytes,Ua: NEGATIVE
Nitrite: NEGATIVE
Protein, ur: NEGATIVE mg/dL
Specific Gravity, Urine: 1.002 — ABNORMAL LOW (ref 1.005–1.030)
pH: 6 (ref 5.0–8.0)

## 2024-02-05 LAB — TROPONIN I (HIGH SENSITIVITY): Troponin I (High Sensitivity): 3 ng/L (ref <18)

## 2024-02-09 ENCOUNTER — Telehealth: Payer: Self-pay | Admitting: Cardiology
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
## 2024-02-09 NOTE — Telephone Encounter (Signed)
 Called patient and patient verbalized blood pressure has been elevated. Patient reports blood pressure 162/83.Reports has new PCP next week  appt on 5/2 and blood work on 4/25. Denies dizziness, blurred vision, headache reports being weak. Reports drinking pedialyte and electrolyte drinks as advise bu PCP. Advise patient to continue to keep scheduled appt with Cardiologist and PCP. Advise patient to continue to keep log of blood pressure and bring  and call office to next scheduled visit with cardiologist and PCP. Advise patient to manage stress level. Advise patient if symptoms resume, dizziness, lightheadedness, blurred vision mor elevated blood pressure to call 911 or got to emergency room.  Made patient aware that Dr. Addie Holstein will be made aware. Patient verbalized an understanding.

## 2024-02-10 ENCOUNTER — Ambulatory Visit: Admitting: Adult Health

## 2024-02-10 ENCOUNTER — Encounter: Payer: Self-pay | Admitting: Adult Health

## 2024-02-10 VITALS — BP 150/80 | HR 96 | Ht 63.5 in | Wt 146.4 lb

## 2024-02-10 DIAGNOSIS — Z720 Tobacco use: Secondary | ICD-10-CM | POA: Diagnosis not present

## 2024-02-10 DIAGNOSIS — R0683 Snoring: Secondary | ICD-10-CM

## 2024-02-10 DIAGNOSIS — J4489 Other specified chronic obstructive pulmonary disease: Secondary | ICD-10-CM

## 2024-02-10 DIAGNOSIS — Z23 Encounter for immunization: Secondary | ICD-10-CM

## 2024-02-10 DIAGNOSIS — S61419A Laceration without foreign body of unspecified hand, initial encounter: Secondary | ICD-10-CM | POA: Insufficient documentation

## 2024-02-10 DIAGNOSIS — S61411A Laceration without foreign body of right hand, initial encounter: Secondary | ICD-10-CM | POA: Diagnosis not present

## 2024-02-10 DIAGNOSIS — F1029 Alcohol dependence with unspecified alcohol-induced disorder: Secondary | ICD-10-CM

## 2024-02-10 DIAGNOSIS — J9611 Chronic respiratory failure with hypoxia: Secondary | ICD-10-CM

## 2024-02-10 NOTE — Patient Instructions (Addendum)
 Continue on Breztri  2 puffs Twice daily  , rinse after use.  Xopenex   inhaler As needed   Continue Oxygen  2l/m At bedtime   Activity as tolerated Work on not smoking  TDAP today  Wash cut on the hand well with soap and water , pat dry , cover until healed. Call back if develop redness or drainage.  Set up for in lab sleep study .  Follow up in 6 months with  Dr. Magda Schneider and As needed   Please contact office for sooner follow up if symptoms do not improve or worsen or seek emergency care

## 2024-02-10 NOTE — Telephone Encounter (Signed)
 Agree with PCP f/u  [facility]

## 2024-02-10 NOTE — Assessment & Plan Note (Signed)
 COPD with asthma and emphysema.  Patient is encouraged on smoking cessation.  Continue on Breztri  twice daily.  Recent chest x-ray was clear.  LDCT chest results are pending from last month.  Plan  Patient Instructions  Continue on Breztri  2 puffs Twice daily  , rinse after use.  Xopenex   inhaler As needed   Continue Oxygen  2l/m At bedtime   Activity as tolerated Work on not smoking  TDAP today  Wash cut on the hand well with soap and water , pat dry , cover until healed. Call back if develop redness or drainage.  Set up for in lab sleep study .  Follow up in 6 months with  Dr. Magda Schneider and As needed   Please contact office for sooner follow up if symptoms do not improve or worsen or seek emergency care  '

## 2024-02-10 NOTE — Assessment & Plan Note (Signed)
Continue on oxygen 2 L at bedtime 

## 2024-02-10 NOTE — Assessment & Plan Note (Signed)
 Snoring, restless sleep, nocturnal hypoxemia are all suspicious for underlying sleep apnea.  Will set patient up for an in lab sleep study.  Patient is on oxygen  will need in-lab sleep study

## 2024-02-10 NOTE — Assessment & Plan Note (Signed)
 Alcohol cessation discussed in detail.

## 2024-02-10 NOTE — Assessment & Plan Note (Signed)
 Small right index finger laceration appears to be healing well.  No significant redness or drainage.  Continue with wound care.  Report any signs of infection such as redness, fever, drainage.  Continue to clean daily and cover with bandage. Tdap today.

## 2024-02-10 NOTE — Progress Notes (Signed)
 @Patient  ID: Sarah Reeves, female    DOB: 02-04-1961, 63 y.o.   MRN: [id number]  Chief Complaint  Patient presents with   Follow-up    Referring provider: Thurman Flores, MD  HPI: 63 year old female active smoker followed for COPD with asthma and emphysema, chronic respiratory failure on nocturnal oxygen  at 2 L  TEST/EVENTS :  home sleep study that was done on December 03, 2022, no significant sleep apnea with AHI at 3.4/hour and SpO2 low at 77%.  Average O2 saturations were 84%    Spirometry test in November 2023 showed minimal airflow obstruction.    LDCT chest January 02, 2023 shows no suspicious pulmonary nodules.  Mild bronchial wall thickening with mild emphysema.  Scattered areas of scarring in the lung bases.   12/2022 ONO shows nocturnal hypoxemia , O2 sats <88%- 6hr , avg 84%, low sats 77% . -started on O2 2l/m At bedtime     Coronary CT chest January 17, 2023 showed coronary calcium  score at 0, noncalcified plaque in ostium of the large D1 branch causing moderate stenosis 50 to 69% minimal obstruction in the LAD and RCA, CT FFR suggest nonobstructive coronary disease, LAD: CTFFR 0.90 across lesion in ostial D1, suggesting lesion is not functionally significant   02/2023 2D echo shows pump function of your heart is normal grade 1 diastolic dysfunction -this is mild stiffness of the heart, right-sided your heart function and size appears normal.  No valvular stenosis is noted   02/10/2024 Follow up : COPD with Asthma, Emphysema and O2 RF  Patient returns for a 29-month follow-up.  Patient is followed for COPD with asthma and emphysema.  She remains on Breztri  twice daily.  Patient continues to smoke.  We discussed smoking cessation.  She participates in the lung cancer CT screening program.  Patient had her most recent CT chest January 06, 2024 results are pending.  Chest x-ray January 14, 2024 showed clear lungs. She remains on oxygen  2 L at bedtime. Previous home sleep study was  negative for sleep apnea. Split night ordered but denied by insurance.  Still snoring with restless sleep and daytime sleepiness . Has gasping for air despite Oxygen . Feels she has sleep apnea. Feels tired all day long, has fatigue and sleepiness. Wants a repeat sleep study .   Cut hand 2 days ago opening drink. Wants to get Tetanus shot. Last TDAP 2019 No significant bleeding or redness.   Under stress with recent break up. Started back drinking liquor. Discussed cutting back and cessation.  Was seen in the emergency room last week and last month for chest,  epigastric and abdominal pain.  Was recommended to stop drinking alcohol.  Chest x-ray was clear.  Allergies  Allergen Reactions   Keflex  [Cephalexin ] Hives, Itching and Other (See Comments)    Tongue and throat itching/tingling   Shellfish Allergy  Other (See Comments)    Unknown reaction; "MD told me I was allergic"   Vancomycin  Anaphylaxis    Face and tongue swells   Zoloft  [Sertraline  Hcl] Other (See Comments)    REACTION: Nightmares, Grind teeth really bad   Latex Itching    Itch and blisters from contact   Amitriptyline     Hot, itching, tongue tingling, breakthrough bleeding.   Clindamycin/Lincomycin Hives and Other (See Comments)    Stated that this causes her to get really horse and made her feel really hot   Other Other (See Comments)   Penicillins    Pentosan Polysulfate Sodium Swelling  Throat swelling   Shellfish-Derived Products Other (See Comments)   Sulfa Antibiotics Other (See Comments)   Topamax [Topiramate]     Says it makes her "constipation and she can't urinate normally"   Uribel [Meth-Hyo-M Bl-Na Phos-Ph Sal] Nausea Only   Uribel [Urelle] Swelling and Other (See Comments)    Patient stated this makes her stomach swell and makes her feel si   Lamotrigine Rash   Sulfamethoxazole-Trimethoprim Rash    Patient developed feelings of dizziness, rash and itchy hands.  Patient then vomited and had diarrhea.     Immunization History  Administered Date(s) Administered   Influenza, Seasonal, Injecte, Preservative Fre 08/13/2023   Influenza,inj,Quad PF,6+ Mos 06/27/2018, 07/07/2019   Pneumococcal Polysaccharide-23 06/27/2018   Td 10/21/1998   Td (Adult),5 Lf Tetanus Toxid, Preservative Free 10/21/1998   Tdap 02/10/2024   Unspecified SARS-COV-2 Vaccination 01/10/2020, 02/07/2020   Zoster Recombinant(Shingrix) 07/14/2019, 09/24/2019    Past Medical History:  Diagnosis Date   Allergy     Angio-edema    Anxiety    Arthritis    Asthma    Clostridioides difficile infection    Complication of anesthesia    takes alot to sedated   COPD (chronic obstructive pulmonary disease) (HCC)    Depression    Emphysema of lung (HCC)    Fibromyalgia    GERD (gastroesophageal reflux disease)    Hepatitis C    (Dr. Andriette Keeling) Treated with Harvoni March-May 2016   Herpes simplex    Hyperlipemia    Hypertension    Insomnia    Mental disorder    Neuropathy    Osteoporosis    Oxygen  deficiency    PONV (postoperative nausea and vomiting)    Sleep apnea    Substance abuse (HCC)    Urticaria     Tobacco History: Social History   Tobacco Use  Smoking Status Every Day   Current packs/day: 1.00   Average packs/day: 1 pack/day for 47.0 years (47.0 ttl pk-yrs)   Types: Cigarettes   Passive exposure: Current  Smokeless Tobacco Never  Tobacco Comments   Had a bad break up with boyfriend, she has been smoking and drinking.  10 cigarettes/day.  02/10/2024 hfb   Ready to quit: Not Answered Counseling given: Not Answered Tobacco comments: Had a bad break up with boyfriend, she has been smoking and drinking.  10 cigarettes/day.  02/10/2024 hfb   Outpatient Medications Prior to Visit  Medication Sig Dispense Refill   Ascorbic Acid  (VITAMIN C ) 1000 MG tablet Take 1,000 mg by mouth daily.     aspirin  EC 81 MG tablet Take 1 tablet (81 mg total) by mouth daily. 90 tablet 3   Budeson-Glycopyrrol-Formoterol   (BREZTRI  AEROSPHERE) 160-9-4.8 MCG/ACT AERO Inhale 2 puffs into the lungs in the morning and at bedtime. Alternates between symbicort  and Breztri  10.7 g 6   budeson-glycopyrrolate-formoterol  (BREZTRI  AEROSPHERE) 160-9-4.8 MCG/ACT AERO Inhale 2 puffs into the lungs in the morning and at bedtime. 10.7 g 6   cetirizine  (ZYRTEC ) 10 MG tablet Take 1 tablet (10 mg total) by mouth daily. 30 tablet 5   cilostazol  (PLETAL ) 50 MG tablet Take 1 tablet (50 mg total) by mouth 2 (two) times daily. 180 tablet 3   cyclobenzaprine  (FLEXERIL ) 10 MG tablet Take 10 mg by mouth 3 (three) times daily as needed for muscle spasms.     DULoxetine  (CYMBALTA ) 30 MG capsule Take 30 mg by mouth daily.     EPINEPHrine  (AUVI-Q ) 0.1 MG/0.1ML SOAJ Inject into the muscle.  gabapentin  (NEURONTIN ) 300 MG capsule Take 300 mg by mouth daily as needed (nerve pain).     levalbuterol  (XOPENEX  HFA) 45 MCG/ACT inhaler INHALE 2 PUFFS INTO THE LUNGS EVERY 4 HOURS AS NEEDED FOR WHEEZING 15 g 1   levalbuterol  (XOPENEX ) 1.25 MG/3ML nebulizer solution Take 1.25 mg by nebulization every 4 (four) hours as needed for wheezing. 72 mL 5   LORazepam  (ATIVAN ) 2 MG tablet Take 2 mg by mouth in the morning, at noon, and at bedtime.     Na Sulfate-K Sulfate-Mg Sulf 17.5-3.13-1.6 GM/177ML SOLN Take 1 kit by mouth once.     olmesartan  (BENICAR ) 20 MG tablet Take 1 tablet (20 mg total) by mouth daily. 90 tablet 3   valACYclovir  (VALTREX ) 500 MG tablet Take 500 mg by mouth daily as needed (outbreaks).     Vitamin D , Cholecalciferol , 25 MCG (1000 UT) TABS Take 25 mcg by mouth daily. 60 tablet 3   zolpidem  (AMBIEN ) 10 MG tablet Take 10 mg by mouth at bedtime.     nitroGLYCERIN  (NITROSTAT ) 0.4 MG SL tablet Place 1 tablet (0.4 mg total) under the tongue every 5 (five) minutes as needed for chest pain. 25 tablet 11   rosuvastatin  (CRESTOR ) 10 MG tablet Take 1 tablet (10 mg total) by mouth daily.     chlordiazePOXIDE  (LIBRIUM ) 25 MG capsule 50mg  PO TID x 1D, then  25-50mg  PO BID X 1D, then 25-50mg  PO QD X 1D (Patient not taking: Reported on 02/10/2024) 10 capsule 0   diazepam  (VALIUM ) 5 MG tablet Take 1 tablet (5 mg total) by mouth 2 (two) times daily. (Patient not taking: Reported on 09/03/2023) 10 tablet 0   estradiol  (VIVELLE -DOT) 0.075 MG/24HR Place onto the skin. (Patient not taking: Reported on 02/10/2024)     Facility-Administered Medications Prior to Visit  Medication Dose Route Frequency Provider Last Rate Last Admin   chlorhexidine  (HIBICLENS ) 4 % liquid 1 application  1 application  Topical Once Lillette Reid III, MD       chlorhexidine  (HIBICLENS ) 4 % liquid 1 application  1 application  Topical Once Toth, Paul III, MD         Review of Systems:   Constitutional:   No  weight loss, night sweats,  Fevers, chills, +fatigue, or  lassitude.  HEENT:   No headaches,  Difficulty swallowing,  Tooth/dental problems, or  Sore throat,                No sneezing, itching, ear ache, nasal congestion, post nasal drip,   CV:  No chest pain,  Orthopnea, PND, swelling in lower extremities, anasarca, dizziness, palpitations, syncope.   GI  No heartburn, indigestion, abdominal pain, nausea, vomiting, diarrhea, change in bowel habits, loss of appetite, bloody stools.   Resp:  No chest wall deformity  Skin: no rash or lesions.  GU: no dysuria, change in color of urine, no urgency or frequency.  No flank pain, no hematuria   MS:  No joint pain or swelling.  No decreased range of motion.  No back pain.    Physical Exam  BP (!) 150/80 (BP Location: Left Arm, Patient Position: Sitting, Cuff Size: Normal)   Pulse 96   Ht 5' 3.5" (1.613 m)   Wt 146 lb 6.4 oz (66.4 kg)   SpO2 98%   BMI 25.53 kg/m   GEN: A/Ox3; pleasant , NAD, well nourished    HEENT:  [place]/AT,   NOSE-clear, THROAT-clear, no lesions, no postnasal drip or exudate noted.  NECK:  Supple w/ fair ROM; no JVD; normal carotid impulses w/o bruits; no thyromegaly or nodules palpated; no  lymphadenopathy.    RESP  Clear  P & A; w/o, wheezes/ rales/ or rhonchi. no accessory muscle use, no dullness to percussion  CARD:  RRR, no m/r/g, no peripheral edema, pulses intact, no cyanosis or clubbing.  GI:   Soft & nt; nml bowel sounds; no organomegaly or masses detected.   Musco: Warm bil, no deformities or joint swelling noted.   Neuro: alert, no focal deficits noted.    Skin: Warm, small 2 cutt along right index finger, well approximated, no redness or drainage noted. Dressing clean and dry .     Lab Results:    BNP   ProBNP No results found for: "PROBNP"  Imaging: DG Chest Portable 1 View Result Date: 02/04/2024 CLINICAL DATA:  Chest pain EXAM: PORTABLE CHEST 1 VIEW COMPARISON:  01/14/2024 FINDINGS: Cardiac shadow is stable. Lungs are well aerated bilaterally. No focal infiltrate or effusion is seen. No bony abnormality is noted. IMPRESSION: No active disease. Electronically Signed   By: Violeta Grey M.D.   On: 02/04/2024 23:38   DG Chest 2 View Result Date: 01/14/2024 CLINICAL DATA:  Chest pain EXAM: CHEST - 2 VIEW COMPARISON:  02/28/2023 FINDINGS: The heart size and mediastinal contours are within normal limits. Both lungs are clear. The visualized skeletal structures are unremarkable. IMPRESSION: No active cardiopulmonary disease. Electronically Signed   By: Esmeralda Hedge M.D.   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  Patient is encouraged on smoking cessation.  Continue on Breztri  twice daily.  Recent chest x-ray was clear.  LDCT chest results are pending from last month.  Plan  Patient Instructions  Continue on Breztri  2 puffs Twice daily  , rinse after use.  Xopenex   inhaler As needed   Continue Oxygen  2l/m At bedtime   Activity as tolerated Work on not smoking  TDAP today  Wash cut on the hand well with soap and water , pat dry , cover until healed. Call back if develop redness or drainage.  Set up for in lab sleep study .  Follow up in 6 months with  Dr. Magda Schneider and As needed   Please contact office for sooner follow up if symptoms do not improve or worsen or seek emergency care  '   Chronic respiratory failure with hypoxia (HCC) Continue on oxygen  2 L at bedtime.  Snoring Snoring, restless sleep, nocturnal hypoxemia are all suspicious for underlying sleep apnea.  Will set patient up for an in  lab sleep study.  Patient is on oxygen  will need in-lab sleep study    Tobacco use Smoking cessation discussed in detail.  Hand laceration Small right index finger laceration appears to be healing well.  No significant redness or drainage.  Continue with wound care.  Report any signs of infection such as redness, fever, drainage.  Continue to clean daily and cover with bandage. Tdap today.     Roena Clark, NP 02/10/2024

## 2024-02-10 NOTE — Assessment & Plan Note (Signed)
Smoking cessation discussed in detail 

## 2024-02-11 ENCOUNTER — Telehealth: Payer: Self-pay

## 2024-02-11 NOTE — Telephone Encounter (Signed)
 Spoke with patient. Advises her LDCT form 01/06/2024 has not resulted yet. I have called and spoke with Monroe Antigua in Radiology. Advises her scan will be read this week. Pt aware that I will call her once received.

## 2024-02-11 NOTE — Telephone Encounter (Signed)
 Spoke to patient and patient agrees she will follow up with PCP. Advise patient to call office for any questions. Patient verbalizes understanding.

## 2024-02-12 MED ORDER — BUDESONIDE-FORMOTEROL FUMARATE 160-4.5 MCG/ACT IN AERO: 2.0000 | INHALATION_SPRAY | Freq: Two times a day (BID) | RESPIRATORY_TRACT | 5 refills | Status: DC

## 2024-02-12 NOTE — Addendum Note (Signed)
 Addended by: Drema Genta on: 02/12/2024 01:21 PM   Modules accepted: Orders

## 2024-02-12 NOTE — Telephone Encounter (Signed)
 Called the pt and there was no answer- left detailed msg letting her know of Tammy's response.

## 2024-02-12 NOTE — Telephone Encounter (Signed)
 Okay will change to Symbicort . Rx sent . Stop BREZTRI   Keep follow up

## 2024-02-12 NOTE — Telephone Encounter (Signed)
 Copied from CRM [id number]. Topic: Clinical - Medical Advice >> Feb 11, 2024  9:40 AM Tyronne Galloway wrote: Reason for CRM: Patient states due to her current health issues with high bp, pain in her eyes, cramps, kidney concerns, fatty liver, and bladder concerns she would feel more comfortable to take the symbicort  inhaler instead of the budeson-glycopyrrolate-formoterol  (BREZTRI  AEROSPHERE) 160-9-4.8 MCG/ACT AERO. Patient would like the symbicort  to be sent in [id number] Jonette Nestle, [place] - [address] AT [address] [place] [address] Phone: [phone] Fax: [phone] Hours: Not open 24 hours.  Tammy, please advise.

## 2024-02-13 DIAGNOSIS — I10 Essential (primary) hypertension: Secondary | ICD-10-CM | POA: Diagnosis not present

## 2024-02-13 DIAGNOSIS — I2510 Atherosclerotic heart disease of native coronary artery without angina pectoris: Secondary | ICD-10-CM | POA: Diagnosis not present

## 2024-02-13 DIAGNOSIS — M797 Fibromyalgia: Secondary | ICD-10-CM | POA: Diagnosis not present

## 2024-02-13 DIAGNOSIS — R7301 Impaired fasting glucose: Secondary | ICD-10-CM | POA: Diagnosis not present

## 2024-02-13 DIAGNOSIS — M62551 Muscle wasting and atrophy, not elsewhere classified, right thigh: Secondary | ICD-10-CM | POA: Diagnosis not present

## 2024-02-16 ENCOUNTER — Telehealth: Payer: Self-pay

## 2024-02-16 ENCOUNTER — Other Ambulatory Visit: Payer: Self-pay

## 2024-02-16 DIAGNOSIS — Z122 Encounter for screening for malignant neoplasm of respiratory organs: Secondary | ICD-10-CM

## 2024-02-16 DIAGNOSIS — F17210 Nicotine dependence, cigarettes, uncomplicated: Secondary | ICD-10-CM

## 2024-02-16 DIAGNOSIS — Z87891 Personal history of nicotine dependence: Secondary | ICD-10-CM

## 2024-02-16 NOTE — Telephone Encounter (Signed)
CT results reviewed with patient 

## 2024-02-19 ENCOUNTER — Ambulatory Visit: Admitting: Pulmonary Disease

## 2024-02-19 DIAGNOSIS — R5383 Other fatigue: Secondary | ICD-10-CM | POA: Diagnosis not present

## 2024-02-19 DIAGNOSIS — Z0000 Encounter for general adult medical examination without abnormal findings: Secondary | ICD-10-CM | POA: Diagnosis not present

## 2024-02-19 DIAGNOSIS — I10 Essential (primary) hypertension: Secondary | ICD-10-CM | POA: Diagnosis not present

## 2024-02-19 DIAGNOSIS — E559 Vitamin D deficiency, unspecified: Secondary | ICD-10-CM | POA: Diagnosis not present

## 2024-02-19 DIAGNOSIS — J449 Chronic obstructive pulmonary disease, unspecified: Secondary | ICD-10-CM | POA: Diagnosis not present

## 2024-02-20 DIAGNOSIS — J449 Chronic obstructive pulmonary disease, unspecified: Secondary | ICD-10-CM | POA: Diagnosis not present

## 2024-02-20 DIAGNOSIS — G4736 Sleep related hypoventilation in conditions classified elsewhere: Secondary | ICD-10-CM | POA: Diagnosis not present

## 2024-02-23 ENCOUNTER — Telehealth (HOSPITAL_BASED_OUTPATIENT_CLINIC_OR_DEPARTMENT_OTHER): Payer: Self-pay

## 2024-02-23 NOTE — Telephone Encounter (Signed)
 Left pt message that we were waiting on Approval from insurance regarding her sleep study per referral notes and once that is approved we will schedule this for her   Copied from CRM [id number]. Topic: Clinical - Request for Lab/Test Order >> Feb 20, 2024  8:40 AM Crist Dominion wrote: Reason for CRM: Patient is requesting an update from [facility] regarding doing a sleep study test at the hospital, Sarah Reeves preferred) patient states she is very anxious to get this completed and would love to speak with someone regarding this matter as it was discussed at most previous appointment on 4/22

## 2024-02-26 ENCOUNTER — Other Ambulatory Visit: Payer: Self-pay

## 2024-02-26 ENCOUNTER — Emergency Department (HOSPITAL_BASED_OUTPATIENT_CLINIC_OR_DEPARTMENT_OTHER)

## 2024-02-26 ENCOUNTER — Emergency Department (HOSPITAL_BASED_OUTPATIENT_CLINIC_OR_DEPARTMENT_OTHER)
Admission: EM | Admit: 2024-02-26 | Discharge: 2024-02-26 | Discharge status: Against Medical Advice | Attending: Emergency Medicine | Admitting: Emergency Medicine

## 2024-02-26 ENCOUNTER — Encounter (HOSPITAL_BASED_OUTPATIENT_CLINIC_OR_DEPARTMENT_OTHER): Payer: Self-pay | Admitting: Emergency Medicine

## 2024-02-26 ENCOUNTER — Telehealth: Payer: Self-pay | Admitting: Cardiology

## 2024-02-26 DIAGNOSIS — M25561 Pain in right knee: Secondary | ICD-10-CM | POA: Insufficient documentation

## 2024-02-26 DIAGNOSIS — G8929 Other chronic pain: Secondary | ICD-10-CM | POA: Insufficient documentation

## 2024-02-26 DIAGNOSIS — M79605 Pain in left leg: Secondary | ICD-10-CM | POA: Diagnosis not present

## 2024-02-26 DIAGNOSIS — Z5321 Procedure and treatment not carried out due to patient leaving prior to being seen by health care provider: Secondary | ICD-10-CM | POA: Insufficient documentation

## 2024-02-26 MED ORDER — OLMESARTAN MEDOXOMIL 40 MG PO TABS: 40.0000 mg | ORAL_TABLET | Freq: Every day | ORAL | 6 refills | Status: DC

## 2024-02-26 NOTE — Telephone Encounter (Signed)
 Patient identification verified by 2 forms. Sarah Duck, RN     Called and spoke to patient  Patient states:  - Blood pressure has been running high. - Bps provided 179/91 (today), 149/109 (at ED), 173/91, 142/83, 141/81 - Taking olmesartan  as prescribed.   Patient denies:  - abnormal symptoms along with hypertension              Interventions/Plan: - Previous enounter note from Dr. Addie Holstein on 02/09/24 recommended patient schedule appt with pcp for f/u on hypertension. She has not seen PCP yet since last encounter.  - Patient will call PCP office for F/U    Reviewed ED warning signs/precautions  Patient agrees with plan, no questions at this time

## 2024-02-26 NOTE — Telephone Encounter (Signed)
 Patient says she is returning a call to discuss results.

## 2024-02-26 NOTE — ED Triage Notes (Addendum)
 PtPOV staggered gait- c/o chronic R knee pain. Woke up this AM with L leg pain.  Denies known injury. Denies known redness, swelling.

## 2024-02-26 NOTE — Telephone Encounter (Signed)
 Spoke to patient Marlana Silvan NP advised to increase Olmesartan  40 mg daily.Advised to keep appointment with Dr.Berry Mon 5/12 at 11:15 am.

## 2024-02-26 NOTE — Addendum Note (Signed)
 Addended by: Sherryn Donalds on: 02/26/2024 05:31 PM   Modules accepted: Orders

## 2024-02-26 NOTE — Telephone Encounter (Signed)
 Spoke to patient she stated she is very anxious B/P elevated 153/93,139/100 pulse 109.Stated she took Olmesartan  20 mg this morning and 20 mg this afternoon.Stated her PCP will not manage her B/P.She advised her to have cardiologist manage.She wants to know if Marlana Silvan NP can prescribe another B/P medication.She also wants Emily's advice on recent chest ct.Sarah Reeves is in clinic this afternoon.I will send message to her for advice.

## 2024-02-26 NOTE — Telephone Encounter (Signed)
 Pt is returning nurse call to after she was advised to contact her pcp regarding her bp. Pt stated her pcp told her this would have to be handled by her cardiologist. She stated the Olmesartan  is not working and she'd like to speak with he nurse again. Please advise.

## 2024-02-27 ENCOUNTER — Telehealth: Payer: Self-pay | Admitting: Cardiology

## 2024-02-27 NOTE — Telephone Encounter (Signed)
 Pt c/o BP issue: STAT if pt c/o blurred vision, one-sided weakness or slurred speech.  STAT if BP is GREATER than 180/120 TODAY.  STAT if BP is LESS than 90/60 and SYMPTOMATIC TODAY  1. What is your BP concern? Hypertension   2. Have you taken any BP medication today? Yes  3. What are your last 5 BP readings? 8:30 am 158/97 HR 107 12:00 PM 139/97 hr 106   4. Are you having any other symptoms (ex. Dizziness, headache, blurred vision, passed out)? Tired and CP that comes and goes   Denied CP at time of call. Patient began crying while on the phone stating she was just sad wants to get her BP under control. She states the medication that was increased is not helping like it should and she is currently bed ridden due to her messed up knee. Please advise.

## 2024-02-27 NOTE — Telephone Encounter (Signed)
 Called pt in regards to BP readings.  Advised pt Olmesartan  was increased yesterday to 40 mg PO every day. Advised pt to give medication time to get into her system to become effective.  Pt expresses went to urgent care for knee pain and doesn't understand why medication is not working fast enough.  Advised pt pain increases BP.  Also advised pt that unfortunately it may take time for medication to be effective.  Pt has an OV with Dr. Katheryne Pane on 03/01/24.  Advised pt if has concerns over the weekend to call 911 or go to the emergency room.  Pt expresses understanding.

## 2024-03-01 ENCOUNTER — Ambulatory Visit: Admitting: Cardiovascular Disease

## 2024-03-04 ENCOUNTER — Ambulatory Visit: Payer: Self-pay | Admitting: *Deleted

## 2024-03-04 ENCOUNTER — Ambulatory Visit: Attending: Pharmacist | Admitting: Pharmacist

## 2024-03-04 NOTE — Progress Notes (Deleted)
 Patient ID: Sarah Reeves                 DOB: 1961-09-15                    MRN: [id number]     HPI: Sarah Reeves is a 63 y.o. female patient referred to lipid clinic by ***. PMH is significant for   Current Medications:  Intolerances:  Risk Factors:  LDL goal:   Diet:   Exercise:   Family History:   Social History:   Labs: TC 272, Trigs 84, HDL 132, LDL 127 (01/13/24)  Past Medical History:  Diagnosis Date   Allergy     Angio-edema    Anxiety    Arthritis    Asthma    Clostridioides difficile infection    Complication of anesthesia    takes alot to sedated   COPD (chronic obstructive pulmonary disease) (HCC)    Depression    Emphysema of lung (HCC)    Fibromyalgia    GERD (gastroesophageal reflux disease)    Hepatitis C    (Dr. Andriette Keeling) Treated with Harvoni March-May 2016   Herpes simplex    Hyperlipemia    Hypertension    Insomnia    Mental disorder    Neuropathy    Osteoporosis    Oxygen  deficiency    PONV (postoperative nausea and vomiting)    Sleep apnea    Substance abuse (HCC)    Urticaria     Current Outpatient Medications on File Prior to Visit  Medication Sig Dispense Refill   methocarbamol (ROBAXIN) 750 MG tablet Take 750 mg by mouth 3 (three) times daily.     promethazine  (PHENERGAN ) 25 MG tablet Take 25 mg by mouth every 4 (four) hours as needed.     Ascorbic Acid  (VITAMIN C ) 1000 MG tablet Take 1,000 mg by mouth daily.     aspirin  EC 81 MG tablet Take 1 tablet (81 mg total) by mouth daily. 90 tablet 3   budesonide -formoterol  (SYMBICORT ) 160-4.5 MCG/ACT inhaler Inhale 2 puffs into the lungs 2 (two) times daily. 1 each 5   cetirizine  (ZYRTEC ) 10 MG tablet Take 1 tablet (10 mg total) by mouth daily. 30 tablet 5   cilostazol  (PLETAL ) 50 MG tablet Take 1 tablet (50 mg total) by mouth 2 (two) times daily. 180 tablet 3   cyclobenzaprine  (FLEXERIL ) 10 MG tablet Take 10 mg by mouth 3 (three) times daily as needed for muscle spasms.     DULoxetine   (CYMBALTA ) 30 MG capsule Take 30 mg by mouth daily.     EPINEPHrine  (AUVI-Q ) 0.1 MG/0.1ML SOAJ Inject into the muscle.     gabapentin  (NEURONTIN ) 300 MG capsule Take 300 mg by mouth daily as needed (nerve pain).     levalbuterol  (XOPENEX  HFA) 45 MCG/ACT inhaler INHALE 2 PUFFS INTO THE LUNGS EVERY 4 HOURS AS NEEDED FOR WHEEZING 15 g 1   levalbuterol  (XOPENEX ) 1.25 MG/3ML nebulizer solution Take 1.25 mg by nebulization every 4 (four) hours as needed for wheezing. 72 mL 5   LORazepam  (ATIVAN ) 2 MG tablet Take 2 mg by mouth in the morning, at noon, and at bedtime.     Na Sulfate-K Sulfate-Mg Sulf 17.5-3.13-1.6 GM/177ML SOLN Take 1 kit by mouth once.     nitroGLYCERIN  (NITROSTAT ) 0.4 MG SL tablet Place 1 tablet (0.4 mg total) under the tongue every 5 (five) minutes as needed for chest pain. 25 tablet 11   olmesartan  (BENICAR ) 40 MG tablet Take  1 tablet (40 mg total) by mouth daily. 30 tablet 6   rosuvastatin  (CRESTOR ) 10 MG tablet Take 1 tablet (10 mg total) by mouth daily.     valACYclovir  (VALTREX ) 500 MG tablet Take 500 mg by mouth daily as needed (outbreaks).     Vitamin D , Cholecalciferol , 25 MCG (1000 UT) TABS Take 25 mcg by mouth daily. 60 tablet 3   zolpidem  (AMBIEN ) 10 MG tablet Take 10 mg by mouth at bedtime.     Current Facility-Administered Medications on File Prior to Visit  Medication Dose Route Frequency Provider Last Rate Last Admin   chlorhexidine  (HIBICLENS ) 4 % liquid 1 application  1 application  Topical Once Toth, Paul III, MD       chlorhexidine  (HIBICLENS ) 4 % liquid 1 application  1 application  Topical Once Toth, Paul III, MD        Allergies  Allergen Reactions   Keflex  Glenn.[place] ] Hives, Itching and Other (See Comments)    Tongue and throat itching/tingling   Shellfish Allergy  Other (See Comments)    Unknown reaction; "MD told me I was allergic"   Vancomycin  Anaphylaxis    Face and tongue swells   Zoloft  [Sertraline  Hcl] Other (See Comments)    REACTION:  Nightmares, Grind teeth really bad   Latex Itching    Itch and blisters from contact   Amitriptyline     Hot, itching, tongue tingling, breakthrough bleeding.   Clindamycin/Lincomycin Hives and Other (See Comments)    Stated that this causes her to get really horse and made her feel really hot   Other Other (See Comments)   Penicillins    Pentosan Polysulfate Sodium Swelling    Throat swelling   Shellfish-Derived Products Other (See Comments)   Sulfa Antibiotics Other (See Comments)   Topamax [Topiramate]     Says it makes her "constipation and she can't urinate normally"   Uribel [Meth-Hyo-M Bl-Na Phos-Ph Sal] Nausea Only   Uribel [Urelle] Swelling and Other (See Comments)    Patient stated this makes her stomach swell and makes her feel si   Lamotrigine Rash   Sulfamethoxazole-Trimethoprim Rash    Patient developed feelings of dizziness, rash and itchy hands.  Patient then vomited and had diarrhea.    Assessment/Plan:  1. Hyperlipidemia -  Reviewed options for lowering LDL cholesterol, including statins, ezetimibe , PCSK9 inhibitors, Nexletol/Nexlizet, and Leqvio. Discussed efficacy, dosing, side effects, and copay information.

## 2024-03-05 ENCOUNTER — Other Ambulatory Visit: Payer: Self-pay

## 2024-03-05 ENCOUNTER — Emergency Department (HOSPITAL_COMMUNITY)

## 2024-03-05 ENCOUNTER — Emergency Department (HOSPITAL_COMMUNITY)
Admission: EM | Admit: 2024-03-05 | Discharge: 2024-03-06 | Disposition: A | Discharge status: Home / Self Care | Attending: Emergency Medicine | Admitting: Emergency Medicine

## 2024-03-05 DIAGNOSIS — Z91040 Latex allergy status: Secondary | ICD-10-CM | POA: Diagnosis not present

## 2024-03-05 DIAGNOSIS — Z7982 Long term (current) use of aspirin: Secondary | ICD-10-CM | POA: Diagnosis not present

## 2024-03-05 DIAGNOSIS — R102 Pelvic and perineal pain: Secondary | ICD-10-CM | POA: Diagnosis not present

## 2024-03-05 DIAGNOSIS — J45909 Unspecified asthma, uncomplicated: Secondary | ICD-10-CM | POA: Insufficient documentation

## 2024-03-05 DIAGNOSIS — Z79899 Other long term (current) drug therapy: Secondary | ICD-10-CM | POA: Diagnosis not present

## 2024-03-05 DIAGNOSIS — F1010 Alcohol abuse, uncomplicated: Secondary | ICD-10-CM | POA: Diagnosis not present

## 2024-03-05 DIAGNOSIS — F329 Major depressive disorder, single episode, unspecified: Secondary | ICD-10-CM | POA: Insufficient documentation

## 2024-03-05 DIAGNOSIS — R1032 Left lower quadrant pain: Secondary | ICD-10-CM | POA: Diagnosis not present

## 2024-03-05 DIAGNOSIS — N854 Malposition of uterus: Secondary | ICD-10-CM | POA: Diagnosis not present

## 2024-03-05 DIAGNOSIS — R45851 Suicidal ideations: Secondary | ICD-10-CM | POA: Diagnosis not present

## 2024-03-05 DIAGNOSIS — R259 Unspecified abnormal involuntary movements: Secondary | ICD-10-CM | POA: Diagnosis not present

## 2024-03-05 DIAGNOSIS — N938 Other specified abnormal uterine and vaginal bleeding: Secondary | ICD-10-CM | POA: Diagnosis present

## 2024-03-05 DIAGNOSIS — Z046 Encounter for general psychiatric examination, requested by authority: Secondary | ICD-10-CM

## 2024-03-05 DIAGNOSIS — I2510 Atherosclerotic heart disease of native coronary artery without angina pectoris: Secondary | ICD-10-CM | POA: Diagnosis not present

## 2024-03-05 DIAGNOSIS — K5730 Diverticulosis of large intestine without perforation or abscess without bleeding: Secondary | ICD-10-CM | POA: Diagnosis not present

## 2024-03-05 DIAGNOSIS — N950 Postmenopausal bleeding: Secondary | ICD-10-CM | POA: Diagnosis not present

## 2024-03-05 LAB — COMPREHENSIVE METABOLIC PANEL WITH GFR
ALT: 30 U/L (ref 0–44)
AST: 33 U/L (ref 15–41)
Albumin: 4.5 g/dL (ref 3.5–5.0)
Alkaline Phosphatase: 68 U/L (ref 38–126)
Anion gap: 11 (ref 5–15)
BUN: 7 mg/dL — ABNORMAL LOW (ref 8–23)
CO2: 23 mmol/L (ref 22–32)
Calcium: 9.4 mg/dL (ref 8.9–10.3)
Chloride: 89 mmol/L — ABNORMAL LOW (ref 98–111)
Creatinine, Ser: 0.52 mg/dL (ref 0.44–1.00)
GFR, Estimated: >60 mL/min (ref >60)
Glucose, Bld: 106 mg/dL — ABNORMAL HIGH (ref 70–99)
Potassium: 4.3 mmol/L (ref 3.5–5.1)
Sodium: 123 mmol/L — ABNORMAL LOW (ref 135–145)
Total Bilirubin: 0.5 mg/dL (ref 0.0–1.2)
Total Protein: 7.5 g/dL (ref 6.5–8.1)

## 2024-03-05 LAB — URINALYSIS, ROUTINE W REFLEX MICROSCOPIC
Bilirubin Urine: NEGATIVE
Glucose, UA: NEGATIVE mg/dL
Hgb urine dipstick: NEGATIVE
Ketones, ur: NEGATIVE mg/dL
Leukocytes,Ua: NEGATIVE
Nitrite: NEGATIVE
Protein, ur: NEGATIVE mg/dL
Specific Gravity, Urine: 1.002 — ABNORMAL LOW (ref 1.005–1.030)
pH: 6 (ref 5.0–8.0)

## 2024-03-05 LAB — RAPID URINE DRUG SCREEN, HOSP PERFORMED
Amphetamines: NOT DETECTED
Barbiturates: NOT DETECTED
Benzodiazepines: POSITIVE — AB
Cocaine: NOT DETECTED
Opiates: NOT DETECTED
Tetrahydrocannabinol: NOT DETECTED

## 2024-03-05 LAB — CBC
HCT: 41.2 % (ref 36.0–46.0)
Hemoglobin: 13.9 g/dL (ref 12.0–15.0)
MCH: 32.2 pg (ref 26.0–34.0)
MCHC: 33.7 g/dL (ref 30.0–36.0)
MCV: 95.4 fL (ref 80.0–100.0)
Platelets: 204 10*3/uL (ref 150–400)
RBC: 4.32 MIL/uL (ref 3.87–5.11)
RDW: 12.8 % (ref 11.5–15.5)
WBC: 9.8 10*3/uL (ref 4.0–10.5)
nRBC: 0 % (ref 0.0–0.2)

## 2024-03-05 LAB — ETHANOL: Alcohol, Ethyl (B): 282 mg/dL — ABNORMAL HIGH (ref <15)

## 2024-03-05 MED ORDER — LORAZEPAM 2 MG/ML IJ SOLN
1.0000 mg | INTRAMUSCULAR | Status: DC | PRN
  Administered 2024-03-06: 3 mg via INTRAVENOUS
  Administered 2024-03-06: 1 mg via INTRAVENOUS
  Filled 2024-03-05: qty 1
  Filled 2024-03-05: qty 2

## 2024-03-05 MED ORDER — LORAZEPAM 1 MG PO TABS
1.0000 mg | ORAL_TABLET | ORAL | Status: DC | PRN
  Administered 2024-03-06: 1 mg via ORAL
  Filled 2024-03-05: qty 1

## 2024-03-05 MED ORDER — IOHEXOL 300 MG/ML  SOLN
100.0000 mL | Freq: Once | INTRAMUSCULAR | Status: AC | PRN
  Administered 2024-03-05: 100 mL via INTRAVENOUS

## 2024-03-05 MED ORDER — THIAMINE MONONITRATE 100 MG PO TABS
100.0000 mg | ORAL_TABLET | Freq: Every day | ORAL | Status: DC
  Administered 2024-03-06: 100 mg via ORAL
  Filled 2024-03-05: qty 1

## 2024-03-05 MED ORDER — FOLIC ACID 1 MG PO TABS
1.0000 mg | ORAL_TABLET | Freq: Every day | ORAL | Status: DC
  Administered 2024-03-06: 1 mg via ORAL
  Filled 2024-03-05: qty 1

## 2024-03-05 MED ORDER — SODIUM CHLORIDE 0.9 % IV BOLUS
1000.0000 mL | Freq: Once | INTRAVENOUS | Status: AC
  Administered 2024-03-05: 1000 mL via INTRAVENOUS

## 2024-03-05 MED ORDER — ADULT MULTIVITAMIN W/MINERALS CH
1.0000 | ORAL_TABLET | Freq: Every day | ORAL | Status: DC
  Administered 2024-03-06: 1 via ORAL
  Filled 2024-03-05: qty 1

## 2024-03-05 MED ORDER — THIAMINE HCL 100 MG/ML IJ SOLN
100.0000 mg | Freq: Every day | INTRAMUSCULAR | Status: DC
  Filled 2024-03-05: qty 2

## 2024-03-05 NOTE — ED Triage Notes (Signed)
 Pt arrives with [employer] under IVC " respondent stated multiple times that they would kill themselves, was going to run into traffic, danger to herself and others"  EMS called to scene for "medical emergency" pt refused to be transported.   Pt reports last night that she had left lower abdominal pain that lasted through the night, she says that when she went to the bathroom this morning she had a lot of vaginal bleeding. Pain did get better, but it is starting to hurt again. Pt says she wears about 2.5 liters oxygen  when she sleeps.  Pt says that she drinks every day, unsure how much she has drank today, last drink just PTA. She says she has been trying to get into detox for three months, but is "over it all"   Denies SI at this time.

## 2024-03-05 NOTE — ED Notes (Signed)
 Labs sent with temporary stickers to hold until orders

## 2024-03-05 NOTE — ED Provider Notes (Signed)
 [facility] AT [facility] Provider Note   CSN: [id number] Arrival date & time: 03/05/24  2130     History  Chief Complaint  Patient presents with   Vaginal Bleeding   Psychiatric Evaluation    Sarah Reeves is a 63 y.o. female.  Patient is a 63 year old female with a past medical history of alcohol use disorder, asthma, OSA on O2 at night, depression presenting to the emergency department with abdominal pain and under IVC.  The patient states that her friend who is a Veterinary surgeon was concerned about her since that she has been going under a lot of stress recently due to helping out a boyfriend and giving him a lot of money.  Her IVC filed stated that she was complaining of suicidal ideations with threats to go out into traffic.  The patient states that she is not suicidal and denies any plan to kill herself.  The patient also reports that she has had left lower quadrant abdominal pain since last night and occasionally radiates into the right lower quadrant.  She states that she went to the bathroom to urinate last night and had "a large black ball", and is unsure what it was or where it came from.  She states that she still has her uterus and has not noticed any other bleeding.  Denies any dysuria.  Denies any black or bloody stools.  She denies any fevers, nausea or vomiting.  She states that she is not sexually active and denies any concern for STD.  She states that she is still drinking daily and her last drink was earlier tonight.  She states she has never had alcohol withdrawal in the past.  The history is provided by the patient and the police.  Vaginal Bleeding      Home Medications Prior to Admission medications   Medication Sig Start Date End Date Taking? Authorizing Provider  Ascorbic Acid  (VITAMIN C ) 1000 MG tablet Take 1,000 mg by mouth daily.    [provider]  aspirin  EC 81 MG tablet Take 1 tablet (81 mg total) by mouth daily.  08/04/18   Arleen Lacer, MD  budesonide -formoterol  (SYMBICORT ) 160-4.5 MCG/ACT inhaler Inhale 2 puffs into the lungs 2 (two) times daily. 02/12/24   Parrett, Macdonald Savoy, NP  cetirizine  (ZYRTEC ) 10 MG tablet Take 1 tablet (10 mg total) by mouth daily. 09/09/22   Ardie Kras, FNP  cilostazol  (PLETAL ) 50 MG tablet Take 1 tablet (50 mg total) by mouth 2 (two) times daily. 10/07/23   Avanell Leigh, MD  cyclobenzaprine  (FLEXERIL ) 10 MG tablet Take 10 mg by mouth 3 (three) times daily as needed for muscle spasms.    [provider]  DULoxetine  (CYMBALTA ) 30 MG capsule Take 30 mg by mouth daily. 07/31/18   [provider]  EPINEPHrine  (AUVI-Q ) 0.1 MG/0.1ML SOAJ Inject into the muscle. 08/18/18   [provider]  gabapentin  (NEURONTIN ) 300 MG capsule Take 300 mg by mouth daily as needed (nerve pain). 11/29/19   [provider]  levalbuterol  (XOPENEX  HFA) 45 MCG/ACT inhaler INHALE 2 PUFFS INTO THE LUNGS EVERY 4 HOURS AS NEEDED FOR WHEEZING 02/07/23   Parrett, Tammy S, NP  levalbuterol  (XOPENEX ) 1.25 MG/3ML nebulizer solution Take 1.25 mg by nebulization every 4 (four) hours as needed for wheezing. 04/25/23   Margaretann Sharper, MD  LORazepam  (ATIVAN ) 2 MG tablet Take 2 mg by mouth in the morning, at noon, and at bedtime. 12/17/19  [provider]  methocarbamol (ROBAXIN) 750 MG tablet Take 750 mg by mouth 3 (three) times daily. 12/09/23   [provider]  Na Sulfate-K Sulfate-Mg Sulf 17.5-3.13-1.6 GM/177ML SOLN Take 1 kit by mouth once. 05/02/23   [provider]  nitroGLYCERIN  (NITROSTAT ) 0.4 MG SL tablet Place 1 tablet (0.4 mg total) under the tongue every 5 (five) minutes as needed for chest pain. 10/07/23 01/05/24  Arleen Lacer, MD  olmesartan  (BENICAR ) 40 MG tablet Take 1 tablet (40 mg total) by mouth daily. 02/26/24   Jude Norton, NP  promethazine  (PHENERGAN ) 25 MG tablet Take 25 mg by mouth every 4 (four) hours as needed. 01/21/24   [provider]  rosuvastatin  (CRESTOR ) 10 MG tablet Take 1 tablet (10 mg total) by mouth daily. 07/23/23 10/21/23  Arleen Lacer, MD  valACYclovir  (VALTREX ) 500 MG tablet Take 500 mg by mouth daily as needed (outbreaks). 11/16/19   [provider]  Vitamin D , Cholecalciferol , 25 MCG (1000 UT) TABS Take 25 mcg by mouth daily. 11/27/22   Tania Familia, NP  zolpidem  (AMBIEN ) 10 MG tablet Take 10 mg by mouth at bedtime. 03/20/21   [provider]      Allergies    Keflex  [cephalexin ], Shellfish allergy , Vancomycin , Zoloft  [sertraline  hcl], Latex, Amitriptyline, Clindamycin/lincomycin, Other, Penicillins, Pentosan polysulfate sodium, Shellfish-derived products, Sulfa antibiotics, Topamax [topiramate], Uribel [meth-hyo-m bl-na phos-ph sal], Uribel [urelle], Lamotrigine, and Sulfamethoxazole-trimethoprim    Review of Systems   Review of Systems  Genitourinary:  Positive for vaginal bleeding.    Physical Exam Updated Vital Signs BP 120/65 (BP Location: Left Arm)   Pulse 94   Temp 97.7 F (36.5 C) (Oral)   Resp 18   Ht 5\' 3"  (1.6 m)   Wt 65 kg   SpO2 95%   BMI 25.38 kg/m  Physical Exam Vitals and nursing note reviewed.  Constitutional:      General: She is not in acute distress.    Appearance: Normal appearance.  HENT:     Head: Normocephalic and atraumatic.     Nose: Nose normal.     Mouth/Throat:     Mouth: Mucous membranes are moist.     Pharynx: Oropharynx is clear.  Eyes:     Extraocular Movements: Extraocular movements intact.     Conjunctiva/sclera: Conjunctivae normal.  Cardiovascular:     Rate and Rhythm: Normal rate and regular rhythm.     Heart sounds: Normal heart sounds.  Pulmonary:     Effort: Pulmonary effort is normal.     Breath sounds: Normal breath sounds.  Abdominal:     General: Abdomen is flat.     Palpations: Abdomen is soft.     Tenderness: There is abdominal tenderness (minimal tenderness across lower abdomen). There is no  guarding or rebound.  Musculoskeletal:        General: Normal range of motion.     Cervical back: Normal range of motion.  Skin:    General: Skin is warm and dry.  Neurological:     General: No focal deficit present.     Mental Status: She is alert and oriented to person, place, and time.  Psychiatric:        Mood and Affect: Mood normal.        Behavior: Behavior normal.     ED Results / Procedures / Treatments   Labs (all labs ordered are listed, but only abnormal results are displayed) Labs Reviewed  COMPREHENSIVE METABOLIC PANEL WITH  GFR - Abnormal; Notable for the following components:      Result Value   Sodium 123 (*)    Chloride 89 (*)    Glucose, Bld 106 (*)    BUN 7 (*)    All other components within normal limits  ETHANOL - Abnormal; Notable for the following components:   Alcohol, Ethyl (B) 282 (*)    All other components within normal limits  CBC  RAPID URINE DRUG SCREEN, HOSP PERFORMED  URINALYSIS, ROUTINE W REFLEX MICROSCOPIC    EKG None  Radiology US  Pelvis Complete Result Date: 03/05/2024 CLINICAL DATA:  Pelvic pain.  Postmenopausal bleeding. EXAM: TRANSABDOMINAL AND TRANSVAGINAL ULTRASOUND OF PELVIS DOPPLER ULTRASOUND OF OVARIES TECHNIQUE: Both transabdominal and transvaginal ultrasound examinations of the pelvis were performed. Transabdominal technique was performed for global imaging of the pelvis including uterus, ovaries, adnexal regions, and pelvic cul-de-sac. It was necessary to proceed with endovaginal exam following the transabdominal exam to visualize the ovaries and endometrium. Color and duplex Doppler ultrasound was utilized to evaluate blood flow to the ovaries. COMPARISON:  CT abdomen and pelvis 08/27/2019. FINDINGS: Uterus Measurements: 6.4 x 2.0 x 3.0 cm = volume: 20 mL. No fibroids or other mass visualized. Uterus is retroverted. Endometrium Thickness: 2.2 mm.  No focal abnormality visualized. Right ovary Measurements: 1.9 x 0.9 x 1.4 cm =  volume: 1.3 mL. Normal appearance/no adnexal mass. Left ovary Measurements: 1.2 x 0.7 x 1.3 cm = volume: 0.5 mL. Normal appearance/no adnexal mass. Pulsed Doppler evaluation of both ovaries demonstrates normal low-resistance arterial and venous waveforms. Other findings No abnormal free fluid. IMPRESSION: No acute abnormality identified. The endometrium measures 2.2 mm in thickness. In the setting of post-menopausal bleeding, this is consistent with a benign etiology such as endometrial atrophy. If bleeding remains unresponsive to hormonal or medical therapy, sonohysterogram should be considered for focal lesion work-up. (Ref: Radiological Reasoning: Algorithmic Workup of Abnormal Vaginal Bleeding with Endovaginal Sonography and Sonohysterography. AJR 2008; 366:Y40-34) Electronically Signed   By: Tyron Gallon M.D.   On: 03/05/2024 23:04   US  Transvaginal Non-OB Result Date: 03/05/2024 CLINICAL DATA:  Pelvic pain.  Postmenopausal bleeding. EXAM: TRANSABDOMINAL AND TRANSVAGINAL ULTRASOUND OF PELVIS DOPPLER ULTRASOUND OF OVARIES TECHNIQUE: Both transabdominal and transvaginal ultrasound examinations of the pelvis were performed. Transabdominal technique was performed for global imaging of the pelvis including uterus, ovaries, adnexal regions, and pelvic cul-de-sac. It was necessary to proceed with endovaginal exam following the transabdominal exam to visualize the ovaries and endometrium. Color and duplex Doppler ultrasound was utilized to evaluate blood flow to the ovaries. COMPARISON:  CT abdomen and pelvis 08/27/2019. FINDINGS: Uterus Measurements: 6.4 x 2.0 x 3.0 cm = volume: 20 mL. No fibroids or other mass visualized. Uterus is retroverted. Endometrium Thickness: 2.2 mm.  No focal abnormality visualized. Right ovary Measurements: 1.9 x 0.9 x 1.4 cm = volume: 1.3 mL. Normal appearance/no adnexal mass. Left ovary Measurements: 1.2 x 0.7 x 1.3 cm = volume: 0.5 mL. Normal appearance/no adnexal mass. Pulsed  Doppler evaluation of both ovaries demonstrates normal low-resistance arterial and venous waveforms. Other findings No abnormal free fluid. IMPRESSION: No acute abnormality identified. The endometrium measures 2.2 mm in thickness. In the setting of post-menopausal bleeding, this is consistent with a benign etiology such as endometrial atrophy. If bleeding remains unresponsive to hormonal or medical therapy, sonohysterogram should be considered for focal lesion work-up. (Ref: Radiological Reasoning: Algorithmic Workup of Abnormal Vaginal Bleeding with Endovaginal Sonography and Sonohysterography. AJR 2008; 742:V95-63) Electronically Signed   By:  Tyron Gallon M.D.   On: 03/05/2024 23:04   US  Art/Ven Flow Abd Pelv Doppler Result Date: 03/05/2024 CLINICAL DATA:  Pelvic pain.  Postmenopausal bleeding. EXAM: TRANSABDOMINAL AND TRANSVAGINAL ULTRASOUND OF PELVIS DOPPLER ULTRASOUND OF OVARIES TECHNIQUE: Both transabdominal and transvaginal ultrasound examinations of the pelvis were performed. Transabdominal technique was performed for global imaging of the pelvis including uterus, ovaries, adnexal regions, and pelvic cul-de-sac. It was necessary to proceed with endovaginal exam following the transabdominal exam to visualize the ovaries and endometrium. Color and duplex Doppler ultrasound was utilized to evaluate blood flow to the ovaries. COMPARISON:  CT abdomen and pelvis 08/27/2019. FINDINGS: Uterus Measurements: 6.4 x 2.0 x 3.0 cm = volume: 20 mL. No fibroids or other mass visualized. Uterus is retroverted. Endometrium Thickness: 2.2 mm.  No focal abnormality visualized. Right ovary Measurements: 1.9 x 0.9 x 1.4 cm = volume: 1.3 mL. Normal appearance/no adnexal mass. Left ovary Measurements: 1.2 x 0.7 x 1.3 cm = volume: 0.5 mL. Normal appearance/no adnexal mass. Pulsed Doppler evaluation of both ovaries demonstrates normal low-resistance arterial and venous waveforms. Other findings No abnormal free fluid. IMPRESSION:  No acute abnormality identified. The endometrium measures 2.2 mm in thickness. In the setting of post-menopausal bleeding, this is consistent with a benign etiology such as endometrial atrophy. If bleeding remains unresponsive to hormonal or medical therapy, sonohysterogram should be considered for focal lesion work-up. (Ref: Radiological Reasoning: Algorithmic Workup of Abnormal Vaginal Bleeding with Endovaginal Sonography and Sonohysterography. AJR 2008; 782:N56-21) Electronically Signed   By: Tyron Gallon M.D.   On: 03/05/2024 23:04    Procedures Procedures    Medications Ordered in ED Medications  LORazepam  (ATIVAN ) tablet 1-4 mg (has no administration in time range)    Or  LORazepam  (ATIVAN ) injection 1-4 mg (has no administration in time range)  thiamine  (VITAMIN B1) tablet 100 mg (has no administration in time range)    Or  thiamine  (VITAMIN B1) injection 100 mg (has no administration in time range)  folic acid  (FOLVITE ) tablet 1 mg (has no administration in time range)  multivitamin with minerals tablet 1 tablet (has no administration in time range)  sodium chloride  0.9 % bolus 1,000 mL (1,000 mLs Intravenous New Bag/Given 03/05/24 2248)    ED Course/ Medical Decision Making/ A&P Clinical Course as of 03/05/24 2323  Fri Mar 05, 2024  2230 Na 123, baseline ~126. Suspect beer potomania. Will be given a fluid bolus now. Hgb normal, no significant anemia. [VK]  2309 Pelvic exam performed with Sarah Reeves NT as chaparone. No blood in the vault, no vaginal lacerations or tears. Pelvic US  normal, possible atrophy as cause of bleeding. With continued pain will have CTAP. UA is pending. [VK]  2318 Patient signed out to Dr. Morris Arch pending CTAP and UA for medical clearance. [VK]    Clinical Course User Index [VK] Kingsley, Yarelly Kuba K, DO                                 Medical Decision Making This patient presents to the ED with chief complaint(s) of IVC, abdominal pain with  pertinent past medical history of ETOH use disorder, COPD/asthma, OSA on O2 at night which further complicates the presenting complaint. The complaint involves an extensive differential diagnosis and also carries with it a high risk of complications and morbidity.    The differential diagnosis includes postmenopausal bleeding, foreign body, UTI, no significant abdominal injuries making intra-abdominal infection  less likely, alcohol intoxication, suicidal ideation  Additional history obtained: Additional history obtained from police Records reviewed outpatient cardiology and pulmonary records  ED Course and Reassessment: On patient's arrival she is hemodynamically stable in no acute distress.  Was placed by an IVC prior to arrival and will be placed on one-to-one observation for suicidal precautions.  Patient's associated abdominal pain will workup including labs and urine, pelvic ultrasound as well as pelvic exam to evaluate for possible bleeding or source of her pain.  The patient declined any pain control at this time.  She will be placed on CIWA protocol for her alcohol use disorder.  Will need a psychiatry evaluation.  Independent labs interpretation:  The following labs were independently interpreted: positive ETOH, acute on chronic hyponatremia  Independent visualization of imaging: - I independently visualized the following imaging with scope of interpretation limited to determining acute life threatening conditions related to emergency care: pelvic US , which revealed no acute disease  Social Determinants of health: ETOH use disorder    Amount and/or Complexity of Data Reviewed Labs: ordered. Radiology: ordered.  Risk OTC drugs. Prescription drug management.          Final Clinical Impression(s) / ED Diagnoses Final diagnoses:  None    Rx / DC Orders ED Discharge Orders     None         Kingsley, Samson Ralph K, DO 03/05/24 2323

## 2024-03-06 ENCOUNTER — Inpatient Hospital Stay
Admission: AD | Admit: 2024-03-06 | Discharge: 2024-03-12 | DRG: 885 | Disposition: A | Discharge status: Home / Self Care | Payer: Self-pay | Source: Intra-hospital | Attending: Psychiatry | Admitting: Psychiatry

## 2024-03-06 ENCOUNTER — Encounter: Payer: Self-pay | Admitting: Psychiatry

## 2024-03-06 DIAGNOSIS — F329 Major depressive disorder, single episode, unspecified: Principal | ICD-10-CM | POA: Diagnosis present

## 2024-03-06 DIAGNOSIS — G8929 Other chronic pain: Secondary | ICD-10-CM | POA: Diagnosis present

## 2024-03-06 DIAGNOSIS — Z808 Family history of malignant neoplasm of other organs or systems: Secondary | ICD-10-CM

## 2024-03-06 DIAGNOSIS — Z7951 Long term (current) use of inhaled steroids: Secondary | ICD-10-CM

## 2024-03-06 DIAGNOSIS — Z79899 Other long term (current) drug therapy: Secondary | ICD-10-CM

## 2024-03-06 DIAGNOSIS — Z880 Allergy status to penicillin: Secondary | ICD-10-CM

## 2024-03-06 DIAGNOSIS — F321 Major depressive disorder, single episode, moderate: Principal | ICD-10-CM | POA: Diagnosis present

## 2024-03-06 DIAGNOSIS — Z8349 Family history of other endocrine, nutritional and metabolic diseases: Secondary | ICD-10-CM

## 2024-03-06 DIAGNOSIS — Z9152 Personal history of nonsuicidal self-harm: Secondary | ICD-10-CM | POA: Diagnosis not present

## 2024-03-06 DIAGNOSIS — R1032 Left lower quadrant pain: Secondary | ICD-10-CM | POA: Diagnosis not present

## 2024-03-06 DIAGNOSIS — Z833 Family history of diabetes mellitus: Secondary | ICD-10-CM

## 2024-03-06 DIAGNOSIS — Z800 Family history of malignant neoplasm of digestive organs: Secondary | ICD-10-CM

## 2024-03-06 DIAGNOSIS — F13239 Sedative, hypnotic or anxiolytic dependence with withdrawal, unspecified: Secondary | ICD-10-CM | POA: Diagnosis not present

## 2024-03-06 DIAGNOSIS — Z560 Unemployment, unspecified: Secondary | ICD-10-CM

## 2024-03-06 DIAGNOSIS — J4489 Other specified chronic obstructive pulmonary disease: Secondary | ICD-10-CM | POA: Diagnosis not present

## 2024-03-06 DIAGNOSIS — Z803 Family history of malignant neoplasm of breast: Secondary | ICD-10-CM | POA: Diagnosis not present

## 2024-03-06 DIAGNOSIS — Z888 Allergy status to other drugs, medicaments and biological substances status: Secondary | ICD-10-CM

## 2024-03-06 DIAGNOSIS — Z91040 Latex allergy status: Secondary | ICD-10-CM | POA: Diagnosis not present

## 2024-03-06 DIAGNOSIS — N938 Other specified abnormal uterine and vaginal bleeding: Secondary | ICD-10-CM | POA: Diagnosis not present

## 2024-03-06 DIAGNOSIS — F1020 Alcohol dependence, uncomplicated: Secondary | ICD-10-CM | POA: Diagnosis present

## 2024-03-06 DIAGNOSIS — M810 Age-related osteoporosis without current pathological fracture: Secondary | ICD-10-CM | POA: Diagnosis not present

## 2024-03-06 DIAGNOSIS — I2510 Atherosclerotic heart disease of native coronary artery without angina pectoris: Secondary | ICD-10-CM | POA: Diagnosis not present

## 2024-03-06 DIAGNOSIS — J45909 Unspecified asthma, uncomplicated: Secondary | ICD-10-CM | POA: Diagnosis not present

## 2024-03-06 DIAGNOSIS — J439 Emphysema, unspecified: Secondary | ICD-10-CM | POA: Diagnosis not present

## 2024-03-06 DIAGNOSIS — Z881 Allergy status to other antibiotic agents status: Secondary | ICD-10-CM

## 2024-03-06 DIAGNOSIS — Z882 Allergy status to sulfonamides status: Secondary | ICD-10-CM

## 2024-03-06 DIAGNOSIS — M797 Fibromyalgia: Secondary | ICD-10-CM | POA: Diagnosis not present

## 2024-03-06 DIAGNOSIS — R45851 Suicidal ideations: Secondary | ICD-10-CM | POA: Diagnosis not present

## 2024-03-06 DIAGNOSIS — Z811 Family history of alcohol abuse and dependence: Secondary | ICD-10-CM | POA: Diagnosis not present

## 2024-03-06 DIAGNOSIS — F17210 Nicotine dependence, cigarettes, uncomplicated: Secondary | ICD-10-CM | POA: Diagnosis not present

## 2024-03-06 DIAGNOSIS — I10 Essential (primary) hypertension: Secondary | ICD-10-CM | POA: Diagnosis present

## 2024-03-06 DIAGNOSIS — M1990 Unspecified osteoarthritis, unspecified site: Secondary | ICD-10-CM | POA: Diagnosis not present

## 2024-03-06 DIAGNOSIS — Z820 Family history of epilepsy and other diseases of the nervous system: Secondary | ICD-10-CM

## 2024-03-06 DIAGNOSIS — Z7902 Long term (current) use of antithrombotics/antiplatelets: Secondary | ICD-10-CM | POA: Diagnosis not present

## 2024-03-06 DIAGNOSIS — F331 Major depressive disorder, recurrent, moderate: Secondary | ICD-10-CM | POA: Diagnosis not present

## 2024-03-06 DIAGNOSIS — F1010 Alcohol abuse, uncomplicated: Secondary | ICD-10-CM | POA: Diagnosis not present

## 2024-03-06 DIAGNOSIS — E785 Hyperlipidemia, unspecified: Secondary | ICD-10-CM | POA: Diagnosis not present

## 2024-03-06 DIAGNOSIS — Z91013 Allergy to seafood: Secondary | ICD-10-CM

## 2024-03-06 DIAGNOSIS — Z8619 Personal history of other infectious and parasitic diseases: Secondary | ICD-10-CM

## 2024-03-06 DIAGNOSIS — Z7982 Long term (current) use of aspirin: Secondary | ICD-10-CM | POA: Diagnosis not present

## 2024-03-06 DIAGNOSIS — Z836 Family history of other diseases of the respiratory system: Secondary | ICD-10-CM

## 2024-03-06 DIAGNOSIS — Z8249 Family history of ischemic heart disease and other diseases of the circulatory system: Secondary | ICD-10-CM | POA: Diagnosis not present

## 2024-03-06 DIAGNOSIS — F10930 Alcohol use, unspecified with withdrawal, uncomplicated: Secondary | ICD-10-CM | POA: Diagnosis not present

## 2024-03-06 LAB — COMPREHENSIVE METABOLIC PANEL WITH GFR
ALT: 31 U/L (ref 0–44)
AST: 34 U/L (ref 15–41)
Albumin: 4.3 g/dL (ref 3.5–5.0)
Alkaline Phosphatase: 63 U/L (ref 38–126)
Anion gap: 12 (ref 5–15)
BUN: 9 mg/dL (ref 8–23)
CO2: 24 mmol/L (ref 22–32)
Calcium: 10 mg/dL (ref 8.9–10.3)
Chloride: 98 mmol/L (ref 98–111)
Creatinine, Ser: 0.67 mg/dL (ref 0.44–1.00)
GFR, Estimated: >60 mL/min (ref >60)
Glucose, Bld: 99 mg/dL (ref 70–99)
Potassium: 4.3 mmol/L (ref 3.5–5.1)
Sodium: 134 mmol/L — ABNORMAL LOW (ref 135–145)
Total Bilirubin: 0.3 mg/dL (ref 0.0–1.2)
Total Protein: 7.3 g/dL (ref 6.5–8.1)

## 2024-03-06 LAB — RESP PANEL BY RT-PCR (RSV, FLU A&B, COVID)  RVPGX2
Influenza A by PCR: NEGATIVE
Influenza B by PCR: NEGATIVE
Resp Syncytial Virus by PCR: NEGATIVE
SARS Coronavirus 2 by RT PCR: NEGATIVE

## 2024-03-06 MED ORDER — THIAMINE HCL 100 MG/ML IJ SOLN: 100.0000 mg | Freq: Every day | INTRAMUSCULAR | Status: DC

## 2024-03-06 MED ORDER — SODIUM CHLORIDE 1 G PO TABS
1.0000 g | ORAL_TABLET | Freq: Two times a day (BID) | ORAL | Status: DC
  Administered 2024-03-06: 1 g via ORAL
  Filled 2024-03-06 (×2): qty 1

## 2024-03-06 MED ORDER — NICOTINE POLACRILEX 2 MG MT GUM
2.0000 mg | CHEWING_GUM | OROMUCOSAL | Status: DC | PRN
  Administered 2024-03-07 – 2024-03-12 (×7): 2 mg via ORAL
  Filled 2024-03-06 (×8): qty 1

## 2024-03-06 MED ORDER — ACETAMINOPHEN 325 MG PO TABS: 650.0000 mg | ORAL_TABLET | Freq: Four times a day (QID) | ORAL | Status: DC | PRN

## 2024-03-06 MED ORDER — ADULT MULTIVITAMIN W/MINERALS CH
1.0000 | ORAL_TABLET | Freq: Every day | ORAL | Status: DC
  Administered 2024-03-07 – 2024-03-12 (×6): 1 via ORAL
  Filled 2024-03-06 (×6): qty 1

## 2024-03-06 MED ORDER — LORAZEPAM 1 MG PO TABS
1.0000 mg | ORAL_TABLET | ORAL | Status: DC | PRN
  Administered 2024-03-06 – 2024-03-08 (×2): 2 mg via ORAL
  Administered 2024-03-08: 1 mg via ORAL
  Filled 2024-03-06: qty 1
  Filled 2024-03-06 (×2): qty 2

## 2024-03-06 MED ORDER — LORAZEPAM 2 MG/ML IJ SOLN: 1.0000 mg | INTRAMUSCULAR | Status: DC | PRN

## 2024-03-06 MED ORDER — OLANZAPINE 10 MG IM SOLR: 5.0000 mg | Freq: Three times a day (TID) | INTRAMUSCULAR | Status: DC | PRN

## 2024-03-06 MED ORDER — THIAMINE MONONITRATE 100 MG PO TABS
100.0000 mg | ORAL_TABLET | Freq: Every day | ORAL | Status: DC
  Administered 2024-03-07 – 2024-03-12 (×6): 100 mg via ORAL
  Filled 2024-03-06 (×6): qty 1

## 2024-03-06 MED ORDER — HYDROXYZINE HCL 25 MG PO TABS
25.0000 mg | ORAL_TABLET | Freq: Three times a day (TID) | ORAL | Status: DC | PRN
  Administered 2024-03-07 – 2024-03-08 (×3): 25 mg via ORAL
  Filled 2024-03-06 (×3): qty 1

## 2024-03-06 MED ORDER — ALUM & MAG HYDROXIDE-SIMETH 200-200-20 MG/5ML PO SUSP: 30.0000 mL | ORAL | Status: DC | PRN

## 2024-03-06 MED ORDER — SODIUM CHLORIDE 1 G PO TABS
1.0000 g | ORAL_TABLET | Freq: Two times a day (BID) | ORAL | Status: DC
  Administered 2024-03-06 – 2024-03-12 (×12): 1 g via ORAL
  Filled 2024-03-06 (×13): qty 1

## 2024-03-06 MED ORDER — MAGNESIUM HYDROXIDE 400 MG/5ML PO SUSP: 30.0000 mL | Freq: Every day | ORAL | Status: DC | PRN

## 2024-03-06 MED ORDER — FOLIC ACID 1 MG PO TABS
1.0000 mg | ORAL_TABLET | Freq: Every day | ORAL | Status: DC
  Administered 2024-03-07 – 2024-03-12 (×6): 1 mg via ORAL
  Filled 2024-03-06 (×6): qty 1

## 2024-03-06 MED ORDER — OLANZAPINE 5 MG PO TBDP: 5.0000 mg | ORAL_TABLET | Freq: Three times a day (TID) | ORAL | Status: DC | PRN

## 2024-03-06 NOTE — Progress Notes (Signed)
   03/06/24 1500  Psych Admission Type (Psych Patients Only)  Admission Status Involuntary  Psychosocial Assessment  Patient Complaints Anxiety  Eye Contact Brief  Facial Expression Anxious  Affect Anxious  Speech Logical/coherent  Interaction Needy  Motor Activity Slow  Appearance/Hygiene Disheveled  Behavior Characteristics Cooperative;Anxious  Mood Anxious  Thought Process  Coherency WDL  Content Blaming others;Blaming self  Delusions None reported or observed  Perception WDL  Hallucination None reported or observed  Judgment WDL  Confusion None  Danger to Self  Current suicidal ideation? Denies  Danger to Others  Danger to Others None reported or observed

## 2024-03-06 NOTE — ED Notes (Signed)
 Called Sheriff's office for transportation, left a voice message at 1137 am today

## 2024-03-06 NOTE — ED Provider Notes (Signed)
 I assumed care of this patient from previous provider.  Please see their note for further details of history, exam, and MDM.   Briefly patient is a 63 y.o. female who presented under IVC for suicidal ideations.  Further exam paperwork was completed.  Patient was also complaining of lower abdominal pain.  Initial workup reassuring.  Currently awaiting UA and CT scans.  Both of these are reassuring.  Patient has history of chronic hyponatremia.  Close to her baseline.  Salt tabs ordered.  She does not require admission for management of this.  She is medically cleared for behavioral health evaluation and disposition.      Lindle Rhea, MD 03/06/24 [phone]

## 2024-03-06 NOTE — ED Notes (Addendum)
 Called sheriff about transportation left message awaiting call back at [phone]

## 2024-03-06 NOTE — Plan of Care (Signed)
  Problem: Education: Goal: Emotional status will improve Outcome: Progressing Goal: Mental status will improve Outcome: Progressing   Problem: Education: Goal: Utilization of techniques to improve thought processes will improve Outcome: Not Progressing Goal: Knowledge of the prescribed therapeutic regimen will improve Outcome: Not Progressing

## 2024-03-06 NOTE — ED Notes (Signed)
 Gave report to Melody in [place], [place]

## 2024-03-06 NOTE — ED Notes (Signed)
 Patient is ambulatory and performs ADLs independently at home.

## 2024-03-06 NOTE — Progress Notes (Signed)
 Patient admitted IVC to [facility] from [facility] with diagnosis of MDD and AUD. Patient states she has ben trying to get into rehab for a while now but hasn't had any luck since she uses oxygen . She reports "I started drinking around 3.5 years ago when my husband passed, I was drunk the other night when I threatened to run into traffic, but I would never do that if I wanted to die I have a bunch of pills I could take". Patient presents to unit ambulatory A&Ox4. Patient's affect is anxious, speech is rapid and thoughts are organized. Patient endorses anxiety and denies depression. Patient currently denies suicidal ideations, homicidal ideations, audio or visual hallucinations and verbally contracts for safety on unit.  Patient denies pain at this time. Denies incontinence and reports last BM 03/04/24. Patient reports smoking cigarettes. Patient reports living alone with family and neighbors for support.   Emotional support and reassurance provided throughout admission intake. Afterwards, oriented patient to unit, room and call light, reviewed POC with all questions answered and understanding verbalzied. Ativan  and sodium given as ordered. Pt compliant with all meds, swallows without difficulty and consumed dinner in the dayroom. Placed pt on high risk fall precautions per policy and she denies needing a walker for ambulation.  Denies any needs at this time. Will continue to monitor with ongoing Q 15 minute safety checks per unit protocol.

## 2024-03-06 NOTE — Progress Notes (Signed)
 B[facility]/BMU LCSW Progress Note   03/06/2024    10:51 AM  [place]   [id number]   Type of Contact and Topic:  Psychiatric Bed Placement   Pt accepted to [facility] GERO-Psych L31    Patient meets inpatient criteria per Chandra Come, PMHNP   The attending provider will be Dr. Margurette Shillings  Call report to [phone]  Marino Sias, RN @ [facility] ED notified.     Pt scheduled  to arrive at [facility] TODAY.    Phares Brasher, MSW, LCSW-A  10:52 AM 03/06/2024

## 2024-03-06 NOTE — ED Notes (Signed)
 Gave report To floor nurse, Whitney, RN, at [facility].
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
## 2024-03-06 NOTE — Tx Team (Signed)
 Initial Treatment Plan 03/06/2024 3:14 PM Sarah Reeves QIH:[id number]    PATIENT STRESSORS: Substance abuse     PATIENT STRENGTHS: Communication skills  Supportive family/friends    PATIENT IDENTIFIED PROBLEMS:   "I want to go to rehab."  "I made a mistake and said that I would run into traffic, but I really do not want to kill myself.                 DISCHARGE CRITERIA:  Ability to meet basic life and health needs Adequate post-discharge living arrangements Improved stabilization in mood, thinking, and/or behavior Safe-care adequate arrangements made  PRELIMINARY DISCHARGE PLAN: Attend aftercare/continuing care group Return to previous living arrangement  PATIENT/FAMILY INVOLVEMENT: This treatment plan has been presented to and reviewed with the patient, Sarah Reeves. The patient has been given the opportunity to ask questions and make suggestions.  Lestine Rathke, RN 03/06/2024, 3:14 PM

## 2024-03-06 NOTE — ED Notes (Signed)
 Placed patient belongings in locker 29 in [place]

## 2024-03-06 NOTE — Progress Notes (Signed)
 TOC CSW received consult for substance use resources. Resources attached to pt's AVS. TOC sign off.    Arta Lark.Yuri Fana, MSW, LCSW [facility]  Transitions of Care Clinical Social Worker I Direct Dial: [phone]  Fax: [phone] [email or web address]

## 2024-03-07 DIAGNOSIS — F1020 Alcohol dependence, uncomplicated: Secondary | ICD-10-CM | POA: Diagnosis not present

## 2024-03-07 DIAGNOSIS — F321 Major depressive disorder, single episode, moderate: Secondary | ICD-10-CM | POA: Diagnosis not present

## 2024-03-07 MED ORDER — FLUTICASONE FUROATE-VILANTEROL 200-25 MCG/ACT IN AEPB
1.0000 | INHALATION_SPRAY | Freq: Every day | RESPIRATORY_TRACT | Status: DC
  Administered 2024-03-07 – 2024-03-10 (×4): 1 via RESPIRATORY_TRACT
  Filled 2024-03-07: qty 28

## 2024-03-07 MED ORDER — LEVALBUTEROL HCL 0.63 MG/3ML IN NEBU: 0.6300 mg | INHALATION_SOLUTION | Freq: Three times a day (TID) | RESPIRATORY_TRACT | Status: DC | PRN

## 2024-03-07 MED ORDER — ROSUVASTATIN CALCIUM 10 MG PO TABS
10.0000 mg | ORAL_TABLET | Freq: Every day | ORAL | Status: DC
  Filled 2024-03-07 (×3): qty 1

## 2024-03-07 NOTE — Progress Notes (Signed)
 Patient is an involuntary admission to [facility] for MDD with etoh abuse seeking detox. Patient denies SI, HI, AVH and depression but states she has history of anxiety.  When asked to give a number for her anxiety she states "its not like that, I'm just upset with my brother right now".  Patient was up to the desk a lot mostly because she wants to be discharged.  She is redirectable.  CIWA has been 3 for both assessments today. Medicated with atarax  at patient's request for anxiety medication. States she is going to move to [place]  with her brother when she is discharged because she is lonely up her on her own.  Refused her crestor  this evening stating she has never taken it d/t her drinking and possible side affects. Will continue to monitor.

## 2024-03-07 NOTE — Group Note (Signed)
 Date:  03/07/2024 Time:  12:38 AM  Group Topic/Focus:  Wrap-Up Group:   The focus of this group is to help patients review their daily goal of treatment and discuss progress on daily workbooks.    Participation Level:  Minimal  Participation Quality:  Appropriate and Attentive  Affect:  Appropriate  Cognitive:  Alert  Insight: Appropriate  Engagement in Group:  Limited  Modes of Intervention:  Discussion  Additional Comments:     [name],Sarah Reeves 03/07/2024, 12:38 AM

## 2024-03-07 NOTE — Plan of Care (Signed)
  Problem: Education: Goal: Knowledge of [facility] Education information/materials will improve Outcome: Progressing Goal: Emotional status will improve Outcome: Progressing Goal: Mental status will improve Outcome: Progressing Goal: Verbalization of understanding the information provided will improve Outcome: Progressing   Problem: Coping: Goal: Ability to verbalize frustrations and anger appropriately will improve Outcome: Progressing Goal: Ability to demonstrate self-control will improve Outcome: Progressing   Problem: Safety: Goal: Periods of time without injury will increase Outcome: Progressing   

## 2024-03-07 NOTE — Progress Notes (Deleted)
 Cardiology Clinic Note   Patient Name: Sarah Reeves Date of Encounter: 03/07/2024  Primary Care Provider:  Yvonnie Heritage, NP Primary Cardiologist:  Randene Bustard, MD  Patient Profile    Sarah Reeves 63 year old female presents to the clinic today for follow-up evaluation of her coronary artery disease and hypertension.  Past Medical History    Past Medical History:  Diagnosis Date   Allergy     Angio-edema    Anxiety    Arthritis    Asthma    Clostridioides difficile infection    Complication of anesthesia    takes alot to sedated   COPD (chronic obstructive pulmonary disease) (HCC)    Depression    Emphysema of lung (HCC)    Fibromyalgia    GERD (gastroesophageal reflux disease)    Hepatitis C    (Dr. Andriette Keeling) Treated with Harvoni March-May 2016   Herpes simplex    Hyperlipemia    Hypertension    Insomnia    Mental disorder    Neuropathy    Osteoporosis    Oxygen  deficiency    PONV (postoperative nausea and vomiting)    Sleep apnea    Substance abuse (HCC)    Urticaria    Past Surgical History:  Procedure Laterality Date   AUGMENTATION MAMMAPLASTY Bilateral    BREAST BIOPSY Left 11/2014   BREAST BIOPSY Left 12/2014   BREAST ENHANCEMENT SURGERY     BREAST LUMPECTOMY WITH RADIOACTIVE SEED LOCALIZATION Left 12/26/2014   Procedure: LEFT BREAST LUMPECTOMY WITH RADIOACTIVE SEED LOCALIZATION;  Surgeon: Lillette Reid III, MD;  Location: [facility];  Service: General;  Laterality: Left;   BREAST REDUCTION SURGERY     COLONOSCOPY     CT CTA CORONARY W/CA SCORE W/CM &/OR WO/CM  01/19/2023   CAC score 0.  Noncalcified ostial large D1 plaque (50 to 69%-CT FFR negative/nonobstructive).  Otherwise minimal noncalcified plaque in the proximal LAD and proximal RCA.   EXPLORATORY LAPAROTOMY     NM MYOVIEW  LTD  07/07/2015   Normal Myocardial Perfusion Scan. Low risk lexiscan  nuclear study with minimal insignificant breast attenuation and normal myocardial  perfusion and function; EF 53% without wall motion abnormalities and normal systolic thickening   TEMPOROMANDIBULAR JOINT SURGERY     TRANSTHORACIC ECHOCARDIOGRAM  03/11/2023   Normal LV size and function.  EF 60 to 65% with no RWMA.  GR 1 DD.  Normal RV size and function.  Normal RVP and RAP.  Moderate MAC but no MS or MR.   Normal AoV.   TUBAL LIGATION     UPPER GI ENDOSCOPY     x5    Allergies  Allergies  Allergen Reactions   Keflex  [Cephalexin ] Hives, Itching and Other (See Comments)    Tongue and throat itching/tingling   Shellfish Allergy  Other (See Comments)    Unknown reaction; "MD told me I was allergic"   Vancomycin  Anaphylaxis    Face and tongue swells   Zoloft  [Sertraline  Hcl] Other (See Comments)    REACTION: Nightmares, Grind teeth really bad   Latex Itching    Itch and blisters from contact   Amitriptyline     Hot, itching, tongue tingling, breakthrough bleeding.   Clindamycin/Lincomycin Hives and Other (See Comments)    Stated that this causes her to get really horse and made her feel really hot   Other Other (See Comments)   Penicillins    Pentosan Polysulfate Sodium Swelling    Throat swelling   Shellfish-Derived Products Other (  See Comments)   Sulfa Antibiotics Other (See Comments)   Topamax [Topiramate]     Says it makes her "constipation and she can't urinate normally"   Uribel [Meth-Hyo-M Bl-Na Phos-Ph Sal] Nausea Only   Uribel [Urelle] Swelling and Other (See Comments)    Patient stated this makes her stomach swell and makes her feel si   Lamotrigine Rash   Sulfamethoxazole-Trimethoprim Rash    Patient developed feelings of dizziness, rash and itchy hands.  Patient then vomited and had diarrhea.    History of Present Illness    Sarah Reeves has a PMH of COPD, hypertension, depression, emphysema, fibromyalgia, GERD, sleep apnea, and substance abuse.  She presented to the emergency department on 02/04/2024 and was discharged on 02/06/2024.  She  presented via EMS from home.  She reported blood pressures in the 170s-200 systolic.  She had taken 3 nitroglycerin  and 325 of aspirin .  She noted mild chest and upper epigastric pain.  She reported that the pain had resolved and then come back.  She did note that she had vodka that day and she has a history of EtOH dependence.  At the time of exam she had no pain.  She denied urinary symptoms.  She had no lower extremity swelling and no recent travel/surgeries or hospitalizations.  She presents to the clinic today for follow-up evaluation and states***.  *** denies chest pain, shortness of breath, lower extremity edema, fatigue, palpitations, melena, hematuria, hemoptysis, diaphoresis, weakness, presyncope, syncope, orthopnea, and PND.  Essential hypertension-BP today***. Maintain blood pressure log Heart healthy low-sodium diet Continue olmesartan   Hyperlipidemia-LDL***. High-fiber diet Continue aspirin , rosuvastatin   OSA-reports compliance with CPAP.  Waking up well rested. Continue CPAP use  Disposition: Follow-up with Dr. Addie Holstein or me in 3-4 months.  Home Medications    Prior to Admission medications   Medication Sig Start Date End Date Taking? Authorizing Provider  Ascorbic Acid  (VITAMIN C ) 1000 MG tablet Take 1,000 mg by mouth daily. Patient not taking: Reported on 03/06/2024    [provider]  aspirin  EC 81 MG tablet Take 1 tablet (81 mg total) by mouth daily. Patient not taking: Reported on 03/06/2024 08/04/18   Arleen Lacer, MD  budesonide -formoterol  (SYMBICORT ) 160-4.5 MCG/ACT inhaler Inhale 2 puffs into the lungs 2 (two) times daily. 02/12/24   Parrett, Macdonald Savoy, NP  cetirizine  (ZYRTEC ) 10 MG tablet Take 1 tablet (10 mg total) by mouth daily. Patient taking differently: Take 10 mg by mouth daily as needed for allergies. 09/09/22   Ardie Kras, FNP  cilostazol  (PLETAL ) 50 MG tablet Take 1 tablet (50 mg total) by mouth 2 (two) times daily. Patient not taking:  Reported on 03/06/2024 10/07/23   Avanell Leigh, MD  cyclobenzaprine  (FLEXERIL ) 10 MG tablet Take 10 mg by mouth 3 (three) times daily as needed for muscle spasms.    [provider]  DULoxetine  (CYMBALTA ) 30 MG capsule Take 30 mg by mouth daily. Patient not taking: Reported on 03/06/2024 07/31/18   [provider]  EPINEPHrine  (AUVI-Q ) 0.1 MG/0.1ML SOAJ Inject into the muscle. 08/18/18   [provider]  gabapentin  (NEURONTIN ) 300 MG capsule Take 300 mg by mouth daily as needed (nerve pain). 11/29/19   [provider]  levalbuterol  (XOPENEX  HFA) 45 MCG/ACT inhaler INHALE 2 PUFFS INTO THE LUNGS EVERY 4 HOURS AS NEEDED FOR WHEEZING 02/07/23   Parrett, Tammy S, NP  levalbuterol  (XOPENEX ) 1.25 MG/3ML nebulizer solution Take 1.25 mg by nebulization every 4 (four) hours as  needed for wheezing. 04/25/23   Margaretann Sharper, MD  LORazepam  (ATIVAN ) 2 MG tablet Take 2 mg by mouth in the morning, at noon, and at bedtime. 12/17/19   [provider]  methocarbamol (ROBAXIN) 750 MG tablet Take 750 mg by mouth 3 (three) times daily. 12/09/23   [provider]  Na Sulfate-K Sulfate-Mg Sulf 17.5-3.13-1.6 GM/177ML SOLN Take 1 kit by mouth once. 05/02/23   [provider]  nitroGLYCERIN  (NITROSTAT ) 0.4 MG SL tablet Place 1 tablet (0.4 mg total) under the tongue every 5 (five) minutes as needed for chest pain. 10/07/23 03/06/24  Arleen Lacer, MD  olmesartan  (BENICAR ) 40 MG tablet Take 1 tablet (40 mg total) by mouth daily. 02/26/24   Jude Norton, NP  promethazine  (PHENERGAN ) 25 MG tablet Take 25 mg by mouth every 4 (four) hours as needed. 01/21/24   [provider]  rosuvastatin  (CRESTOR ) 10 MG tablet Take 1 tablet (10 mg total) by mouth daily. Patient not taking: Reported on 03/06/2024 07/23/23 10/21/23  Arleen Lacer, MD  valACYclovir  (VALTREX ) 500 MG tablet Take 500 mg by mouth daily as needed (outbreaks). 11/16/19   [provider]   Vitamin D , Cholecalciferol , 25 MCG (1000 UT) TABS Take 25 mcg by mouth daily. Patient not taking: Reported on 03/06/2024 11/27/22   Tania Familia, NP  zolpidem  (AMBIEN ) 10 MG tablet Take 10 mg by mouth at bedtime as needed for sleep. 03/20/21   [provider]    Family History    Family History  Problem Relation Age of Onset   Diabetes Mother    Lung disease Mother    Heart disease Mother    Breast cancer Mother    Heart disease Father    Colon cancer Father    Hypothyroidism Brother    Alzheimer's disease Maternal Grandmother    Alzheimer's disease Maternal Aunt    Skin cancer Maternal Aunt    Stomach cancer Neg Hx    Esophageal cancer Neg Hx    She indicated that her mother is deceased. She indicated that her father is deceased. She indicated that her sister is alive. She indicated that her brother is alive. She indicated that the status of her maternal grandmother is unknown. She indicated that the status of her maternal aunt is unknown. She indicated that the status of her neg hx is unknown.  Social History    Social History   Socioeconomic History   Marital status: Married    Spouse name: Not on file   Number of children: 0   Years of education: 12   Highest education level: Not on file  Occupational History   Occupation: Retired   Occupation: retired  Tobacco Use   Smoking status: Every Day    Current packs/day: 1.00    Average packs/day: 1 pack/day for 47.0 years (47.0 ttl pk-yrs)    Types: Cigarettes    Passive exposure: Current   Smokeless tobacco: Never   Tobacco comments:    Had a bad break up with boyfriend, she has been smoking and drinking.  10 cigarettes/day.  02/10/2024 hfb  Vaping Use   Vaping status: Never Used  Substance and Sexual Activity   Alcohol use: Yes    Alcohol/week: 49.0 standard drinks of alcohol    Types: 49 Shots of liquor per week    Comment: Drinking 5-6 drinks per day (grey goose).  02/10/2024 hfb   Drug use: No    Sexual activity: Yes  Birth control/protection: Surgical  Other Topics Concern   Not on file  Social History Narrative   Patient is right handed.   Four cups caffeine per day.   Lives at home with husband.   Social Drivers of Corporate investment banker Strain: Not on file  Food Insecurity: No Food Insecurity (03/06/2024)   Hunger Vital Sign    Worried About Running Out of Food in the Last Year: Never true    Ran Out of Food in the Last Year: Never true  Transportation Needs: No Transportation Needs (03/06/2024)   PRAPARE - Administrator, Civil Service (Medical): No    Lack of Transportation (Non-Medical): No  Physical Activity: Not on file  Stress: Not on file  Social Connections: Unknown (02/25/2023)   [facility]   Social Network    Social Network: Not on file  Intimate Partner Violence: Not At Risk (03/06/2024)   Humiliation, Afraid, Rape, and Kick questionnaire    Fear of Current or Ex-Partner: No    Emotionally Abused: No    Physically Abused: No    Sexually Abused: No     Review of Systems    General:  No chills, fever, night sweats or weight changes.  Cardiovascular:  No chest pain, dyspnea on exertion, edema, orthopnea, palpitations, paroxysmal nocturnal dyspnea. Dermatological: No rash, lesions/masses Respiratory: No cough, dyspnea Urologic: No hematuria, dysuria Abdominal:   No nausea, vomiting, diarrhea, bright red blood per rectum, melena, or hematemesis Neurologic:  No visual changes, wkns, changes in mental status. All other systems reviewed and are otherwise negative except as noted above.  Physical Exam    VS:  There were no vitals taken for this visit. , BMI There is no height or weight on file to calculate BMI. GEN: Well nourished, well developed, in no acute distress. HEENT: normal. Neck: Supple, no JVD, carotid bruits, or masses. Cardiac: RRR, no murmurs, rubs, or gallops. No clubbing, cyanosis, edema.   Radials/DP/PT 2+ and equal bilaterally.  Respiratory:  Respirations regular and unlabored, clear to auscultation bilaterally. GI: Soft, nontender, nondistended, BS + x 4. MS: no deformity or atrophy. Skin: warm and dry, no rash. Neuro:  Strength and sensation are intact. Psych: Normal affect.  Accessory Clinical Findings    Recent Labs: 03/05/2024: Hemoglobin 13.9; Platelets 204 03/06/2024: ALT 31; BUN 9; Creatinine, Ser 0.67; Potassium 4.3; Sodium 134   Recent Lipid Panel    Component Value Date/Time   CHOL 272 (H) 01/13/2024 0812   TRIG 84 01/13/2024 0812   HDL 132 01/13/2024 0812   CHOLHDL 2.1 01/13/2024 0812   CHOLHDL 3.3 06/27/2018 0623   VLDL 23 06/27/2018 0623   LDLCALC 127 (H) 01/13/2024 0812         ECG personally reviewed by me today- ***     Echocardiogram 03/11/2023   IMPRESSIONS     1. Left ventricular ejection fraction, by estimation, is 60 to 65%. The  left ventricle has normal function. The left ventricle has no regional  wall motion abnormalities. Left ventricular diastolic parameters are  consistent with Grade I diastolic  dysfunction (impaired relaxation). The average left ventricular global  longitudinal strain is -16.5 %. The global longitudinal strain is normal.   2. Right ventricular systolic function is normal. The right ventricular  size is normal.   3. The mitral valve is normal in structure. No evidence of mitral valve  regurgitation. No evidence of mitral stenosis. Moderate mitral annular  calcification.  4. The aortic valve is tricuspid. Aortic valve regurgitation is not  visualized. No aortic stenosis is present.   5. The inferior vena cava is normal in size with greater than 50%  respiratory variability, suggesting right atrial pressure of 3 mmHg.   FINDINGS   Left Ventricle: Left ventricular ejection fraction, by estimation, is 60  to 65%. The left ventricle has normal function. The left ventricle has no  regional wall motion  abnormalities. The average left ventricular global  longitudinal strain is -16.5 %.  The global longitudinal strain is normal. The left ventricular internal  cavity size was normal in size. There is no left ventricular hypertrophy.  Left ventricular diastolic parameters are consistent with Grade I  diastolic dysfunction (impaired  relaxation).   Right Ventricle: The right ventricular size is normal. No increase in  right ventricular wall thickness. Right ventricular systolic function is  normal.   Left Atrium: Left atrial size was normal in size.   Right Atrium: Right atrial size was normal in size.   Pericardium: There is no evidence of pericardial effusion.   Mitral Valve: The mitral valve is normal in structure. Moderate mitral  annular calcification. No evidence of mitral valve regurgitation. No  evidence of mitral valve stenosis.   Tricuspid Valve: The tricuspid valve is normal in structure. Tricuspid  valve regurgitation is not demonstrated. No evidence of tricuspid  stenosis.   Aortic Valve: The aortic valve is tricuspid. Aortic valve regurgitation is  not visualized. No aortic stenosis is present.   Pulmonic Valve: The pulmonic valve was normal in structure. Pulmonic valve  regurgitation is not visualized. No evidence of pulmonic stenosis.   Aorta: The aortic root is normal in size and structure.   Venous: The inferior vena cava is normal in size with greater than 50%  respiratory variability, suggesting right atrial pressure of 3 mmHg.   IAS/Shunts: No atrial level shunt detected by color flow Doppler.      Assessment & Plan   1.  ***   Chet Cota. Yue Glasheen NP-C     03/07/2024, 12:23 PM [facility] [address] Office [phone] Fax [phone]    I spent***minutes examining this patient, reviewing medications, and using patient centered shared decision making involving their cardiac care.   I spent  20 minutes  reviewing past medical history,  medications, and prior cardiac tests.

## 2024-03-07 NOTE — BHH Suicide Risk Assessment (Signed)
 [facility] Admission Suicide Risk Assessment   Nursing information obtained from:  Patient Demographic factors:  Divorced or widowed, Living alone Current Mental Status:  NA Loss Factors:  NA Historical Factors:  NA Risk Reduction Factors:  NA  Total Time spent with patient: 30 minutes Principal Problem: MDD (major depressive disorder) Diagnosis:  Principal Problem:   MDD (major depressive disorder)  Subjective Data: Sarah Reeves is a 63 year old female who presents to [facility] escorted by [employer] under IVC. Patient has a reported history of MDD,GAD, and Alcohol abuse.   Per IVC "Respondent stated multiple times that they would kill themselves. Respondent stated she was going to run into traffic. Respondent is currently experiencing a medical emergency confirmed by EMS. Respondent is refusing to seek treatment. Respondent is a danger to self. Respondent is a danger to others."   Continued Clinical Symptoms:  Alcohol Use Disorder Identification Test Final Score (AUDIT): 4 The "Alcohol Use Disorders Identification Test", Guidelines for Use in Primary Care, Second Edition.  World Science writer [facility]). Score between 0-7:  no or low risk or alcohol related problems. Score between 8-15:  moderate risk of alcohol related problems. Score between 16-19:  high risk of alcohol related problems. Score 20 or above:  warrants further diagnostic evaluation for alcohol dependence and treatment.   CLINICAL FACTORS:   Depression:   Comorbid alcohol abuse/dependence   Musculoskeletal: Strength & Muscle Tone: within normal limits Gait & Station: normal Patient leans: N/A  Psychiatric Specialty Exam:  Presentation  General Appearance:  Appropriate for Environment; Casual  Eye Contact: Fair  Speech: Clear and Coherent  Speech Volume: Normal  Handedness: Right   Mood and Affect  Mood: Anxious  Affect: Appropriate   Thought Process  Thought Processes: Coherent  Descriptions of  Associations:Intact  Orientation:Full (Time, Place and Person)  Thought Content:Illogical  History of Schizophrenia/Schizoaffective disorder:No  Duration of Psychotic Symptoms:No data recorded Hallucinations:Hallucinations: None  Ideas of Reference:None  Suicidal Thoughts:Suicidal Thoughts: No  Homicidal Thoughts:Homicidal Thoughts: No   Sensorium  Memory: Immediate Fair; Recent Fair; Remote Poor  Judgment: Impaired  Insight: Shallow   Executive Functions  Concentration: Poor  Attention Span: Poor  Recall: [employer] of Knowledge: Fair  Language: Fair   Psychomotor Activity  Psychomotor Activity: Psychomotor Activity: Normal   Assets  Assets: Communication Skills; Financial Resources/Insurance; Social Support; Resilience   Sleep  Sleep: Sleep: Fair    Physical Exam: Physical Exam ROS Blood pressure (!) 126/46, pulse 78, temperature 98.2 F (36.8 C), resp. rate 18, height 5\' 3"  (1.6 m), weight 62.8 kg, SpO2 95%. Body mass index is 24.53 kg/m.   COGNITIVE FEATURES THAT CONTRIBUTE TO RISK:  None    SUICIDE RISK:   Minimal: No identifiable suicidal ideation.  Patients presenting with no risk factors but with morbid ruminations; may be classified as minimal risk based on the severity of the depressive symptoms  PLAN OF CARE: .sj  I certify that inpatient services furnished can reasonably be expected to improve the patient's condition.   Aurelia Blotter, MD 03/07/2024, 5:09 PM

## 2024-03-07 NOTE — Progress Notes (Signed)
 Patient alert and oriented. Endorses anxiety and depression. Denies SI, HI, AVH, and pain. Support and encouragement provided.  Routine safety checks conducted every 15 minutes.  Patient informed to notify staff with problems or concerns. Patient remains safe at this time.

## 2024-03-07 NOTE — Progress Notes (Signed)
   03/06/24 2100  Psych Admission Type (Psych Patients Only)  Admission Status Involuntary  Psychosocial Assessment  Patient Complaints Anxiety  Eye Contact Brief  Facial Expression Anxious  Affect Anxious  Speech Logical/coherent  Interaction Needy  Motor Activity Slow  Appearance/Hygiene In scrubs  Behavior Characteristics Anxious  Mood Anxious  Thought Process  Coherency WDL  Content Blaming self  Delusions None reported or observed  Perception WDL  Hallucination None reported or observed  Judgment WDL  Confusion None  Danger to Self  Current suicidal ideation? Denies

## 2024-03-07 NOTE — H&P (Signed)
 Psychiatric Admission Assessment Adult  Patient Identification: Sarah Reeves MRN:  [id number] Date of Evaluation:  03/07/2024 Chief Complaint:  MDD (major depressive disorder) [F32.9]   History of Present Illness: Sarah Reeves is a 63 year old female who presents to [facility] escorted by [employer] under IVC. Patient has a reported history of MDD,GAD, and Alcohol abuse.   Per IVC "Respondent stated multiple times that they would kill themselves. Respondent stated she was going to run into traffic. Respondent is currently experiencing a medical emergency confirmed by EMS. Respondent is refusing to seek treatment. Respondent is a danger to self. Respondent is a danger to others."   Today on assessment patient reports that her husband of 35 years passed away in her arms 3 and half years ago and she has been grieving it.  She did acknowledge increased use of alcohol in the last few years and in the last few months drinking heavily every day.  He talks about other guy becoming friends and trying to help her to get to a detox program.  She reports that last Friday she was hanging out with her friends and they had shots of tequila.  She reports she got intoxicated and reportedly called the guy and told him "that if she does not get into the detox program she will run into traffic ".  Patient denies having any memory of the statements but reports this is what was told to her by her friend.  The friend called cops and other friends came home and stayed with her until ambulance came and brought her to the emergency room.  Patient reports that she has been drinking alcohol heavily and usually takes Ativan  3 times per day for anxiety.  She gives very vague responses related to depression and reports she was just worried about a friend and denies feeling hopeless or worthless.  She reports being isolated to given heavy alcohol use.  She reports fair sleep with poor appetite, poor energy and motivation.  She denies  suicidal/homicidal ideation/intent/plan.  She reports that her mood has gone down in the last few months since she was in chronic pain due to fibromyalgia and diagnosed with C. difficile.  She denies current or recent episodes of mania/hypomania.  She reports history of sexual hallucinations years ago but denies any ongoing night or flashbacks.  She denies auditory/visual hallucinations.  She has panic attacks intermittently.  Total Time spent with patient: 1 hour Sleep  Sleep:Sleep: Fair  Past Psychiatric History:  Psychiatric History:  Information collected from patient and chart  Prev Dx/Sx: Anxiety Current Psych Provider: None reported Home Meds (current): None currently Previous Med Trials: SSRIs causes teeth grinding, Cymbalta  weight gain Therapy: None reported  Prior Psych Hospitalization: Many years ago at [facility] Prior Self Harm: At age 89 Prior Violence: None reported  Family Psych History: Anxiety and alcohol use and bio father Family Hx suicide: None reported  Social History:  Developmental Hx: Normal Educational Hx: 11th grade Occupational Hx: Unemployed, getting husband's SSI Legal Hx: None reported Living Situation: Lives by herself, has a stepson Spiritual Hx: Denies Access to weapons/lethal means: Denies  Substance History Alcohol: For the last few years followed by heavy drinking in the last few months Type of alcohol vodka Last Drink at the day of ED visit Number of drinks per day 1-2 drinks History of alcohol withdrawal seizures denies History of DT's denies Tobacco: Half pack per day Illicit drugs: Denies Prescription drug abuse: Denies Rehab hx: Denies  Is the patient at risk to self? No.  Has the patient been a risk to self in the past 6 months? No.  Has the patient been a risk to self within the distant past? No.  Is the patient a risk to others? No.  Has the patient been a risk to others in the past 6 months? No.  Has the  patient been a risk to others within the distant past? No.   [place] Scale:  Flowsheet Row Admission (Current) from 03/06/2024 in [facility] ED from 03/05/2024 in [facility] Emergency Department at [facility] ED from 02/26/2024 in [facility] Emergency Department at [facility]  C-SSRS RISK CATEGORY No Risk No Risk No Risk        Past Medical History:  Past Medical History:  Diagnosis Date   Allergy     Angio-edema    Anxiety    Arthritis    Asthma    Clostridioides difficile infection    Complication of anesthesia    takes alot to sedated   COPD (chronic obstructive pulmonary disease) (HCC)    Depression    Emphysema of lung (HCC)    Fibromyalgia    GERD (gastroesophageal reflux disease)    Hepatitis C    (Dr. Andriette Keeling) Treated with Harvoni March-May 2016   Herpes simplex    Hyperlipemia    Hypertension    Insomnia    Mental disorder    Neuropathy    Osteoporosis    Oxygen  deficiency    PONV (postoperative nausea and vomiting)    Sleep apnea    Substance abuse (HCC)    Urticaria     Past Surgical History:  Procedure Laterality Date   AUGMENTATION MAMMAPLASTY Bilateral    BREAST BIOPSY Left 11/2014   BREAST BIOPSY Left 12/2014   BREAST ENHANCEMENT SURGERY     BREAST LUMPECTOMY WITH RADIOACTIVE SEED LOCALIZATION Left 12/26/2014   Procedure: LEFT BREAST LUMPECTOMY WITH RADIOACTIVE SEED LOCALIZATION;  Surgeon: Lillette Reid III, MD;  Location: [facility];  Service: General;  Laterality: Left;   BREAST REDUCTION SURGERY     COLONOSCOPY     CT CTA CORONARY W/CA SCORE W/CM &/OR WO/CM  01/19/2023   CAC score 0.  Noncalcified ostial large D1 plaque (50 to 69%-CT FFR negative/nonobstructive).  Otherwise minimal noncalcified plaque in the proximal LAD and proximal RCA.   EXPLORATORY LAPAROTOMY     NM MYOVIEW  LTD  07/07/2015   Normal Myocardial Perfusion Scan. Low risk lexiscan  nuclear study with minimal insignificant breast  attenuation and normal myocardial perfusion and function; EF 53% without wall motion abnormalities and normal systolic thickening   TEMPOROMANDIBULAR JOINT SURGERY     TRANSTHORACIC ECHOCARDIOGRAM  03/11/2023   Normal LV size and function.  EF 60 to 65% with no RWMA.  GR 1 DD.  Normal RV size and function.  Normal RVP and RAP.  Moderate MAC but no MS or MR.   Normal AoV.   TUBAL LIGATION     UPPER GI ENDOSCOPY     x5   Family History:  Family History  Problem Relation Age of Onset   Diabetes Mother    Lung disease Mother    Heart disease Mother    Breast cancer Mother    Heart disease Father    Colon cancer Father    Hypothyroidism Brother    Alzheimer's disease Maternal Grandmother    Alzheimer's disease Maternal Aunt    Skin cancer Maternal Aunt  Stomach cancer Neg Hx    Esophageal cancer Neg Hx     Social History:  Social History   Substance and Sexual Activity  Alcohol Use Yes   Alcohol/week: 49.0 standard drinks of alcohol   Types: 49 Shots of liquor per week   Comment: Drinking 5-6 drinks per day (grey goose).  02/10/2024 hfb     Social History   Substance and Sexual Activity  Drug Use No      Allergies:   Allergies  Allergen Reactions   Keflex  [Cephalexin ] Hives, Itching and Other (See Comments)    Tongue and throat itching/tingling   Shellfish Allergy  Other (See Comments)    Unknown reaction; "MD told me I was allergic"   Vancomycin  Anaphylaxis    Face and tongue swells   Zoloft  [Sertraline  Hcl] Other (See Comments)    REACTION: Nightmares, Grind teeth really bad   Latex Itching    Itch and blisters from contact   Amitriptyline     Hot, itching, tongue tingling, breakthrough bleeding.   Clindamycin/Lincomycin Hives and Other (See Comments)    Stated that this causes her to get really horse and made her feel really hot   Other Other (See Comments)   Penicillins    Pentosan Polysulfate Sodium Swelling    Throat swelling   Shellfish-Derived Products  Other (See Comments)   Sulfa Antibiotics Other (See Comments)   Topamax [Topiramate]     Says it makes her "constipation and she can't urinate normally"   Uribel [Meth-Hyo-M Bl-Na Phos-Ph Sal] Nausea Only   Uribel [Urelle] Swelling and Other (See Comments)    Patient stated this makes her stomach swell and makes her feel si   Lamotrigine Rash   Sulfamethoxazole-Trimethoprim Rash    Patient developed feelings of dizziness, rash and itchy hands.  Patient then vomited and had diarrhea.   Lab Results:  Results for orders placed or performed during the hospital encounter of 03/05/24 (from the past 48 hours)  Comprehensive metabolic panel     Status: Abnormal   Collection Time: 03/05/24  9:43 PM  Result Value Ref Range   Sodium 123 (L) 135 - 145 mmol/L   Potassium 4.3 3.5 - 5.1 mmol/L   Chloride 89 (L) 98 - 111 mmol/L   CO2 23 22 - 32 mmol/L   Glucose, Bld 106 (H) 70 - 99 mg/dL    Comment: Glucose reference range applies only to samples taken after fasting for at least 8 hours.   BUN 7 (L) 8 - 23 mg/dL   Creatinine, Ser 1.47 0.44 - 1.00 mg/dL   Calcium  9.4 8.9 - 10.3 mg/dL   Total Protein 7.5 6.5 - 8.1 g/dL   Albumin 4.5 3.5 - 5.0 g/dL   AST 33 15 - 41 U/L   ALT 30 0 - 44 U/L   Alkaline Phosphatase 68 38 - 126 U/L   Total Bilirubin 0.5 0.0 - 1.2 mg/dL   GFR, Estimated >82 >95 mL/min    Comment: (NOTE) Calculated using the CKD-EPI Creatinine Equation (2021)    Anion gap 11 5 - 15    Comment: Performed at [facility], [address] [place], [place] [address]  Ethanol     Status: Abnormal   Collection Time: 03/05/24  9:43 PM  Result Value Ref Range   Alcohol, Ethyl (B) 282 (H) <15 mg/dL    Comment: Please note change in reference range. (NOTE) For medical purposes only. Performed at [facility], [address] [place], [place] [address]   cbc     Status: None   Collection Time: 03/05/24  9:43 PM  Result Value Ref Range   WBC 9.8 4.0  - 10.5 K/uL   RBC 4.32 3.87 - 5.11 MIL/uL   Hemoglobin 13.9 12.0 - 15.0 g/dL   HCT 84.6 96.2 - 95.2 %   MCV 95.4 80.0 - 100.0 fL   MCH 32.2 26.0 - 34.0 pg   MCHC 33.7 30.0 - 36.0 g/dL   RDW 84.1 32.4 - 40.1 %   Platelets 204 150 - 400 K/uL   nRBC 0.0 0.0 - 0.2 %    Comment: Performed at [facility], [address] [place], [place] [address]  Rapid urine drug screen (hospital performed)     Status: Abnormal   Collection Time: 03/05/24 11:28 PM  Result Value Ref Range   Opiates NONE DETECTED NONE DETECTED   Cocaine NONE DETECTED NONE DETECTED   Benzodiazepines POSITIVE (A) NONE DETECTED   Amphetamines NONE DETECTED NONE DETECTED   Tetrahydrocannabinol NONE DETECTED NONE DETECTED   Barbiturates NONE DETECTED NONE DETECTED    Comment: (NOTE) DRUG SCREEN FOR MEDICAL PURPOSES ONLY.  IF CONFIRMATION IS NEEDED FOR ANY PURPOSE, NOTIFY LAB WITHIN 5 DAYS.  LOWEST DETECTABLE LIMITS FOR URINE DRUG SCREEN Drug Class                     Cutoff (ng/mL) Amphetamine and metabolites    1000 Barbiturate and metabolites    200 Benzodiazepine                 200 Opiates and metabolites        300 Cocaine and metabolites        300 THC                            50 Performed at [facility], [address], [place], [place] [address]   Urinalysis, Routine w reflex microscopic -Urine, Clean Catch     Status: Abnormal   Collection Time: 03/05/24 11:28 PM  Result Value Ref Range   Color, Urine COLORLESS (A) YELLOW   APPearance CLEAR CLEAR   Specific Gravity, Urine 1.002 (L) 1.005 - 1.030   pH 6.0 5.0 - 8.0   Glucose, UA NEGATIVE NEGATIVE mg/dL   Hgb urine dipstick NEGATIVE NEGATIVE   Bilirubin Urine NEGATIVE NEGATIVE   Ketones, ur NEGATIVE NEGATIVE mg/dL   Protein, ur NEGATIVE NEGATIVE mg/dL   Nitrite NEGATIVE NEGATIVE   Leukocytes,Ua NEGATIVE NEGATIVE    Comment: Performed at [facility], [address], [place], [place] [address]   Resp panel by RT-PCR (RSV, Flu A&B, Covid) Anterior Nasal Swab     Status: None   Collection Time: 03/06/24  8:11 AM   Specimen: Anterior Nasal Swab  Result Value Ref Range   SARS Coronavirus 2 by RT PCR NEGATIVE NEGATIVE    Comment: (NOTE) SARS-CoV-2 target nucleic acids are NOT DETECTED.  The SARS-CoV-2 RNA is generally detectable in upper respiratory specimens during the acute phase of infection. The lowest concentration of SARS-CoV-2 viral copies this assay can detect is 138 copies/mL. A negative result does not preclude SARS-Cov-2 infection and should not be used as the sole basis for treatment or other patient management decisions. A negative result may occur with  improper specimen collection/handling, submission of specimen other than nasopharyngeal swab, presence of viral mutation(s) within the areas  targeted by this assay, and inadequate number of viral copies(<138 copies/mL). A negative result must be combined with clinical observations, patient history, and epidemiological information. The expected result is Negative.  Fact Sheet for Patients:  BloggerCourse.com  Fact Sheet for Healthcare Providers:  SeriousBroker.it  This test is no t yet approved or cleared by the [place]  FDA and  has been authorized for detection and/or diagnosis of SARS-CoV-2 by FDA under an Emergency Use Authorization (EUA). This EUA will remain  in effect (meaning this test can be used) for the duration of the COVID-19 declaration under Section 564(b)(1) of the Act, 21 U.S.C.section 360bbb-3(b)(1), unless the authorization is terminated  or revoked sooner.       Influenza A by PCR NEGATIVE NEGATIVE   Influenza B by PCR NEGATIVE NEGATIVE    Comment: (NOTE) The Xpert Xpress SARS-CoV-2/FLU/RSV plus assay is intended as an aid in the diagnosis of influenza from Nasopharyngeal swab specimens and should not be used as a sole basis for  treatment. Nasal washings and aspirates are unacceptable for Xpert Xpress SARS-CoV-2/FLU/RSV testing.  Fact Sheet for Patients: BloggerCourse.com  Fact Sheet for Healthcare Providers: SeriousBroker.it  This test is not yet approved or cleared by the [place]  FDA and has been authorized for detection and/or diagnosis of SARS-CoV-2 by FDA under an Emergency Use Authorization (EUA). This EUA will remain in effect (meaning this test can be used) for the duration of the COVID-19 declaration under Section 564(b)(1) of the Act, 21 U.S.C. section 360bbb-3(b)(1), unless the authorization is terminated or revoked.     Resp Syncytial Virus by PCR NEGATIVE NEGATIVE    Comment: (NOTE) Fact Sheet for Patients: BloggerCourse.com  Fact Sheet for Healthcare Providers: SeriousBroker.it  This test is not yet approved or cleared by the [place]  FDA and has been authorized for detection and/or diagnosis of SARS-CoV-2 by FDA under an Emergency Use Authorization (EUA). This EUA will remain in effect (meaning this test can be used) for the duration of the COVID-19 declaration under Section 564(b)(1) of the Act, 21 U.S.C. section 360bbb-3(b)(1), unless the authorization is terminated or revoked.  Performed at [facility], [address], [place], [place] [address]   Comprehensive metabolic panel     Status: Abnormal   Collection Time: 03/06/24  8:41 AM  Result Value Ref Range   Sodium 134 (L) 135 - 145 mmol/L    Comment: DELTA CHECK NOTED   Potassium 4.3 3.5 - 5.1 mmol/L   Chloride 98 98 - 111 mmol/L   CO2 24 22 - 32 mmol/L   Glucose, Bld 99 70 - 99 mg/dL    Comment: Glucose reference range applies only to samples taken after fasting for at least 8 hours.   BUN 9 8 - 23 mg/dL   Creatinine, Ser 6.04 0.44 - 1.00 mg/dL   Calcium  10.0 8.9 - 10.3 mg/dL   Total Protein  7.3 6.5 - 8.1 g/dL   Albumin 4.3 3.5 - 5.0 g/dL   AST 34 15 - 41 U/L   ALT 31 0 - 44 U/L   Alkaline Phosphatase 63 38 - 126 U/L   Total Bilirubin 0.3 0.0 - 1.2 mg/dL   GFR, Estimated >54 >09 mL/min    Comment: (NOTE) Calculated using the CKD-EPI Creatinine Equation (2021)    Anion gap 12 5 - 15    Comment: Performed at [facility], [address] [place], [place] [address]    Blood Alcohol level:  Lab Results  Component Value Date   ETH 282 (H) 03/05/2024   ETH <10 02/04/2024    Metabolic Disorder Labs:  Lab Results  Component Value Date   HGBA1C 6.0 (H) 03/10/2021   MPG 125.5 03/10/2021   MPG 114.02 06/27/2018   Lab Results  Component Value Date   PROLACTIN 24.0 (H) 06/27/2018   Lab Results  Component Value Date   CHOL 272 (H) 01/13/2024   TRIG 84 01/13/2024   HDL 132 01/13/2024   CHOLHDL 2.1 01/13/2024   VLDL 23 06/27/2018   LDLCALC 127 (H) 01/13/2024   LDLCALC 121 (H) 01/24/2023    Current Medications: Current Facility-Administered Medications  Medication Dose Route Frequency Provider Last Rate Last Admin   acetaminophen  (TYLENOL ) tablet 650 mg  650 mg Oral Q6H PRN Motley-Mangrum, Jadeka A, PMHNP       alum & mag hydroxide-simeth (MAALOX/MYLANTA) 200-200-20 MG/5ML suspension 30 mL  30 mL Oral Q4H PRN Motley-Mangrum, Jadeka A, PMHNP       folic acid  (FOLVITE ) tablet 1 mg  1 mg Oral Daily Motley-Mangrum, Jadeka A, PMHNP   1 mg at 03/07/24 0931   hydrOXYzine  (ATARAX ) tablet 25 mg  25 mg Oral TID PRN Motley-Mangrum, Jadeka A, PMHNP   25 mg at 03/07/24 0931   LORazepam  (ATIVAN ) tablet 1-4 mg  1-4 mg Oral Q1H PRN Motley-Mangrum, Jadeka A, PMHNP   2 mg at 03/06/24 1735   Or   LORazepam  (ATIVAN ) injection 1-4 mg  1-4 mg Intravenous Q1H PRN Motley-Mangrum, Jadeka A, PMHNP       magnesium  hydroxide (MILK OF MAGNESIA) suspension 30 mL  30 mL Oral Daily PRN Motley-Mangrum, Jadeka A, PMHNP       multivitamin with minerals tablet 1 tablet  1 tablet Oral  Daily Motley-Mangrum, Jadeka A, PMHNP   1 tablet at 03/07/24 0930   nicotine  polacrilex (NICORETTE ) gum 2 mg  2 mg Oral PRN Kaylanni Ezelle, MD   2 mg at 03/07/24 1434   OLANZapine  (ZYPREXA ) injection 5 mg  5 mg Intramuscular TID PRN Motley-Mangrum, Jadeka A, PMHNP       OLANZapine  zydis (ZYPREXA ) disintegrating tablet 5 mg  5 mg Oral TID PRN Motley-Mangrum, Jadeka A, PMHNP       sodium chloride  tablet 1 g  1 g Oral BID WC Motley-Mangrum, Jadeka A, PMHNP   1 g at 03/07/24 1651   thiamine  (VITAMIN B1) tablet 100 mg  100 mg Oral Daily Motley-Mangrum, Jadeka A, PMHNP   100 mg at 03/07/24 0930   Or   thiamine  (VITAMIN B1) injection 100 mg  100 mg Intravenous Daily Motley-Mangrum, Jadeka A, PMHNP       Facility-Administered Medications Ordered in Other Encounters  Medication Dose Route Frequency Provider Last Rate Last Admin   chlorhexidine  (HIBICLENS ) 4 % liquid 1 application  1 application  Topical Once Lillette Reid III, MD       chlorhexidine  (HIBICLENS ) 4 % liquid 1 application  1 application  Topical Once Toth, Paul III, MD       PTA Medications: Medications Prior to Admission  Medication Sig Dispense Refill Last Dose/Taking   Ascorbic Acid  (VITAMIN C ) 1000 MG tablet Take 1,000 mg by mouth daily. (Patient not taking: Reported on 03/06/2024)      aspirin  EC 81 MG tablet Take 1 tablet (81 mg total) by mouth daily. (Patient not taking: Reported on 03/06/2024) 90 tablet 3    budesonide -formoterol  (SYMBICORT ) 160-4.5 MCG/ACT inhaler Inhale 2 puffs into the lungs 2 (two) times daily. 1 each  5    cetirizine  (ZYRTEC ) 10 MG tablet Take 1 tablet (10 mg total) by mouth daily. (Patient taking differently: Take 10 mg by mouth daily as needed for allergies.) 30 tablet 5    cilostazol  (PLETAL ) 50 MG tablet Take 1 tablet (50 mg total) by mouth 2 (two) times daily. (Patient not taking: Reported on 03/06/2024) 180 tablet 3    cyclobenzaprine  (FLEXERIL ) 10 MG tablet Take 10 mg by mouth 3 (three) times daily as needed  for muscle spasms.      DULoxetine  (CYMBALTA ) 30 MG capsule Take 30 mg by mouth daily. (Patient not taking: Reported on 03/06/2024)      EPINEPHrine  (AUVI-Q ) 0.1 MG/0.1ML SOAJ Inject into the muscle.      gabapentin  (NEURONTIN ) 300 MG capsule Take 300 mg by mouth daily as needed (nerve pain).      levalbuterol  (XOPENEX  HFA) 45 MCG/ACT inhaler INHALE 2 PUFFS INTO THE LUNGS EVERY 4 HOURS AS NEEDED FOR WHEEZING 15 g 1    levalbuterol  (XOPENEX ) 1.25 MG/3ML nebulizer solution Take 1.25 mg by nebulization every 4 (four) hours as needed for wheezing. 72 mL 5    LORazepam  (ATIVAN ) 2 MG tablet Take 2 mg by mouth in the morning, at noon, and at bedtime.      methocarbamol (ROBAXIN) 750 MG tablet Take 750 mg by mouth 3 (three) times daily.      Na Sulfate-K Sulfate-Mg Sulf 17.5-3.13-1.6 GM/177ML SOLN Take 1 kit by mouth once.      nitroGLYCERIN  (NITROSTAT ) 0.4 MG SL tablet Place 1 tablet (0.4 mg total) under the tongue every 5 (five) minutes as needed for chest pain. 25 tablet 11    olmesartan  (BENICAR ) 40 MG tablet Take 1 tablet (40 mg total) by mouth daily. 30 tablet 6    promethazine  (PHENERGAN ) 25 MG tablet Take 25 mg by mouth every 4 (four) hours as needed.      rosuvastatin  (CRESTOR ) 10 MG tablet Take 1 tablet (10 mg total) by mouth daily. (Patient not taking: Reported on 03/06/2024)      valACYclovir  (VALTREX ) 500 MG tablet Take 500 mg by mouth daily as needed (outbreaks).      Vitamin D , Cholecalciferol , 25 MCG (1000 UT) TABS Take 25 mcg by mouth daily. (Patient not taking: Reported on 03/06/2024) 60 tablet 3    zolpidem  (AMBIEN ) 10 MG tablet Take 10 mg by mouth at bedtime as needed for sleep.       Psychiatric Specialty Exam:  Presentation  General Appearance:  Appropriate for Environment; Casual  Eye Contact: Fair  Speech: Clear and Coherent  Speech Volume: Normal    Mood and Affect  Mood: Anxious  Affect: Appropriate   Thought Process  Thought  Processes: Coherent  Descriptions of Associations:Intact  Orientation:Full (Time, Place and Person)  Thought Content:Illogical  Hallucinations:Hallucinations: None  Ideas of Reference:None  Suicidal Thoughts:Suicidal Thoughts: No  Homicidal Thoughts:Homicidal Thoughts: No   Sensorium  Memory: Immediate Fair; Recent Fair; Remote Poor  Judgment: Impaired  Insight: Shallow   Executive Functions  Concentration: Poor  Attention Span: Poor  Recall: [employer] of Knowledge: Fair  Language: Fair   Psychomotor Activity  Psychomotor Activity: Psychomotor Activity: Normal   Assets  Assets: Communication Skills; Financial Resources/Insurance; Social Support; Resilience    Musculoskeletal: Strength & Muscle Tone: within normal limits Gait & Station: normal  Physical Exam: Physical Exam Vitals and nursing note reviewed.  HENT:     Head: Normocephalic.     Nose: Nose normal.  Mouth/Throat:     Mouth: Mucous membranes are moist.  Cardiovascular:     Rate and Rhythm: Normal rate.     Pulses: Normal pulses.  Pulmonary:     Effort: Pulmonary effort is normal.  Abdominal:     Palpations: Abdomen is soft.  Neurological:     Mental Status: She is alert.    Review of Systems  Constitutional: Negative.   HENT: Negative.    Eyes: Negative.   Cardiovascular: Negative.   Gastrointestinal: Negative.   Skin: Negative.    Blood pressure (!) 126/46, pulse 78, temperature 98.2 F (36.8 C), resp. rate 18, height 5\' 3"  (1.6 m), weight 62.8 kg, SpO2 95%. Body mass index is 24.53 kg/m.  Principal Diagnosis: MDD (major depressive disorder) Diagnosis:  Principal Problem:   MDD (major depressive disorder) Alcohol use disorder severe MDD moderate Clinical Decision Making: Patient with history of depression, anxiety, severe alcohol use admitted to inpatient psychiatric unit on IVC initiated by her friend after patient made statements of suicide in the  context of being intoxicated.  Patient is requesting detox outpatient substance use resources  Treatment Plan Summary:  Safety and Monitoring:             -- involuntary admission to inpatient psychiatric unit for safety, stabilization and treatment             -- Daily contact with patient to assess and evaluate symptoms and progress in treatment             -- Patient's case to be discussed in multi-disciplinary team meeting             -- Observation Level: q15 minute checks             -- Vital signs:  q12 hours             -- Precautions: suicide, elopement, and assault   2. Psychiatric Diagnoses and Treatment:               Patient is declining antidepressants at this time CIWA protocol and continue Ativan  as needed for CIWA score as she also has Ativan  prescribed by her outpatient psychiatrist for anxiety and discussed the possibility of adding gabapentin  to help with chronic alcoholism.  Patient is noted to have polypharmacy of Valium , Ativan , gabapentin , Ambien .  We will not be initiating all those and watch carefully on the unit   -- The risks/benefits/side-effects/alternatives to this medication were discussed in detail with the patient and time was given for questions. The patient consents to medication trial.                -- Metabolic profile and EKG monitoring obtained while on an atypical antipsychotic (BMI: Lipid Panel: HbgA1c: QTc:)              -- Encouraged patient to participate in unit milieu and in scheduled group therapies                            3. Medical Issues Being Addressed:      4. Discharge Planning:              -- Social work and case management to assist with discharge planning and identification of hospital follow-up needs prior to discharge             -- Estimated LOS: 5-7 days             --  Discharge Concerns: Need to establish a safety plan; Medication compliance and effectiveness             -- Discharge Goals: Return home with outpatient  referrals follow ups  Physician Treatment Plan for Primary Diagnosis: MDD (major depressive disorder) Long Term Goal(s): Improvement in symptoms so as ready for discharge  Short Term Goals: Ability to identify changes in lifestyle to reduce recurrence of condition will improve, Ability to verbalize feelings will improve, Ability to disclose and discuss suicidal ideas, Ability to demonstrate self-control will improve, and Ability to identify and develop effective coping behaviors will improve  Physician Treatment Plan for Secondary Diagnosis: Principal Problem:   MDD (major depressive disorder)  Long Term Goal(s): Improvement in symptoms so as ready for discharge  Short Term Goals: Ability to identify changes in lifestyle to reduce recurrence of condition will improve, Ability to verbalize feelings will improve, Ability to disclose and discuss suicidal ideas, Ability to demonstrate self-control will improve, Ability to identify and develop effective coping behaviors will improve, Ability to maintain clinical measurements within normal limits will improve, Compliance with prescribed medications will improve, and Ability to identify triggers associated with substance abuse/mental health issues will improve  I certify that inpatient services furnished can reasonably be expected to improve the patient's condition.    Asher Torpey, MD 5/18/20255:11 PM
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
## 2024-03-07 NOTE — Progress Notes (Signed)
   03/07/24 0641  15 Minute Checks  Location Bedroom  Visual Appearance Calm  Behavior Sleeping  Sleep (Behavioral Health Patients Only)  Calculate sleep? (Click Yes once per 24 hr at 0600 safety check) Yes  Documented sleep last 24 hours 9.25

## 2024-03-08 DIAGNOSIS — F10930 Alcohol use, unspecified with withdrawal, uncomplicated: Secondary | ICD-10-CM

## 2024-03-08 DIAGNOSIS — F331 Major depressive disorder, recurrent, moderate: Secondary | ICD-10-CM

## 2024-03-08 MED ORDER — NAPROXEN 250 MG PO TABS
250.0000 mg | ORAL_TABLET | Freq: Two times a day (BID) | ORAL | Status: DC
  Administered 2024-03-08 – 2024-03-12 (×8): 250 mg via ORAL
  Filled 2024-03-08 (×11): qty 1

## 2024-03-08 MED ORDER — BISACODYL 5 MG PO TBEC
5.0000 mg | DELAYED_RELEASE_TABLET | Freq: Every day | ORAL | Status: DC | PRN
  Administered 2024-03-08: 5 mg via ORAL
  Filled 2024-03-08: qty 1

## 2024-03-08 MED ORDER — ADULT MULTIVITAMIN W/MINERALS CH
1.0000 | ORAL_TABLET | Freq: Every day | ORAL | Status: DC
Start: 2024-03-08 — End: 2024-03-08

## 2024-03-08 MED ORDER — CHLORDIAZEPOXIDE HCL 25 MG PO CAPS: 25.0000 mg | ORAL_CAPSULE | Freq: Four times a day (QID) | ORAL | Status: DC | PRN

## 2024-03-08 MED ORDER — THIAMINE HCL 100 MG/ML IJ SOLN
100.0000 mg | Freq: Once | INTRAMUSCULAR | Status: AC
  Administered 2024-03-08: 100 mg via INTRAMUSCULAR
  Filled 2024-03-08: qty 2

## 2024-03-08 MED ORDER — HYDROXYZINE HCL 25 MG PO TABS: 25.0000 mg | ORAL_TABLET | Freq: Four times a day (QID) | ORAL | Status: DC | PRN

## 2024-03-08 MED ORDER — ONDANSETRON 4 MG PO TBDP: 4.0000 mg | ORAL_TABLET | Freq: Four times a day (QID) | ORAL | Status: AC | PRN

## 2024-03-08 MED ORDER — LORAZEPAM 1 MG PO TABS
1.0000 mg | ORAL_TABLET | Freq: Four times a day (QID) | ORAL | Status: DC | PRN
  Administered 2024-03-08 – 2024-03-09 (×2): 1 mg via ORAL
  Filled 2024-03-08 (×2): qty 1

## 2024-03-08 MED ORDER — LOPERAMIDE HCL 2 MG PO CAPS: 2.0000 mg | ORAL_CAPSULE | ORAL | Status: AC | PRN

## 2024-03-08 NOTE — Progress Notes (Signed)
   03/08/24 0722  Psych Admission Type (Psych Patients Only)  Admission Status Involuntary  Psychosocial Assessment  Patient Complaints Anxiety;Agitation  Eye Contact Fair  Facial Expression Flat  Affect Anxious  Speech Logical/coherent  Interaction Needy;Attention-seeking  Motor Activity Slow  Appearance/Hygiene Unremarkable  Behavior Characteristics Cooperative  Mood Anxious  Thought Process  Coherency WDL  Content WDL  Delusions None reported or observed  Perception WDL  Hallucination None reported or observed  Judgment WDL  Confusion None  Danger to Self  Current suicidal ideation? Denies

## 2024-03-08 NOTE — Group Note (Signed)
 Date:  03/08/2024 Time:  10:14 PM  Group Topic/Focus:  Wrap-Up Group:   The focus of this group is to help patients review their daily goal of treatment and discuss progress on daily workbooks.    Participation Level:  Active  Participation Quality:  Appropriate  Affect:  Appropriate  Cognitive:  Alert  Insight: Appropriate  Engagement in Group:  Engaged  Modes of Intervention:  Discussion  Additional Comments:    Rolland Cline 03/08/2024, 10:14 PM

## 2024-03-08 NOTE — BH IP Treatment Plan (Signed)
 Interdisciplinary Treatment and Diagnostic Plan Update  03/08/2024 Time of Session: 10:40 AM  CORALEE EDBERG MRN: [id number]  Principal Diagnosis: MDD (major depressive disorder)  Secondary Diagnoses: Principal Problem:   MDD (major depressive disorder)   Current Medications:  Current Facility-Administered Medications  Medication Dose Route Frequency Provider Last Rate Last Admin   acetaminophen  (TYLENOL ) tablet 650 mg  650 mg Oral Q6H PRN Motley-Mangrum, Jadeka A, PMHNP       alum & mag hydroxide-simeth (MAALOX/MYLANTA) 200-200-20 MG/5ML suspension 30 mL  30 mL Oral Q4H PRN Motley-Mangrum, Jadeka A, PMHNP       fluticasone  furoate-vilanterol (BREO ELLIPTA ) 200-25 MCG/ACT 1 puff  1 puff Inhalation Daily Jadapalle, Sree, MD   1 puff at 03/08/24 0919   folic acid  (FOLVITE ) tablet 1 mg  1 mg Oral Daily Motley-Mangrum, Jadeka A, PMHNP   1 mg at 03/08/24 0850   hydrOXYzine  (ATARAX ) tablet 25 mg  25 mg Oral TID PRN Motley-Mangrum, Jadeka A, PMHNP   25 mg at 03/08/24 0020   levalbuterol  (XOPENEX ) nebulizer solution 0.63 mg  0.63 mg Inhalation Q8H PRN Aurelia Blotter, MD       LORazepam  (ATIVAN ) tablet 1-4 mg  1-4 mg Oral Q1H PRN Motley-Mangrum, Jadeka A, PMHNP   2 mg at 03/08/24 0850   Or   LORazepam  (ATIVAN ) injection 1-4 mg  1-4 mg Intravenous Q1H PRN Motley-Mangrum, Jadeka A, PMHNP       magnesium  hydroxide (MILK OF MAGNESIA) suspension 30 mL  30 mL Oral Daily PRN Motley-Mangrum, Jadeka A, PMHNP       multivitamin with minerals tablet 1 tablet  1 tablet Oral Daily Motley-Mangrum, Jadeka A, PMHNP   1 tablet at 03/08/24 0850   nicotine  polacrilex (NICORETTE ) gum 2 mg  2 mg Oral PRN Jadapalle, Sree, MD   2 mg at 03/07/24 1833   OLANZapine  (ZYPREXA ) injection 5 mg  5 mg Intramuscular TID PRN Motley-Mangrum, Jadeka A, PMHNP       OLANZapine  zydis (ZYPREXA ) disintegrating tablet 5 mg  5 mg Oral TID PRN Motley-Mangrum, Jadeka A, PMHNP       rosuvastatin  (CRESTOR ) tablet 10 mg  10 mg Oral Daily  Jadapalle, Sree, MD       sodium chloride  tablet 1 g  1 g Oral BID WC Motley-Mangrum, Jadeka A, PMHNP   1 g at 03/08/24 0850   thiamine  (VITAMIN B1) tablet 100 mg  100 mg Oral Daily Motley-Mangrum, Jadeka A, PMHNP   100 mg at 03/08/24 0850   Or   thiamine  (VITAMIN B1) injection 100 mg  100 mg Intravenous Daily Motley-Mangrum, Jadeka A, PMHNP       Facility-Administered Medications Ordered in Other Encounters  Medication Dose Route Frequency Provider Last Rate Last Admin   chlorhexidine  (HIBICLENS ) 4 % liquid 1 application  1 application  Topical Once Toth, Paul III, MD       chlorhexidine  (HIBICLENS ) 4 % liquid 1 application  1 application  Topical Once Toth, Paul III, MD       PTA Medications: Medications Prior to Admission  Medication Sig Dispense Refill Last Dose/Taking   Ascorbic Acid  (VITAMIN C ) 1000 MG tablet Take 1,000 mg by mouth daily. (Patient not taking: Reported on 03/06/2024)      aspirin  EC 81 MG tablet Take 1 tablet (81 mg total) by mouth daily. (Patient not taking: Reported on 03/06/2024) 90 tablet 3    budesonide -formoterol  (SYMBICORT ) 160-4.5 MCG/ACT inhaler Inhale 2 puffs into the lungs 2 (two) times daily. 1 each 5  cetirizine  (ZYRTEC ) 10 MG tablet Take 1 tablet (10 mg total) by mouth daily. (Patient taking differently: Take 10 mg by mouth daily as needed for allergies.) 30 tablet 5    cilostazol  (PLETAL ) 50 MG tablet Take 1 tablet (50 mg total) by mouth 2 (two) times daily. (Patient not taking: Reported on 03/06/2024) 180 tablet 3    cyclobenzaprine  (FLEXERIL ) 10 MG tablet Take 10 mg by mouth 3 (three) times daily as needed for muscle spasms.      DULoxetine  (CYMBALTA ) 30 MG capsule Take 30 mg by mouth daily. (Patient not taking: Reported on 03/06/2024)      EPINEPHrine  (AUVI-Q ) 0.1 MG/0.1ML SOAJ Inject into the muscle.      gabapentin  (NEURONTIN ) 300 MG capsule Take 300 mg by mouth daily as needed (nerve pain).      levalbuterol  (XOPENEX  HFA) 45 MCG/ACT inhaler INHALE 2  PUFFS INTO THE LUNGS EVERY 4 HOURS AS NEEDED FOR WHEEZING 15 g 1    levalbuterol  (XOPENEX ) 1.25 MG/3ML nebulizer solution Take 1.25 mg by nebulization every 4 (four) hours as needed for wheezing. 72 mL 5    LORazepam  (ATIVAN ) 2 MG tablet Take 2 mg by mouth in the morning, at noon, and at bedtime.      methocarbamol (ROBAXIN) 750 MG tablet Take 750 mg by mouth 3 (three) times daily.      Na Sulfate-K Sulfate-Mg Sulf 17.5-3.13-1.6 GM/177ML SOLN Take 1 kit by mouth once.      nitroGLYCERIN  (NITROSTAT ) 0.4 MG SL tablet Place 1 tablet (0.4 mg total) under the tongue every 5 (five) minutes as needed for chest pain. 25 tablet 11    olmesartan  (BENICAR ) 40 MG tablet Take 1 tablet (40 mg total) by mouth daily. 30 tablet 6    promethazine  (PHENERGAN ) 25 MG tablet Take 25 mg by mouth every 4 (four) hours as needed.      rosuvastatin  (CRESTOR ) 10 MG tablet Take 1 tablet (10 mg total) by mouth daily. (Patient not taking: Reported on 03/06/2024)      valACYclovir  (VALTREX ) 500 MG tablet Take 500 mg by mouth daily as needed (outbreaks).      Vitamin D , Cholecalciferol , 25 MCG (1000 UT) TABS Take 25 mcg by mouth daily. (Patient not taking: Reported on 03/06/2024) 60 tablet 3    zolpidem  (AMBIEN ) 10 MG tablet Take 10 mg by mouth at bedtime as needed for sleep.       Patient Stressors: Substance abuse    Patient Strengths: Manufacturing systems engineer  Supportive family/friends   Treatment Modalities: Medication Management, Group therapy, Case management,  1 to 1 session with clinician, Psychoeducation, Recreational therapy.   Physician Treatment Plan for Primary Diagnosis: MDD (major depressive disorder) Long Term Goal(s): Improvement in symptoms so as ready for discharge   Short Term Goals: Ability to identify changes in lifestyle to reduce recurrence of condition will improve Ability to verbalize feelings will improve Ability to disclose and discuss suicidal ideas Ability to demonstrate self-control will  improve Ability to identify and develop effective coping behaviors will improve Ability to maintain clinical measurements within normal limits will improve Compliance with prescribed medications will improve Ability to identify triggers associated with substance abuse/mental health issues will improve  Medication Management: Evaluate patient's response, side effects, and tolerance of medication regimen.  Therapeutic Interventions: 1 to 1 sessions, Unit Group sessions and Medication administration.  Evaluation of Outcomes: Progressing  Physician Treatment Plan for Secondary Diagnosis: Principal Problem:   MDD (major depressive disorder)  Long Term Goal(s): Improvement  in symptoms so as ready for discharge   Short Term Goals: Ability to identify changes in lifestyle to reduce recurrence of condition will improve Ability to verbalize feelings will improve Ability to disclose and discuss suicidal ideas Ability to demonstrate self-control will improve Ability to identify and develop effective coping behaviors will improve Ability to maintain clinical measurements within normal limits will improve Compliance with prescribed medications will improve Ability to identify triggers associated with substance abuse/mental health issues will improve     Medication Management: Evaluate patient's response, side effects, and tolerance of medication regimen.  Therapeutic Interventions: 1 to 1 sessions, Unit Group sessions and Medication administration.  Evaluation of Outcomes: Progressing   RN Treatment Plan for Primary Diagnosis: MDD (major depressive disorder) Long Term Goal(s): Knowledge of disease and therapeutic regimen to maintain health will improve  Short Term Goals: Ability to remain free from injury will improve, Ability to verbalize frustration and anger appropriately will improve, Ability to demonstrate self-control, Ability to participate in decision making will improve, Ability to  verbalize feelings will improve, Ability to disclose and discuss suicidal ideas, Ability to identify and develop effective coping behaviors will improve, and Compliance with prescribed medications will improve  Medication Management: RN will administer medications as ordered by provider, will assess and evaluate patient's response and provide education to patient for prescribed medication. RN will report any adverse and/or side effects to prescribing provider.  Therapeutic Interventions: 1 on 1 counseling sessions, Psychoeducation, Medication administration, Evaluate responses to treatment, Monitor vital signs and CBGs as ordered, Perform/monitor CIWA, COWS, AIMS and Fall Risk screenings as ordered, Perform wound care treatments as ordered.  Evaluation of Outcomes: Progressing   LCSW Treatment Plan for Primary Diagnosis: MDD (major depressive disorder) Long Term Goal(s): Safe transition to appropriate next level of care at discharge, Engage patient in therapeutic group addressing interpersonal concerns.  Short Term Goals: Engage patient in aftercare planning with referrals and resources, Increase social support, Increase ability to appropriately verbalize feelings, Increase emotional regulation, Facilitate acceptance of mental health diagnosis and concerns, Facilitate patient progression through stages of change regarding substance use diagnoses and concerns, Identify triggers associated with mental health/substance abuse issues, and Increase skills for wellness and recovery  Therapeutic Interventions: Assess for all discharge needs, 1 to 1 time with Social worker, Explore available resources and support systems, Assess for adequacy in community support network, Educate family and significant other(s) on suicide prevention, Complete Psychosocial Assessment, Interpersonal group therapy.  Evaluation of Outcomes: Progressing   Progress in Treatment: Attending groups: Yes. and No. Participating in  groups: Yes. and No. Taking medication as prescribed: Yes. Toleration medication: Yes. Family/Significant other contact made: No, will contact:  CSW will contact if given permission  Patient understands diagnosis: Yes. Discussing patient identified problems/goals with staff: Yes. Medical problems stabilized or resolved: Yes. Denies suicidal/homicidal ideation: Yes. Issues/concerns per patient self-inventory: No. Other: None   New problem(s) identified: No, Describe:  None identified   New Short Term/Long Term Goal(s): detox, elimination of symptoms of psychosis, medication management for mood stabilization; elimination of SI thoughts; development of comprehensive mental wellness/sobriety plan.   Patient Goals:  "To go home and do the work I need to do for my recovery"  Discharge Plan or Barriers:  CSW will assist with appropriate discharge planning   Reason for Continuation of Hospitalization: Depression Medication stabilization Withdrawal symptoms  Estimated Length of Stay: 1 to 7 days   Last 3 [place] Suicide Severity Risk Score: Flowsheet Row Admission (Current) from 03/06/2024  in [facility] ED from 03/05/2024 in [facility] Emergency Department at [facility] ED from 02/26/2024 in [facility] Emergency Department at [facility]  C-SSRS RISK CATEGORY No Risk No Risk No Risk       Last [facility] 2/9 Scores:     No data to display          Scribe for Treatment Team: Claudio Culver, Milinda Allen 03/08/2024 1:38 PM

## 2024-03-08 NOTE — Progress Notes (Signed)
 [facility] MD Progress Note  03/08/2024 2:52 PM Sarah Reeves  MRN:  [id number] Sarah Reeves is a 63 year old female who presents to [facility] escorted by [employer] under IVC. Patient has a reported history of MDD,GAD, and Alcohol abuse.   Per IVC "Respondent stated multiple times that they would kill themselves. Respondent stated she was going to run into traffic. Respondent is currently experiencing a medical emergency confirmed by EMS. Respondent is refusing to seek treatment. Respondent is a danger to self. Respondent is a danger to others."  Subjective:  Chart reviewed, case discussed in multidisciplinary meeting, patient seen during rounds.  Patient met with the treatment team today and remains very circumstantial or tangential at times if not tried to be redirected.  Patient talks about feeling lonely and her trauma of losing her husband, trying to make friends with another guy, having problem with alcohol.  She when provider asked if she wants to go to his IOP she declines stating that she is planning to go back home and to participate in AA programs.  She informed the team that she has tried S IOP and intensive programs and she does not want to sit in 1 place and feels bored.  She tries to minimize her alcohol use most of the time.  She wants her Ativan  prescribed back as she was taking 2 mg reportedly 3 times per day.  Provider discussed about Ativan  being a benzodiazepine sleeping and having serious medical complications when taken along with alcohol.  Provider and patient agreed to get Ativan  as part of CIWA protocol removed and added her home Ativan  but just at 1 mg every 6 hours as needed anxiety.  She denies SI/HI/plan and denies hallucinations.   Sleep: Fair  Appetite:  Fair  Past Psychiatric History: see h&P Family History:  Family History  Problem Relation Age of Onset   Diabetes Mother    Lung disease Mother    Heart disease Mother    Breast cancer Mother    Heart disease Father    Colon  cancer Father    Hypothyroidism Brother    Alzheimer's disease Maternal Grandmother    Alzheimer's disease Maternal Aunt    Skin cancer Maternal Aunt    Stomach cancer Neg Hx    Esophageal cancer Neg Hx    Social History:  Social History   Substance and Sexual Activity  Alcohol Use Yes   Alcohol/week: 49.0 standard drinks of alcohol   Types: 49 Shots of liquor per week   Comment: Drinking 5-6 drinks per day (grey goose).  02/10/2024 hfb     Social History   Substance and Sexual Activity  Drug Use No    Social History   Socioeconomic History   Marital status: Married    Spouse name: Not on file   Number of children: 0   Years of education: 12   Highest education level: Not on file  Occupational History   Occupation: Retired   Occupation: retired  Tobacco Use   Smoking status: Every Day    Current packs/day: 1.00    Average packs/day: 1 pack/day for 47.0 years (47.0 ttl pk-yrs)    Types: Cigarettes    Passive exposure: Current   Smokeless tobacco: Never   Tobacco comments:    Had a bad break up with boyfriend, she has been smoking and drinking.  10 cigarettes/day.  02/10/2024 hfb  Vaping Use   Vaping status: Never Used  Substance and Sexual Activity   Alcohol use:  Yes    Alcohol/week: 49.0 standard drinks of alcohol    Types: 49 Shots of liquor per week    Comment: Drinking 5-6 drinks per day (grey goose).  02/10/2024 hfb   Drug use: No   Sexual activity: Yes    Birth control/protection: Surgical  Other Topics Concern   Not on file  Social History Narrative   Patient is right handed.   Four cups caffeine per day.   Lives at home with husband.   Social Drivers of Corporate investment banker Strain: Not on file  Food Insecurity: No Food Insecurity (03/06/2024)   Hunger Vital Sign    Worried About Running Out of Food in the Last Year: Never true    Ran Out of Food in the Last Year: Never true  Transportation Needs: No Transportation Needs (03/06/2024)    PRAPARE - Administrator, Civil Service (Medical): No    Lack of Transportation (Non-Medical): No  Physical Activity: Not on file  Stress: Not on file  Social Connections: Unknown (02/25/2023)   Received from [facility], [facility]   Social Network    Social Network: Not on file   Past Medical History:  Past Medical History:  Diagnosis Date   Allergy     Angio-edema    Anxiety    Arthritis    Asthma    Clostridioides difficile infection    Complication of anesthesia    takes alot to sedated   COPD (chronic obstructive pulmonary disease) (HCC)    Depression    Emphysema of lung (HCC)    Fibromyalgia    GERD (gastroesophageal reflux disease)    Hepatitis C    (Dr. Andriette Keeling) Treated with Harvoni March-May 2016   Herpes simplex    Hyperlipemia    Hypertension    Insomnia    Mental disorder    Neuropathy    Osteoporosis    Oxygen  deficiency    PONV (postoperative nausea and vomiting)    Sleep apnea    Substance abuse (HCC)    Urticaria     Past Surgical History:  Procedure Laterality Date   AUGMENTATION MAMMAPLASTY Bilateral    BREAST BIOPSY Left 11/2014   BREAST BIOPSY Left 12/2014   BREAST ENHANCEMENT SURGERY     BREAST LUMPECTOMY WITH RADIOACTIVE SEED LOCALIZATION Left 12/26/2014   Procedure: LEFT BREAST LUMPECTOMY WITH RADIOACTIVE SEED LOCALIZATION;  Surgeon: Lillette Reid III, MD;  Location: [facility];  Service: General;  Laterality: Left;   BREAST REDUCTION SURGERY     COLONOSCOPY     CT CTA CORONARY W/CA SCORE W/CM &/OR WO/CM  01/19/2023   CAC score 0.  Noncalcified ostial large D1 plaque (50 to 69%-CT FFR negative/nonobstructive).  Otherwise minimal noncalcified plaque in the proximal LAD and proximal RCA.   EXPLORATORY LAPAROTOMY     NM MYOVIEW  LTD  07/07/2015   Normal Myocardial Perfusion Scan. Low risk lexiscan  nuclear study with minimal insignificant breast attenuation and normal myocardial perfusion and function; EF 53% without  wall motion abnormalities and normal systolic thickening   TEMPOROMANDIBULAR JOINT SURGERY     TRANSTHORACIC ECHOCARDIOGRAM  03/11/2023   Normal LV size and function.  EF 60 to 65% with no RWMA.  GR 1 DD.  Normal RV size and function.  Normal RVP and RAP.  Moderate MAC but no MS or MR.   Normal AoV.   TUBAL LIGATION     UPPER GI ENDOSCOPY     x5  Current Medications: Current Facility-Administered Medications  Medication Dose Route Frequency Provider Last Rate Last Admin   acetaminophen  (TYLENOL ) tablet 650 mg  650 mg Oral Q6H PRN Motley-Mangrum, Jadeka A, PMHNP       alum & mag hydroxide-simeth (MAALOX/MYLANTA) 200-200-20 MG/5ML suspension 30 mL  30 mL Oral Q4H PRN Motley-Mangrum, Jadeka A, PMHNP       bisacodyl  (DULCOLAX) EC tablet 5 mg  5 mg Oral Daily PRN Shomari Scicchitano, MD   5 mg at 03/08/24 1406   fluticasone  furoate-vilanterol (BREO ELLIPTA ) 200-25 MCG/ACT 1 puff  1 puff Inhalation Daily Janace Decker, MD   1 puff at 03/08/24 0919   folic acid  (FOLVITE ) tablet 1 mg  1 mg Oral Daily Motley-Mangrum, Jadeka A, PMHNP   1 mg at 03/08/24 0850   hydrOXYzine  (ATARAX ) tablet 25 mg  25 mg Oral TID PRN Motley-Mangrum, Jadeka A, PMHNP   25 mg at 03/08/24 0020   levalbuterol  (XOPENEX ) nebulizer solution 0.63 mg  0.63 mg Inhalation Q8H PRN Bayley Yarborough, MD       LORazepam  (ATIVAN ) tablet 1-4 mg  1-4 mg Oral Q1H PRN Motley-Mangrum, Jadeka A, PMHNP   2 mg at 03/08/24 0850   Or   LORazepam  (ATIVAN ) injection 1-4 mg  1-4 mg Intravenous Q1H PRN Motley-Mangrum, Jadeka A, PMHNP       magnesium  hydroxide (MILK OF MAGNESIA) suspension 30 mL  30 mL Oral Daily PRN Motley-Mangrum, Jadeka A, PMHNP       multivitamin with minerals tablet 1 tablet  1 tablet Oral Daily Motley-Mangrum, Jadeka A, PMHNP   1 tablet at 03/08/24 0850   naproxen  (NAPROSYN ) tablet 250 mg  250 mg Oral BID WC Kimberley Speece, MD   250 mg at 03/08/24 1405   nicotine  polacrilex (NICORETTE ) gum 2 mg  2 mg Oral PRN Truman Aceituno, MD    2 mg at 03/07/24 1833   OLANZapine  (ZYPREXA ) injection 5 mg  5 mg Intramuscular TID PRN Motley-Mangrum, Jadeka A, PMHNP       OLANZapine  zydis (ZYPREXA ) disintegrating tablet 5 mg  5 mg Oral TID PRN Motley-Mangrum, Jadeka A, PMHNP       rosuvastatin  (CRESTOR ) tablet 10 mg  10 mg Oral Daily Muriah Harsha, MD       sodium chloride  tablet 1 g  1 g Oral BID WC Motley-Mangrum, Jadeka A, PMHNP   1 g at 03/08/24 0850   thiamine  (VITAMIN B1) tablet 100 mg  100 mg Oral Daily Motley-Mangrum, Jadeka A, PMHNP   100 mg at 03/08/24 1610   Or   thiamine  (VITAMIN B1) injection 100 mg  100 mg Intravenous Daily Motley-Mangrum, Jadeka A, PMHNP       Facility-Administered Medications Ordered in Other Encounters  Medication Dose Route Frequency Provider Last Rate Last Admin   chlorhexidine  (HIBICLENS ) 4 % liquid 1 application  1 application  Topical Once Toth, Paul III, MD       chlorhexidine  (HIBICLENS ) 4 % liquid 1 application  1 application  Topical Once Toth, Paul III, MD        Lab Results: No results found for this or any previous visit (from the past 48 hours).  Blood Alcohol level:  Lab Results  Component Value Date   ETH 282 (H) 03/05/2024   ETH <10 02/04/2024    Metabolic Disorder Labs: Lab Results  Component Value Date   HGBA1C 6.0 (H) 03/10/2021   MPG 125.5 03/10/2021   MPG 114.02 06/27/2018   Lab Results  Component Value Date  PROLACTIN 24.0 (H) 06/27/2018   Lab Results  Component Value Date   CHOL 272 (H) 01/13/2024   TRIG 84 01/13/2024   HDL 132 01/13/2024   CHOLHDL 2.1 01/13/2024   VLDL 23 06/27/2018   LDLCALC 127 (H) 01/13/2024   LDLCALC 121 (H) 01/24/2023    Physical Findings: AIMS:  , ,  ,  ,    CIWA:  CIWA-Ar Total: 5 COWS:      Psychiatric Specialty Exam:  Presentation  General Appearance:  Appropriate for Environment; Casual  Eye Contact: Fair  Speech: Clear and Coherent  Speech Volume: Normal    Mood and Affect   Mood: Anxious  Affect: Appropriate   Thought Process  Thought Processes: Coherent  Descriptions of Associations:Intact  Orientation:Full (Time, Place and Person)  Thought Content:Illogical  Hallucinations:Hallucinations: None  Ideas of Reference:None  Suicidal Thoughts:Suicidal Thoughts: No  Homicidal Thoughts:Homicidal Thoughts: No   Sensorium  Memory: Immediate Fair; Recent Fair; Remote Poor  Judgment: Impaired  Insight: Shallow   Executive Functions  Concentration: Poor  Attention Span: Poor  Recall: [employer] of Knowledge: Fair  Language: Fair   Psychomotor Activity  Psychomotor Activity: Psychomotor Activity: Normal  Musculoskeletal: Strength & Muscle Tone: within normal limits Gait & Station: normal Assets  Assets: Manufacturing systems engineer; Financial Resources/Insurance; Social Support; Resilience    Physical Exam: Physical Exam Vitals and nursing note reviewed.    ROS Blood pressure 126/73, pulse 69, temperature 97.7 F (36.5 C), resp. rate 15, height 5\' 3"  (1.6 m), weight 62.8 kg, SpO2 97%. Body mass index is 24.53 kg/m.  Diagnosis: Principal Problem:   MDD (major depressive disorder) Mode arte   Clinical Decision Making: Patient with history of depression, anxiety, severe alcohol use admitted to inpatient psychiatric unit on IVC initiated by her friend after patient made statements of suicide in the context of being intoxicated.  Patient is requesting detox outpatient substance use resources   Treatment Plan Summary:   Safety and Monitoring:             -- involuntary admission to inpatient psychiatric unit for safety, stabilization and treatment             -- Daily contact with patient to assess and evaluate symptoms and progress in treatment             -- Patient's case to be discussed in multi-disciplinary team meeting             -- Observation Level: q15 minute checks             -- Vital signs:  q12 hours              -- Precautions: suicide, elopement, and assault   2. Psychiatric Diagnoses and Treatment:               Patient is declining antidepressants at this time CIWA protocol and added librium  PRN for detox and added patient home med ativan  1mg  Q6 hr prn for anxiety to prevent withdrawals from benzos. Patient is noted to have polypharmacy of Valium , Ativan , gabapentin , Ambien .  We will not be initiating all those and watch carefully on the unit   -- The risks/benefits/side-effects/alternatives to this medication were discussed in detail with the patient and time was given for questions. The patient consents to medication trial.                -- Metabolic profile and EKG monitoring obtained while on an atypical antipsychotic (BMI: Lipid  Panel: HbgA1c: QTc:)              -- Encouraged patient to participate in unit milieu and in scheduled group therapies                            3. Medical Issues Being Addressed:      4. Discharge Planning:              -- Social work and case management to assist with discharge planning and identification of hospital follow-up needs prior to discharge             -- Estimated LOS: 5-7 days             -- Discharge Concerns: Need to establish a safety plan; Medication compliance and effectiveness             -- Discharge Goals: Return home with outpatient referrals follow ups  Aurelia Blotter, MD 03/08/2024, 2:52 PM

## 2024-03-08 NOTE — Group Note (Deleted)
 Date:  03/08/2024 Time:  12:08 PM  Group Topic/Focus:  Personal Choices and Values:   The focus of this group is to help patients assess and explore the importance of values in their lives, how their values affect their decisions, how they express their values and what opposes their expression.     Participation Level:  {BHH PARTICIPATION HQION:62952}  Participation Quality:  {BHH PARTICIPATION QUALITY:22265}  Affect:  {BHH AFFECT:22266}  Cognitive:  {BHH COGNITIVE:22267}  Insight: {BHH Insight2:20797}  Engagement in Group:  {BHH ENGAGEMENT IN WUXLK:44010}  Modes of Intervention:  {BHH MODES OF INTERVENTION:22269}  Additional Comments:  ***  Sarah Reeves [name] 03/08/2024, 12:08 PM

## 2024-03-08 NOTE — Group Note (Signed)
 Recreation Therapy Group Note   Group Topic:Emotion Expression  Group Date: 03/08/2024 Start Time: 1400 End Time: 1500 Facilitators: Deatrice Factor, LRT, CTRS Location: Courtyard  Group Description: Music. Patients encouraged to name their favorite song(s) for LRT to play song through speaker for group to hear. Patient educated on the definition of leisure and the importance of having different leisure interests outside of the hospital. Group discussed how leisure activities can often be used as Pharmacologist and that listening to music is one example. Patients also assisted in watering the raised garden beds in the courtyard.   Goal Area(s) Addressed:  Patient will identify a current leisure interest.  Patient will practice making a positive decision. Patient will have the opportunity to try a new leisure activity.   Affect/Mood: Appropriate   Participation Level: Active and Engaged   Participation Quality: Independent   Behavior: Appropriate, Calm, and Cooperative   Speech/Thought Process: Coherent   Insight: Good   Judgement: Good   Modes of Intervention: Music   Patient Response to Interventions:  Attentive, Engaged, Interested , and Receptive   Education Outcome:  Acknowledges education   Clinical Observations/Individualized Feedback: Malaiya was active in their participation of session activities and group discussion. Pt interacted well with LRT and peers duration of session.    Plan: Continue to engage patient in RT group sessions 2-3x/week.   Deatrice Factor, LRT, CTRS 03/08/2024 4:50 PM

## 2024-03-08 NOTE — Group Note (Signed)
 Date:  03/08/2024 Time:  12:14 PM  Group Topic/Focus:  The purpose of this group is to touch into patients past and discuss what it was like and some of their likes and dislikes from their past.    Participation Level:  Active  Participation Quality:  Appropriate  Affect:  Appropriate  Cognitive:  Appropriate  Insight: Appropriate  Engagement in Group:  Engaged  Modes of Intervention:  Activity and Discussion  Additional Comments:    Linnell Richardson 03/08/2024, 12:14 PM

## 2024-03-08 NOTE — Group Note (Signed)
 Date:  03/08/2024 Time:  12:07 AM  Group Topic/Focus:  Wrap-Up Group:   The focus of this group is to help patients review their daily goal of treatment and discuss progress on daily workbooks.    Participation Level:  Active  Participation Quality:  Appropriate  Affect:  Appropriate  Cognitive:  Appropriate  Insight: Appropriate  Engagement in Group:  Engaged  Modes of Intervention:  Discussion  Additional Comments:    Rolland Cline 03/08/2024, 12:07 AM

## 2024-03-08 NOTE — Progress Notes (Addendum)
 Pt visible in milieu with minimal interaction.  Mood depressed and anxious.  Pt denied SI/HI, AVH.  Pt reported difficulty sleeping, restless, and agitated, expressed fear or withdrawal.  Ativan  and Atarax  given.  CIWA score 5. Pt OOB c/o dry eyes.      03/08/24 0100  Psych Admission Type (Psych Patients Only)  Admission Status Involuntary  Psychosocial Assessment  Patient Complaints Agitation;Anxiety  Eye Contact Fair  Facial Expression Flat  Affect Anxious  Speech Logical/coherent  Interaction Minimal  Motor Activity Slow  Appearance/Hygiene Unremarkable  Behavior Characteristics Cooperative;Anxious;Agitated  Mood Anxious  Thought Process  Coherency WDL  Content WDL  Delusions None reported or observed  Perception WDL  Hallucination None reported or observed  Judgment WDL  Confusion None  Danger to Self  Current suicidal ideation? Denies  Danger to Others  Danger to Others None reported or observed    Problem: Education: Goal: Knowledge of [place] [facility] Education information/materials will improve Outcome: Progressing Goal: Emotional status will improve Outcome: Progressing Goal: Mental status will improve Outcome: Progressing Goal: Verbalization of understanding the information provided will improve Outcome: Progressing

## 2024-03-09 ENCOUNTER — Ambulatory Visit: Admitting: General Practice

## 2024-03-09 DIAGNOSIS — F10930 Alcohol use, unspecified with withdrawal, uncomplicated: Secondary | ICD-10-CM | POA: Diagnosis not present

## 2024-03-09 DIAGNOSIS — F331 Major depressive disorder, recurrent, moderate: Secondary | ICD-10-CM | POA: Diagnosis not present

## 2024-03-09 NOTE — Group Note (Signed)
 Recreation Therapy Group Note   Group Topic:Health and Wellness  Group Date: 03/09/2024 Start Time: 1400 End Time: 1445 Facilitators: Deatrice Factor, LRT, CTRS Location: Dayroom  Activity Description/Intervention: Therapeutic Drumming. Patients with peers and staff were given the opportunity to engage in a leader facilitated [facility] Group Empowerment Drumming Circle with staff from the [employer], in partnership with [employer]. Teaching laboratory technician and trained [employer], Kathlyne Parchment leading with LRT observing and documenting intervention and pt response. This evidenced-based practice targets 7 areas of health and wellbeing in the human experience including: stress-reduction, exercise, self-expression, camaraderie/support, nurturing, spirituality, and music-making (leisure).    Goal Area(s) Addresses:  Patient will engage in pro-social way in music group.  Patient will follow directions of drum leader on the first prompt. Patient will demonstrate no behavioral issues during group.  Patient will identify if a reduction in stress level occurs as a result of participation in therapeutic drum circle.     Education: Leisure exposure, Pharmacologist, Musical expression, Discharge Planning   Affect/Mood: Appropriate   Participation Level: Active and Engaged   Participation Quality: Independent   Behavior: Appropriate, Calm, and Cooperative   Speech/Thought Process: Coherent   Insight: Good   Judgement: Good   Modes of Intervention: Music   Patient Response to Interventions:  Attentive, Engaged, Interested , and Receptive   Education Outcome:  Acknowledges education   Clinical Observations/Individualized Feedback: Sarah Reeves was active in their participation of session activities and group discussion. Pt interacted well with LRT and peers duration of session.    Plan: Continue to engage patient in RT group sessions 2-3x/week.   Deatrice Factor, LRT,  CTRS 03/09/2024 4:43 PM

## 2024-03-09 NOTE — Group Note (Signed)
 Date:  03/09/2024 Time:  10:06 PM  Group Topic/Focus:  Wrap-Up Group:   The focus of this group is to help patients review their daily goal of treatment and discuss progress on daily workbooks.    Participation Level:  Active  Participation Quality:  Appropriate  Affect:  Appropriate  Cognitive:  Appropriate  Insight: Appropriate  Engagement in Group:  Engaged  Modes of Intervention:  Discussion  Additional Comments:    Rolland Cline 03/09/2024, 10:06 PM

## 2024-03-09 NOTE — Progress Notes (Signed)
   03/09/24 1049  Psych Admission Type (Psych Patients Only)  Admission Status Involuntary  Psychosocial Assessment  Patient Complaints Anxiety  Eye Contact Fair  Facial Expression Animated  Affect Anxious  Speech Logical/coherent  Interaction Assertive;Needy  Motor Activity Slow  Appearance/Hygiene In scrubs  Behavior Characteristics Anxious  Mood Anxious  Thought Process  Coherency WDL  Content WDL  Delusions None reported or observed  Perception WDL  Hallucination None reported or observed  Judgment WDL  Confusion WDL  Danger to Self  Current suicidal ideation? Denies  Danger to Others  Danger to Others None reported or observed

## 2024-03-09 NOTE — Progress Notes (Signed)
 [facility] MD Progress Note  03/09/2024 3:32 PM Sarah Reeves  MRN:  [id number] Sarah Reeves is a 63 year old female who presents to [facility] escorted by [employer] under IVC. Patient has a reported history of MDD,GAD, and Alcohol abuse.   Per IVC "Respondent stated multiple times that they would kill themselves. Respondent stated she was going to run into traffic. Respondent is currently experiencing a medical emergency confirmed by EMS. Respondent is refusing to seek treatment. Respondent is a danger to self. Respondent is a danger to others."  Subjective:  Chart reviewed, case discussed in multidisciplinary meeting, patient seen during rounds.  .  Talked to the provider in her room and is noted to be tearful.  She reports that she talked to her brother and is waiting for my phone call.  She remains circumstantial but is redirectable.  She talks about wanting to go home and pay her bills.  She informed the provider that her brother told her that he looked for a rehab in [place]  so that she can stay close to him.  Patient reports that she has tried everything and feels like just AAA programs going out with her friends are going to help her.  She denies SI/HI/plan, denies hallucinations.   Sleep: Fair  Appetite:  Fair  Past Psychiatric History: see h&P Family History:  Family History  Problem Relation Age of Onset   Diabetes Mother    Lung disease Mother    Heart disease Mother    Breast cancer Mother    Heart disease Father    Colon cancer Father    Hypothyroidism Brother    Alzheimer's disease Maternal Grandmother    Alzheimer's disease Maternal Aunt    Skin cancer Maternal Aunt    Stomach cancer Neg Hx    Esophageal cancer Neg Hx    Social History:  Social History   Substance and Sexual Activity  Alcohol Use Yes   Alcohol/week: 49.0 standard drinks of alcohol   Types: 49 Shots of liquor per week   Comment: Drinking 5-6 drinks per day (grey goose).  02/10/2024 hfb     Social  History   Substance and Sexual Activity  Drug Use No    Social History   Socioeconomic History   Marital status: Married    Spouse name: Not on file   Number of children: 0   Years of education: 12   Highest education level: Not on file  Occupational History   Occupation: Retired   Occupation: retired  Tobacco Use   Smoking status: Every Day    Current packs/day: 1.00    Average packs/day: 1 pack/day for 47.0 years (47.0 ttl pk-yrs)    Types: Cigarettes    Passive exposure: Current   Smokeless tobacco: Never   Tobacco comments:    Had a bad break up with boyfriend, she has been smoking and drinking.  10 cigarettes/day.  02/10/2024 hfb  Vaping Use   Vaping status: Never Used  Substance and Sexual Activity   Alcohol use: Yes    Alcohol/week: 49.0 standard drinks of alcohol    Types: 49 Shots of liquor per week    Comment: Drinking 5-6 drinks per day (grey goose).  02/10/2024 hfb   Drug use: No   Sexual activity: Yes    Birth control/protection: Surgical  Other Topics Concern   Not on file  Social History Narrative   Patient is right handed.   Four cups caffeine per day.   Lives at home  with husband.   Social Drivers of Corporate investment banker Strain: Not on file  Food Insecurity: No Food Insecurity (03/06/2024)   Hunger Vital Sign    Worried About Running Out of Food in the Last Year: Never true    Ran Out of Food in the Last Year: Never true  Transportation Needs: No Transportation Needs (03/06/2024)   PRAPARE - Administrator, Civil Service (Medical): No    Lack of Transportation (Non-Medical): No  Physical Activity: Not on file  Stress: Not on file  Social Connections: Unknown (02/25/2023)   Received from [facility]   Social Network    Social Network: Not on file   Past Medical History:  Past Medical History:  Diagnosis Date   Allergy     Angio-edema    Anxiety    Arthritis    Asthma    Clostridioides difficile infection     Complication of anesthesia    takes alot to sedated   COPD (chronic obstructive pulmonary disease) (HCC)    Depression    Emphysema of lung (HCC)    Fibromyalgia    GERD (gastroesophageal reflux disease)    Hepatitis C    (Dr. Andriette Keeling) Treated with Harvoni March-May 2016   Herpes simplex    Hyperlipemia    Hypertension    Insomnia    Mental disorder    Neuropathy    Osteoporosis    Oxygen  deficiency    PONV (postoperative nausea and vomiting)    Sleep apnea    Substance abuse (HCC)    Urticaria     Past Surgical History:  Procedure Laterality Date   AUGMENTATION MAMMAPLASTY Bilateral    BREAST BIOPSY Left 11/2014   BREAST BIOPSY Left 12/2014   BREAST ENHANCEMENT SURGERY     BREAST LUMPECTOMY WITH RADIOACTIVE SEED LOCALIZATION Left 12/26/2014   Procedure: LEFT BREAST LUMPECTOMY WITH RADIOACTIVE SEED LOCALIZATION;  Surgeon: Lillette Reid III, MD;  Location: [facility];  Service: General;  Laterality: Left;   BREAST REDUCTION SURGERY     COLONOSCOPY     CT CTA CORONARY W/CA SCORE W/CM &/OR WO/CM  01/19/2023   CAC score 0.  Noncalcified ostial large D1 plaque (50 to 69%-CT FFR negative/nonobstructive).  Otherwise minimal noncalcified plaque in the proximal LAD and proximal RCA.   EXPLORATORY LAPAROTOMY     NM MYOVIEW  LTD  07/07/2015   Normal Myocardial Perfusion Scan. Low risk lexiscan  nuclear study with minimal insignificant breast attenuation and normal myocardial perfusion and function; EF 53% without wall motion abnormalities and normal systolic thickening   TEMPOROMANDIBULAR JOINT SURGERY     TRANSTHORACIC ECHOCARDIOGRAM  03/11/2023   Normal LV size and function.  EF 60 to 65% with no RWMA.  GR 1 DD.  Normal RV size and function.  Normal RVP and RAP.  Moderate MAC but no MS or MR.   Normal AoV.   TUBAL LIGATION     UPPER GI ENDOSCOPY     x5    Current Medications: Current Facility-Administered Medications  Medication Dose Route Frequency Provider Last Rate  Last Admin   acetaminophen  (TYLENOL ) tablet 650 mg  650 mg Oral Q6H PRN Motley-Mangrum, Jadeka A, PMHNP       alum & mag hydroxide-simeth (MAALOX/MYLANTA) 200-200-20 MG/5ML suspension 30 mL  30 mL Oral Q4H PRN Motley-Mangrum, Jadeka A, PMHNP       bisacodyl  (DULCOLAX) EC tablet 5 mg  5 mg Oral Daily PRN Rubin Dais, MD  5 mg at 03/08/24 1406   chlordiazePOXIDE  (LIBRIUM ) capsule 25 mg  25 mg Oral Q6H PRN Aurelia Blotter, MD       fluticasone  furoate-vilanterol (BREO ELLIPTA ) 200-25 MCG/ACT 1 puff  1 puff Inhalation Daily Robel Wuertz, MD   1 puff at 03/09/24 1610   folic acid  (FOLVITE ) tablet 1 mg  1 mg Oral Daily Motley-Mangrum, Jadeka A, PMHNP   1 mg at 03/09/24 0855   levalbuterol  (XOPENEX ) nebulizer solution 0.63 mg  0.63 mg Inhalation Q8H PRN Aurelia Blotter, MD       loperamide  (IMODIUM ) capsule 2-4 mg  2-4 mg Oral PRN Taneah Masri, MD       LORazepam  (ATIVAN ) tablet 1 mg  1 mg Oral Q6H PRN Kelce Bouton, MD   1 mg at 03/08/24 2250   magnesium  hydroxide (MILK OF MAGNESIA) suspension 30 mL  30 mL Oral Daily PRN Motley-Mangrum, Jadeka A, PMHNP       multivitamin with minerals tablet 1 tablet  1 tablet Oral Daily Motley-Mangrum, Jadeka A, PMHNP   1 tablet at 03/09/24 0855   naproxen  (NAPROSYN ) tablet 250 mg  250 mg Oral BID WC Donel Osowski, MD   250 mg at 03/09/24 9604   nicotine  polacrilex (NICORETTE ) gum 2 mg  2 mg Oral PRN Biana Haggar, MD   2 mg at 03/08/24 1813   OLANZapine  (ZYPREXA ) injection 5 mg  5 mg Intramuscular TID PRN Motley-Mangrum, Jadeka A, PMHNP       OLANZapine  zydis (ZYPREXA ) disintegrating tablet 5 mg  5 mg Oral TID PRN Motley-Mangrum, Jadeka A, PMHNP       ondansetron  (ZOFRAN -ODT) disintegrating tablet 4 mg  4 mg Oral Q6H PRN Maliha Outten, MD       sodium chloride  tablet 1 g  1 g Oral BID WC Motley-Mangrum, Jadeka A, PMHNP   1 g at 03/09/24 5409   thiamine  (VITAMIN B1) tablet 100 mg  100 mg Oral Daily Motley-Mangrum, Jadeka A, PMHNP   100 mg at 03/09/24  8119   Or   thiamine  (VITAMIN B1) injection 100 mg  100 mg Intravenous Daily Motley-Mangrum, Jadeka A, PMHNP       Facility-Administered Medications Ordered in Other Encounters  Medication Dose Route Frequency Provider Last Rate Last Admin   chlorhexidine  (HIBICLENS ) 4 % liquid 1 application  1 application  Topical Once Lillette Reid III, MD       chlorhexidine  (HIBICLENS ) 4 % liquid 1 application  1 application  Topical Once Toth, Paul III, MD        Lab Results: No results found for this or any previous visit (from the past 48 hours).  Blood Alcohol level:  Lab Results  Component Value Date   ETH 282 (H) 03/05/2024   ETH <10 02/04/2024    Metabolic Disorder Labs: Lab Results  Component Value Date   HGBA1C 6.0 (H) 03/10/2021   MPG 125.5 03/10/2021   MPG 114.02 06/27/2018   Lab Results  Component Value Date   PROLACTIN 24.0 (H) 06/27/2018   Lab Results  Component Value Date   CHOL 272 (H) 01/13/2024   TRIG 84 01/13/2024   HDL 132 01/13/2024   CHOLHDL 2.1 01/13/2024   VLDL 23 06/27/2018   LDLCALC 127 (H) 01/13/2024   LDLCALC 121 (H) 01/24/2023    Physical Findings: AIMS:  , ,  ,  ,    CIWA:  CIWA-Ar Total: 2 COWS:      Psychiatric Specialty Exam:  Presentation  General Appearance:  Appropriate for  Environment; Casual  Eye Contact: Fair  Speech: Clear and Coherent  Speech Volume: Normal    Mood and Affect  Mood: Anxious  Affect: Appropriate   Thought Process  Thought Processes: Coherent  Descriptions of Associations:Intact  Orientation:Full (Time, Place and Person)  Thought Content:Illogical  Hallucinations: Denies  Ideas of Reference:None  Suicidal Thoughts: Denies  Homicidal Thoughts: Denies   Sensorium  Memory: Immediate Fair; Recent Fair; Remote Poor  Judgment: Impaired  Insight: Shallow   Executive Functions  Concentration: Poor  Attention Span: Poor  Recall: [employer] of  Knowledge: Fair  Language: Fair   Psychomotor Activity  Psychomotor Activity: No data recorded  Musculoskeletal: Strength & Muscle Tone: within normal limits Gait & Station: normal Assets  Assets: Manufacturing systems engineer; Financial Resources/Insurance; Social Support; Resilience    Physical Exam: Physical Exam Vitals and nursing note reviewed.    ROS Blood pressure 120/65, pulse 69, temperature 97.8 F (36.6 C), resp. rate 18, height 5\' 3"  (1.6 m), weight 62.8 kg, SpO2 100%. Body mass index is 24.53 kg/m.  Diagnosis: Principal Problem:   MDD (major depressive disorder) Mode arte   Clinical Decision Making: Patient with history of depression, anxiety, severe alcohol use admitted to inpatient psychiatric unit on IVC initiated by her friend after patient made statements of suicide in the context of being intoxicated.  Patient is requesting detox outpatient substance use resources   Treatment Plan Summary:   Safety and Monitoring:             -- involuntary admission to inpatient psychiatric unit for safety, stabilization and treatment             -- Daily contact with patient to assess and evaluate symptoms and progress in treatment             -- Patient's case to be discussed in multi-disciplinary team meeting             -- Observation Level: q15 minute checks             -- Vital signs:  q12 hours             -- Precautions: suicide, elopement, and assault   2. Psychiatric Diagnoses and Treatment:               Patient is declining antidepressants at this time CIWA protocol and added librium  PRN for detox and added patient home med ativan  1mg  Q6 hr prn for anxiety to prevent withdrawals from benzos. Patient is noted to have polypharmacy of Valium , Ativan , gabapentin , Ambien .  We will not be initiating all those and watch carefully on the unit   -- The risks/benefits/side-effects/alternatives to this medication were discussed in detail with the patient and time was  given for questions. The patient consents to medication trial.                -- Metabolic profile and EKG monitoring obtained while on an atypical antipsychotic (BMI: Lipid Panel: HbgA1c: QTc:)              -- Encouraged patient to participate in unit milieu and in scheduled group therapies                            3. Medical Issues Being Addressed:      4. Discharge Planning:              -- Social work and case management to assist  with discharge planning and identification of hospital follow-up needs prior to discharge             -- Estimated LOS: 5-7 days             -- Discharge Concerns: Need to establish a safety plan; Medication compliance and effectiveness             -- Discharge Goals: Return home with outpatient referrals follow ups  Aurelia Blotter, MD 03/09/2024, 3:32 PM

## 2024-03-09 NOTE — Group Note (Signed)
 Date:  03/09/2024 Time:  10:17 AM  Group Topic/Focus:  Unit expectations and House keeping rules.    Participation Level:  Active  Participation Quality:  Appropriate  Affect:  Appropriate  Cognitive:  Appropriate  Insight: Appropriate  Engagement in Group:  Engaged  Modes of Intervention:  Discussion  Additional Comments:  none  Merton Abts 03/09/2024, 10:17 AM

## 2024-03-09 NOTE — Group Note (Deleted)
 Date:  03/09/2024 Time:  9:45 PM  Group Topic/Focus:  Wrap-Up Group:   The focus of this group is to help patients review their daily goal of treatment and discuss progress on daily workbooks.     Participation Level:  {BHH PARTICIPATION EAVWU:98119}  Participation Quality:  {BHH PARTICIPATION QUALITY:22265}  Affect:  {BHH AFFECT:22266}  Cognitive:  {BHH COGNITIVE:22267}  Insight: {BHH Insight2:20797}  Engagement in Group:  {BHH ENGAGEMENT IN JYNWG:95621}  Modes of Intervention:  {BHH MODES OF INTERVENTION:22269}  Additional Comments:  ***  [name],Preciosa Bundrick E 03/09/2024, 9:45 PM

## 2024-03-10 DIAGNOSIS — F10930 Alcohol use, unspecified with withdrawal, uncomplicated: Secondary | ICD-10-CM | POA: Diagnosis not present

## 2024-03-10 DIAGNOSIS — F331 Major depressive disorder, recurrent, moderate: Secondary | ICD-10-CM | POA: Diagnosis not present

## 2024-03-10 LAB — COMPREHENSIVE METABOLIC PANEL WITH GFR
ALT: 36 U/L (ref 0–44)
AST: 38 U/L (ref 15–41)
Albumin: 4.4 g/dL (ref 3.5–5.0)
Alkaline Phosphatase: 54 U/L (ref 38–126)
Anion gap: 10 (ref 5–15)
BUN: 12 mg/dL (ref 8–23)
CO2: 23 mmol/L (ref 22–32)
Calcium: 9.7 mg/dL (ref 8.9–10.3)
Chloride: 101 mmol/L (ref 98–111)
Creatinine, Ser: 0.74 mg/dL (ref 0.44–1.00)
GFR, Estimated: >60 mL/min (ref >60)
Glucose, Bld: 109 mg/dL — ABNORMAL HIGH (ref 70–99)
Potassium: 3.8 mmol/L (ref 3.5–5.1)
Sodium: 134 mmol/L — ABNORMAL LOW (ref 135–145)
Total Bilirubin: 0.7 mg/dL (ref 0.0–1.2)
Total Protein: 7 g/dL (ref 6.5–8.1)

## 2024-03-10 MED ORDER — ONDANSETRON 4 MG PO TBDP
4.0000 mg | ORAL_TABLET | Freq: Two times a day (BID) | ORAL | Status: DC
  Administered 2024-03-10 – 2024-03-12 (×4): 4 mg via ORAL
  Filled 2024-03-10 (×5): qty 1

## 2024-03-10 MED ORDER — LORAZEPAM 1 MG PO TABS
1.0000 mg | ORAL_TABLET | Freq: Three times a day (TID) | ORAL | Status: DC
  Administered 2024-03-10 – 2024-03-12 (×7): 1 mg via ORAL
  Filled 2024-03-10 (×7): qty 1

## 2024-03-10 MED ORDER — GABAPENTIN 100 MG PO CAPS
200.0000 mg | ORAL_CAPSULE | Freq: Two times a day (BID) | ORAL | Status: DC
  Administered 2024-03-10 – 2024-03-12 (×5): 200 mg via ORAL
  Filled 2024-03-10 (×6): qty 2

## 2024-03-10 NOTE — Group Note (Signed)
 Physical/Occupational Therapy Group Note  Group Topic: Functional, Dynamic Balance   Group Date: 03/10/2024 Start Time: 1300 End Time: 1340 Facilitators: Yvonna Brun, Otelia Blew, PT   Group Description: Group discussed impact of balance on safety and independence with functional tasks.  Identified and discussed any self-perceived balance deficits to personalize information.  Discussed and reviewed strategies to address/improve balance deficits: use of assist devices, activity pacing/energy conservation, environment/home safety modifications, focusing attention/minimizing distraction.  Reviewed and participated with standing balance therex designed to target dynamic balance reactions and LE strength/stability. Allowed time for questions and further discussion on any balance or mobility concerns/needs.  Therapeutic Goal(s):  Identify and discuss any individual balance deficits and functional implications. Identify and discuss any environmental/home safety modifications that can optimize balance and safety for mobility within the home. Demonstrate understanding and performance of standing therex designed to target dynamic balance deficits.  Individual Participation: Pt participated actively and appropriately both during the discussion and activity portions of the session.    Participation Level: Active and Engaged   Participation Quality: Minimal Cues   Behavior: Alert and Appropriate   Speech/Thought Process: Coherent and Focused   Affect/Mood: Appropriate   Insight: Good   Judgement: Good   Modes of Intervention: Activity, Discussion, and Education  Patient Response to Interventions:  Attentive, Engaged, Interested , and Receptive   Plan: Continue to engage patient in PT/OT groups 1 - 2x/week.  Lavenia Post PT, DPT 03/10/24, 2:08 PM

## 2024-03-10 NOTE — Plan of Care (Signed)

## 2024-03-10 NOTE — Progress Notes (Signed)
 [facility] MD Progress Note  03/10/2024 8:48 PM Sarah Reeves  MRN:  [id number] Sarah Reeves is a 63 year old female who presents to [facility] escorted by [employer] under IVC. Patient has a reported history of MDD,GAD, and Alcohol abuse.   Per IVC "Respondent stated multiple times that they would kill themselves. Respondent stated she was going to run into traffic. Respondent is currently experiencing a medical emergency confirmed by EMS. Respondent is refusing to seek treatment. Respondent is a danger to self. Respondent is a danger to others."  Subjective:  Chart reviewed, case discussed in multidisciplinary meeting, patient seen during rounds.  .  Today patient wanted to talk to the provider in her room.  She reports that she feels trapped here and wants to go home.  Patient and provider talked about her family connections especially her brother and patient became tearful when provider informed her that her brother wants to be there for her and wants to pick her up from the hospital.  Patient reports that when her husband died none of her family members called are checked on her constantly and she started feeling lonely that led up to the alcohol use and hanging out with around crowds.  Patient again asked the provider about her Ativan  home dosage and the plan of getting her Ativan  1 mg 4 times per day as needed.  Patient was educated that as needed medication needs to be asked when she feels anxious and nervous will give it to her.  Patient had some concerns about CIWA scores not being asked and Librium  never offered to her in spite of knowing she is in the hospital for alcohol and benzo detox.  Provider reviewed the chart and changed Ativan  to 1 mg scheduled 3 times daily so that patient is not going through any withdrawal from benzos as she has not been getting any PRNs except during the night shift.  Patient denies SI/HI/plan and denies hallucinations.  Patient also requested to get back on gabapentin  to help with  her anxiety and provider educated that gabapentin  also helps with chronic alcoholism.  Sleep: Fair  Appetite:  Fair  Past Psychiatric History: see h&P Family History:  Family History  Problem Relation Age of Onset   Diabetes Mother    Lung disease Mother    Heart disease Mother    Breast cancer Mother    Heart disease Father    Colon cancer Father    Hypothyroidism Brother    Alzheimer's disease Maternal Grandmother    Alzheimer's disease Maternal Aunt    Skin cancer Maternal Aunt    Stomach cancer Neg Hx    Esophageal cancer Neg Hx    Social History:  Social History   Substance and Sexual Activity  Alcohol Use Yes   Alcohol/week: 49.0 standard drinks of alcohol   Types: 49 Shots of liquor per week   Comment: Drinking 5-6 drinks per day (grey goose).  02/10/2024 hfb     Social History   Substance and Sexual Activity  Drug Use No    Social History   Socioeconomic History   Marital status: Married    Spouse name: Not on file   Number of children: 0   Years of education: 12   Highest education level: Not on file  Occupational History   Occupation: Retired   Occupation: retired  Tobacco Use   Smoking status: Every Day    Current packs/day: 1.00    Average packs/day: 1 pack/day for 47.0 years (47.0  ttl pk-yrs)    Types: Cigarettes    Passive exposure: Current   Smokeless tobacco: Never   Tobacco comments:    Had a bad break up with boyfriend, she has been smoking and drinking.  10 cigarettes/day.  02/10/2024 hfb  Vaping Use   Vaping status: Never Used  Substance and Sexual Activity   Alcohol use: Yes    Alcohol/week: 49.0 standard drinks of alcohol    Types: 49 Shots of liquor per week    Comment: Drinking 5-6 drinks per day (grey goose).  02/10/2024 hfb   Drug use: No   Sexual activity: Yes    Birth control/protection: Surgical  Other Topics Concern   Not on file  Social History Narrative   Patient is right handed.   Four cups caffeine per day.   Lives  at home with husband.   Social Drivers of Corporate investment banker Strain: Not on file  Food Insecurity: No Food Insecurity (03/06/2024)   Hunger Vital Sign    Worried About Running Out of Food in the Last Year: Never true    Ran Out of Food in the Last Year: Never true  Transportation Needs: No Transportation Needs (03/06/2024)   PRAPARE - Administrator, Civil Service (Medical): No    Lack of Transportation (Non-Medical): No  Physical Activity: Not on file  Stress: Not on file  Social Connections: Unknown (02/25/2023)   [facility]   Social Network    Social Network: Not on file   Past Medical History:  Past Medical History:  Diagnosis Date   Allergy     Angio-edema    Anxiety    Arthritis    Asthma    Clostridioides difficile infection    Complication of anesthesia    takes alot to sedated   COPD (chronic obstructive pulmonary disease) (HCC)    Depression    Emphysema of lung (HCC)    Fibromyalgia    GERD (gastroesophageal reflux disease)    Hepatitis C    (Dr. Andriette Keeling) Treated with Harvoni March-May 2016   Herpes simplex    Hyperlipemia    Hypertension    Insomnia    Mental disorder    Neuropathy    Osteoporosis    Oxygen  deficiency    PONV (postoperative nausea and vomiting)    Sleep apnea    Substance abuse (HCC)    Urticaria     Past Surgical History:  Procedure Laterality Date   AUGMENTATION MAMMAPLASTY Bilateral    BREAST BIOPSY Left 11/2014   BREAST BIOPSY Left 12/2014   BREAST ENHANCEMENT SURGERY     BREAST LUMPECTOMY WITH RADIOACTIVE SEED LOCALIZATION Left 12/26/2014   Procedure: LEFT BREAST LUMPECTOMY WITH RADIOACTIVE SEED LOCALIZATION;  Surgeon: Lillette Reid III, MD;  Location: [facility];  Service: General;  Laterality: Left;   BREAST REDUCTION SURGERY     COLONOSCOPY     CT CTA CORONARY W/CA SCORE W/CM &/OR WO/CM  01/19/2023   CAC score 0.  Noncalcified ostial large D1 plaque (50 to 69%-CT  FFR negative/nonobstructive).  Otherwise minimal noncalcified plaque in the proximal LAD and proximal RCA.   EXPLORATORY LAPAROTOMY     NM MYOVIEW  LTD  07/07/2015   Normal Myocardial Perfusion Scan. Low risk lexiscan  nuclear study with minimal insignificant breast attenuation and normal myocardial perfusion and function; EF 53% without wall motion abnormalities and normal systolic thickening   TEMPOROMANDIBULAR JOINT SURGERY     TRANSTHORACIC ECHOCARDIOGRAM  03/11/2023   Normal LV size and function.  EF 60 to 65% with no RWMA.  GR 1 DD.  Normal RV size and function.  Normal RVP and RAP.  Moderate MAC but no MS or MR.   Normal AoV.   TUBAL LIGATION     UPPER GI ENDOSCOPY     x5    Current Medications: Current Facility-Administered Medications  Medication Dose Route Frequency Provider Last Rate Last Admin   acetaminophen  (TYLENOL ) tablet 650 mg  650 mg Oral Q6H PRN Motley-Mangrum, Jadeka A, PMHNP       alum & mag hydroxide-simeth (MAALOX/MYLANTA) 200-200-20 MG/5ML suspension 30 mL  30 mL Oral Q4H PRN Motley-Mangrum, Jadeka A, PMHNP       bisacodyl  (DULCOLAX) EC tablet 5 mg  5 mg Oral Daily PRN Kalleigh Harbor, MD   5 mg at 03/08/24 1406   fluticasone  furoate-vilanterol (BREO ELLIPTA ) 200-25 MCG/ACT 1 puff  1 puff Inhalation Daily Shirle Provencal, MD   1 puff at 03/10/24 0942   folic acid  (FOLVITE ) tablet 1 mg  1 mg Oral Daily Motley-Mangrum, Jadeka A, PMHNP   1 mg at 03/10/24 1610   gabapentin  (NEURONTIN ) capsule 200 mg  200 mg Oral BID Kavontae Pritchard, MD   200 mg at 03/10/24 1212   levalbuterol  (XOPENEX ) nebulizer solution 0.63 mg  0.63 mg Inhalation Q8H PRN Aurelia Blotter, MD       loperamide  (IMODIUM ) capsule 2-4 mg  2-4 mg Oral PRN Dazja Houchin, MD       LORazepam  (ATIVAN ) tablet 1 mg  1 mg Oral TID Kenyia Wambolt, MD   1 mg at 03/10/24 2023   magnesium  hydroxide (MILK OF MAGNESIA) suspension 30 mL  30 mL Oral Daily PRN Motley-Mangrum, Jadeka A, PMHNP       multivitamin with minerals  tablet 1 tablet  1 tablet Oral Daily Motley-Mangrum, Jadeka A, PMHNP   1 tablet at 03/10/24 0939   naproxen  (NAPROSYN ) tablet 250 mg  250 mg Oral BID WC Tavin Vernet, MD   250 mg at 03/10/24 1710   nicotine  polacrilex (NICORETTE ) gum 2 mg  2 mg Oral PRN Vy Badley, MD   2 mg at 03/10/24 1418   OLANZapine  (ZYPREXA ) injection 5 mg  5 mg Intramuscular TID PRN Motley-Mangrum, Jadeka A, PMHNP       OLANZapine  zydis (ZYPREXA ) disintegrating tablet 5 mg  5 mg Oral TID PRN Motley-Mangrum, Jadeka A, PMHNP       ondansetron  (ZOFRAN -ODT) disintegrating tablet 4 mg  4 mg Oral Q6H PRN Jatziri Goffredo, MD       ondansetron  (ZOFRAN -ODT) disintegrating tablet 4 mg  4 mg Oral Q12H Yolinda Duerr, MD   4 mg at 03/10/24 1212   sodium chloride  tablet 1 g  1 g Oral BID WC Motley-Mangrum, Jadeka A, PMHNP   1 g at 03/10/24 1710   thiamine  (VITAMIN B1) tablet 100 mg  100 mg Oral Daily Motley-Mangrum, Jadeka A, PMHNP   100 mg at 03/10/24 9604   Or   thiamine  (VITAMIN B1) injection 100 mg  100 mg Intravenous Daily Motley-Mangrum, Jadeka A, PMHNP       Facility-Administered Medications Ordered in Other Encounters  Medication Dose Route Frequency Provider Last Rate Last Admin   chlorhexidine  (HIBICLENS ) 4 % liquid 1 application  1 application  Topical Once Toth, Paul III, MD       chlorhexidine  (HIBICLENS ) 4 % liquid 1 application  1 application  Topical Once Toth, Paul III, MD  Lab Results: No results found for this or any previous visit (from the past 48 hours).  Blood Alcohol level:  Lab Results  Component Value Date   ETH 282 (H) 03/05/2024   ETH <10 02/04/2024    Metabolic Disorder Labs: Lab Results  Component Value Date   HGBA1C 6.0 (H) 03/10/2021   MPG 125.5 03/10/2021   MPG 114.02 06/27/2018   Lab Results  Component Value Date   PROLACTIN 24.0 (H) 06/27/2018   Lab Results  Component Value Date   CHOL 272 (H) 01/13/2024   TRIG 84 01/13/2024   HDL 132 01/13/2024   CHOLHDL 2.1  01/13/2024   VLDL 23 06/27/2018   LDLCALC 127 (H) 01/13/2024   LDLCALC 121 (H) 01/24/2023    Physical Findings: AIMS:  , ,  ,  ,    CIWA:  CIWA-Ar Total: 2 COWS:      Psychiatric Specialty Exam:  Presentation  General Appearance:  Appropriate for Environment; Casual  Eye Contact: Fair  Speech: Clear and Coherent  Speech Volume: Normal    Mood and Affect  Mood: Anxious  Affect: Appropriate   Thought Process  Thought Processes: Coherent  Descriptions of Associations:Intact  Orientation:Full (Time, Place and Person)  Thought Content:Illogical  Hallucinations: Denies  Ideas of Reference:None  Suicidal Thoughts: Denies  Homicidal Thoughts: Denies   Sensorium  Memory: Immediate Fair; Recent Fair; Remote Poor  Judgment: Impaired  Insight: Shallow   Executive Functions  Concentration: Poor  Attention Span: Poor  Recall: [employer] of Knowledge: Fair  Language: Fair   Psychomotor Activity  Psychomotor Activity: No data recorded  Musculoskeletal: Strength & Muscle Tone: within normal limits Gait & Station: normal Assets  Assets: Manufacturing systems engineer; Financial Resources/Insurance; Social Support; Resilience    Physical Exam: Physical Exam Vitals and nursing note reviewed.    ROS Blood pressure (!) 153/74, pulse 70, temperature 98.1 F (36.7 C), resp. rate 16, height 5\' 3"  (1.6 m), weight 62.8 kg, SpO2 100%. Body mass index is 24.53 kg/m.  Diagnosis: Principal Problem:   MDD (major depressive disorder) Mode arte   Clinical Decision Making: Patient with history of depression, anxiety, severe alcohol use admitted to inpatient psychiatric unit on IVC initiated by her friend after patient made statements of suicide in the context of being intoxicated.  Patient is requesting detox outpatient substance use resources   Treatment Plan Summary:   Safety and Monitoring:             -- involuntary admission to inpatient  psychiatric unit for safety, stabilization and treatment             -- Daily contact with patient to assess and evaluate symptoms and progress in treatment             -- Patient's case to be discussed in multi-disciplinary team meeting             -- Observation Level: q15 minute checks             -- Vital signs:  q12 hours             -- Precautions: suicide, elopement, and assault   2. Psychiatric Diagnoses and Treatment:               Patient is declining antidepressants at this time CIWA protocol and   librium  PRN for detox- discontinued as patient's home ativan  1mg   is scheduled TID now  Gabapentin  is added back to 200mg  BID to help with  chronic alcoholism/anxiety/neuropathic pain  Per chart, on admission, Patient is noted to have polypharmacy of Valium , Ativan , gabapentin , Ambien .     -- The risks/benefits/side-effects/alternatives to this medication were discussed in detail with the patient and time was given for questions. The patient consents to medication trial.                -- Metabolic profile and EKG monitoring obtained while on an atypical antipsychotic (BMI: Lipid Panel: HbgA1c: QTc:)              -- Encouraged patient to participate in unit milieu and in scheduled group therapies                            3. Medical Issues Being Addressed:      4. Discharge Planning:              -- Social work and case management to assist with discharge planning and identification of hospital follow-up needs prior to discharge             -- Estimated LOS: 5-7 days             -- Discharge Concerns: Need to establish a safety plan; Medication compliance and effectiveness             -- Discharge Goals: Return home with outpatient referrals follow ups  Aurelia Blotter, MD 03/10/2024, 8:48 PM

## 2024-03-10 NOTE — Group Note (Signed)
 Date:  03/10/2024 Time:  11:49 AM  Group Topic/Focus:  Chair exercises The purpose of this group is to allow patient to move their bodies while doing easy exercise movements    Participation Level:  Did Not Attend  Sarah Reeves 03/10/2024, 11:49 AM

## 2024-03-10 NOTE — Progress Notes (Signed)
   03/10/24 1054  Psych Admission Type (Psych Patients Only)  Admission Status Involuntary  Psychosocial Assessment  Patient Complaints Anxiety  Eye Contact Fair  Facial Expression Animated  Affect Anxious  Speech Logical/coherent  Interaction Assertive;Needy  Motor Activity Slow  Appearance/Hygiene In scrubs  Thought Process  Coherency WDL  Content WDL  Delusions None reported or observed  Perception WDL  Hallucination None reported or observed  Judgment WDL  Confusion WDL  Danger to Self  Current suicidal ideation? Denies  Danger to Others  Danger to Others None reported or observed

## 2024-03-10 NOTE — Group Note (Signed)
 Date:  03/10/2024 Time:  9:26 PM  Group Topic/Focus:  Self Care:   The focus of this group is to help patients understand the importance of self-care in order to improve or restore emotional, physical, spiritual, interpersonal, and financial health.    Participation Level:  Active  Participation Quality:  Appropriate  Affect:  Appropriate  Cognitive:  Appropriate  Insight: Appropriate  Engagement in Group:  Engaged  Modes of Intervention:  Education  Additional Comments:    Sherlie Distance 03/10/2024, 9:26 PM

## 2024-03-10 NOTE — Plan of Care (Signed)
   Problem: Education: Goal: Knowledge of [place] [facility] Education information/materials will improve Outcome: Progressing Goal: Emotional status will improve Outcome: Progressing

## 2024-03-10 NOTE — Progress Notes (Signed)
   03/09/24 2000  Psych Admission Type (Psych Patients Only)  Admission Status Involuntary  Psychosocial Assessment  Patient Complaints Anxiety  Eye Contact Fair  Facial Expression Animated  Affect Anxious  Speech Logical/coherent  Interaction Assertive;Needy  Motor Activity Slow  Appearance/Hygiene In scrubs  Behavior Characteristics Anxious  Mood Anxious  Aggressive Behavior  Effect No apparent injury  Thought Process  Coherency WDL  Content WDL  Delusions None reported or observed  Perception WDL  Hallucination None reported or observed  Judgment WDL  Confusion WDL  Danger to Self  Current suicidal ideation? Denies  Danger to Others  Danger to Others None reported or observed

## 2024-03-11 DIAGNOSIS — F10930 Alcohol use, unspecified with withdrawal, uncomplicated: Secondary | ICD-10-CM | POA: Diagnosis not present

## 2024-03-11 DIAGNOSIS — F331 Major depressive disorder, recurrent, moderate: Secondary | ICD-10-CM | POA: Diagnosis not present

## 2024-03-11 MED ORDER — LORAZEPAM 1 MG PO TABS: 1.0000 mg | ORAL_TABLET | Freq: Three times a day (TID) | ORAL | 0 refills | Status: DC

## 2024-03-11 MED ORDER — GABAPENTIN 100 MG PO CAPS: 200.0000 mg | ORAL_CAPSULE | Freq: Two times a day (BID) | ORAL | 0 refills | Status: DC

## 2024-03-11 MED ORDER — FOLIC ACID 1 MG PO TABS: 1.0000 mg | ORAL_TABLET | Freq: Every day | ORAL | 0 refills | Status: AC

## 2024-03-11 MED ORDER — VITAMIN B-1 100 MG PO TABS: 100.0000 mg | ORAL_TABLET | Freq: Every day | ORAL | 0 refills | Status: AC

## 2024-03-11 MED ORDER — ADULT MULTIVITAMIN W/MINERALS CH: 1.0000 | ORAL_TABLET | Freq: Every day | ORAL | 0 refills | Status: DC

## 2024-03-11 MED ORDER — LEVALBUTEROL HCL 0.63 MG/3ML IN NEBU: 0.6300 mg | INHALATION_SOLUTION | Freq: Three times a day (TID) | RESPIRATORY_TRACT | 12 refills | Status: AC | PRN

## 2024-03-11 MED ORDER — FLUTICASONE FUROATE-VILANTEROL 200-25 MCG/ACT IN AEPB: 1.0000 | INHALATION_SPRAY | Freq: Every day | RESPIRATORY_TRACT | 0 refills | Status: DC

## 2024-03-11 NOTE — BHH Counselor (Signed)
 CS[facility]  per pt's request.   [facility] reports they must reach out to patient to schedule.   CSW informed pt.   Derrill Flirt, MSW, [place] 03/11/2024 3:05 PM

## 2024-03-11 NOTE — BHH Counselor (Signed)
 CSW contacted pt's brother, Janese Medicine, [phone] to confirm pt's discharge tomorrow.   Donavon Fudge reports that he will be here to pick pt up tomorrow around 12:30.   CSW provided directions to pt's brother for pick-up.   CSW provided SPE to pt's brother.   Derrill Flirt, MSW, [place] 03/11/2024 2:55 PM

## 2024-03-11 NOTE — Group Note (Signed)
 Date:  03/11/2024 Time:  10:49 AM  Group Topic/Focus:  Fresh air therapyoutdoors.    Participation Level:  Active  Participation Quality:  Appropriate  Affect:  Appropriate  Cognitive:  Appropriate  Insight: Appropriate  Engagement in Group:  Engaged  Modes of Intervention:  Discussion  Additional Comments:  none  Merton Abts 03/11/2024, 10:49 AM

## 2024-03-11 NOTE — BHH Suicide Risk Assessment (Signed)
 BHH INPATIENT:  Family/Significant Other Suicide Prevention Education  Suicide Prevention Education:  Education Completed; Janese Medicine, [phone],  (name of family member/significant other) has been identified by the patient as the family member/significant other with whom the patient will be residing, and identified as the person(s) who will aid the patient in the event of a mental health crisis (suicidal ideations/suicide attempt).  With written consent from the patient, the family member/significant other has been provided the following suicide prevention education, prior to the and/or following the discharge of the patient.  The suicide prevention education provided includes the following: Suicide risk factors Suicide prevention and interventions National Suicide Hotline telephone number [facility] assessment telephone number [facility] Emergency Assistance 911 [facility] and/or Residential Mobile Crisis Unit telephone number  Request made of family/significant other to: Remove weapons (e.g., guns, rifles, knives), all items previously/currently identified as safety concern.   Remove drugs/medications (over-the-counter, prescriptions, illicit drugs), all items previously/currently identified as a safety concern.  The family member/significant other verbalizes understanding of the suicide prevention education information provided.  The family member/significant other agrees to remove the items of safety concern listed above.  Claudio Culver 03/11/2024, 2:55 PM

## 2024-03-11 NOTE — Progress Notes (Signed)
 Sarah Reeves is polite and cooperative with staff and compliant w all medications as ordered. Pt voices no complaints and is interactive on the milieu with peers in the day room. Will continue to monitor.

## 2024-03-11 NOTE — Plan of Care (Signed)
Slowly progressing

## 2024-03-11 NOTE — Group Note (Signed)
 Recreation Therapy Group Note   Group Topic:General Recreation  Group Date: 03/11/2024 Start Time: 1400 End Time: 1500 Facilitators: Deatrice Factor, LRT, CTRS  Location: Courtyard  Group Description: Outdoor Recreation. Patients had the option to play corn hole, ring toss, bowling or listening to music while outside in the courtyard getting fresh air and sunlight. Patients helped water and prune the raised garden beds. LRT and patients discussed things that they enjoy doing in their free time outside of the hospital. LRT encouraged patients to drink water after being active and getting their heart rate up.   Goal Area(s) Addressed: Patient will identify leisure interests.  Patient will practice healthy decision making. Patient will engage in recreation activity.   Affect/Mood: Appropriate   Participation Level: Active and Engaged   Participation Quality: Independent   Behavior: Calm and Cooperative   Speech/Thought Process: Coherent   Insight: Good   Judgement: Good   Modes of Intervention: Exploration, Music, and Rapport Building   Patient Response to Interventions:  Attentive, Engaged, Interested , and Receptive   Education Outcome:  Acknowledges education   Clinical Observations/Individualized Feedback: Sarah Reeves was active in their participation of session activities and group discussion. Pt chose to play cornhole while outside in group. Pt interacted well with LRT and peers duration of session.    Plan: Continue to engage patient in RT group sessions 2-3x/week.   Deatrice Factor, LRT, CTRS 03/11/2024 5:16 PM

## 2024-03-11 NOTE — BHH Suicide Risk Assessment (Signed)
 [facility] Discharge Suicide Risk Assessment   Principal Problem: MDD (major depressive disorder) Discharge Diagnoses: Principal Problem:   MDD (major depressive disorder)   Total Time spent with patient: 30 minutes  Musculoskeletal: Strength & Muscle Tone: within normal limits Gait & Station: normal Patient leans: N/A  Psychiatric Specialty Exam  Presentation  General Appearance:  Appropriate for Environment; Casual  Eye Contact: Fair  Speech: Clear and Coherent  Speech Volume: Normal  Handedness: Right   Mood and Affect  Mood: Anxious  Duration of Depression Symptoms: Greater than two weeks  Affect: Appropriate   Thought Process  Thought Processes: Coherent  Descriptions of Associations:Intact  Orientation:Full (Time, Place and Person)  Thought Content:Illogical  History of Schizophrenia/Schizoaffective disorder:No  Duration of Psychotic Symptoms:No data recorded Hallucinations:No data recorded Ideas of Reference:None  Suicidal Thoughts:No data recorded Homicidal Thoughts:No data recorded  Sensorium  Memory: Immediate Fair; Recent Fair; Remote Poor  Judgment: Impaired  Insight: Shallow   Executive Functions  Concentration: Poor  Attention Span: Poor  Recall: [employer] of Knowledge: Fair  Language: Fair   Psychomotor Activity  Psychomotor Activity:No data recorded  Assets  Assets: Communication Skills; Financial Resources/Insurance; Social Support; Resilience   Sleep  Sleep:No data recorded  Physical Exam: Physical Exam ROS Blood pressure (!) 143/70, pulse 83, temperature 97.8 F (36.6 C), resp. rate 16, height 5\' 3"  (1.6 m), weight 62.8 kg, SpO2 97%. Body mass index is 24.53 kg/m.  Mental Status Per Nursing Assessment::   On Admission:  NA  Demographic Factors:  Caucasian  Loss Factors: Decrease in vocational status  Historical Factors: Impulsivity  Risk Reduction Factors:   Sense of responsibility  to family, Positive social support, Positive therapeutic relationship, and Positive coping skills or problem solving skills  Continued Clinical Symptoms:  Depression:   Comorbid alcohol abuse/dependence  Cognitive Features That Contribute To Risk:  None    Suicide Risk:  Minimal: No identifiable suicidal ideation.  Patients presenting with no risk factors but with morbid ruminations; may be classified as minimal risk based on the severity of the depressive symptoms   Follow-up Information     [facility] Go on 04/13/2024.   Why: Your appointment is scheduled for 1:30 PM for an in-person visit. You must fill out your new pt forms prior to your appoint so please arrive at least 15 minutes prior. Please also remember to bring your insurance card. Contact information: [address] [place] [address] [phone]                 Plan Of Care/Follow-up recommendations:  Activity:  As tolerated  Aurelia Blotter, MD 03/11/2024, 9:01 PM

## 2024-03-11 NOTE — Progress Notes (Signed)
 [facility] MD Progress Note  03/11/2024 12:40 PM Sarah Reeves  MRN:  [id number] Sarah Reeves is a 63 year old female who presents to [facility] escorted by [employer] under IVC. Patient has a reported history of MDD,GAD, and Alcohol abuse.   Per IVC "Respondent stated multiple times that they would kill themselves. Respondent stated she was going to run into traffic. Respondent is currently experiencing a medical emergency confirmed by EMS. Respondent is refusing to seek treatment. Respondent is a danger to self. Respondent is a danger to others."  Subjective:  Chart reviewed, case discussed in multidisciplinary meeting, patient seen during rounds.  .  Patient reports that she had good night sleep once her medications got regulated.  She also talked with the provider about her discharge being on Friday as her brother is coming from [place]  to pick her up.  Patient reports that she has plans of paying her bills and wrapping things at home and probably decide to stay with her brother for few days.  She reports her anxiety is much improved.  She denies SI/HI/plan and denies hallucinations.  Provider discussed the plan that she will be continued on Ativan  1 mg 3 times daily and gabapentin  200 mg twice daily to help with the anxiety and benzo withdrawal, plan to continue fluoxetine 10 mg to prevent withdrawal from SSRIs.  Patient was educated to follow-up with outpatient psychiatrist and adjust the medications  Sleep: Fair  Appetite:  Fair  Past Psychiatric History: see h&P Family History:  Family History  Problem Relation Age of Onset   Diabetes Mother    Lung disease Mother    Heart disease Mother    Breast cancer Mother    Heart disease Father    Colon cancer Father    Hypothyroidism Brother    Alzheimer's disease Maternal Grandmother    Alzheimer's disease Maternal Aunt    Skin cancer Maternal Aunt    Stomach cancer Neg Hx    Esophageal cancer Neg Hx    Social History:  Social History    Substance and Sexual Activity  Alcohol Use Yes   Alcohol/week: 49.0 standard drinks of alcohol   Types: 49 Shots of liquor per week   Comment: Drinking 5-6 drinks per day (grey goose).  02/10/2024 hfb     Social History   Substance and Sexual Activity  Drug Use No    Social History   Socioeconomic History   Marital status: Married    Spouse name: Not on file   Number of children: 0   Years of education: 12   Highest education level: Not on file  Occupational History   Occupation: Retired   Occupation: retired  Tobacco Use   Smoking status: Every Day    Current packs/day: 1.00    Average packs/day: 1 pack/day for 47.0 years (47.0 ttl pk-yrs)    Types: Cigarettes    Passive exposure: Current   Smokeless tobacco: Never   Tobacco comments:    Had a bad break up with boyfriend, she has been smoking and drinking.  10 cigarettes/day.  02/10/2024 hfb  Vaping Use   Vaping status: Never Used  Substance and Sexual Activity   Alcohol use: Yes    Alcohol/week: 49.0 standard drinks of alcohol    Types: 49 Shots of liquor per week    Comment: Drinking 5-6 drinks per day (grey goose).  02/10/2024 hfb   Drug use: No   Sexual activity: Yes    Birth control/protection: Surgical  Other  Topics Concern   Not on file  Social History Narrative   Patient is right handed.   Four cups caffeine per day.   Lives at home with husband.   Social Drivers of Corporate investment banker Strain: Not on file  Food Insecurity: No Food Insecurity (03/06/2024)   Hunger Vital Sign    Worried About Running Out of Food in the Last Year: Never true    Ran Out of Food in the Last Year: Never true  Transportation Needs: No Transportation Needs (03/06/2024)   PRAPARE - Administrator, Civil Service (Medical): No    Lack of Transportation (Non-Medical): No  Physical Activity: Not on file  Stress: Not on file  Social Connections: Unknown (02/25/2023)   [facility]    Social Network    Social Network: Not on file   Past Medical History:  Past Medical History:  Diagnosis Date   Allergy     Angio-edema    Anxiety    Arthritis    Asthma    Clostridioides difficile infection    Complication of anesthesia    takes alot to sedated   COPD (chronic obstructive pulmonary disease) (HCC)    Depression    Emphysema of lung (HCC)    Fibromyalgia    GERD (gastroesophageal reflux disease)    Hepatitis C    (Dr. Andriette Keeling) Treated with Harvoni March-May 2016   Herpes simplex    Hyperlipemia    Hypertension    Insomnia    Mental disorder    Neuropathy    Osteoporosis    Oxygen  deficiency    PONV (postoperative nausea and vomiting)    Sleep apnea    Substance abuse (HCC)    Urticaria     Past Surgical History:  Procedure Laterality Date   AUGMENTATION MAMMAPLASTY Bilateral    BREAST BIOPSY Left 11/2014   BREAST BIOPSY Left 12/2014   BREAST ENHANCEMENT SURGERY     BREAST LUMPECTOMY WITH RADIOACTIVE SEED LOCALIZATION Left 12/26/2014   Procedure: LEFT BREAST LUMPECTOMY WITH RADIOACTIVE SEED LOCALIZATION;  Surgeon: Lillette Reid III, MD;  Location: [facility];  Service: General;  Laterality: Left;   BREAST REDUCTION SURGERY     COLONOSCOPY     CT CTA CORONARY W/CA SCORE W/CM &/OR WO/CM  01/19/2023   CAC score 0.  Noncalcified ostial large D1 plaque (50 to 69%-CT FFR negative/nonobstructive).  Otherwise minimal noncalcified plaque in the proximal LAD and proximal RCA.   EXPLORATORY LAPAROTOMY     NM MYOVIEW  LTD  07/07/2015   Normal Myocardial Perfusion Scan. Low risk lexiscan  nuclear study with minimal insignificant breast attenuation and normal myocardial perfusion and function; EF 53% without wall motion abnormalities and normal systolic thickening   TEMPOROMANDIBULAR JOINT SURGERY     TRANSTHORACIC ECHOCARDIOGRAM  03/11/2023   Normal LV size and function.  EF 60 to 65% with no RWMA.  GR 1 DD.  Normal RV size and function.  Normal RVP and  RAP.  Moderate MAC but no MS or MR.   Normal AoV.   TUBAL LIGATION     UPPER GI ENDOSCOPY     x5    Current Medications: Current Facility-Administered Medications  Medication Dose Route Frequency Provider Last Rate Last Admin   acetaminophen  (TYLENOL ) tablet 650 mg  650 mg Oral Q6H PRN Motley-Mangrum, Jadeka A, PMHNP       alum & mag hydroxide-simeth (MAALOX/MYLANTA) 200-200-20 MG/5ML suspension 30 mL  30 mL Oral  Q4H PRN Motley-Mangrum, Jadeka A, PMHNP       bisacodyl  (DULCOLAX) EC tablet 5 mg  5 mg Oral Daily PRN Nikita Humble, MD   5 mg at 03/08/24 1406   fluticasone  furoate-vilanterol (BREO ELLIPTA ) 200-25 MCG/ACT 1 puff  1 puff Inhalation Daily Karleigh Bunte, MD   1 puff at 03/10/24 0942   folic acid  (FOLVITE ) tablet 1 mg  1 mg Oral Daily Motley-Mangrum, Jadeka A, PMHNP   1 mg at 03/11/24 1610   gabapentin  (NEURONTIN ) capsule 200 mg  200 mg Oral BID Jaquanda Wickersham, MD   200 mg at 03/11/24 0816   levalbuterol  (XOPENEX ) nebulizer solution 0.63 mg  0.63 mg Inhalation Q8H PRN Aurelia Blotter, MD       loperamide  (IMODIUM ) capsule 2-4 mg  2-4 mg Oral PRN Kingjames Coury, MD       LORazepam  (ATIVAN ) tablet 1 mg  1 mg Oral TID Ludmila Ebarb, MD   1 mg at 03/11/24 9604   magnesium  hydroxide (MILK OF MAGNESIA) suspension 30 mL  30 mL Oral Daily PRN Motley-Mangrum, Jadeka A, PMHNP       multivitamin with minerals tablet 1 tablet  1 tablet Oral Daily Motley-Mangrum, Jadeka A, PMHNP   1 tablet at 03/11/24 0816   naproxen  (NAPROSYN ) tablet 250 mg  250 mg Oral BID WC Tamberly Pomplun, MD   250 mg at 03/11/24 5409   nicotine  polacrilex (NICORETTE ) gum 2 mg  2 mg Oral PRN Jazlin Tapscott, MD   2 mg at 03/11/24 0559   OLANZapine  (ZYPREXA ) injection 5 mg  5 mg Intramuscular TID PRN Motley-Mangrum, Jadeka A, PMHNP       OLANZapine  zydis (ZYPREXA ) disintegrating tablet 5 mg  5 mg Oral TID PRN Motley-Mangrum, Jadeka A, PMHNP       ondansetron  (ZOFRAN -ODT) disintegrating tablet 4 mg  4 mg Oral Q6H PRN  Lakita Sahlin, MD       ondansetron  (ZOFRAN -ODT) disintegrating tablet 4 mg  4 mg Oral Q12H Niccolas Loeper, MD   4 mg at 03/10/24 2145   sodium chloride  tablet 1 g  1 g Oral BID WC Motley-Mangrum, Jadeka A, PMHNP   1 g at 03/11/24 8119   thiamine  (VITAMIN B1) tablet 100 mg  100 mg Oral Daily Motley-Mangrum, Jadeka A, PMHNP   100 mg at 03/11/24 1478   Or   thiamine  (VITAMIN B1) injection 100 mg  100 mg Intravenous Daily Motley-Mangrum, Jadeka A, PMHNP       Facility-Administered Medications Ordered in Other Encounters  Medication Dose Route Frequency Provider Last Rate Last Admin   chlorhexidine  (HIBICLENS ) 4 % liquid 1 application  1 application  Topical Once Caralyn Chandler, MD       chlorhexidine  (HIBICLENS ) 4 % liquid 1 application  1 application  Topical Once Caralyn Chandler, MD        Lab Results:  Results for orders placed or performed during the hospital encounter of 03/06/24 (from the past 48 hours)  Comprehensive metabolic panel     Status: Abnormal   Collection Time: 03/10/24  7:16 PM  Result Value Ref Range   Sodium 134 (L) 135 - 145 mmol/L   Potassium 3.8 3.5 - 5.1 mmol/L   Chloride 101 98 - 111 mmol/L   CO2 23 22 - 32 mmol/L   Glucose, Bld 109 (H) 70 - 99 mg/dL    Comment: Glucose reference range applies only to samples taken after fasting for at least 8 hours.   BUN 12 8 -  23 mg/dL   Creatinine, Ser 4.74 0.44 - 1.00 mg/dL   Calcium  9.7 8.9 - 10.3 mg/dL   Total Protein 7.0 6.5 - 8.1 g/dL   Albumin 4.4 3.5 - 5.0 g/dL   AST 38 15 - 41 U/L   ALT 36 0 - 44 U/L   Alkaline Phosphatase 54 38 - 126 U/L   Total Bilirubin 0.7 0.0 - 1.2 mg/dL   GFR, Estimated >25 >95 mL/min    Comment: (NOTE) Calculated using the CKD-EPI Creatinine Equation (2021)    Anion gap 10 5 - 15    Comment: Performed at [facility], [address] [place], [place] [address]    Blood Alcohol level:  Lab Results  Component Value Date   ETH 282 (H) 03/05/2024   ETH <10 02/04/2024     Metabolic Disorder Labs: Lab Results  Component Value Date   HGBA1C 6.0 (H) 03/10/2021   MPG 125.5 03/10/2021   MPG 114.02 06/27/2018   Lab Results  Component Value Date   PROLACTIN 24.0 (H) 06/27/2018   Lab Results  Component Value Date   CHOL 272 (H) 01/13/2024   TRIG 84 01/13/2024   HDL 132 01/13/2024   CHOLHDL 2.1 01/13/2024   VLDL 23 06/27/2018   LDLCALC 127 (H) 01/13/2024   LDLCALC 121 (H) 01/24/2023    Physical Findings: AIMS:  , ,  ,  ,    CIWA:  CIWA-Ar Total: 1 COWS:      Psychiatric Specialty Exam:  Presentation  General Appearance:  Appropriate for Environment; Casual  Eye Contact: Fair  Speech: Clear and Coherent  Speech Volume: Normal    Mood and Affect  Mood: Anxious  Affect: Appropriate   Thought Process  Thought Processes: Coherent  Descriptions of Associations:Intact  Orientation:Full (Time, Place and Person)  Thought Content:Illogical  Hallucinations: Denies  Ideas of Reference:None  Suicidal Thoughts: Denies  Homicidal Thoughts: Denies   Sensorium  Memory: Immediate Fair; Recent Fair; Remote Poor  Judgment: Impaired  Insight: Shallow   Executive Functions  Concentration: Poor  Attention Span: Poor  Recall: [employer] of Knowledge: Fair  Language: Fair   Psychomotor Activity  Psychomotor Activity: No data recorded  Musculoskeletal: Strength & Muscle Tone: within normal limits Gait & Station: normal Assets  Assets: Manufacturing systems engineer; Financial Resources/Insurance; Social Support; Resilience    Physical Exam: Physical Exam Vitals and nursing note reviewed.    ROS Blood pressure (!) 120/56, pulse 74, temperature 97.6 F (36.4 C), resp. rate 16, height 5\' 3"  (1.6 m), weight 62.8 kg, SpO2 97%. Body mass index is 24.53 kg/m.  Diagnosis: Principal Problem:   MDD (major depressive disorder) Mode arte   Clinical Decision Making: Patient with history of depression,  anxiety, severe alcohol use admitted to inpatient psychiatric unit on IVC initiated by her friend after patient made statements of suicide in the context of being intoxicated.  Patient is requesting detox outpatient substance use resources   Treatment Plan Summary:   Safety and Monitoring:             -- involuntary admission to inpatient psychiatric unit for safety, stabilization and treatment             -- Daily contact with patient to assess and evaluate symptoms and progress in treatment             -- Patient's case to be discussed in multi-disciplinary team meeting             --  Observation Level: q15 minute checks             -- Vital signs:  q12 hours             -- Precautions: suicide, elopement, and assault   2. Psychiatric Diagnoses and Treatment:               Patient is declining antidepressants at this time CIWA protocol and   librium  PRN for detox- discontinued as patient's home ativan  1mg   is scheduled TID now  Gabapentin  is added back to 200mg  BID to help with chronic alcoholism/anxiety/neuropathic pain  Per chart, on admission, Patient is noted to have polypharmacy of Valium , Ativan , gabapentin , Ambien .     -- The risks/benefits/side-effects/alternatives to this medication were discussed in detail with the patient and time was given for questions. The patient consents to medication trial.                -- Metabolic profile and EKG monitoring obtained while on an atypical antipsychotic (BMI: Lipid Panel: HbgA1c: QTc:)              -- Encouraged patient to participate in unit milieu and in scheduled group therapies                            3. Medical Issues Being Addressed:      4. Discharge Planning:              -- Social work and case management to assist with discharge planning and identification of hospital follow-up needs prior to discharge             -- Estimated LOS: 5-7 days             -- Discharge Concerns: Need to establish a safety plan; Medication  compliance and effectiveness             -- Discharge Goals: Return home with outpatient referrals follow ups  Aurelia Blotter, MD 03/11/2024, 12:40 PM

## 2024-03-11 NOTE — Plan of Care (Signed)
  Problem: Education: Goal: Knowledge of [facility] Education information/materials will improve Outcome: Progressing Goal: Emotional status will improve Outcome: Progressing Goal: Mental status will improve Outcome: Progressing Goal: Verbalization of understanding the information provided will improve Outcome: Progressing   Problem: Activity: Goal: Interest or engagement in activities will improve Outcome: Progressing Goal: Sleeping patterns will improve Outcome: Progressing   Problem: Coping: Goal: Ability to verbalize frustrations and anger appropriately will improve Outcome: Progressing Goal: Ability to demonstrate self-control will improve Outcome: Progressing   Problem: Health Behavior/Discharge Planning: Goal: Identification of resources available to assist in meeting health care needs will improve Outcome: Progressing Goal: Compliance with treatment plan for underlying cause of condition will improve Outcome: Progressing   Problem: Physical Regulation: Goal: Ability to maintain clinical measurements within normal limits will improve Outcome: Progressing   Problem: Safety: Goal: Periods of time without injury will increase Outcome: Progressing   Problem: Education: Goal: Utilization of techniques to improve thought processes will improve Outcome: Progressing Goal: Knowledge of the prescribed therapeutic regimen will improve Outcome: Progressing   

## 2024-03-11 NOTE — Progress Notes (Signed)
   03/11/24 0720  Psych Admission Type (Psych Patients Only)  Admission Status Involuntary  Psychosocial Assessment  Patient Complaints Anxiety  Eye Contact Fair  Facial Expression Animated  Affect Anxious  Speech Logical/coherent  Interaction Assertive  Motor Activity Slow  Appearance/Hygiene In scrubs  Thought Process  Coherency WDL  Content WDL  Delusions None reported or observed  Perception WDL  Hallucination None reported or observed  Judgment WDL  Confusion WDL  Danger to Self  Current suicidal ideation? Denies  Danger to Others  Danger to Others None reported or observed

## 2024-03-11 NOTE — Group Note (Signed)
 Date:  03/11/2024 Time:  10:13 PM  Group Topic/Focus:  Self Esteem Action Plan:   The focus of this group is to help patients create a plan to continue to build self-esteem after discharge.    Participation Level:  Active  Participation Quality:  Appropriate  Affect:  Appropriate  Cognitive:  Appropriate  Insight: Appropriate  Engagement in Group:  Engaged  Modes of Intervention:  Discussion  Additional Comments:    Sherlie Distance 03/11/2024, 10:13 PM

## 2024-03-12 NOTE — Group Note (Signed)
 Date:  03/12/2024 Time:  10:40 AM  Group Topic/Focus:  Movement Therapy    Participation Level:  Active  Participation Quality:  Appropriate  Affect:  Appropriate  Cognitive:  Appropriate  Insight: Appropriate  Engagement in Group:  Engaged  Modes of Intervention:  Activity  Additional Comments:  none  Merton Abts 03/12/2024, 10:40 AM

## 2024-03-12 NOTE — Progress Notes (Signed)
   03/12/24 1315  Psych Admission Type (Psych Patients Only)  Admission Status Involuntary  Psychosocial Assessment  Patient Complaints Anxiety  Eye Contact Brief  Facial Expression Animated  Affect Anxious  Speech Logical/coherent  Interaction Assertive  Motor Activity Slow  Appearance/Hygiene In scrubs  Behavior Characteristics Anxious  Mood Anxious  Thought Process  Coherency WDL  Content WDL  Delusions None reported or observed  Perception WDL  Hallucination None reported or observed  Judgment WDL  Confusion WDL  Danger to Self  Current suicidal ideation? Denies  Danger to Others  Danger to Others None reported or observed   Patient discharged in care of family. Discharged via private transportation. Denies HI/SI/AVH. All discharge instructions read and given to patient with acknowledgment.

## 2024-03-12 NOTE — Care Management Important Message (Signed)
 Important Message  Patient Details  Name: Sarah Reeves MRN: [id number] Date of Birth: [birth year]   Important Message Given:  Yes - Medicare IM     Claudio Culver, LCSWA 03/12/2024, 9:45 AM

## 2024-03-12 NOTE — Discharge Summary (Signed)
 Patient: Sarah Reeves. Sarah Reeves: [id number] Date of Evaluation: Mar 07, 2024 Date of Discharge: Mar 12, 2024 Chief Complaint: Major Depressive Disorder (MDD) [F32.9]  History of Present Illness: Sarah Reeves is a 63 year old female who was admitted to the inpatient psychiatric unit at [facility] on an involuntary commitment (IVC) on Mar 07, 2024, following statements of self-harm while intoxicated. Upon admission, she reported grieving the loss of her husband 3.5 years prior and acknowledged increased alcohol use, escalating to heavy daily drinking. She recounted an incident where she allegedly threatened to "run into traffic" if she didn't get into detox. She initially denied memory of these statements but accepted her friend's account.  During her hospitalization, Sarah Reeves's anxiety significantly improved with medication regulation. She engaged with the treatment team and expressed readiness for discharge. She denies any current suicidal or homicidal ideation, intent, or plan, as well as any hallucinations. She reports having good sleep and discusses plans for discharge today, Friday, Mar 12, 2024, as her brother is arriving from [place]  to pick her up. She intends to address financial matters at home and potentially stay with her brother for a few days. Her sodium level is normalized now Past Psychiatric History:  Previous Diagnoses/Symptoms: Anxiety Current Psychiatric Provider: None reported Home Medications (current): None currently Previous Medication Trials: SSRIs (caused teeth grinding), Cymbalta  (weight gain) Therapy: None reported Prior Psychiatric Hospitalization: Many years ago at [facility] Prior Self-Harm: At age 82 Prior Violence: None reported Family Psychiatric History: Anxiety and alcohol use (biological father) Family History of Suicide: None reported Social History:  Developmental History: Normal Educational History: 11th grade Occupational  History: Unemployed, receiving husband's SSI Legal History: None reported Living Situation: Lives alone, has a stepson (will be staying with brother for a few days post-discharge) Spiritual History: Denies Access to [employer]: Denies Substance History: Alcohol: Heavy drinking for the last few months, 5-6 drinks (vodka) per day. Denies history of alcohol withdrawal seizures or DT's. Tobacco: Half pack per day for 47 years. Illicit Drugs: Denies Prescription Drug Abuse: Denies Past Medical History:  Allergy , Angio-edema, Anxiety, Arthritis, Asthma, Clostridioides difficile infection, Complication of anesthesia (requires a lot to be sedated), COPD, Depression, Emphysema of lung, Fibromyalgia, GERD, Hepatitis C (treated), Herpes simplex, Hyperlipemia, Hypertension, Insomnia, Mental disorder, Neuropathy, Osteoporosis, Oxygen  deficiency, PONV, Sleep apnea, Substance abuse, Urticaria. Past Surgical History:  Augmentation Mammaplasty (Bilateral), Breast Biopsy (Left), Breast Enhancement Surgery, Breast Lumpectomy with Radioactive Seed Localization (Left), Breast Reduction Surgery, Colonoscopy, CT CTA Coronary W/CA Score W/CM &/OR WO/CM, Exploratory Laparotomy, NM Myoview  LTD, Temporomandibular Joint Surgery, Transthoracic Echocardiogram, Tubal Ligation, Upper GI Endoscopy. Discharge Medications:  Current Facility-Administered Medications  Medication Dose Route Frequency Provider Last Rate Last Admin   acetaminophen  (TYLENOL ) tablet 650 mg  650 mg Oral Q6H PRN Motley-Mangrum, Jadeka A, PMHNP       alum & mag hydroxide-simeth (MAALOX/MYLANTA) 200-200-20 MG/5ML suspension 30 mL  30 mL Oral Q4H PRN Motley-Mangrum, Jadeka A, PMHNP       bisacodyl  (DULCOLAX) EC tablet 5 mg  5 mg Oral Daily PRN Jadapalle, Sree, MD   5 mg at 03/08/24 1406   fluticasone  furoate-vilanterol (BREO ELLIPTA ) 200-25 MCG/ACT 1 puff  1 puff Inhalation Daily Jadapalle, Sree, MD   1 puff at 03/10/24 0942   folic acid   (FOLVITE ) tablet 1 mg  1 mg Oral Daily Motley-Mangrum, Jadeka A, PMHNP   1 mg at 03/11/24 0816   gabapentin  (NEURONTIN ) capsule 200 mg  200 mg Oral BID Jadapalle, Sree, MD  200 mg at 03/11/24 2117   levalbuterol  (XOPENEX ) nebulizer solution 0.63 mg  0.63 mg Inhalation Q8H PRN Aurelia Blotter, MD       LORazepam  (ATIVAN ) tablet 1 mg  1 mg Oral TID Jadapalle, Sree, MD   1 mg at 03/11/24 2116   magnesium  hydroxide (MILK OF MAGNESIA) suspension 30 mL  30 mL Oral Daily PRN Motley-Mangrum, Jadeka A, PMHNP       multivitamin with minerals tablet 1 tablet  1 tablet Oral Daily Motley-Mangrum, Jadeka A, PMHNP   1 tablet at 03/11/24 0816   naproxen  (NAPROSYN ) tablet 250 mg  250 mg Oral BID WC Jadapalle, Sree, MD   250 mg at 03/11/24 1719   nicotine  polacrilex (NICORETTE ) gum 2 mg  2 mg Oral PRN Jadapalle, Sree, MD   2 mg at 03/11/24 1719   OLANZapine  (ZYPREXA ) injection 5 mg  5 mg Intramuscular TID PRN Motley-Mangrum, Jadeka A, PMHNP       OLANZapine  zydis (ZYPREXA ) disintegrating tablet 5 mg  5 mg Oral TID PRN Motley-Mangrum, Jadeka A, PMHNP       ondansetron  (ZOFRAN -ODT) disintegrating tablet 4 mg  4 mg Oral Q12H Jadapalle, Sree, MD   4 mg at 03/11/24 2200   sodium chloride  tablet 1 g  1 g Oral BID WC Motley-Mangrum, Jadeka A, PMHNP   1 g at 03/11/24 1719   thiamine  (VITAMIN B1) tablet 100 mg  100 mg Oral Daily Motley-Mangrum, Jadeka A, PMHNP   100 mg at 03/11/24 4098   Or   thiamine  (VITAMIN B1) injection 100 mg  100 mg Intravenous Daily Motley-Mangrum, Jadeka A, PMHNP       Facility-Administered Medications Ordered in Other Encounters  Medication Dose Route Frequency Provider Last Rate Last Admin   chlorhexidine  (HIBICLENS ) 4 % liquid 1 application  1 application  Topical Once Toth, Paul III, MD       chlorhexidine  (HIBICLENS ) 4 % liquid 1 application  1 application  Topical Once Toth, Paul III, MD         Lab Results (Most Recent):  Comprehensive Metabolic Panel (03/10/24): Sodium 134 (L), Glucose  109 (H) (non-fasting). All other values within normal limits or improved. Blood Alcohol Level: ETH 282 (H) on 03/05/2024 (on admission). Physical & Psychiatric Exam on Discharge:  Vitals: Stable. CIWA-Ar Total: 1. General Appearance: Appropriate. Eye Contact: Fair. Speech: Clear and coherent, normal volume. Mood: Anxious, but improved. Affect: Appropriate. Thought Processes: Coherent, intact associations, no illogical thought content. Orientation: Full (Time, Place, Person). Hallucinations: Denies. Ideas of Reference: None. Suicidal/Homicidal Thoughts: Denies. Memory: Fair. Judgment: Impaired on admission, but showing improvement. Insight: Shallow on admission, but showing improvement with understanding of the need for outpatient care. Executive Functions: Concentration, [employer], Recall, Fund of Knowledge, Language all fair. Assets: Manufacturing systems engineer, Health and safety inspector, Social Support, Resilience. Diagnosis:  Principal Problem: Major Depressive Disorder (MDD) Clinical Course and Discharge Plan: Ms. Mondor was admitted for acute psychiatric stabilization due to suicidal ideation in the context of severe alcohol use. Her condition has stabilized significantly during hospitalization. She has engaged in treatment, her anxiety has improved, and she verbalizes no current suicidal or homicidal intent.  Her sodium level is normalized now she had challenges with polydipsia  Discharge Planning:  Disposition: Discharged home with her brother. Follow-up: Patient educated on the importance of adhering to her discharge medication regimen, including Ativan , Gabapentin , and Fluoxetine. Strong recommendations were made for prompt follow-up with an outpatient psychiatrist to adjust medications as needed and to establish ongoing therapy for  grief, depression, anxiety, and substance use. Resources for outpatient substance use treatment were provided as requested by the  patient. Safety Plan: Discussed and reviewed with the patient. Medication Compliance: Emphasized the importance of medication compliance and potential side effects. Discharge Goals Met: Patient is returning home with outpatient referrals for continued care.

## 2024-03-12 NOTE — Plan of Care (Signed)
 Progressing and Ciwa's improved.

## 2024-03-12 NOTE — Group Note (Deleted)
 Recreation Therapy Group Note   Group Topic:Leisure Education  Group Date: 03/12/2024 Start Time: 1040 End Time: 1140 Facilitators: Deatrice Factor, LRT Location: Craft Room  Group Description: Leisure. Patients were given the option to choose from singing karaoke, coloring mandalas, using oil pastels, journaling, or playing with play-doh. LRT and pts discussed the meaning of leisure, the importance of participating in leisure during their free time/when they're outside of the hospital, as well as how our leisure interests can also serve as coping skills.   Goal Area(s) Addressed:  Patient will identify a current leisure interest.  Patient will learn the definition of "leisure". Patient will practice making a positive decision. Patient will have the opportunity to try a new leisure activity. Patient will communicate with peers and LRT.        Affect/Mood: {RT BHH Affect/Mood:26271}   Participation Level: {RT BHH Participation Level:26267}   Participation Quality: {RT BHH Participation Quality:26268}   Behavior: {RT BHH Group Behavior:26269}   Speech/Thought Process: {RT BHH Speech/Thought:26276}   Insight: {RT BHH Insight:26272}   Judgement: {RT BHH Judgement:26278}   Modes of Intervention: {RT BHH Modes of Intervention:26277}   Patient Response to Interventions:  {RT BHH Patient Response to Intervention:26274}   Education Outcome:  {RT BHH Education Outcome:26279}   Clinical Observations/Individualized Feedback: *** was *** in their participation of session activities and group discussion. Pt identified ***   Plan: {RT BHH Tx ZOXW:96045}   Deatrice Factor, LRT,  03/12/2024 1:05 PM

## 2024-03-12 NOTE — BHH Counselor (Signed)
 Csw gave blank IM note due to IM not being scanned in to medical records at this time.   Derrill Flirt, MSW, [place] 03/12/2024 9:45 AM

## 2024-03-12 NOTE — BHH Suicide Risk Assessment (Signed)
 [facility] Admission Suicide Risk Assessment   Nursing information obtained from:  Patient Demographic factors:  Divorced or widowed, Living alone Current Mental Status:  NA Loss Factors:  NA Historical Factors:  NA Risk Reduction Factors:  NA  Total Time spent with patient: 15 minutes Principal Problem: MDD (major depressive disorder) Diagnosis:  Principal Problem:   MDD (major depressive disorder)  Subjective Data: Patient is a 63 year old female who was admitted due to depression and alcohol use has responded well to treatment and is denying any current suicidal and homicidal thoughts can be discharged to outpatient follow-up  Continued Clinical Symptoms:  Alcohol Use Disorder Identification Test Final Score (AUDIT): 4 The "Alcohol Use Disorders Identification Test", Guidelines for Use in Primary Care, Second Edition.  World Science writer [facility]). Score between 0-7:  no or low risk or alcohol related problems. Score between 8-15:  moderate risk of alcohol related problems. Score between 16-19:  high risk of alcohol related problems. Score 20 or above:  warrants further diagnostic evaluation for alcohol dependence and treatment.   CLINICAL FACTORS:   Depression:   Recent sense of peace/wellbeing   Musculoskeletal: Strength & Muscle Tone: within normal limits Gait & Station: normal Patient leans: N/A  Psychiatric Specialty Exam:  Presentation  General Appearance:  Appropriate for Environment; Casual  Eye Contact: Fair  Speech: Clear and Coherent  Speech Volume: Normal  Handedness: Right   Mood and Affect  Mood: Anxious  Affect: Appropriate   Thought Process  Thought Processes: Coherent  Descriptions of Associations:Intact  Orientation:Full (Time, Place and Person)  Thought Content:Illogical  History of Schizophrenia/Schizoaffective disorder:No  Duration of Psychotic Symptoms:No data recorded Hallucinations:No data recorded Ideas of  Reference:None  Suicidal Thoughts:No data recorded Homicidal Thoughts:No data recorded  Sensorium  Memory: Immediate Fair; Recent Fair; Remote Poor  Judgment: Impaired  Insight: Shallow   Executive Functions  Concentration: Poor  Attention Span: Poor  Recall: [employer] of Knowledge: Fair  Language: Fair   Psychomotor Activity  Psychomotor Activity:No data recorded  Assets  Assets: Communication Skills; Financial Resources/Insurance; Social Support; Resilience   Sleep  Sleep:No data recorded   Physical Exam: Physical Exam ROS Blood pressure 137/69, pulse (!) 54, temperature 97.9 F (36.6 C), resp. rate 16, height 5\' 3"  (1.6 m), weight 62.8 kg, SpO2 100%. Body mass index is 24.53 kg/m.   COGNITIVE FEATURES THAT CONTRIBUTE TO RISK:  None    SUICIDE RISK:   Minimal: No identifiable suicidal ideation.  Patients presenting with no risk factors but with morbid ruminations; may be classified as minimal risk based on the severity of the depressive symptoms  PLAN OF CARE: Patient will follow-up with outpatient services  I certify that inpatient services furnished can reasonably be expected to improve the patient's condition.   Lindajo Resides, MD 03/12/2024, 9:07 AM

## 2024-03-12 NOTE — Progress Notes (Signed)
  [facility] Adult Case Management Discharge Plan :  Will you be returning to the same living situation after discharge:  Yes,  pt will return home  At discharge, do you have transportation home?: Yes,  pt's brother will pick her up  Do you have the ability to pay for your medications: Yes,  BLUE CROSS BLUE SHIELD / BCBS COMM PPO  Release of information consent forms completed and in the chart;  Patient's signature needed at discharge.  Patient to Follow up at:  Follow-up Information     [facility] Go on 04/13/2024.   Why: Your appointment is scheduled for 1:30 PM for an in-person visit. You must fill out your new pt forms prior to your appoint so please arrive at least 15 minutes prior. Please also remember to bring your insurance card. Contact information: [address] [place] [address] [phone]                 Next level of care provider has access to [facility] Link:no  Safety Planning and Suicide Prevention discussed: Yes,  BLUE CROSS BLUE SHIELD / BCBS COMM PPO     Has patient been referred to the Quitline?: Patient refused referral for treatment  Patient has been referred for addiction treatment: No known substance use disorder.  [address], LCSWA 03/12/2024, 9:41 AM

## 2024-03-12 NOTE — Progress Notes (Signed)
 Pt visible with peers and interactive in the milieu.

## 2024-03-17 NOTE — Progress Notes (Deleted)
 Cardiology Clinic Note   Patient Name: Sarah Reeves Date of Encounter: 03/17/2024  Primary Care Provider:  Yvonnie Heritage, NP Primary Cardiologist:  Randene Bustard, MD  Patient Profile    Sarah Reeves 63 year old female presents to the clinic today for follow-up evaluation of her coronary artery disease and hypertension.  Past Medical History    Past Medical History:  Diagnosis Date   Allergy     Angio-edema    Anxiety    Arthritis    Asthma    Clostridioides difficile infection    Complication of anesthesia    takes alot to sedated   COPD (chronic obstructive pulmonary disease) (HCC)    Depression    Emphysema of lung (HCC)    Fibromyalgia    GERD (gastroesophageal reflux disease)    Hepatitis C    (Dr. Andriette Keeling) Treated with Harvoni March-May 2016   Herpes simplex    Hyperlipemia    Hypertension    Insomnia    Mental disorder    Neuropathy    Osteoporosis    Oxygen  deficiency    PONV (postoperative nausea and vomiting)    Sleep apnea    Substance abuse (HCC)    Urticaria    Past Surgical History:  Procedure Laterality Date   AUGMENTATION MAMMAPLASTY Bilateral    BREAST BIOPSY Left 11/2014   BREAST BIOPSY Left 12/2014   BREAST ENHANCEMENT SURGERY     BREAST LUMPECTOMY WITH RADIOACTIVE SEED LOCALIZATION Left 12/26/2014   Procedure: LEFT BREAST LUMPECTOMY WITH RADIOACTIVE SEED LOCALIZATION;  Surgeon: Lillette Reid III, MD;  Location: [facility];  Service: General;  Laterality: Left;   BREAST REDUCTION SURGERY     COLONOSCOPY     CT CTA CORONARY W/CA SCORE W/CM &/OR WO/CM  01/19/2023   CAC score 0.  Noncalcified ostial large D1 plaque (50 to 69%-CT FFR negative/nonobstructive).  Otherwise minimal noncalcified plaque in the proximal LAD and proximal RCA.   EXPLORATORY LAPAROTOMY     NM MYOVIEW  LTD  07/07/2015   Normal Myocardial Perfusion Scan. Low risk lexiscan  nuclear study with minimal insignificant breast attenuation and normal myocardial  perfusion and function; EF 53% without wall motion abnormalities and normal systolic thickening   TEMPOROMANDIBULAR JOINT SURGERY     TRANSTHORACIC ECHOCARDIOGRAM  03/11/2023   Normal LV size and function.  EF 60 to 65% with no RWMA.  GR 1 DD.  Normal RV size and function.  Normal RVP and RAP.  Moderate MAC but no MS or MR.   Normal AoV.   TUBAL LIGATION     UPPER GI ENDOSCOPY     x5    Allergies  Allergies  Allergen Reactions   Keflex  [Cephalexin ] Hives, Itching and Other (See Comments)    Tongue and throat itching/tingling   Shellfish Allergy  Other (See Comments)    Unknown reaction; "MD told me I was allergic"   Vancomycin  Anaphylaxis    Face and tongue swells   Zoloft  [Sertraline  Hcl] Other (See Comments)    REACTION: Nightmares, Grind teeth really bad   Latex Itching    Itch and blisters from contact   Amitriptyline     Hot, itching, tongue tingling, breakthrough bleeding.   Clindamycin/Lincomycin Hives and Other (See Comments)    Stated that this causes her to get really horse and made her feel really hot   Other Other (See Comments)   Penicillins    Pentosan Polysulfate Sodium Swelling    Throat swelling   Shellfish-Derived Products Other (  See Comments)   Sulfa Antibiotics Other (See Comments)   Topamax [Topiramate]     Says it makes her "constipation and she can't urinate normally"   Uribel [Meth-Hyo-M Bl-Na Phos-Ph Sal] Nausea Only   Uribel [Urelle] Swelling and Other (See Comments)    Patient stated this makes her stomach swell and makes her feel si   Lamotrigine Rash   Sulfamethoxazole-Trimethoprim Rash    Patient developed feelings of dizziness, rash and itchy hands.  Patient then vomited and had diarrhea.    History of Present Illness    Sarah Reeves has a PMH of COPD, hypertension, depression, emphysema, fibromyalgia, GERD, sleep apnea, and substance abuse.  She presented to the emergency department on 02/04/2024 and was discharged on 02/06/2024.  She  presented via EMS from home.  She reported blood pressures in the 170s-200 systolic.  She had taken 3 nitroglycerin  and 325 of aspirin .  She noted mild chest and upper epigastric pain.  She reported that the pain had resolved and then come back.  She did note that she had vodka that day and she has a history of EtOH dependence.  At the time of exam she had no pain.  She denied urinary symptoms.  She had no lower extremity swelling and no recent travel/surgeries or hospitalizations.  She presents to the clinic today for follow-up evaluation and states***.  *** denies chest pain, shortness of breath, lower extremity edema, fatigue, palpitations, melena, hematuria, hemoptysis, diaphoresis, weakness, presyncope, syncope, orthopnea, and PND.  Essential hypertension-BP today***. Maintain blood pressure log Heart healthy low-sodium diet Continue olmesartan   Hyperlipidemia-LDL***. High-fiber diet Continue aspirin , rosuvastatin   OSA-reports compliance with CPAP.  Waking up well rested. Continue CPAP use  Disposition: Follow-up with Dr. Addie Holstein or me in 3-4 months.  Home Medications    Prior to Admission medications   Medication Sig Start Date End Date Taking? Authorizing Provider  Ascorbic Acid  (VITAMIN C ) 1000 MG tablet Take 1,000 mg by mouth daily. Patient not taking: Reported on 03/06/2024    [provider]  aspirin  EC 81 MG tablet Take 1 tablet (81 mg total) by mouth daily. Patient not taking: Reported on 03/06/2024 08/04/18   Arleen Lacer, MD  budesonide -formoterol  (SYMBICORT ) 160-4.5 MCG/ACT inhaler Inhale 2 puffs into the lungs 2 (two) times daily. 02/12/24   Parrett, Macdonald Savoy, NP  cetirizine  (ZYRTEC ) 10 MG tablet Take 1 tablet (10 mg total) by mouth daily. Patient taking differently: Take 10 mg by mouth daily as needed for allergies. 09/09/22   Ardie Kras, FNP  cilostazol  (PLETAL ) 50 MG tablet Take 1 tablet (50 mg total) by mouth 2 (two) times daily. Patient not taking:  Reported on 03/06/2024 10/07/23   Avanell Leigh, MD  cyclobenzaprine  (FLEXERIL ) 10 MG tablet Take 10 mg by mouth 3 (three) times daily as needed for muscle spasms.    [provider]  DULoxetine  (CYMBALTA ) 30 MG capsule Take 30 mg by mouth daily. Patient not taking: Reported on 03/06/2024 07/31/18   [provider]  EPINEPHrine  (AUVI-Q ) 0.1 MG/0.1ML SOAJ Inject into the muscle. 08/18/18   [provider]  gabapentin  (NEURONTIN ) 300 MG capsule Take 300 mg by mouth daily as needed (nerve pain). 11/29/19   [provider]  levalbuterol  (XOPENEX  HFA) 45 MCG/ACT inhaler INHALE 2 PUFFS INTO THE LUNGS EVERY 4 HOURS AS NEEDED FOR WHEEZING 02/07/23   Parrett, Macdonald Savoy, NP  levalbuterol  (XOPENEX ) 1.25 MG/3ML nebulizer solution Take 1.25 mg by nebulization every 4 (four) hours as  needed for wheezing. 04/25/23   Margaretann Sharper, MD  LORazepam  (ATIVAN ) 2 MG tablet Take 2 mg by mouth in the morning, at noon, and at bedtime. 12/17/19   [provider]  methocarbamol (ROBAXIN) 750 MG tablet Take 750 mg by mouth 3 (three) times daily. 12/09/23   [provider]  Na Sulfate-K Sulfate-Mg Sulf 17.5-3.13-1.6 GM/177ML SOLN Take 1 kit by mouth once. 05/02/23   [provider]  nitroGLYCERIN  (NITROSTAT ) 0.4 MG SL tablet Place 1 tablet (0.4 mg total) under the tongue every 5 (five) minutes as needed for chest pain. 10/07/23 03/06/24  Arleen Lacer, MD  olmesartan  (BENICAR ) 40 MG tablet Take 1 tablet (40 mg total) by mouth daily. 02/26/24   Jude Norton, NP  promethazine  (PHENERGAN ) 25 MG tablet Take 25 mg by mouth every 4 (four) hours as needed. 01/21/24   [provider]  rosuvastatin  (CRESTOR ) 10 MG tablet Take 1 tablet (10 mg total) by mouth daily. Patient not taking: Reported on 03/06/2024 07/23/23 10/21/23  Arleen Lacer, MD  valACYclovir  (VALTREX ) 500 MG tablet Take 500 mg by mouth daily as needed (outbreaks). 11/16/19   [provider]   Vitamin D , Cholecalciferol , 25 MCG (1000 UT) TABS Take 25 mcg by mouth daily. Patient not taking: Reported on 03/06/2024 11/27/22   Tania Familia, NP  zolpidem  (AMBIEN ) 10 MG tablet Take 10 mg by mouth at bedtime as needed for sleep. 03/20/21   [provider]    Family History    Family History  Problem Relation Age of Onset   Diabetes Mother    Lung disease Mother    Heart disease Mother    Breast cancer Mother    Heart disease Father    Colon cancer Father    Hypothyroidism Brother    Alzheimer's disease Maternal Grandmother    Alzheimer's disease Maternal Aunt    Skin cancer Maternal Aunt    Stomach cancer Neg Hx    Esophageal cancer Neg Hx    She indicated that her mother is deceased. She indicated that her father is deceased. She indicated that her sister is alive. She indicated that her brother is alive. She indicated that the status of her maternal grandmother is unknown. She indicated that the status of her maternal aunt is unknown. She indicated that the status of her neg hx is unknown.  Social History    Social History   Socioeconomic History   Marital status: Married    Spouse name: Not on file   Number of children: 0   Years of education: 12   Highest education level: Not on file  Occupational History   Occupation: Retired   Occupation: retired  Tobacco Use   Smoking status: Every Day    Current packs/day: 1.00    Average packs/day: 1 pack/day for 47.0 years (47.0 ttl pk-yrs)    Types: Cigarettes    Passive exposure: Current   Smokeless tobacco: Never   Tobacco comments:    Had a bad break up with boyfriend, she has been smoking and drinking.  10 cigarettes/day.  02/10/2024 hfb  Vaping Use   Vaping status: Never Used  Substance and Sexual Activity   Alcohol use: Yes    Alcohol/week: 49.0 standard drinks of alcohol    Types: 49 Shots of liquor per week    Comment: Drinking 5-6 drinks per day (grey goose).  02/10/2024 hfb   Drug use: No    Sexual activity: Yes  Birth control/protection: Surgical  Other Topics Concern   Not on file  Social History Narrative   Patient is right handed.   Four cups caffeine per day.   Lives at home with husband.   Social Drivers of Corporate investment banker Strain: Not on file  Food Insecurity: No Food Insecurity (03/06/2024)   Hunger Vital Sign    Worried About Running Out of Food in the Last Year: Never true    Ran Out of Food in the Last Year: Never true  Transportation Needs: No Transportation Needs (03/06/2024)   PRAPARE - Administrator, Civil Service (Medical): No    Lack of Transportation (Non-Medical): No  Physical Activity: Not on file  Stress: Not on file  Social Connections: Unknown (02/25/2023)   [facility] from [facility], [facility]   Social Network    Social Network: Not on file  Intimate Partner Violence: Not At Risk (03/06/2024)   Humiliation, Afraid, Rape, and Kick questionnaire    Fear of Current or Ex-Partner: No    Emotionally Abused: No    Physically Abused: No    Sexually Abused: No     Review of Systems    General:  No chills, fever, night sweats or weight changes.  Cardiovascular:  No chest pain, dyspnea on exertion, edema, orthopnea, palpitations, paroxysmal nocturnal dyspnea. Dermatological: No rash, lesions/masses Respiratory: No cough, dyspnea Urologic: No hematuria, dysuria Abdominal:   No nausea, vomiting, diarrhea, bright red blood per rectum, melena, or hematemesis Neurologic:  No visual changes, wkns, changes in mental status. All other systems reviewed and are otherwise negative except as noted above.  Physical Exam    VS:  There were no vitals taken for this visit. , BMI There is no height or weight on file to calculate BMI. GEN: Well nourished, well developed, in no acute distress. HEENT: normal. Neck: Supple, no JVD, carotid bruits, or masses. Cardiac: RRR, no murmurs, rubs, or gallops. No clubbing, cyanosis, edema.   Radials/DP/PT 2+ and equal bilaterally.  Respiratory:  Respirations regular and unlabored, clear to auscultation bilaterally. GI: Soft, nontender, nondistended, BS + x 4. MS: no deformity or atrophy. Skin: warm and dry, no rash. Neuro:  Strength and sensation are intact. Psych: Normal affect.  Accessory Clinical Findings    Recent Labs: 03/05/2024: Hemoglobin 13.9; Platelets 204 03/10/2024: ALT 36; BUN 12; Creatinine, Ser 0.74; Potassium 3.8; Sodium 134   Recent Lipid Panel    Component Value Date/Time   CHOL 272 (H) 01/13/2024 0812   TRIG 84 01/13/2024 0812   HDL 132 01/13/2024 0812   CHOLHDL 2.1 01/13/2024 0812   CHOLHDL 3.3 06/27/2018 0623   VLDL 23 06/27/2018 0623   LDLCALC 127 (H) 01/13/2024 0812    No BP recorded.  {Refresh Note OR Click here to enter BP  :1}***    ECG personally reviewed by me today- ***     Echocardiogram 03/11/2023   IMPRESSIONS     1. Left ventricular ejection fraction, by estimation, is 60 to 65%. The  left ventricle has normal function. The left ventricle has no regional  wall motion abnormalities. Left ventricular diastolic parameters are  consistent with Grade I diastolic  dysfunction (impaired relaxation). The average left ventricular global  longitudinal strain is -16.5 %. The global longitudinal strain is normal.   2. Right ventricular systolic function is normal. The right ventricular  size is normal.   3. The mitral valve is normal in structure. No evidence of mitral valve  regurgitation. No evidence of mitral stenosis. Moderate mitral annular  calcification.   4. The aortic valve is tricuspid. Aortic valve regurgitation is not  visualized. No aortic stenosis is present.   5. The inferior vena cava is normal in size with greater than 50%  respiratory variability, suggesting right atrial pressure of 3 mmHg.   FINDINGS   Left Ventricle: Left ventricular ejection fraction, by estimation, is 60  to 65%. The left ventricle has normal  function. The left ventricle has no  regional wall motion abnormalities. The average left ventricular global  longitudinal strain is -16.5 %.  The global longitudinal strain is normal. The left ventricular internal  cavity size was normal in size. There is no left ventricular hypertrophy.  Left ventricular diastolic parameters are consistent with Grade I  diastolic dysfunction (impaired  relaxation).   Right Ventricle: The right ventricular size is normal. No increase in  right ventricular wall thickness. Right ventricular systolic function is  normal.   Left Atrium: Left atrial size was normal in size.   Right Atrium: Right atrial size was normal in size.   Pericardium: There is no evidence of pericardial effusion.   Mitral Valve: The mitral valve is normal in structure. Moderate mitral  annular calcification. No evidence of mitral valve regurgitation. No  evidence of mitral valve stenosis.   Tricuspid Valve: The tricuspid valve is normal in structure. Tricuspid  valve regurgitation is not demonstrated. No evidence of tricuspid  stenosis.   Aortic Valve: The aortic valve is tricuspid. Aortic valve regurgitation is  not visualized. No aortic stenosis is present.   Pulmonic Valve: The pulmonic valve was normal in structure. Pulmonic valve  regurgitation is not visualized. No evidence of pulmonic stenosis.   Aorta: The aortic root is normal in size and structure.   Venous: The inferior vena cava is normal in size with greater than 50%  respiratory variability, suggesting right atrial pressure of 3 mmHg.   IAS/Shunts: No atrial level shunt detected by color flow Doppler.      Assessment & Plan   1.  ***   Chet Cota. Iline Buchinger NP-C     03/17/2024, 1:19 PM [facility] [address] Office [phone] Fax [phone]    I spent***minutes examining this patient, reviewing medications, and using patient centered shared decision  making involving their cardiac care.   I spent  20 minutes reviewing past medical history,  medications, and prior cardiac tests.

## 2024-03-18 ENCOUNTER — Encounter (HOSPITAL_BASED_OUTPATIENT_CLINIC_OR_DEPARTMENT_OTHER): Admitting: Pulmonary Disease

## 2024-03-19 ENCOUNTER — Ambulatory Visit: Attending: General Practice | Admitting: General Practice

## 2024-03-22 DIAGNOSIS — G4736 Sleep related hypoventilation in conditions classified elsewhere: Secondary | ICD-10-CM | POA: Diagnosis not present

## 2024-03-22 DIAGNOSIS — J449 Chronic obstructive pulmonary disease, unspecified: Secondary | ICD-10-CM | POA: Diagnosis not present

## 2024-03-25 ENCOUNTER — Ambulatory Visit (HOSPITAL_BASED_OUTPATIENT_CLINIC_OR_DEPARTMENT_OTHER): Admitting: Pulmonary Disease

## 2024-03-25 ENCOUNTER — Telehealth: Payer: Self-pay

## 2024-03-25 NOTE — Telephone Encounter (Signed)
 Ret Ashlyn's call. Pls try again.

## 2024-03-25 NOTE — Telephone Encounter (Signed)
 Copied from CRM [id number]. Topic: Clinical - Prescription Issue >> Mar 25, 2024  8:36 AM Evie Hoff wrote: Reason for CRM: patient say she was at a place for depression . Was in [place] for 5 days and when I was discharged they took me off all my heart medicine and they put me on a powder .breoellitta and they took me off my other stuff and they had no right to do that . Put me on a one day puffer its a powder and I really dont appreciate them messing with my pulmonary medicine .   Per lov, Continue on Breztri  2 puffs Twice daily  , rinse after use.  Xopenex   inhaler As needed   Continue Oxygen  2l/m At bedtime  ATC X1. LMTCB. The only change I see in the pulmonary meds is the Breztri  to Breo Ellipta  now. Pt is still on Xopenex .

## 2024-03-25 NOTE — Telephone Encounter (Signed)
 I am fine with BREO or Symbicort  they are equal to each other. I can not see the notes from hospital stay regarding the rx change. Breo should be fine if she does not like it I can change her back to Symbicort .

## 2024-03-25 NOTE — Telephone Encounter (Signed)
 I called and spoke to pt. Pt states she was admitted in the hospital due to phychiatric evaluation and she was originally put on Symbicort  by Roena Clark, NP and was told to continue her Xopenex  PRN and her 2L O2 HS. Pt claims that when she was admitted in the hospital for phychiatric evaluation, they took her off all her pulmonary and cardiology medications. Pt is still on Xopenex . The only change is the switch from Symbicort  to Breo Ellipta . Pt was upset that her meds have been switched around and she is confused on what medications she should be taking. Pt stated she was also on the Albuterol  Tartrate inhaler that she also no longer has. She made a comment that if the Jodell Munda was so great that they took her off Symbicort , why did our office not put her on Breo to begin with. Pt states the powdered inhalers make her gag and nauseated.   Pt would like for Roena Clark, NP to advise on what medications the pt should be taking. Pt would like her meds how only Tammy wanted it and " not the hospital" due to the Phychiatric ward not specializing in pulmonary issues. Please advise on what inhalers, nebs, and or rescue inhalers you would like pt to have and if she can also go back on her Albuterol  Tartrate inhaler.

## 2024-03-26 NOTE — Telephone Encounter (Signed)
 Called the pt and there was no answer- LMTCB

## 2024-03-29 MED ORDER — BUDESONIDE-FORMOTEROL FUMARATE 160-4.5 MCG/ACT IN AERO: 2.0000 | INHALATION_SPRAY | Freq: Two times a day (BID) | RESPIRATORY_TRACT | 6 refills | Status: DC

## 2024-03-29 NOTE — Telephone Encounter (Signed)
 I called and spoke with the Sarah Reeves and notified of response from Tammy  She states that she thinks she may want to go back to the symbicort  and asked that we sent rx in case  I have sent this to her preferred pharmacy  She is aware not to take breo with symbicort   Nothing further needed

## 2024-03-29 NOTE — Telephone Encounter (Signed)
 Patient called back and left a voice mail on chest center phone [phone]

## 2024-03-30 ENCOUNTER — Telehealth: Payer: Self-pay | Admitting: *Deleted

## 2024-03-30 NOTE — Telephone Encounter (Signed)
 Copied from CRM [id number]. Topic: General - Other >> Mar 29, 2024 12:38 PM Alverda Joe S wrote: Reason for CRM: patient returning a call she received on Friday about information on her medication, please call patient back with details.

## 2024-03-30 NOTE — Telephone Encounter (Signed)
 Duplicate

## 2024-04-06 ENCOUNTER — Ambulatory Visit: Attending: Cardiovascular Disease | Admitting: Cardiovascular Disease

## 2024-04-06 ENCOUNTER — Encounter: Payer: Self-pay | Admitting: Cardiovascular Disease

## 2024-04-06 VITALS — BP 112/72 | HR 100 | Ht 63.0 in | Wt 144.0 lb

## 2024-04-06 DIAGNOSIS — I739 Peripheral vascular disease, unspecified: Secondary | ICD-10-CM

## 2024-04-06 NOTE — Progress Notes (Signed)
 Ms. Tribuzo returns today for follow-up.  She says that she is going down to [place]  for alcohol detox and rehab.  I still cannot elicit a good story for claudication since she has pain at rest, in bed also complains of knee and feet pain.  She does however have a high-grade right common iliac artery stenosis.  I told her that we will readdress her symptoms after she stopped smoking and drinking.  I will see her back in 6 months for follow-up.  Avanell Leigh, M.D., FACP, [facility], FAHA, [facility]  [address], [address] [place], [place]  [address]  [phone] 04/06/2024 10:39 AM

## 2024-04-06 NOTE — Patient Instructions (Signed)
 Medication Instructions:  Your physician recommends that you continue on your current medications as directed. Please refer to the Current Medication list given to you today.  *If you need a refill on your cardiac medications before your next appointment, please call your pharmacy*  Follow-Up: At [facility], you and your health needs are our priority.  As part of our continuing mission to provide you with exceptional heart care, our providers are all part of one team.  This team includes your primary Cardiologist (physician) and Advanced Practice Providers or APPs (Physician Assistants and Nurse Practitioners) who all work together to provide you with the care you need, when you need it.  Your next appointment:   6 month(s)  Provider:   Lauro Portal, MD    We recommend signing up for the patient portal called "MyChart".  Sign up information is provided on this After Visit Summary.  MyChart is used to connect with patients for Virtual Visits (Telemedicine).  Patients are able to view lab/test results, encounter notes, upcoming appointments, etc.  Non-urgent messages can be sent to your provider as well.   To learn more about what you can do with MyChart, go to [email or web address].

## 2024-04-08 DIAGNOSIS — F17200 Nicotine dependence, unspecified, uncomplicated: Secondary | ICD-10-CM | POA: Diagnosis not present

## 2024-04-08 DIAGNOSIS — F339 Major depressive disorder, recurrent, unspecified: Secondary | ICD-10-CM | POA: Diagnosis not present

## 2024-04-08 DIAGNOSIS — F411 Generalized anxiety disorder: Secondary | ICD-10-CM | POA: Diagnosis not present

## 2024-04-08 DIAGNOSIS — F1020 Alcohol dependence, uncomplicated: Secondary | ICD-10-CM | POA: Diagnosis not present

## 2024-04-08 NOTE — Progress Notes (Deleted)
 Sarah Console, PA-C [address] [place], [place]  [address] Phone: [phone]   Primary Care Physician: Corlis Pagan, NP  Primary Gastroenterologist:  Sarah Console, PA-C / Glendia Holt, MD   Chief Complaint:  F/U Abd pain, Diarrhea; Discuss EGD / Colon       HPI:   Sarah Reeves is a 63 y.o. female returns for follow-up of multiple GI issues.  Established patient of Dr. Holt.  Previously saw Alan Coombs, PA-C in 2024 to evaluate diarrhea and abdominal pain.  She was scheduled for repeat EGD and colonoscopy in hospital (due to being on oxygen  at night for COPD), however patient did not complete the procedures.  Current symptoms:  -09/2022: tested positive for C. difficile.  Treated with vancomycin . -12/2022: repeat C. difficile stool test negative.  -03/2015: Last Colonoscopy by Dr. Luis: Normal.   5-year repeat due to previous History of adenomatous colon polyps.   -03/2015: Last EGD by Dr. Luis (for dysphagia): Was Normal.  No evidence of esophageal stricture, Barrett's, hiatal hernia.  Normal esophagus, stomach, and duodenum.    PMH: History of GERD, Hx Colon Polyps, Hx C. 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  ______________________________________________________________________________________  GI HISTORY: We received the following records from Dr. Marshell office:   Colonoscopy September 07, 2001 Indication: Diarrhea, hematochezia Normal colon, patient recommended to repeat in 5 years (family history of polyps)   EGD September 07, 2001 Indication: Dysphagia, reflux symptoms Esophagus, no esophagitis or stricture Dilation performed with Savary dilator to 17 mm, no heme or resistance   EGD June 10, 2003 Indication: Dysphagia, 5 days of melena Normal endoscopy Dilation  performed with Savary dilator to 18 mm   Colonoscopy August 21, 2006 Indication family history of colorectal neoplasia Normal colonoscopy Recommended repeat 5 years   Colonoscopy June 28, 2010 Indication: Hemoccult positive stool 2 diminutive polyps in the rectosigmoid colon removed with forceps Pathology consistent with prolapsed polyp, negative for dysplasia/adenoma   EGD October 04, 2010 Indication: Dysphagia Dilation performed with savory dilator to 18 mm, no resistance or heme   Colonoscopy April 20, 2015 Indication: 5-year surveillance Normal colonoscopy Recommended repeat in 5 years because of personal history of adenoma   EGD April 20, 2015 Indication: Dysphagia  Normal EGD Dilation performed with Savary dilator to 18 mm, no resistance or heme   Liver biopsy January 12, 2002 Pathology showed findings consistent with chronic minimally active hepatitis C, inflammatory grade 1 and fibrosis stage 0  Current Outpatient Medications  Medication Sig Dispense Refill   Ascorbic Acid  (VITAMIN C ) 1000 MG tablet Take 1,000 mg by mouth daily.     budesonide -formoterol  (SYMBICORT ) 160-4.5 MCG/ACT inhaler Inhale 2 puffs into the lungs 2 (two) times daily. 1 each 6   cetirizine  (ZYRTEC ) 10 MG tablet Take 1 tablet (10 mg total) by mouth daily. 30 tablet 5   cilostazol  (PLETAL ) 50 MG tablet Take 1 tablet (50 mg total) by mouth 2 (two) times daily. (Patient not taking: Reported on 04/06/2024) 180 tablet 3   cyclobenzaprine  (FLEXERIL ) 10 MG tablet Take 10 mg by mouth 3 (three) times daily as needed for muscle spasms.     fluticasone  furoate-vilanterol (BREO ELLIPTA ) 200-25 MCG/ACT AEPB Inhale 1 puff into the lungs daily. 30 each 0   folic acid  (FOLVITE ) 1 MG tablet Take 1 tablet (1 mg total) by  mouth daily. 30 tablet 0   gabapentin  (NEURONTIN ) 100 MG capsule Take 2 capsules (200 mg total) by mouth 2 (two) times daily. 120 capsule 0   levalbuterol  (XOPENEX ) 0.63 MG/3ML nebulizer solution  Inhale 3 mLs (0.63 mg total) into the lungs every 8 (eight) hours as needed for wheezing. 3 mL 12   LORazepam  (ATIVAN ) 1 MG tablet Take 1 tablet (1 mg total) by mouth 3 (three) times daily. 30 tablet 0   methocarbamol (ROBAXIN) 750 MG tablet Take 750 mg by mouth 3 (three) times daily.     Multiple Vitamin (MULTIVITAMIN WITH MINERALS) TABS tablet Take 1 tablet by mouth daily. (Patient not taking: Reported on 04/06/2024) 30 tablet 0   nitroGLYCERIN  (NITROSTAT ) 0.4 MG SL tablet Place 0.4 mg under the tongue every 5 (five) minutes as needed for chest pain.     olmesartan  (BENICAR ) 40 MG tablet Take 1 tablet (40 mg total) by mouth daily. 30 tablet 6   thiamine  (VITAMIN B-1) 100 MG tablet Take 1 tablet (100 mg total) by mouth daily. 30 tablet 0   valACYclovir  (VALTREX ) 500 MG tablet Take 500 mg by mouth daily as needed.     Vitamin D , Cholecalciferol , 25 MCG (1000 UT) TABS Take 25 mcg by mouth daily. 60 tablet 3   No current facility-administered medications for this visit.   Facility-Administered Medications Ordered in Other Visits  Medication Dose Route Frequency Provider Last Rate Last Admin   chlorhexidine  (HIBICLENS ) 4 % liquid 1 application  1 application  Topical Once Curvin Mt III, MD       chlorhexidine  (HIBICLENS ) 4 % liquid 1 application  1 application  Topical Once Curvin Mt III, MD        Allergies as of 04/09/2024 - Review Complete 04/06/2024  Allergen Reaction Noted   Keflex  [cephalexin ] Hives, Itching, and Other (See Comments) 11/09/2022   Shellfish allergy  Other (See Comments) 04/30/2013   Vancomycin  Anaphylaxis 11/08/2022   Zoloft  [sertraline  hcl] Other (See Comments) 06/19/2015   Latex Itching 12/26/2014   Amitriptyline  01/28/2018   Clindamycin/lincomycin Hives and Other (See Comments) 06/25/2018   Other Other (See Comments) 10/31/2020   Penicillins  11/09/2018   Pentosan polysulfate sodium Swelling 09/07/2018   Shellfish-derived products Other (See Comments) 09/12/2021    Sulfa antibiotics Other (See Comments) 10/31/2020   Topamax [topiramate]  01/28/2018   Uribel [meth-hyo-m bl-na phos-ph sal] Nausea Only 10/31/2020   Uribel [urelle] Swelling and Other (See Comments) 06/25/2018   Lamotrigine Rash 10/31/2020   Sulfamethoxazole-trimethoprim Rash 10/01/2018    Past Medical History:  Diagnosis Date   Allergy     Angio-edema    Anxiety    Arthritis    Asthma    Clostridioides difficile infection    Complication of anesthesia    takes alot to sedated   COPD (chronic obstructive pulmonary disease) (HCC)    Depression    Emphysema of lung (HCC)    Fibromyalgia    GERD (gastroesophageal reflux disease)    Hepatitis C    (Dr. Luis) Treated with Harvoni March-May 2016   Herpes simplex    Hyperlipemia    Hypertension    Insomnia    Mental disorder    Neuropathy    Osteoporosis    Oxygen  deficiency    PONV (postoperative nausea and vomiting)    Sleep apnea    Substance abuse (HCC)    Urticaria     Past Surgical History:  Procedure Laterality Date   AUGMENTATION MAMMAPLASTY Bilateral  BREAST BIOPSY Left 11/2014   BREAST BIOPSY Left 12/2014   BREAST ENHANCEMENT SURGERY     BREAST LUMPECTOMY WITH RADIOACTIVE SEED LOCALIZATION Left 12/26/2014   Procedure: LEFT BREAST LUMPECTOMY WITH RADIOACTIVE SEED LOCALIZATION;  Surgeon: Deward Null III, MD;  Location: [facility];  Service: General;  Laterality: Left;   BREAST REDUCTION SURGERY     COLONOSCOPY     CT CTA CORONARY W/CA SCORE W/CM &/OR WO/CM  01/19/2023   CAC score 0.  Noncalcified ostial large D1 plaque (50 to 69%-CT FFR negative/nonobstructive).  Otherwise minimal noncalcified plaque in the proximal LAD and proximal RCA.   EXPLORATORY LAPAROTOMY     NM MYOVIEW  LTD  07/07/2015   Normal Myocardial Perfusion Scan. Low risk lexiscan  nuclear study with minimal insignificant breast attenuation and normal myocardial perfusion and function; EF 53% without wall motion abnormalities and  normal systolic thickening   TEMPOROMANDIBULAR JOINT SURGERY     TRANSTHORACIC ECHOCARDIOGRAM  03/11/2023   Normal LV size and function.  EF 60 to 65% with no RWMA.  GR 1 DD.  Normal RV size and function.  Normal RVP and RAP.  Moderate MAC but no MS or MR.   Normal AoV.   TUBAL LIGATION     UPPER GI ENDOSCOPY     x5    Review of Systems:    All systems reviewed and negative except where noted in HPI.    Physical Exam:  There were no vitals taken for this visit. No LMP recorded. Patient is postmenopausal.  General: Well-nourished, well-developed in no acute distress.  Lungs: Clear to auscultation bilaterally. Non-labored. Heart: Regular rate and rhythm, no murmurs rubs or gallops.  Abdomen: Bowel sounds are normal; Abdomen is Soft; No hepatosplenomegaly, masses or hernias;  No Abdominal Tenderness; No guarding or rebound tenderness. Neuro: Alert and oriented x 3.  Grossly intact.  Psych: Alert and cooperative, normal mood and affect.   Imaging Studies: No results found.  Labs: CBC    Component Value Date/Time   WBC 9.8 03/05/2024 2143   RBC 4.32 03/05/2024 2143   HGB 13.9 03/05/2024 2143   HCT 41.2 03/05/2024 2143   PLT 204 03/05/2024 2143   MCV 95.4 03/05/2024 2143   MCH 32.2 03/05/2024 2143   MCHC 33.7 03/05/2024 2143   RDW 12.8 03/05/2024 2143   LYMPHSABS 3.1 02/04/2024 2030   MONOABS 0.7 02/04/2024 2030   EOSABS 0.1 02/04/2024 2030   BASOSABS 0.1 02/04/2024 2030    CMP     Component Value Date/Time   NA 134 (L) 03/10/2024 1916   NA 134 07/11/2023 0000   K 3.8 03/10/2024 1916   CL 101 03/10/2024 1916   CO2 23 03/10/2024 1916   GLUCOSE 109 (H) 03/10/2024 1916   BUN 12 03/10/2024 1916   BUN 8 07/11/2023 0000   CREATININE 0.74 03/10/2024 1916   CALCIUM  9.7 03/10/2024 1916   CALCIUM  9.1 11/08/2022 0917   PROT 7.0 03/10/2024 1916   PROT 6.8 05/27/2023 1107   ALBUMIN 4.4 03/10/2024 1916   ALBUMIN 4.7 05/27/2023 1107   AST 38 03/10/2024 1916   ALT 36  03/10/2024 1916   ALKPHOS 54 03/10/2024 1916   BILITOT 0.7 03/10/2024 1916   BILITOT 0.2 05/27/2023 1107   GFRNONAA >60 03/10/2024 1916   GFRAA 101 11/10/2018 1014       Assessment and Plan:   TAQUANA BARTLEY is a 63 y.o. y/o female returns for follow-up of:  1.  GERD (without esophagitis) -  Continue PPI  2.  Dysphagia with remote history of esophageal stricture.  Last EGD in 2017 showed no stricture.   -Scheduling EGD with Dr. Marjo in Hospital. I discussed risks of EGD with patient to include risk of bleeding, perforation, and risk of sedation.  Patient expressed understanding and agrees to proceed with EGD.   3.  Alcohol dependence - Encourage cessation  4.  Chronic diarrhea.  Remote history of C. difficile treated 09/2022 with vancomycin .  Follow-up C. difficile test negative.  5.  History of adenomatous colon polyps - Scheduling Colonoscopy with Dr. Marjo in Hospital. I discussed risks of colonoscopy with patient to include risk of bleeding, colon perforation, and risk of sedation.  Patient expressed understanding and agrees to proceed with colonoscopy.   6.  History of COPD on oxygen  at night - Schedule EGD and colonoscopy in hospital    Sarah Console, PA-C  Follow up ***

## 2024-04-09 ENCOUNTER — Ambulatory Visit: Admitting: Physician Assistant

## 2024-04-12 ENCOUNTER — Ambulatory Visit: Payer: Self-pay | Attending: Cardiology | Admitting: Cardiology

## 2024-04-12 ENCOUNTER — Encounter: Payer: Self-pay | Admitting: Cardiology

## 2024-04-12 VITALS — BP 153/84 | HR 85 | Ht 63.0 in | Wt 144.0 lb

## 2024-04-12 DIAGNOSIS — E785 Hyperlipidemia, unspecified: Secondary | ICD-10-CM

## 2024-04-12 DIAGNOSIS — F1029 Alcohol dependence with unspecified alcohol-induced disorder: Secondary | ICD-10-CM

## 2024-04-12 DIAGNOSIS — I739 Peripheral vascular disease, unspecified: Secondary | ICD-10-CM

## 2024-04-12 DIAGNOSIS — Z716 Tobacco abuse counseling: Secondary | ICD-10-CM

## 2024-04-12 DIAGNOSIS — I2510 Atherosclerotic heart disease of native coronary artery without angina pectoris: Secondary | ICD-10-CM | POA: Diagnosis not present

## 2024-04-12 DIAGNOSIS — R079 Chest pain, unspecified: Secondary | ICD-10-CM

## 2024-04-12 DIAGNOSIS — I10 Essential (primary) hypertension: Secondary | ICD-10-CM | POA: Diagnosis not present

## 2024-04-12 DIAGNOSIS — K709 Alcoholic liver disease, unspecified: Secondary | ICD-10-CM

## 2024-04-12 MED ORDER — ROSUVASTATIN CALCIUM 10 MG PO TABS: 10.0000 mg | ORAL_TABLET | Freq: Every day | ORAL | 3 refills | Status: AC

## 2024-04-12 NOTE — Patient Instructions (Addendum)
 Medication Instructions:     Restart taking Olmesartan   ( Benicar ) 40 mg  daily   Restart taking Rosuvastatin  10 mg  daily   *If you need a refill on your cardiac medications before your next appointment, please call your pharmacy*  Other Instructions    You are okay to got to  [facility]ox [facility]hab from a cardiac standpoint.  Lab Work:  If you have labs (blood work) drawn today and your tests are completely normal, you will receive your results only by: MyChart Message (if you have MyChart) OR A paper copy in the mail If you have any lab test that is abnormal or we need to change your treatment, we will call you to review the results.   Testing/Procedures:  Not needed  Follow-Up: At [facility], you and your health needs are our priority.  As part of our continuing mission to provide you with exceptional heart care, we have created designated Provider Care Teams.  These Care Teams include your primary Cardiologist (physician) and Advanced Practice Providers (APPs -  Physician Assistants and Nurse Practitioners) who all work together to provide you with the care you need, when you need it.     Your next appointment:   12 month(s)  The format for your next appointment:   In Person  Provider:   Alm Clay, MD   Other Instructions    You are okay to got to  [facility]ox rehab from a cardiac standpoint.     KardiaMobile [email or web address] ( Purchase  [employer] , or website [employer], [facility], [employer]. Best buy      FDA-cleared, clinical grade mobile EKG monitor: Crist is the most clinically-validated mobile EKG used by the world's leading cardiac care medical professionals With Basic service, know instantly if your heart rhythm is normal or if atrial fibrillation is detected, and email the last single EKG recording to yourself or your doctor Premium service, available for purchase through the Kardia app for $9.99 per month or $99 per year, includes  unlimited history and storage of your EKG recordings, a monthly EKG summary report to share with your doctor, along with the ability to track your blood pressure, activity and weight Includes one K[employer]iaMobile phone clip FREE SHIPPING: Standard delivery 1-3 business days. Orders placed by 11:00am PST will ship that afternoon. Otherwise, will ship next business day. All orders ship via [employer] from [place], [place]    [employer] - sending an EKG Download app and set up profile. Run EKG - by placing 1-2 fingers on the silver plates After EKG is complete - Download PDF  - Skip password (if you apply a password the provider will need it to view the EKG) Click share button (square with upward arrow) in bottom left corner To send: choose MyChart (first time log into MyChart)  Pop up window about sending ECG Click continue Choose type of message Choose provider Type subject and message Click send (EKG should be attached)  - To send additional EKGs in one message click the paperclip image and bottom of page to attach.
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
## 2024-04-18 DIAGNOSIS — K709 Alcoholic liver disease, unspecified: Secondary | ICD-10-CM | POA: Insufficient documentation

## 2024-04-18 NOTE — Assessment & Plan Note (Signed)
 Minimal CAD noted on Coronary CTA.  Mostly noted in D1 that was not flow-limiting. I do think.  She would benefit from being on a statin especially in light of potential PAD as well.  Needs smoke cessation counseling but probably not worth until she has completed her alcohol withdrawal.  With her having PAD, I do recommend Aspir 81 milligram daily which she is not currently taking.  -Recommend 81 mg aspirin  - Recommend she restart her statin-rosuvastatin  10 mg daily.

## 2024-04-18 NOTE — Assessment & Plan Note (Addendum)
 Based on her March labs, her LDL was not adequately controlled for someone with evidence of CAD and PAD albeit likely not obstructive.  - Recommend she restart her rosuvastatin  10 mg daily once she has reinitiated therapy, we can reassess.

## 2024-04-18 NOTE — Assessment & Plan Note (Signed)
 She was told that there was evidence of fatty liver disease noted on monitor studies. Thankfully, LFTs seem to have been relatively well-controlled.   Nothing noted on recent CT scan to suggest fatty liver.  She should be fine to take her atorvastatin but I do think that alcohol cessation is crucial.

## 2024-04-18 NOTE — Assessment & Plan Note (Addendum)
 Hypertension with variable control due to stress, sleep issues, and inconsistent Benicar  use. Elevated blood pressure noted. - Instructed to take Benicar  40 mg daily post-meal, regardless of morning readings. - Educated on stress and sleep impact on blood pressure. - Advised regular blood pressure monitoring.

## 2024-04-18 NOTE — Assessment & Plan Note (Signed)
 I think this is something that she will build to get rid of until she gets rid of alcohol.

## 2024-04-18 NOTE — Assessment & Plan Note (Signed)
 Not necessarily noting leg pain with walking.  It seems to be diffuse pain.  We can reassess at follow-up and she would potentially want to be referred to a different PV cardiologist.  Will message Dr. Court to see if she can be transferred to either Dr. Darron or Dr. Ladona.

## 2024-04-18 NOTE — Assessment & Plan Note (Signed)
 Intermittent episodes of chest discomfort which is random and not exertional. She also has some occasional palpitations.  She is concerned about possible effects of potential exposure to Lyme disease.  I am not sure if this was ever fully evaluated but would defer to PCP.  With her intermittent palpitation spells, suggested potentially using Kardia-Mobile to evaluate her arrhythmias.  I suspected to simply PACs or PVCs increased by stress.
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
## 2024-04-19 ENCOUNTER — Ambulatory Visit: Payer: Self-pay

## 2024-04-19 NOTE — Telephone Encounter (Signed)
 I called and spoke with the pt She is c/o pain all over, weakness, eye pain insomnia going on or a while  Denies any respiratory co's, fevers She believes that these symptoms are directly related to use of inhalers  She was on symbicort  and then breztri   She has breo but is afraid to use it  E2C2 scheduled her for acute visit with Dr Neda for 04/30/24 for these symptoms  She does not want to cancel this bc she thinks her symptoms are from inhalers  She states that she does not wish to wait until then for recommendations from provider

## 2024-04-19 NOTE — Telephone Encounter (Signed)
 Sorry to hear, She can stop Breztri  for few days to see if symptoms resolve. Use Albuterol  inhaler As needed  for rescue-wheezing/shortness of breath  Also work on not smoking  Keep follow up ov for in person evaluation  Has multiple symptoms so could be unrelated to pulmonary issue, recommend follow up with PCP  Please contact office for sooner follow up if symptoms do not improve or worsen or seek emergency care

## 2024-04-19 NOTE — Telephone Encounter (Signed)
 Copied from CRM [id number]. Topic: Clinical - Red Word Triage >> Apr 19, 2024 10:05 AM Rilla NOVAK wrote: [facility] that prompted transfer to Nurse Triage: Weakness, Pain all over, severe eye pain, Cannot sleep, (thinks due to new medication)    ----------------------------------------------------------------------- From previous Reason for Contact - Scheduling: Patient/patient representative is calling to schedule an appointment. Refer to attachments for appointment information.    FYI Only or Action Required?: Action required by provider: medication refill request.  Patient is followed in Pulmonology for asthma, last seen on 02/10/2024 by Parrett, Madelin RAMAN, NP. Called Nurse Triage reporting Muscle Pain. Symptoms began ongoing problem. Symptoms are: unchanged.  Triage Disposition: See PCP Within 2 Weeks  Patient/caregiver understands and will follow disposition?: Yes    Reason for Disposition  Muscle aches are a chronic symptom (recurrent or ongoing AND present > 4 weeks)  Answer Assessment - Initial Assessment Questions Patient requesting prescription for Albuterol  Tartrate 45mg  rescue inhaler.      1. ONSET: When did the muscle aches or body pains start?      Going on for a while 2. LOCATION: What part of your body is hurting? (e.g., entire body, arms, legs)      Diffuse pain 3. SEVERITY: How bad is the pain? (Scale 1-10; or mild, moderate, severe)   - MILD (1-3): doesn't interfere with normal activities    - MODERATE (4-7): interferes with normal activities or awakens from sleep    - SEVERE (8-10):  excruciating pain, unable to do any normal activities      Moderate  4. CAUSE: What do you think is causing the pains?     Unsure  5. FEVER: Have you been having fever?     No 6. OTHER SYMPTOMS: Do you have any other symptoms? (e.g., chest pain, weakness, rash, cold or flu symptoms, weight loss)     Eye pain, weakness in legs, patient reports these are not new  symptoms  Protocols used: Muscle Aches and Body Pain-A-AH

## 2024-04-20 ENCOUNTER — Other Ambulatory Visit (HOSPITAL_BASED_OUTPATIENT_CLINIC_OR_DEPARTMENT_OTHER): Payer: Self-pay

## 2024-04-20 MED ORDER — ALBUTEROL SULFATE HFA 108 (90 BASE) MCG/ACT IN AERS: 2.0000 | INHALATION_SPRAY | RESPIRATORY_TRACT | 3 refills | Status: AC | PRN

## 2024-04-20 NOTE — Telephone Encounter (Signed)
 Pt notified and she needed a refill on Albuterol  so rx sent to pharmacy

## 2024-04-21 ENCOUNTER — Telehealth: Payer: Self-pay | Admitting: Cardiology

## 2024-04-21 DIAGNOSIS — J449 Chronic obstructive pulmonary disease, unspecified: Secondary | ICD-10-CM | POA: Diagnosis not present

## 2024-04-21 DIAGNOSIS — G4736 Sleep related hypoventilation in conditions classified elsewhere: Secondary | ICD-10-CM | POA: Diagnosis not present

## 2024-04-21 NOTE — Telephone Encounter (Signed)
 Patient states she spoke with her neighbor who told her about a device she wore around her neck to monitor for a-fib. She also states Dr. Anner discussed a monitoring device at last office visit, but she states she is not computer savvy and doesn't trust technology.  Patient is requesting a heart monitor to evaluate continued palpitations/fluttering. She is concerned she might have a-fib. She denies any chest pain or SOB. She states she has a lot of stress.  Will forward to Dr. Anner to review and advise.

## 2024-04-21 NOTE — Telephone Encounter (Signed)
 Calling about a device around your neck that can track you afib. Please advise

## 2024-04-28 DIAGNOSIS — F339 Major depressive disorder, recurrent, unspecified: Secondary | ICD-10-CM | POA: Diagnosis not present

## 2024-04-28 DIAGNOSIS — F411 Generalized anxiety disorder: Secondary | ICD-10-CM | POA: Diagnosis not present

## 2024-04-28 DIAGNOSIS — F410 Panic disorder [episodic paroxysmal anxiety] without agoraphobia: Secondary | ICD-10-CM | POA: Diagnosis not present

## 2024-04-28 DIAGNOSIS — F1010 Alcohol abuse, uncomplicated: Secondary | ICD-10-CM | POA: Diagnosis not present

## 2024-04-28 NOTE — Telephone Encounter (Signed)
 I am not aware of the device that you wear on your neck to track A-fib.  The event monitors that we order are for a finite amount of time either 1 or 2 weeks, up to 1 month.  If the frequency of these episodes are enough that we would potentially capture 1 or 2 episodes, I am fine with ordering a 30-day monitor.  Alm Clay, MD

## 2024-04-30 ENCOUNTER — Ambulatory Visit (INDEPENDENT_AMBULATORY_CARE_PROVIDER_SITE_OTHER): Admitting: Pulmonary Disease

## 2024-04-30 ENCOUNTER — Encounter (HOSPITAL_BASED_OUTPATIENT_CLINIC_OR_DEPARTMENT_OTHER): Admitting: Pulmonary Disease

## 2024-04-30 VITALS — BP 123/74 | HR 91 | Ht 63.5 in | Wt 141.0 lb

## 2024-04-30 DIAGNOSIS — J4489 Other specified chronic obstructive pulmonary disease: Secondary | ICD-10-CM

## 2024-04-30 DIAGNOSIS — F17210 Nicotine dependence, cigarettes, uncomplicated: Secondary | ICD-10-CM

## 2024-04-30 MED ORDER — BUDESONIDE-FORMOTEROL FUMARATE 80-4.5 MCG/ACT IN AERO: 2.0000 | INHALATION_SPRAY | Freq: Two times a day (BID) | RESPIRATORY_TRACT | 6 refills | Status: AC

## 2024-04-30 NOTE — Patient Instructions (Addendum)
 I will send in a prescription for Symbicort  80 - This is to be used 2 puffs twice a day  Continue using your albuterol  up to 4 times a day as needed  Continue to work on decreasing and possibly quitting smoking  Follow-up in about 4 months

## 2024-04-30 NOTE — Progress Notes (Signed)
 Sarah Reeves    [id number]    [birth year]  Primary Care Physician:Tate, Izetta, NP  Referring Physician: Corlis Izetta, NP [address] [place],  [place] [address]  Chief complaint:   Patient with history of COPD, asthma  HPI:  Asthma COPD overlap Continues to smoke  Not tolerating current inhalers Has been on several different inhalers including Breztri , Symbicort , Trelegy, Breo  She feels she prefers the Symbicort  the best -Has not been using this regularly  Cough, congestion, shortness of breath with activity  History of depression  She does participate in lung cancer screening program  Active smoker  She does have some issues with eye pressure, will be seen eye doctor in a few days - No diagnosis of glaucoma  Issues with alcohol use and significant smoking  She was accompanied by her friend today  Outpatient Encounter Medications as of 04/30/2024  Medication Sig   albuterol  (VENTOLIN  HFA) 108 (90 Base) MCG/ACT inhaler Inhale 2 puffs into the lungs every 4 (four) hours as needed for wheezing or shortness of breath.   Ascorbic Acid  (VITAMIN C ) 1000 MG tablet Take 1,000 mg by mouth daily.   budesonide -formoterol  (SYMBICORT ) 160-4.5 MCG/ACT inhaler Inhale 2 puffs into the lungs 2 (two) times daily.   cetirizine  (ZYRTEC ) 10 MG tablet Take 1 tablet (10 mg total) by mouth daily.   cilostazol  (PLETAL ) 50 MG tablet Take 1 tablet (50 mg total) by mouth 2 (two) times daily. (Patient not taking: Reported on 04/12/2024)   cyclobenzaprine  (FLEXERIL ) 10 MG tablet Take 10 mg by mouth 3 (three) times daily as needed for muscle spasms.   fluticasone  furoate-vilanterol (BREO ELLIPTA ) 200-25 MCG/ACT AEPB Inhale 1 puff into the lungs daily.   folic acid  (FOLVITE ) 1 MG tablet Take 1 tablet (1 mg total) by mouth daily.   gabapentin  (NEURONTIN ) 100 MG capsule Take 2 capsules (200 mg total) by mouth 2 (two) times daily.   levalbuterol  (XOPENEX ) 0.63 MG/3ML nebulizer  solution Inhale 3 mLs (0.63 mg total) into the lungs every 8 (eight) hours as needed for wheezing.   LORazepam  (ATIVAN ) 1 MG tablet Take 1 tablet (1 mg total) by mouth 3 (three) times daily.   methocarbamol (ROBAXIN) 750 MG tablet Take 750 mg by mouth 3 (three) times daily.   Multiple Vitamin (MULTIVITAMIN WITH MINERALS) TABS tablet Take 1 tablet by mouth daily. (Patient not taking: Reported on 04/30/2024)   nitroGLYCERIN  (NITROSTAT ) 0.4 MG SL tablet Place 0.4 mg under the tongue every 5 (five) minutes as needed for chest pain. (Patient not taking: Reported on 04/30/2024)   olmesartan  (BENICAR ) 40 MG tablet Take 1 tablet (40 mg total) by mouth daily. (Patient not taking: Reported on 04/30/2024)   rosuvastatin  (CRESTOR ) 10 MG tablet Take 1 tablet (10 mg total) by mouth daily.   thiamine  (VITAMIN B-1) 100 MG tablet Take 1 tablet (100 mg total) by mouth daily.   valACYclovir  (VALTREX ) 500 MG tablet Take 500 mg by mouth daily as needed.   Vitamin D , Cholecalciferol , 25 MCG (1000 UT) TABS Take 25 mcg by mouth daily.   Facility-Administered Encounter Medications as of 04/30/2024  Medication   chlorhexidine  (HIBICLENS ) 4 % liquid 1 application   chlorhexidine  (HIBICLENS ) 4 % liquid 1 application    Allergies as of 04/30/2024 - Review Complete 04/30/2024  Allergen Reaction Noted   Keflex  [cephalexin ] Hives, Itching, and Other (See Comments) 11/09/2022   Shellfish allergy  Other (See Comments) 04/30/2013   Vancomycin  Anaphylaxis  11/08/2022   Zoloft  [sertraline  hcl] Other (See Comments) 06/19/2015   Latex Itching 12/26/2014   Amitriptyline  01/28/2018   Clindamycin/lincomycin Hives and Other (See Comments) 06/25/2018   Other Other (See Comments) 10/31/2020   Penicillins  11/09/2018   Pentosan polysulfate sodium Swelling 09/07/2018   Shellfish-derived products Other (See Comments) 09/12/2021   Sulfa antibiotics Other (See Comments) 10/31/2020   Topamax [topiramate]  01/28/2018   Uribel [meth-hyo-m  bl-na phos-ph sal] Nausea Only 10/31/2020   Uribel [urelle] Swelling and Other (See Comments) 06/25/2018   Lamotrigine Rash 10/31/2020   Sulfamethoxazole-trimethoprim Rash 10/01/2018    Past Medical History:  Diagnosis Date   Allergy     Angio-edema    Anxiety    Arthritis    Asthma    Clostridioides difficile infection    Complication of anesthesia    takes alot to sedated   COPD (chronic obstructive pulmonary disease) (HCC)    Depression    Emphysema of lung (HCC)    Fibromyalgia    GERD (gastroesophageal reflux disease)    Hepatitis C    (Dr. Luis) Treated with Harvoni March-May 2016   Herpes simplex    Hyperlipemia    Hypertension    Insomnia    Mental disorder    Neuropathy    Osteoporosis    Oxygen  deficiency    PONV (postoperative nausea and vomiting)    Sleep apnea    Substance abuse (HCC)    Urticaria     Past Surgical History:  Procedure Laterality Date   AUGMENTATION MAMMAPLASTY Bilateral    BREAST BIOPSY Left 11/2014   BREAST BIOPSY Left 12/2014   BREAST ENHANCEMENT SURGERY     BREAST LUMPECTOMY WITH RADIOACTIVE SEED LOCALIZATION Left 12/26/2014   Procedure: LEFT BREAST LUMPECTOMY WITH RADIOACTIVE SEED LOCALIZATION;  Surgeon: Deward Null III, MD;  Location: [facility];  Service: General;  Laterality: Left;   BREAST REDUCTION SURGERY     COLONOSCOPY     CT CTA CORONARY W/CA SCORE W/CM &/OR WO/CM  01/19/2023   CAC score 0.  Noncalcified ostial large D1 plaque (50 to 69%-CT FFR negative/nonobstructive).  Otherwise minimal noncalcified plaque in the proximal LAD and proximal RCA.   EXPLORATORY LAPAROTOMY     NM MYOVIEW  LTD  07/07/2015   Normal Myocardial Perfusion Scan. Low risk lexiscan  nuclear study with minimal insignificant breast attenuation and normal myocardial perfusion and function; EF 53% without wall motion abnormalities and normal systolic thickening   TEMPOROMANDIBULAR JOINT SURGERY     TRANSTHORACIC ECHOCARDIOGRAM  03/11/2023    Normal LV size and function.  EF 60 to 65% with no RWMA.  GR 1 DD.  Normal RV size and function.  Normal RVP and RAP.  Moderate MAC but no MS or MR.   Normal AoV.   TUBAL LIGATION     UPPER GI ENDOSCOPY     x5    Family History  Problem Relation Age of Onset   Diabetes Mother    Lung disease Mother    Heart disease Mother    Breast cancer Mother    Heart disease Father    Colon cancer Father    Hypothyroidism Brother    Alzheimer's disease Maternal Grandmother    Alzheimer's disease Maternal Aunt    Skin cancer Maternal Aunt    Stomach cancer Neg Hx    Esophageal cancer Neg Hx     Social History   Socioeconomic History   Marital status: Married    Spouse name: Not on  file   Number of children: 0   Years of education: 12   Highest education level: Not on file  Occupational History   Occupation: Retired   Occupation: retired  Tobacco Use   Smoking status: Every Day    Current packs/day: 1.00    Average packs/day: 1 pack/day for 47.0 years (47.0 ttl pk-yrs)    Types: Cigarettes    Passive exposure: Current   Smokeless tobacco: Never   Tobacco comments:    Had a bad break up with boyfriend, she has been smoking and drinking.  10 cigarettes/day.  02/10/2024 hfb  Vaping Use   Vaping status: Never Used  Substance and Sexual Activity   Alcohol use: Yes    Alcohol/week: 49.0 standard drinks of alcohol    Types: 49 Shots of liquor per week    Comment: Drinking 5-6 drinks per day (grey goose).  02/10/2024 hfb   Drug use: No   Sexual activity: Yes    Birth control/protection: Surgical  Other Topics Concern   Not on file  Social History Narrative   Patient is right handed.   Four cups caffeine per day.   Lives at home with husband.   Social Drivers of Corporate investment banker Strain: Not on file  Food Insecurity: No Food Insecurity (03/06/2024)   Hunger Vital Sign    Worried About Running Out of Food in the Last Year: Never true    Ran Out of Food in the Last Year:  Never true  Transportation Needs: No Transportation Needs (03/06/2024)   PRAPARE - Administrator, Civil Service (Medical): No    Lack of Transportation (Non-Medical): No  Physical Activity: Not on file  Stress: Not on file  Social Connections: Unknown (02/25/2023)   Received from [facility]   Social Network    Social Network: Not on file  Intimate Partner Violence: Not At Risk (03/06/2024)   Humiliation, Afraid, Rape, and Kick questionnaire    Fear of Current or Ex-Partner: No    Emotionally Abused: No    Physically Abused: No    Sexually Abused: No    Review of Systems  Constitutional:  Positive for fatigue.  Respiratory:  Positive for shortness of breath.   Psychiatric/Behavioral:  Positive for sleep disturbance.     Vitals:   04/30/24 1127  BP: 123/74  Pulse: 91  SpO2: 96%     Physical Exam Constitutional:      Appearance: She is obese.  HENT:     Head: Normocephalic.     Nose: Nose normal.     Mouth/Throat:     Mouth: Mucous membranes are moist.  Eyes:     General: No scleral icterus.    Pupils: Pupils are equal, round, and reactive to light.  Cardiovascular:     Rate and Rhythm: Normal rate and regular rhythm.     Heart sounds: No murmur heard.    No friction rub.  Pulmonary:     Effort: No respiratory distress.     Breath sounds: No stridor. No wheezing or rhonchi.  Musculoskeletal:     Cervical back: No rigidity or tenderness.  Neurological:     Mental Status: She is alert.  Psychiatric:        Mood and Affect: Mood normal.    Data Reviewed: CT scan reviewed significant for evidence of some emphysema  Previous PFT did not show significant obstructive disease  Recent sleep study with nocturnal hypoxemia  Assessment:  Asthma COPD overlap -  Emphysema on previous CT  Active smoker  Chronic fatigue  Chronic cough  Deconditioning  Nocturnal desaturated  Plan/Recommendations: Continue oxygen  supplementation at  night  Prescription for Symbicort  80 will be sent into pharmacy  She was encouraged to put away all the medicines that she brought to the visit today including Trelegy, Breo, states she has Symbicort  at home and also Breztri  at home  I did spend time clarifying that we only need one of his inhalers to be used on a regular basis and then make an assessment whether it is helping or not tolerated She chooses to use Symbicort  - Will place a prescription for Symbicort  80, 2 puffs twice daily  She does have an appointment to see an ophthalmologist about high pressure  Risk of continuing to smoke discussed  I spent 30 minutes dedicated to the care of this patient on the date of this encounter to include previsit review of records, face-to-face time with the patient discussing conditions above, post visit ordering of testing,ordering medications,independentlyinterpreting results, clinical documentation with electronic health record and communicated necessary findings to members of the patient's care team    Jennet Epley MD [facility] 04/30/2024, 11:41 AM  CC: Corlis Pagan, NP

## 2024-05-05 DIAGNOSIS — H01012 Ulcerative blepharitis right lower eyelid: Secondary | ICD-10-CM | POA: Diagnosis not present

## 2024-05-05 DIAGNOSIS — H01011 Ulcerative blepharitis right upper eyelid: Secondary | ICD-10-CM | POA: Diagnosis not present

## 2024-05-22 DIAGNOSIS — J449 Chronic obstructive pulmonary disease, unspecified: Secondary | ICD-10-CM | POA: Diagnosis not present

## 2024-05-22 DIAGNOSIS — G4736 Sleep related hypoventilation in conditions classified elsewhere: Secondary | ICD-10-CM | POA: Diagnosis not present

## 2024-05-24 DIAGNOSIS — M549 Dorsalgia, unspecified: Secondary | ICD-10-CM | POA: Diagnosis not present

## 2024-05-24 DIAGNOSIS — E871 Hypo-osmolality and hyponatremia: Secondary | ICD-10-CM | POA: Diagnosis not present

## 2024-05-24 DIAGNOSIS — H5789 Other specified disorders of eye and adnexa: Secondary | ICD-10-CM | POA: Diagnosis not present

## 2024-05-24 DIAGNOSIS — I10 Essential (primary) hypertension: Secondary | ICD-10-CM | POA: Diagnosis not present

## 2024-05-24 DIAGNOSIS — R739 Hyperglycemia, unspecified: Secondary | ICD-10-CM | POA: Diagnosis not present

## 2024-05-24 DIAGNOSIS — R5383 Other fatigue: Secondary | ICD-10-CM | POA: Diagnosis not present

## 2024-05-24 DIAGNOSIS — R051 Acute cough: Secondary | ICD-10-CM | POA: Diagnosis not present

## 2024-05-24 DIAGNOSIS — F1010 Alcohol abuse, uncomplicated: Secondary | ICD-10-CM | POA: Diagnosis not present

## 2024-05-24 DIAGNOSIS — L299 Pruritus, unspecified: Secondary | ICD-10-CM | POA: Diagnosis not present

## 2024-05-24 DIAGNOSIS — R0989 Other specified symptoms and signs involving the circulatory and respiratory systems: Secondary | ICD-10-CM | POA: Diagnosis not present

## 2024-05-24 DIAGNOSIS — Z85828 Personal history of other malignant neoplasm of skin: Secondary | ICD-10-CM | POA: Diagnosis not present

## 2024-05-26 ENCOUNTER — Emergency Department (HOSPITAL_COMMUNITY)
Admission: EM | Admit: 2024-05-26 | Discharge: 2024-05-26 | Disposition: A | Discharge status: Home / Self Care | Attending: Emergency Medicine | Admitting: Emergency Medicine

## 2024-05-26 ENCOUNTER — Encounter (HOSPITAL_COMMUNITY): Payer: Self-pay

## 2024-05-26 ENCOUNTER — Other Ambulatory Visit: Payer: Self-pay

## 2024-05-26 DIAGNOSIS — M545 Low back pain, unspecified: Secondary | ICD-10-CM | POA: Diagnosis not present

## 2024-05-26 DIAGNOSIS — Z91040 Latex allergy status: Secondary | ICD-10-CM | POA: Diagnosis not present

## 2024-05-26 DIAGNOSIS — R799 Abnormal finding of blood chemistry, unspecified: Secondary | ICD-10-CM | POA: Diagnosis not present

## 2024-05-26 DIAGNOSIS — E871 Hypo-osmolality and hyponatremia: Secondary | ICD-10-CM | POA: Insufficient documentation

## 2024-05-26 DIAGNOSIS — E878 Other disorders of electrolyte and fluid balance, not elsewhere classified: Secondary | ICD-10-CM | POA: Insufficient documentation

## 2024-05-26 LAB — COMPREHENSIVE METABOLIC PANEL WITH GFR
ALT: 25 U/L (ref 0–44)
AST: 29 U/L (ref 15–41)
Albumin: 3.9 g/dL (ref 3.5–5.0)
Alkaline Phosphatase: 55 U/L (ref 38–126)
Anion gap: 10 (ref 5–15)
BUN: 8 mg/dL (ref 8–23)
CO2: 24 mmol/L (ref 22–32)
Calcium: 8.6 mg/dL — ABNORMAL LOW (ref 8.9–10.3)
Chloride: 95 mmol/L — ABNORMAL LOW (ref 98–111)
Creatinine, Ser: 0.61 mg/dL (ref 0.44–1.00)
GFR, Estimated: >60 mL/min (ref >60)
Glucose, Bld: 98 mg/dL (ref 70–99)
Potassium: 3.5 mmol/L (ref 3.5–5.1)
Sodium: 129 mmol/L — ABNORMAL LOW (ref 135–145)
Total Bilirubin: 0.6 mg/dL (ref 0.0–1.2)
Total Protein: 6.7 g/dL (ref 6.5–8.1)

## 2024-05-26 LAB — CBC WITH DIFFERENTIAL/PLATELET
Abs Immature Granulocytes: 0.01 K/uL (ref 0.00–0.07)
Basophils Absolute: 0 K/uL (ref 0.0–0.1)
Basophils Relative: 1 %
Eosinophils Absolute: 0.1 K/uL (ref 0.0–0.5)
Eosinophils Relative: 1 %
HCT: 36.4 % (ref 36.0–46.0)
Hemoglobin: 12.3 g/dL (ref 12.0–15.0)
Immature Granulocytes: 0 %
Lymphocytes Relative: 44 %
Lymphs Abs: 3.3 K/uL (ref 0.7–4.0)
MCH: 31.9 pg (ref 26.0–34.0)
MCHC: 33.8 g/dL (ref 30.0–36.0)
MCV: 94.3 fL (ref 80.0–100.0)
Monocytes Absolute: 0.5 K/uL (ref 0.1–1.0)
Monocytes Relative: 7 %
Neutro Abs: 3.5 K/uL (ref 1.7–7.7)
Neutrophils Relative %: 47 %
Platelets: 201 K/uL (ref 150–400)
RBC: 3.86 MIL/uL — ABNORMAL LOW (ref 3.87–5.11)
RDW: 12.9 % (ref 11.5–15.5)
WBC: 7.5 K/uL (ref 4.0–10.5)
nRBC: 0 % (ref 0.0–0.2)

## 2024-05-26 MED ORDER — SODIUM CHLORIDE 0.9 % IV BOLUS
1000.0000 mL | Freq: Once | INTRAVENOUS | Status: AC
Start: 2024-05-26 — End: 2024-05-26
  Administered 2024-05-26: 1000 mL via INTRAVENOUS

## 2024-05-26 NOTE — ED Provider Notes (Signed)
 [place] EMERGENCY DEPARTMENT AT [facility] Provider Note   CSN: [id number] Arrival date & time: 05/26/24  1024     Patient presents with: No chief complaint on file.   Sarah Reeves is a 63 y.o. female. Patient with past medical history significant for alcohol use disorder, hyponatremia, peripheral artery disease, MDD, PTSD, and osteopenia presents to the ED with concerns of abnormal labs. She reports that she was seen by her PCP recently and advised to come into the ED for concerns of hyponatremia. She has reportedly had persistent hyponatremia for several months assumed to likely be secondary to alcohol use.  HPI     Prior to Admission medications   Medication Sig Start Date End Date Taking? Authorizing Provider  albuterol  (VENTOLIN  HFA) 108 (90 Base) MCG/ACT inhaler Inhale 2 puffs into the lungs every 4 (four) hours as needed for wheezing or shortness of breath. 04/20/24   Parrett, Madelin RAMAN, NP  Ascorbic Acid  (VITAMIN C ) 1000 MG tablet Take 1,000 mg by mouth daily.    [provider]  budesonide -formoterol  (SYMBICORT ) 80-4.5 MCG/ACT inhaler Inhale 2 puffs into the lungs every 12 (twelve) hours. 04/30/24   Olalere, Jennet LABOR, MD  cetirizine  (ZYRTEC ) 10 MG tablet Take 1 tablet (10 mg total) by mouth daily. 09/09/22   Cari Arlean CHRISTELLA, FNP  cilostazol  (PLETAL ) 50 MG tablet Take 1 tablet (50 mg total) by mouth 2 (two) times daily. Patient not taking: Reported on 04/12/2024 10/07/23   Court Dorn PARAS, MD  cyclobenzaprine  (FLEXERIL ) 10 MG tablet Take 10 mg by mouth 3 (three) times daily as needed for muscle spasms.    [provider]  folic acid  (FOLVITE ) 1 MG tablet Take 1 tablet (1 mg total) by mouth daily. 03/12/24   Jadapalle, Sree, MD  gabapentin  (NEURONTIN ) 100 MG capsule Take 2 capsules (200 mg total) by mouth 2 (two) times daily. 03/11/24   Jadapalle, Sree, MD  levalbuterol  (XOPENEX ) 0.63 MG/3ML nebulizer solution Inhale 3 mLs (0.63 mg total) into the lungs  every 8 (eight) hours as needed for wheezing. 03/11/24   Donnelly Mellow, MD  LORazepam  (ATIVAN ) 1 MG tablet Take 1 tablet (1 mg total) by mouth 3 (three) times daily. 03/11/24   Jadapalle, Sree, MD  methocarbamol (ROBAXIN) 750 MG tablet Take 750 mg by mouth 3 (three) times daily. 12/09/23   [provider]  Multiple Vitamin (MULTIVITAMIN WITH MINERALS) TABS tablet Take 1 tablet by mouth daily. Patient not taking: Reported on 04/30/2024 03/12/24   Jadapalle, Sree, MD  nitroGLYCERIN  (NITROSTAT ) 0.4 MG SL tablet Place 0.4 mg under the tongue every 5 (five) minutes as needed for chest pain. Patient not taking: Reported on 04/30/2024    [provider]  olmesartan  (BENICAR ) 40 MG tablet Take 1 tablet (40 mg total) by mouth daily. Patient not taking: Reported on 04/30/2024 02/26/24   Daneen Damien BROCKS, NP  rosuvastatin  (CRESTOR ) 10 MG tablet Take 1 tablet (10 mg total) by mouth daily. 04/12/24 07/11/24  Anner Alm ORN, MD  thiamine  (VITAMIN B-1) 100 MG tablet Take 1 tablet (100 mg total) by mouth daily. 03/12/24   Jadapalle, Sree, MD  valACYclovir  (VALTREX ) 500 MG tablet Take 500 mg by mouth daily as needed.    [provider]  Vitamin D , Cholecalciferol , 25 MCG (1000 UT) TABS Take 25 mcg by mouth daily. 11/27/22   Jerilynn Lamarr CHRISTELLA, NP    Allergies: Keflex  [cephalexin ], Shellfish allergy , Vancomycin , Zoloft  [sertraline  hcl], Latex, Amitriptyline, Clindamycin/lincomycin, Other, Penicillins, Pentosan polysulfate  sodium, Shellfish-derived products, Sulfa antibiotics, Topamax [topiramate], Uribel [meth-hyo-m bl-na phos-ph sal], Uribel [urelle], Lamotrigine, and Sulfamethoxazole-trimethoprim    Review of Systems  Constitutional:        Abnormal labs  All other systems reviewed and are negative.   Updated Vital Signs BP (!) 146/73   Pulse 87   Temp 98.1 F (36.7 C) (Oral)   Resp 19   Ht 5' 3 (1.6 m)   Wt 65.3 kg   SpO2 98%   BMI 25.51 kg/m   Physical Exam Vitals and nursing  note reviewed.  Constitutional:      General: She is not in acute distress.    Appearance: She is well-developed.  HENT:     Head: Normocephalic and atraumatic.  Eyes:     Conjunctiva/sclera: Conjunctivae normal.  Cardiovascular:     Rate and Rhythm: Normal rate and regular rhythm.     Heart sounds: No murmur heard. Pulmonary:     Effort: Pulmonary effort is normal. No respiratory distress.     Breath sounds: Normal breath sounds.  Abdominal:     Palpations: Abdomen is soft.     Tenderness: There is no abdominal tenderness.  Musculoskeletal:        General: No swelling.     Cervical back: Neck supple.  Skin:    General: Skin is warm and dry.     Capillary Refill: Capillary refill takes less than 2 seconds.  Neurological:     Mental Status: She is alert.  Psychiatric:        Mood and Affect: Mood normal.     (all labs ordered are listed, but only abnormal results are displayed) Labs Reviewed  CBC WITH DIFFERENTIAL/PLATELET - Abnormal; Notable for the following components:      Result Value   RBC 3.86 (*)    All other components within normal limits  COMPREHENSIVE METABOLIC PANEL WITH GFR - Abnormal; Notable for the following components:   Sodium 129 (*)    Chloride 95 (*)    Calcium  8.6 (*)    All other components within normal limits    EKG: None  Radiology: No results found.   Procedures   Medications Ordered in the ED  sodium chloride  0.9 % bolus 1,000 mL (0 mLs Intravenous Stopped 05/26/24 1405)                                   Medical Decision Making Amount and/or Complexity of Data Reviewed Labs: ordered.   This patient presents to the ED for concern of abnormal labs, this involves an extensive number of treatment options, and is a complaint that carries with it a high risk of complications and morbidity.  The differential diagnosis includes hyponatremia, dehydration, alcohol intoxication, substance use disorder   Co morbidities that complicate  the patient evaluation  Chronic hyponatremia, alcohol use disorder   Lab Tests:  I Ordered, and personally interpreted labs.  The pertinent results include: CBC unremarkable, CMP with mild hyponatremia and hypochloremia with sodium at 129 and chloride at 95, no evidence of AKI   Consultations Obtained:  I requested consultation with none,  and discussed lab and imaging findings as well as pertinent plan - they recommend: N/A   Problem List / ED Course / Critical interventions / Medication management  Patient with past history significant for chronic hyponatremia presents the emergency department with concerns of abnormal labs.  Patient reportedly was told  by primary care provider to come to the emergency department for concerns of hyponatremia.  Reportedly had sodium level at 126.  Patient averages between 126-128.  She states that she has had chronic ongoing issues with hyponatremia for several months.  Had previously been taking salt tablets without been on this for some time since she was discharged from the hospital. On exam, patient has no focal findings or deficits. Has no other acute concerns but wants to assess her sodium level. Patient's labs are largely reassuring. Sodium at 129, better than PCP lab and baseline. Fluids ordered. CBC unremarkable. Given lack of symptoms, do not feel that she requires emergent correction of sodium level. I do suspect this is largely secondary to patient's alcohol use. Will discharge home with strict return precautions such as concerns for new or worsening symptoms. I ordered medication including fluids  for hyponatremia  Reevaluation of the patient after these medicines showed that the patient improved I have reviewed the patients home medicines and have made adjustments as needed   Social Determinants of Health:  Alcohol use disorder   Test / Admission - Considered:  Admission considered but stable for outpatient follow up  Final diagnoses:   Hyponatremia  Hypochloremia    ED Discharge Orders     None          Cecily Legrand LABOR, PA-C 05/26/24 1534    Lenor Hollering, MD 05/26/24 1535

## 2024-05-26 NOTE — ED Notes (Signed)
 Assisted pt on the bedside commode.

## 2024-05-26 NOTE — Discharge Instructions (Signed)
 You were seen in the ER today for concerns of abnormal labs. You were found to have hyponatremia, but this is fairly close to your normal levels so you do not require urgent hospitalization or interventions. I do believe this is largely from your alcohol consumption and would urge you to reduce the amount you are drinking. In the meantime, you can increase your salt intake through you diet to try to supplement some of the issues of your sodium staying persistently low. For any concerns of new or worsening symptoms, return to the ER.

## 2024-05-26 NOTE — ED Triage Notes (Signed)
 Patient BIB [facility] from home. Patient states she had labs drawn at her PCP on Monday May 24, 2024 at her PCP. She received a call telling her to go to the ER for low sodium Patient reports her sodium was 126. Patient also states her chloride was low as well. Patient has a hx of low sodium and chronic back pain. Pain also states she accidentally took two olmesatran pills this morning along with flexeril  for her chronic back pain. Patient also states she drank 4 truliys to help with the back pain. EMS placed 18g in left AC and gave 50ml of NS.

## 2024-06-01 DIAGNOSIS — I10 Essential (primary) hypertension: Secondary | ICD-10-CM | POA: Diagnosis not present

## 2024-06-01 DIAGNOSIS — F1010 Alcohol abuse, uncomplicated: Secondary | ICD-10-CM | POA: Diagnosis not present

## 2024-06-01 DIAGNOSIS — M545 Low back pain, unspecified: Secondary | ICD-10-CM | POA: Diagnosis not present

## 2024-06-01 DIAGNOSIS — E871 Hypo-osmolality and hyponatremia: Secondary | ICD-10-CM | POA: Diagnosis not present
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
## 2024-06-04 ENCOUNTER — Ambulatory Visit: Admitting: Physician Assistant

## 2024-06-04 ENCOUNTER — Encounter: Payer: Self-pay | Admitting: Physician Assistant

## 2024-06-04 ENCOUNTER — Other Ambulatory Visit (INDEPENDENT_AMBULATORY_CARE_PROVIDER_SITE_OTHER)

## 2024-06-04 VITALS — BP 118/64 | HR 78 | Ht 63.5 in | Wt 140.0 lb

## 2024-06-04 DIAGNOSIS — Z205 Contact with and (suspected) exposure to viral hepatitis: Secondary | ICD-10-CM

## 2024-06-04 DIAGNOSIS — F1029 Alcohol dependence with unspecified alcohol-induced disorder: Secondary | ICD-10-CM

## 2024-06-04 DIAGNOSIS — J9611 Chronic respiratory failure with hypoxia: Secondary | ICD-10-CM

## 2024-06-04 DIAGNOSIS — Z8619 Personal history of other infectious and parasitic diseases: Secondary | ICD-10-CM | POA: Diagnosis not present

## 2024-06-04 DIAGNOSIS — K529 Noninfective gastroenteritis and colitis, unspecified: Secondary | ICD-10-CM

## 2024-06-04 DIAGNOSIS — R1010 Upper abdominal pain, unspecified: Secondary | ICD-10-CM

## 2024-06-04 DIAGNOSIS — F1020 Alcohol dependence, uncomplicated: Secondary | ICD-10-CM | POA: Diagnosis not present

## 2024-06-04 DIAGNOSIS — E871 Hypo-osmolality and hyponatremia: Secondary | ICD-10-CM | POA: Diagnosis not present

## 2024-06-04 DIAGNOSIS — K219 Gastro-esophageal reflux disease without esophagitis: Secondary | ICD-10-CM

## 2024-06-04 DIAGNOSIS — J449 Chronic obstructive pulmonary disease, unspecified: Secondary | ICD-10-CM

## 2024-06-04 DIAGNOSIS — Z860101 Personal history of adenomatous and serrated colon polyps: Secondary | ICD-10-CM

## 2024-06-04 DIAGNOSIS — F17210 Nicotine dependence, cigarettes, uncomplicated: Secondary | ICD-10-CM

## 2024-06-04 DIAGNOSIS — K580 Irritable bowel syndrome with diarrhea: Secondary | ICD-10-CM

## 2024-06-04 LAB — COMPREHENSIVE METABOLIC PANEL WITH GFR
ALT: 21 U/L (ref 0–35)
AST: 26 U/L (ref 0–37)
Albumin: 4.7 g/dL (ref 3.5–5.2)
Alkaline Phosphatase: 56 U/L (ref 39–117)
BUN: 7 mg/dL (ref 6–23)
CO2: 27 meq/L (ref 19–32)
Calcium: 9.7 mg/dL (ref 8.4–10.5)
Chloride: 94 meq/L — ABNORMAL LOW (ref 96–112)
Creatinine, Ser: 0.61 mg/dL (ref 0.40–1.20)
GFR: 96.13 mL/min (ref >60.00)
Glucose, Bld: 97 mg/dL (ref 70–99)
Potassium: 4.3 meq/L (ref 3.5–5.1)
Sodium: 132 meq/L — ABNORMAL LOW (ref 135–145)
Total Bilirubin: 0.5 mg/dL (ref 0.2–1.2)
Total Protein: 7.3 g/dL (ref 6.0–8.3)

## 2024-06-04 LAB — GAMMA GT: GGT: 29 U/L (ref 7–51)

## 2024-06-04 MED ORDER — NA SULFATE-K SULFATE-MG SULF 17.5-3.13-1.6 GM/177ML PO SOLN
1.0000 | Freq: Once | ORAL | 0 refills | Status: AC
Start: 2024-06-04 — End: 2024-06-04

## 2024-06-04 MED ORDER — FAMOTIDINE 20 MG PO TABS: 20.0000 mg | ORAL_TABLET | Freq: Two times a day (BID) | ORAL | 5 refills | Status: AC

## 2024-06-04 NOTE — Patient Instructions (Signed)
 Your provider has requested that you go to the basement level for lab work before leaving today. Press B on the elevator. The lab is located at the first door on the left as you exit the elevator.  We have sent the following medications to your pharmacy for you to pick up at your convenience: famotidine  20 mg twice daily.   You have been scheduled for an endoscopy and colonoscopy. Please follow the written instructions given to you at your visit today.  If you use inhalers (even only as needed), please bring them with you on the day of your procedure.  DO NOT TAKE 7 DAYS PRIOR TO TEST- Trulicity (dulaglutide) Ozempic, Wegovy (semaglutide) Mounjaro (tirzepatide) Bydureon Bcise (exanatide extended release)  DO NOT TAKE 1 DAY PRIOR TO YOUR TEST Rybelsus (semaglutide) Adlyxin (lixisenatide) Victoza (liraglutide) Byetta (exanatide) ___________________________________________________________________________  _______________________________________________________  If your blood pressure at your visit was 140/90 or greater, please contact your primary care physician to follow up on this.  _______________________________________________________  If you are age 62 or older, your body mass index should be between 23-30. Your Body mass index is 24.41 kg/m. If this is out of the aforementioned range listed, please consider follow up with your Primary Care Provider.  If you are age 24 or younger, your body mass index should be between 19-25. Your Body mass index is 24.41 kg/m. If this is out of the aformentioned range listed, please consider follow up with your Primary Care Provider.   ________________________________________________________  The [facility] providers would like to encourage you to use MYCHART to communicate with providers for non-urgent requests or questions.  Due to long hold times on the telephone, sending your provider a message by [facility] may be a faster and more  efficient way to get a response.  Please allow 48 business hours for a response.  Please remember that this is for non-urgent requests.  _______________________________________________________  [facility] is using a team-based approach to care.  Your team is made up of your doctor and two to three APPS. Our APPS (Nurse Practitioners and Physician Assistants) work with your physician to ensure care continuity for you. They are fully qualified to address your health concerns and develop a treatment plan. They communicate directly with your gastroenterologist to care for you. Seeing the Advanced Practice Practitioners on your physician's team can help you by facilitating care more promptly, often allowing for earlier appointments, access to diagnostic testing, procedures, and other specialty referrals.

## 2024-06-07 ENCOUNTER — Ambulatory Visit: Payer: Self-pay | Admitting: Physician Assistant

## 2024-06-07 ENCOUNTER — Encounter: Payer: Self-pay | Admitting: Gastroenterology

## 2024-06-07 DIAGNOSIS — S0502XA Injury of conjunctiva and corneal abrasion without foreign body, left eye, initial encounter: Secondary | ICD-10-CM | POA: Diagnosis not present

## 2024-06-08 DIAGNOSIS — R7301 Impaired fasting glucose: Secondary | ICD-10-CM | POA: Diagnosis not present

## 2024-06-08 DIAGNOSIS — E559 Vitamin D deficiency, unspecified: Secondary | ICD-10-CM | POA: Diagnosis not present

## 2024-06-08 DIAGNOSIS — M62551 Muscle wasting and atrophy, not elsewhere classified, right thigh: Secondary | ICD-10-CM | POA: Diagnosis not present

## 2024-06-08 DIAGNOSIS — Z8619 Personal history of other infectious and parasitic diseases: Secondary | ICD-10-CM | POA: Diagnosis not present

## 2024-06-09 DIAGNOSIS — S0502XD Injury of conjunctiva and corneal abrasion without foreign body, left eye, subsequent encounter: Secondary | ICD-10-CM | POA: Diagnosis not present

## 2024-06-09 LAB — HCV RNA QUANT: Hepatitis C Quantitation: NOT DETECTED [IU]/mL

## 2024-06-09 LAB — PHOSPHATIDYLETHANOL (PETH)
Phosphatidylethanol (PEth): 758 ng/mL
Phosphatidylethanol: POSITIVE — AB

## 2024-06-09 LAB — HEPATITIS C ANTIBODY: Hepatitis C Ab: REACTIVE — AB

## 2024-06-09 LAB — HCV RNA,QUANTITATIVE REAL TIME PCR
HCV Quantitative Log: <1.18 {Log_IU}/mL
HCV RNA, PCR, QN: <15 [IU]/mL

## 2024-06-10 DIAGNOSIS — Z1330 Encounter for screening examination for mental health and behavioral disorders, unspecified: Secondary | ICD-10-CM | POA: Diagnosis not present

## 2024-06-10 DIAGNOSIS — M545 Low back pain, unspecified: Secondary | ICD-10-CM | POA: Diagnosis not present

## 2024-06-10 DIAGNOSIS — M47817 Spondylosis without myelopathy or radiculopathy, lumbosacral region: Secondary | ICD-10-CM | POA: Diagnosis not present

## 2024-06-10 DIAGNOSIS — M791 Myalgia, unspecified site: Secondary | ICD-10-CM | POA: Diagnosis not present

## 2024-06-11 NOTE — Telephone Encounter (Signed)
Patient calling in regards to results. Please advise.   Thank you

## 2024-06-11 NOTE — Progress Notes (Signed)
 Agree with the assessment and plan as outlined by Brigitte Canard, PA-C.

## 2024-06-14 ENCOUNTER — Telehealth: Payer: Self-pay | Admitting: Gastroenterology

## 2024-06-14 ENCOUNTER — Telehealth: Payer: Self-pay | Admitting: Cardiology

## 2024-06-14 DIAGNOSIS — S0502XD Injury of conjunctiva and corneal abrasion without foreign body, left eye, subsequent encounter: Secondary | ICD-10-CM | POA: Diagnosis not present

## 2024-06-14 DIAGNOSIS — R002 Palpitations: Secondary | ICD-10-CM

## 2024-06-14 NOTE — Telephone Encounter (Signed)
 Spoke with pt, she reports that dr harding wanted her to send him strips from her phone but she is not able to do that she would like to wear a monitor instead. When she is checking her O2 level her heart rate will be low at 52 and then it will increase. She is concerned that she may have a fib. She has not gone to rehab yet because of her brother and family issues. Aware will forward to dr harding for order for monitor.

## 2024-06-14 NOTE — Telephone Encounter (Signed)
 Patient calling in about getting a heart monitor. Please advise

## 2024-06-14 NOTE — Telephone Encounter (Signed)
 Left message for patient to call back

## 2024-06-14 NOTE — Telephone Encounter (Signed)
 Received call from patient regarding an upcoming surgery, would like a phone call to discuss in depth Crestor  medication, requesting referral to be sent to [phone], and would like lab work mailed to her. Please review and advise.   Thank you

## 2024-06-14 NOTE — Telephone Encounter (Signed)
 Patient states cardiology told her that she needed their clearance before having a procedure completed since she is a cardiac patient. Advised that she is having her procedure at the hospital due to cardiac status and O2 use. She is no longer on pletal  etc so no clearance needed for this. I advised that she is ok to proceed with colonoscopy procedure at the hospital but will send a note to Dr Anner in case he does have issue with her moving forward with our elective procedure.

## 2024-06-15 ENCOUNTER — Ambulatory Visit: Attending: Cardiology

## 2024-06-15 DIAGNOSIS — R002 Palpitations: Secondary | ICD-10-CM

## 2024-06-15 NOTE — Telephone Encounter (Signed)
 OK to order 14 d Zio. Dx Palpitations.  dh

## 2024-06-15 NOTE — Progress Notes (Unsigned)
 Enrolled for [employer] to mail a ZIO XT long term holter monitor to the patients address on file.

## 2024-06-15 NOTE — Telephone Encounter (Signed)
 Spoke with pt, aware monitor will mailed to her home.

## 2024-06-15 NOTE — Telephone Encounter (Signed)
-   pre-op info should go through normal channels.  With recent Coronary CTA results - should be fine for procedures.  NO other evaluation necessary.  Alm Clay, MD

## 2024-06-16 DIAGNOSIS — F1020 Alcohol dependence, uncomplicated: Secondary | ICD-10-CM | POA: Diagnosis not present

## 2024-06-16 DIAGNOSIS — F411 Generalized anxiety disorder: Secondary | ICD-10-CM | POA: Diagnosis not present

## 2024-06-16 DIAGNOSIS — F339 Major depressive disorder, recurrent, unspecified: Secondary | ICD-10-CM | POA: Diagnosis not present

## 2024-06-16 DIAGNOSIS — F410 Panic disorder [episodic paroxysmal anxiety] without agoraphobia: Secondary | ICD-10-CM | POA: Diagnosis not present

## 2024-06-22 DIAGNOSIS — J449 Chronic obstructive pulmonary disease, unspecified: Secondary | ICD-10-CM | POA: Diagnosis not present

## 2024-06-22 DIAGNOSIS — G4736 Sleep related hypoventilation in conditions classified elsewhere: Secondary | ICD-10-CM | POA: Diagnosis not present

## 2024-06-23 DIAGNOSIS — S0502XD Injury of conjunctiva and corneal abrasion without foreign body, left eye, subsequent encounter: Secondary | ICD-10-CM | POA: Diagnosis not present

## 2024-06-30 NOTE — Addendum Note (Signed)
 Addended by: JOSHUA TYLENE CROME on: 06/30/2024 02:46 PM   Modules accepted: Orders

## 2024-07-12 DIAGNOSIS — R002 Palpitations: Secondary | ICD-10-CM | POA: Diagnosis not present

## 2024-07-15 DIAGNOSIS — F410 Panic disorder [episodic paroxysmal anxiety] without agoraphobia: Secondary | ICD-10-CM | POA: Diagnosis not present

## 2024-07-15 DIAGNOSIS — F1020 Alcohol dependence, uncomplicated: Secondary | ICD-10-CM | POA: Diagnosis not present

## 2024-07-15 DIAGNOSIS — F339 Major depressive disorder, recurrent, unspecified: Secondary | ICD-10-CM | POA: Diagnosis not present

## 2024-07-15 DIAGNOSIS — F411 Generalized anxiety disorder: Secondary | ICD-10-CM | POA: Diagnosis not present

## 2024-07-22 DIAGNOSIS — G4736 Sleep related hypoventilation in conditions classified elsewhere: Secondary | ICD-10-CM | POA: Diagnosis not present

## 2024-07-22 DIAGNOSIS — J449 Chronic obstructive pulmonary disease, unspecified: Secondary | ICD-10-CM | POA: Diagnosis not present

## 2024-08-09 ENCOUNTER — Other Ambulatory Visit: Payer: Self-pay | Admitting: Family Medicine

## 2024-08-12 DIAGNOSIS — F411 Generalized anxiety disorder: Secondary | ICD-10-CM | POA: Diagnosis not present

## 2024-08-12 DIAGNOSIS — F410 Panic disorder [episodic paroxysmal anxiety] without agoraphobia: Secondary | ICD-10-CM | POA: Diagnosis not present

## 2024-08-12 DIAGNOSIS — F339 Major depressive disorder, recurrent, unspecified: Secondary | ICD-10-CM | POA: Diagnosis not present

## 2024-08-12 DIAGNOSIS — F1020 Alcohol dependence, uncomplicated: Secondary | ICD-10-CM | POA: Diagnosis not present

## 2024-08-16 DIAGNOSIS — N814 Uterovaginal prolapse, unspecified: Secondary | ICD-10-CM | POA: Diagnosis not present

## 2024-08-16 DIAGNOSIS — Z01419 Encounter for gynecological examination (general) (routine) without abnormal findings: Secondary | ICD-10-CM | POA: Diagnosis not present

## 2024-08-16 DIAGNOSIS — Z113 Encounter for screening for infections with a predominantly sexual mode of transmission: Secondary | ICD-10-CM | POA: Diagnosis not present

## 2024-08-17 ENCOUNTER — Telehealth: Payer: Self-pay | Admitting: Physician Assistant

## 2024-08-17 NOTE — Telephone Encounter (Signed)
 Patient requesting f/u call in regards to her plan of care. Please advise.

## 2024-08-18 ENCOUNTER — Ambulatory Visit: Payer: Self-pay | Admitting: Cardiology

## 2024-08-18 ENCOUNTER — Telehealth: Payer: Self-pay | Admitting: Cardiology

## 2024-08-18 DIAGNOSIS — R002 Palpitations: Secondary | ICD-10-CM

## 2024-08-18 MED ORDER — NITROGLYCERIN 0.4 MG SL SUBL: 0.4000 mg | SUBLINGUAL_TABLET | SUBLINGUAL | 1 refills | Status: AC | PRN

## 2024-08-18 NOTE — Telephone Encounter (Signed)
 Refill for Nitroglycerin  has been sent to [facility].

## 2024-08-18 NOTE — Telephone Encounter (Signed)
 Results reviewed.  Normal study. Will be in results section.

## 2024-08-18 NOTE — Telephone Encounter (Addendum)
 Patient identification verified by 2 forms.  Pt calling because she is not sure if she sent her monitor back correctly.  Report has been posted.  Will get message to Dr. Anner to let him know pt is looking for her results.   Patient agrees with plan, no questions at this time

## 2024-08-18 NOTE — Telephone Encounter (Signed)
 Patient calls stating that she needs to cancel her scheduled endoscopy/colonoscopy at [facility] hospital for 08/30/24. States over the last several weeks, she has had upper respiratory symptoms with lingering cough. Also says she has chronic low sodium and has been taking sodium tablets but these have increased her blood pressure instead-also says if she uses the prep for procedure, it could kill her. Patient then discusses that her bladder is falling out and she has been fitted with multiple donuts (sounds like pessary fittings) but they are still not right and she is experiencing discomfort from this. Goes on to also state that she possibly needs her uterus removed and is just too sick to move forward with the procedure.  When asked if she would like to reschedule procedures to a later date, patient says she knows she needs the procedures but will call us  back later if she wants to reschedule. She also says she wants everyone to know that she has been very sick.

## 2024-08-18 NOTE — Telephone Encounter (Signed)
 Patient calling in about her heart monitors results. She is unsure if she sent it back correctly. Please advise

## 2024-08-18 NOTE — Telephone Encounter (Signed)
*  STAT* If patient is at the pharmacy, call can be transferred to refill team.   1. Which medications need to be refilled? (please list name of each medication and dose if known)b nitroGLYCERIN  (NITROSTAT ) 0.4 MG SL tablet    2. Which pharmacy/location (including street and city if local pharmacy) is medication to be sent to?  [id number] - [place], [place] - [address] AT [address]    3. Do they need a 30 day or 90 day supply? 90

## 2024-08-19 DIAGNOSIS — R0982 Postnasal drip: Secondary | ICD-10-CM | POA: Diagnosis not present

## 2024-08-19 DIAGNOSIS — R5383 Other fatigue: Secondary | ICD-10-CM | POA: Diagnosis not present

## 2024-08-19 DIAGNOSIS — R0989 Other specified symptoms and signs involving the circulatory and respiratory systems: Secondary | ICD-10-CM | POA: Diagnosis not present

## 2024-08-19 NOTE — Telephone Encounter (Signed)
 Called pt to go over the monitor results from Dr. Anner. No answer, left the results on the voicemail.

## 2024-08-22 DIAGNOSIS — G4736 Sleep related hypoventilation in conditions classified elsewhere: Secondary | ICD-10-CM | POA: Diagnosis not present

## 2024-08-22 DIAGNOSIS — J449 Chronic obstructive pulmonary disease, unspecified: Secondary | ICD-10-CM | POA: Diagnosis not present

## 2024-08-25 ENCOUNTER — Emergency Department (HOSPITAL_COMMUNITY)

## 2024-08-25 ENCOUNTER — Encounter (HOSPITAL_COMMUNITY): Payer: Self-pay

## 2024-08-25 ENCOUNTER — Emergency Department (HOSPITAL_COMMUNITY)
Admission: EM | Admit: 2024-08-25 | Discharge: 2024-08-25 | Disposition: A | Discharge status: Home / Self Care | Attending: Emergency Medicine | Admitting: Emergency Medicine

## 2024-08-25 ENCOUNTER — Other Ambulatory Visit: Payer: Self-pay

## 2024-08-25 DIAGNOSIS — R1084 Generalized abdominal pain: Secondary | ICD-10-CM | POA: Diagnosis not present

## 2024-08-25 DIAGNOSIS — F10929 Alcohol use, unspecified with intoxication, unspecified: Secondary | ICD-10-CM | POA: Diagnosis not present

## 2024-08-25 DIAGNOSIS — Z91040 Latex allergy status: Secondary | ICD-10-CM | POA: Insufficient documentation

## 2024-08-25 DIAGNOSIS — J9801 Acute bronchospasm: Secondary | ICD-10-CM | POA: Diagnosis not present

## 2024-08-25 DIAGNOSIS — R059 Cough, unspecified: Secondary | ICD-10-CM | POA: Diagnosis not present

## 2024-08-25 DIAGNOSIS — R079 Chest pain, unspecified: Secondary | ICD-10-CM | POA: Diagnosis not present

## 2024-08-25 DIAGNOSIS — R109 Unspecified abdominal pain: Secondary | ICD-10-CM | POA: Diagnosis not present

## 2024-08-25 DIAGNOSIS — I700 Atherosclerosis of aorta: Secondary | ICD-10-CM | POA: Diagnosis not present

## 2024-08-25 DIAGNOSIS — K2100 Gastro-esophageal reflux disease with esophagitis, without bleeding: Secondary | ICD-10-CM | POA: Insufficient documentation

## 2024-08-25 LAB — URINALYSIS, ROUTINE W REFLEX MICROSCOPIC
Bilirubin Urine: NEGATIVE
Glucose, UA: NEGATIVE mg/dL
Hgb urine dipstick: NEGATIVE
Ketones, ur: NEGATIVE mg/dL
Leukocytes,Ua: NEGATIVE
Nitrite: NEGATIVE
Protein, ur: 100 mg/dL — AB
Specific Gravity, Urine: 1.004 — ABNORMAL LOW (ref 1.005–1.030)
pH: 6 (ref 5.0–8.0)

## 2024-08-25 LAB — COMPREHENSIVE METABOLIC PANEL WITH GFR
ALT: 38 U/L (ref 0–44)
AST: 45 U/L — ABNORMAL HIGH (ref 15–41)
Albumin: 4.8 g/dL (ref 3.5–5.0)
Alkaline Phosphatase: 77 U/L (ref 38–126)
Anion gap: 15 (ref 5–15)
BUN: 6 mg/dL — ABNORMAL LOW (ref 8–23)
CO2: 24 mmol/L (ref 22–32)
Calcium: 10.1 mg/dL (ref 8.9–10.3)
Chloride: 94 mmol/L — ABNORMAL LOW (ref 98–111)
Creatinine, Ser: 0.7 mg/dL (ref 0.44–1.00)
GFR, Estimated: >60 mL/min (ref >60)
Glucose, Bld: 105 mg/dL — ABNORMAL HIGH (ref 70–99)
Potassium: 3.6 mmol/L (ref 3.5–5.1)
Sodium: 133 mmol/L — ABNORMAL LOW (ref 135–145)
Total Bilirubin: 0.3 mg/dL (ref 0.0–1.2)
Total Protein: 7.4 g/dL (ref 6.5–8.1)

## 2024-08-25 LAB — RESP PANEL BY RT-PCR (RSV, FLU A&B, COVID)  RVPGX2
Influenza A by PCR: NEGATIVE
Influenza B by PCR: NEGATIVE
Resp Syncytial Virus by PCR: NEGATIVE
SARS Coronavirus 2 by RT PCR: NEGATIVE

## 2024-08-25 LAB — CBC WITH DIFFERENTIAL/PLATELET
Abs Immature Granulocytes: 0.03 K/uL (ref 0.00–0.07)
Basophils Absolute: 0.1 K/uL (ref 0.0–0.1)
Basophils Relative: 1 %
Eosinophils Absolute: 0.1 K/uL (ref 0.0–0.5)
Eosinophils Relative: 2 %
HCT: 36.6 % (ref 36.0–46.0)
Hemoglobin: 12 g/dL (ref 12.0–15.0)
Immature Granulocytes: 0 %
Lymphocytes Relative: 35 %
Lymphs Abs: 2.5 K/uL (ref 0.7–4.0)
MCH: 31.4 pg (ref 26.0–34.0)
MCHC: 32.8 g/dL (ref 30.0–36.0)
MCV: 95.8 fL (ref 80.0–100.0)
Monocytes Absolute: 0.8 K/uL (ref 0.1–1.0)
Monocytes Relative: 11 %
Neutro Abs: 3.8 K/uL (ref 1.7–7.7)
Neutrophils Relative %: 51 %
Platelets: 160 K/uL (ref 150–400)
RBC: 3.82 MIL/uL — ABNORMAL LOW (ref 3.87–5.11)
RDW: 13.3 % (ref 11.5–15.5)
WBC: 7.3 K/uL (ref 4.0–10.5)
nRBC: 0 % (ref 0.0–0.2)

## 2024-08-25 LAB — LIPASE, BLOOD: Lipase: 37 U/L (ref 11–51)

## 2024-08-25 MED ORDER — PANTOPRAZOLE SODIUM 40 MG IV SOLR
40.0000 mg | Freq: Once | INTRAVENOUS | Status: AC
  Administered 2024-08-25: 40 mg via INTRAVENOUS
  Filled 2024-08-25: qty 10

## 2024-08-25 MED ORDER — IPRATROPIUM-ALBUTEROL 0.5-2.5 (3) MG/3ML IN SOLN
3.0000 mL | Freq: Once | RESPIRATORY_TRACT | Status: AC
  Administered 2024-08-25: 3 mL via RESPIRATORY_TRACT
  Filled 2024-08-25: qty 3

## 2024-08-25 MED ORDER — ALBUTEROL SULFATE (2.5 MG/3ML) 0.083% IN NEBU
2.5000 mg | INHALATION_SOLUTION | Freq: Once | RESPIRATORY_TRACT | Status: AC
  Administered 2024-08-25: 2.5 mg via RESPIRATORY_TRACT
  Filled 2024-08-25: qty 3

## 2024-08-25 NOTE — ED Triage Notes (Signed)
 Pt BIB EMS with reports of chronic abdominal pain x 2 years. Pt is on 3 L [place] baseline per pt. ETOH. Pain goes, left to right, and center.

## 2024-08-25 NOTE — Telephone Encounter (Signed)
 Called a friend- [place]) of patient picked up the her phone.    He states patient went by EMS today -- [facility].  RN informed friend will call back another day .

## 2024-08-25 NOTE — ED Provider Notes (Signed)
 [place] EMERGENCY DEPARTMENT AT [facility] Provider Note   CSN: [id number] Arrival date & time: 08/25/24  1125     Patient presents with: No chief complaint on file.   Sarah Reeves is a 63 y.o. female.  {Add pertinent medical, surgical, social history, OB history to YEP:[id number]} Patient complains of cough congestion and abdominal pain.  Patient has a history of irritable bowel   Abdominal Pain      Prior to Admission medications   Medication Sig Start Date End Date Taking? Authorizing Provider  albuterol  (VENTOLIN  HFA) 108 (90 Base) MCG/ACT inhaler Inhale 2 puffs into the lungs every 4 (four) hours as needed for wheezing or shortness of breath. 04/20/24   Parrett, Madelin RAMAN, NP  budesonide -formoterol  (SYMBICORT ) 80-4.5 MCG/ACT inhaler Inhale 2 puffs into the lungs every 12 (twelve) hours. 04/30/24   Olalere, Jennet LABOR, MD  cetirizine  (ZYRTEC ) 10 MG tablet Take 1 tablet (10 mg total) by mouth daily. 09/09/22   Cari Arlean CHRISTELLA, FNP  cyclobenzaprine  (FLEXERIL ) 10 MG tablet Take 10 mg by mouth 3 (three) times daily as needed for muscle spasms.    [provider]  famotidine  (PEPCID ) 20 MG tablet Take 1 tablet (20 mg total) by mouth 2 (two) times daily. 06/04/24   [place], PA-C  folic acid  (FOLVITE ) 1 MG tablet Take 1 tablet (1 mg total) by mouth daily. 03/12/24   Jadapalle, Sree, MD  levalbuterol  (XOPENEX ) 0.63 MG/3ML nebulizer solution Inhale 3 mLs (0.63 mg total) into the lungs every 8 (eight) hours as needed for wheezing. 03/11/24   Donnelly Mellow, MD  LORazepam  (ATIVAN ) 2 MG tablet Take 2 mg by mouth 3 (three) times daily as needed. 05/17/24   [provider]  methocarbamol (ROBAXIN) 750 MG tablet Take 750 mg by mouth 3 (three) times daily. Patient not taking: Reported on 06/04/2024 12/09/23   [provider]  nitroGLYCERIN  (NITROSTAT ) 0.4 MG SL tablet Place 1 tablet (0.4 mg total) under the tongue every 5 (five) minutes as needed for chest pain.  08/18/24   Anner Alm ORN, MD  olmesartan  (BENICAR ) 40 MG tablet Take 1 tablet (40 mg total) by mouth daily. 02/26/24   Daneen Damien BROCKS, NP  OXYGEN  Inhale 2.5 L into the lungs at bedtime.    [provider]  rosuvastatin  (CRESTOR ) 10 MG tablet Take 1 tablet (10 mg total) by mouth daily. Patient not taking: Reported on 06/04/2024 04/12/24 07/11/24  Anner Alm ORN, MD  thiamine  (VITAMIN B-1) 100 MG tablet Take 1 tablet (100 mg total) by mouth daily. 03/12/24   Jadapalle, Sree, MD  valACYclovir  (VALTREX ) 500 MG tablet Take 500 mg by mouth daily as needed.    [provider]  Vitamin D , Cholecalciferol , 25 MCG (1000 UT) TABS Take 25 mcg by mouth daily. Patient not taking: Reported on 06/04/2024 11/27/22   Jerilynn Lamarr CHRISTELLA, NP    Allergies: Keflex  [cephalexin ], Shellfish allergy , Vancomycin , Zoloft  [sertraline  hcl], Latex, Amitriptyline, Clindamycin/lincomycin, Other, Penicillins, Pentosan polysulfate sodium, Shellfish protein-containing drug products, Sulfa antibiotics, Topamax [topiramate], Uribel [meth-hyo-m bl-na phos-ph sal], Uribel [urelle], Lamotrigine, and Sulfamethoxazole-trimethoprim    Review of Systems  Gastrointestinal:  Positive for abdominal pain.    Updated Vital Signs BP 111/73 (BP Location: Right Arm)   Pulse 91   Temp 98.4 F (36.9 C) (Oral)   Resp 18   SpO2 91%   Physical Exam  (all labs ordered are listed, but only abnormal results are displayed) Labs Reviewed  COMPREHENSIVE METABOLIC PANEL WITH  GFR - Abnormal; Notable for the following components:      Result Value   Sodium 133 (*)    Chloride 94 (*)    Glucose, Bld 105 (*)    BUN 6 (*)    AST 45 (*)    All other components within normal limits  CBC WITH DIFFERENTIAL/PLATELET - Abnormal; Notable for the following components:   RBC 3.82 (*)    All other components within normal limits  URINALYSIS, ROUTINE W REFLEX MICROSCOPIC - Abnormal; Notable for the following components:   Specific Gravity,  Urine 1.004 (*)    Protein, ur 100 (*)    All other components within normal limits  RESP PANEL BY RT-PCR (RSV, FLU A&B, COVID)  RVPGX2  LIPASE, BLOOD    EKG: None  Radiology: DG Abdomen Acute W/Chest Result Date: 08/25/2024 EXAM: UPRIGHT AND SUPINE XRAY VIEWS OF THE ABDOMEN AND 2 VIEW(S) OF THE CHEST, INCLUDING A FRONTAL VIEW 08/25/2024 12:45:56 PM COMPARISON: None available. CLINICAL HISTORY: pain FINDINGS: LUNGS AND PLEURA: No consolidation or pulmonary edema. No pleural effusion or pneumothorax. HEART AND MEDIASTINUM: No acute abnormality of the cardiac and mediastinal silhouettes. BOWEL: The bowel gas pattern is nonspecific. No bowel obstruction. PERITONEUM AND SOFT TISSUES: Aortic Atherosclerosis (ICD10-170.0). Probable pessary ring in the midline pelvis. No free air. BONES: No acute osseous abnormality. IMPRESSION: 1. No acute cardiopulmonary or abdominal abnormality detected. Electronically signed by: Norleen Boxer MD 08/25/2024 01:32 PM EST RP Workstation: [id number]    {Document cardiac monitor, telemetry assessment procedure when appropriate:32947} Procedures   Medications Ordered in the ED  ipratropium-albuterol  (DUONEB) 0.5-2.5 (3) MG/3ML nebulizer solution 3 mL (3 mLs Nebulization Given 08/25/24 1246)  albuterol  (PROVENTIL ) (2.5 MG/3ML) 0.083% nebulizer solution 2.5 mg (2.5 mg Nebulization Given 08/25/24 1246)  pantoprazole  (PROTONIX ) injection 40 mg (40 mg Intravenous Given 08/25/24 1249)      {Click here for ABCD2, HEART and other calculators REFRESH Note before signing:1}                              Medical Decision Making Amount and/or Complexity of Data Reviewed Labs: ordered. Radiology: ordered.  Risk Prescription drug management.  Patient with mild bronchospasm which was helped with neb treatment.  He also has abdominal discomfort that is consistent with her irritable bowel and GERD.  She will be discharged home to continue her present medicines and follow-up with  her PCP  {Document critical care time when appropriate  Document review of labs and clinical decision tools ie CHADS2VASC2, etc  Document your independent review of radiology images and any outside records  Document your discussion with family members, caretakers and with consultants  Document social determinants of health affecting pt's care  Document your decision making why or why not admission, treatments were needed:32947:::1}   Final diagnoses:  Bronchospasm  Gastroesophageal reflux disease with esophagitis without hemorrhage    ED Discharge Orders     None

## 2024-08-25 NOTE — Telephone Encounter (Signed)
-----   Message from Alm Clay sent at 08/18/2024 11:50 PM EDT ----- 14-day Zio patch monitor (August-September 2025)   Predominant sinus rhythm with a rate range of 63 to 148 bpm and average of 97 bpm   Rare isolated PACs and PVCs noted.  No couplets, triplets or bigeminy/trigeminy noted.   No Sustained or nonsustained arrhythmias: Atrial Tachycardia (AT), Supraventricular Tachycardia (SVT), Atrial Fibrillation (A-Fib), Atrial Flutter (A-Flutter), Sustained Ventricular Tachycardia  (VT)   Overall very normal study.  No abnormal findings.   No abnormal rhythms.  No pauses or bradycardia/low heart rates.     Alm Clay, MD ----- Message ----- From: Clay Alm ORN, MD Sent: 08/18/2024  11:47 PM EDT To: Alm ORN Clay, MD

## 2024-08-25 NOTE — Discharge Instructions (Signed)
 Make sure you take your Pepcid  and follow-up with your family doctor next week

## 2024-08-27 ENCOUNTER — Ambulatory Visit: Admitting: Pulmonary Disease

## 2024-08-30 ENCOUNTER — Ambulatory Visit (HOSPITAL_COMMUNITY): Admission: RE | Admit: 2024-08-30 | Source: Home / Self Care | Admitting: Gastroenterology

## 2024-08-30 ENCOUNTER — Encounter (HOSPITAL_COMMUNITY): Admission: RE | Payer: Self-pay | Source: Home / Self Care

## 2024-08-30 SURGERY — EGD (ESOPHAGOGASTRODUODENOSCOPY)
Anesthesia: Monitor Anesthesia Care

## 2024-08-31 ENCOUNTER — Other Ambulatory Visit: Payer: Self-pay

## 2024-08-31 ENCOUNTER — Emergency Department (HOSPITAL_COMMUNITY)
Admission: EM | Admit: 2024-08-31 | Discharge: 2024-08-31 | Disposition: A | Discharge status: Home / Self Care | Attending: Emergency Medicine | Admitting: Emergency Medicine

## 2024-08-31 ENCOUNTER — Encounter (HOSPITAL_COMMUNITY): Payer: Self-pay

## 2024-08-31 ENCOUNTER — Emergency Department (HOSPITAL_COMMUNITY)

## 2024-08-31 DIAGNOSIS — Z91040 Latex allergy status: Secondary | ICD-10-CM | POA: Diagnosis not present

## 2024-08-31 DIAGNOSIS — J449 Chronic obstructive pulmonary disease, unspecified: Secondary | ICD-10-CM | POA: Diagnosis not present

## 2024-08-31 DIAGNOSIS — K760 Fatty (change of) liver, not elsewhere classified: Secondary | ICD-10-CM | POA: Diagnosis not present

## 2024-08-31 DIAGNOSIS — N3001 Acute cystitis with hematuria: Secondary | ICD-10-CM | POA: Diagnosis not present

## 2024-08-31 DIAGNOSIS — R300 Dysuria: Secondary | ICD-10-CM | POA: Diagnosis not present

## 2024-08-31 DIAGNOSIS — Z79899 Other long term (current) drug therapy: Secondary | ICD-10-CM | POA: Insufficient documentation

## 2024-08-31 DIAGNOSIS — R109 Unspecified abdominal pain: Secondary | ICD-10-CM | POA: Diagnosis not present

## 2024-08-31 DIAGNOSIS — D72829 Elevated white blood cell count, unspecified: Secondary | ICD-10-CM | POA: Diagnosis not present

## 2024-08-31 DIAGNOSIS — I10 Essential (primary) hypertension: Secondary | ICD-10-CM | POA: Diagnosis not present

## 2024-08-31 LAB — CBC WITH DIFFERENTIAL/PLATELET
Abs Immature Granulocytes: 0.08 K/uL — ABNORMAL HIGH (ref 0.00–0.07)
Basophils Absolute: 0.1 K/uL (ref 0.0–0.1)
Basophils Relative: 0 %
Eosinophils Absolute: 0 K/uL (ref 0.0–0.5)
Eosinophils Relative: 0 %
HCT: 34.8 % — ABNORMAL LOW (ref 36.0–46.0)
Hemoglobin: 12 g/dL (ref 12.0–15.0)
Immature Granulocytes: 1 %
Lymphocytes Relative: 17 %
Lymphs Abs: 2.6 K/uL (ref 0.7–4.0)
MCH: 32.7 pg (ref 26.0–34.0)
MCHC: 34.5 g/dL (ref 30.0–36.0)
MCV: 94.8 fL (ref 80.0–100.0)
Monocytes Absolute: 1 K/uL (ref 0.1–1.0)
Monocytes Relative: 7 %
Neutro Abs: 11.3 K/uL — ABNORMAL HIGH (ref 1.7–7.7)
Neutrophils Relative %: 75 %
Platelets: 156 K/uL (ref 150–400)
RBC: 3.67 MIL/uL — ABNORMAL LOW (ref 3.87–5.11)
RDW: 13.3 % (ref 11.5–15.5)
WBC: 15.1 K/uL — ABNORMAL HIGH (ref 4.0–10.5)
nRBC: 0 % (ref 0.0–0.2)

## 2024-08-31 LAB — URINALYSIS, ROUTINE W REFLEX MICROSCOPIC
Bilirubin Urine: NEGATIVE
Glucose, UA: NEGATIVE mg/dL
Ketones, ur: NEGATIVE mg/dL
Nitrite: NEGATIVE
Protein, ur: NEGATIVE mg/dL
Specific Gravity, Urine: 1.002 — ABNORMAL LOW (ref 1.005–1.030)
pH: 6 (ref 5.0–8.0)

## 2024-08-31 LAB — COMPREHENSIVE METABOLIC PANEL WITH GFR
ALT: 38 U/L (ref 0–44)
AST: 42 U/L — ABNORMAL HIGH (ref 15–41)
Albumin: 4.3 g/dL (ref 3.5–5.0)
Alkaline Phosphatase: 70 U/L (ref 38–126)
Anion gap: 11 (ref 5–15)
BUN: 8 mg/dL (ref 8–23)
CO2: 24 mmol/L (ref 22–32)
Calcium: 9.5 mg/dL (ref 8.9–10.3)
Chloride: 92 mmol/L — ABNORMAL LOW (ref 98–111)
Creatinine, Ser: 0.64 mg/dL (ref 0.44–1.00)
GFR, Estimated: >60 mL/min (ref >60)
Glucose, Bld: 98 mg/dL (ref 70–99)
Potassium: 4.3 mmol/L (ref 3.5–5.1)
Sodium: 128 mmol/L — ABNORMAL LOW (ref 135–145)
Total Bilirubin: 0.2 mg/dL (ref 0.0–1.2)
Total Protein: 6.7 g/dL (ref 6.5–8.1)

## 2024-08-31 MED ORDER — OXYBUTYNIN CHLORIDE 5 MG PO TABS: 5.0000 mg | ORAL_TABLET | Freq: Three times a day (TID) | ORAL | 0 refills | Status: AC

## 2024-08-31 MED ORDER — CIPROFLOXACIN HCL 500 MG PO TABS: 500.0000 mg | ORAL_TABLET | Freq: Two times a day (BID) | ORAL | 0 refills | Status: AC

## 2024-08-31 MED ORDER — OXYBUTYNIN CHLORIDE 5 MG PO TABS
5.0000 mg | ORAL_TABLET | Freq: Three times a day (TID) | ORAL | Status: DC
  Administered 2024-08-31: 5 mg via ORAL
  Filled 2024-08-31: qty 1

## 2024-08-31 NOTE — ED Triage Notes (Addendum)
 Pt BIBA from home, c/o dysuria since 3am. Also having spasms and only able to get blood out when tried earlier. Also c/o flank pain..  Denies chills. Baseline 2L. Has chronic back pain is worse than usual. A&Ox4. Ambulatory.   BP 148/80 HR 90 RR O2 93 3L CBG 141

## 2024-08-31 NOTE — Discharge Instructions (Addendum)
 You were given a prescription to help treat your infection, please take 1 tablet twice a day for the next 7 days.  You were also given a prescription for oxybutynin, this should help with your bladder spasms, please schedule an appointment with urology for further follow-up.

## 2024-08-31 NOTE — ED Provider Notes (Signed)
 [place] EMERGENCY DEPARTMENT AT [facility] Provider Note   CSN: [id number] Arrival date & time: 08/31/24  0745     Patient presents with: Dysuria   Sarah Reeves is a 63 y.o. female.   63 y.o female with a PMH of hepatitis C, fibromyalgia, COPD on chronic oxygen  2 L, hypertension, substance abuse sent to the ED via EMS with a chief complaint of difficulty urinating this morning.  Patient reports waking up around 6 AM, began to urinate blood only, reports it was very difficult for her to void, she had to stand up in order to urinate.  She reports she was previously told that she may have a neurogenic bladder and needed testing however she did not follow-up with urology for further testing.  Reports the pain was very severe this morning.  She did see her gynecologist approximately a week ago who placed a diaphragm as they reported that her uterus was prolapsing.  She continues to endorse a pelvic pain worsened with any type of palpation.  She did take some lorazepam  that she has previously prescribed without any improvement of her symptoms.  She does have some low back pain, however does have underlying history of chronic back pain issues.  No flank pain reported, no fever, no nausea, no vomiting.    The history is provided by the patient.  Dysuria Pain quality:  Sharp and burning Onset quality:  Sudden Duration:  3 hours Timing:  Constant Chronicity:  New Recent urinary tract infections: no   Relieved by:  Nothing Worsened by:  Nothing Urinary symptoms: hematuria   Associated symptoms: flank pain   Associated symptoms: no fever, no nausea and no vomiting   Risk factors: no kidney transplant, no renal cysts, no renal disease and no urinary catheter        Prior to Admission medications   Medication Sig Start Date End Date Taking? Authorizing Provider  ciprofloxacin (CIPRO) 500 MG tablet Take 1 tablet (500 mg total) by mouth every 12 (twelve) hours for 7 days.  08/31/24 09/07/24 Yes Cap Massi, PA-C  oxybutynin (DITROPAN) 5 MG tablet Take 1 tablet (5 mg total) by mouth 3 (three) times daily for 5 days. 08/31/24 09/05/24 Yes Aanika Defoor, PA-C  albuterol  (VENTOLIN  HFA) 108 (90 Base) MCG/ACT inhaler Inhale 2 puffs into the lungs every 4 (four) hours as needed for wheezing or shortness of breath. 04/20/24   Parrett, Madelin RAMAN, NP  budesonide -formoterol  (SYMBICORT ) 80-4.5 MCG/ACT inhaler Inhale 2 puffs into the lungs every 12 (twelve) hours. 04/30/24   Olalere, Jennet LABOR, MD  cetirizine  (ZYRTEC ) 10 MG tablet Take 1 tablet (10 mg total) by mouth daily. 09/09/22   Cari Arlean HERO, FNP  cyclobenzaprine  (FLEXERIL ) 10 MG tablet Take 10 mg by mouth 3 (three) times daily as needed for muscle spasms.    [provider]  famotidine  (PEPCID ) 20 MG tablet Take 1 tablet (20 mg total) by mouth 2 (two) times daily. 06/04/24   [place], PA-C  folic acid  (FOLVITE ) 1 MG tablet Take 1 tablet (1 mg total) by mouth daily. 03/12/24   Jadapalle, Sree, MD  levalbuterol  (XOPENEX ) 0.63 MG/3ML nebulizer solution Inhale 3 mLs (0.63 mg total) into the lungs every 8 (eight) hours as needed for wheezing. 03/11/24   Donnelly Mellow, MD  LORazepam  (ATIVAN ) 2 MG tablet Take 2 mg by mouth 3 (three) times daily as needed. 05/17/24   [provider]  methocarbamol (ROBAXIN) 750 MG tablet Take 750 mg by mouth  3 (three) times daily. Patient not taking: Reported on 06/04/2024 12/09/23   [provider]  nitroGLYCERIN  (NITROSTAT ) 0.4 MG SL tablet Place 1 tablet (0.4 mg total) under the tongue every 5 (five) minutes as needed for chest pain. 08/18/24   Anner Alm ORN, MD  olmesartan  (BENICAR ) 40 MG tablet Take 1 tablet (40 mg total) by mouth daily. 02/26/24   Daneen Damien BROCKS, NP  OXYGEN  Inhale 2.5 L into the lungs at bedtime.    [provider]  rosuvastatin  (CRESTOR ) 10 MG tablet Take 1 tablet (10 mg total) by mouth daily. Patient not taking: Reported on 06/04/2024 04/12/24  07/11/24  Anner Alm ORN, MD  thiamine  (VITAMIN B-1) 100 MG tablet Take 1 tablet (100 mg total) by mouth daily. 03/12/24   Jadapalle, Sree, MD  valACYclovir  (VALTREX ) 500 MG tablet Take 500 mg by mouth daily as needed.    [provider]  Vitamin D , Cholecalciferol , 25 MCG (1000 UT) TABS Take 25 mcg by mouth daily. Patient not taking: Reported on 06/04/2024 11/27/22   Jerilynn Lamarr HERO, NP    Allergies: Keflex  [cephalexin ], Shellfish allergy , Vancomycin , Zoloft  [sertraline  hcl], Latex, Amitriptyline, Clindamycin/lincomycin, Other, Penicillins, Pentosan polysulfate sodium, Shellfish protein-containing drug products, Sulfa antibiotics, Topamax [topiramate], Uribel [meth-hyo-m bl-na phos-ph sal], Uribel [urelle], Lamotrigine, and Sulfamethoxazole-trimethoprim    Review of Systems  Constitutional:  Negative for fever.  Gastrointestinal:  Negative for nausea and vomiting.  Genitourinary:  Positive for dysuria and flank pain.  All other systems reviewed and are negative.   Updated Vital Signs BP (!) 145/79 (BP Location: Right Arm)   Pulse 91   Temp 98 F (36.7 C) (Oral)   Resp 15   Ht 5' 3.5 (1.613 m)   Wt 64.4 kg   SpO2 100%   BMI 24.76 kg/m   Physical Exam Vitals and nursing note reviewed.  Constitutional:      Appearance: Normal appearance.  HENT:     Head: Normocephalic and atraumatic.     Mouth/Throat:     Mouth: Mucous membranes are moist.  Cardiovascular:     Rate and Rhythm: Normal rate.  Pulmonary:     Effort: Pulmonary effort is normal.  Abdominal:     General: Abdomen is flat.     Palpations: Abdomen is soft.     Tenderness: There is abdominal tenderness in the suprapubic area. There is no right CVA tenderness or left CVA tenderness.  Musculoskeletal:     Cervical back: Normal range of motion and neck supple.  Skin:    General: Skin is warm and dry.  Neurological:     Mental Status: She is alert and oriented to person, place, and time.     (all labs  ordered are listed, but only abnormal results are displayed) Labs Reviewed  URINALYSIS, ROUTINE W REFLEX MICROSCOPIC - Abnormal; Notable for the following components:      Result Value   Color, Urine STRAW (*)    APPearance HAZY (*)    Specific Gravity, Urine 1.002 (*)    Hgb urine dipstick LARGE (*)    Leukocytes,Ua LARGE (*)    Bacteria, UA RARE (*)    Crystals PRESENT (*)    All other components within normal limits  CBC WITH DIFFERENTIAL/PLATELET - Abnormal; Notable for the following components:   WBC 15.1 (*)    RBC 3.67 (*)    HCT 34.8 (*)    Neutro Abs 11.3 (*)    Abs Immature Granulocytes 0.08 (*)    All  other components within normal limits  COMPREHENSIVE METABOLIC PANEL WITH GFR - Abnormal; Notable for the following components:   Sodium 128 (*)    Chloride 92 (*)    AST 42 (*)    All other components within normal limits  URINE CULTURE    EKG: None  Radiology: CT Renal Stone Study Result Date: 08/31/2024 EXAM: CT UROGRAM 08/31/2024 11:12:11 AM TECHNIQUE: CT of the abdomen and pelvis was performed without the administration of intravenous contrast as per CT urogram protocol. Multiplanar reformatted images as well as MIP urogram images are provided for review. Automated exposure control, iterative reconstruction, and/or weight based adjustment of the mA/kV was utilized to reduce the radiation dose to as low as reasonably achievable. COMPARISON: 03/05/2024 CLINICAL HISTORY: Abdominal/flank pain, stone suspected FINDINGS: LOWER CHEST: Minimal bibasilar subsegmental atelectasis is noted. LIVER: Hepatic steatosis. GALLBLADDER AND BILE DUCTS: Gallbladder is unremarkable. No biliary ductal dilatation. SPLEEN: No acute abnormality. PANCREAS: No acute abnormality. ADRENAL GLANDS: No acute abnormality. KIDNEYS, URETERS AND BLADDER: No stones in the kidneys or ureters. No hydronephrosis. No perinephric or periureteral stranding. Urinary bladder is unremarkable. GI AND BOWEL: Stomach  demonstrates no acute abnormality. There is no bowel obstruction. PERITONEUM AND RETROPERITONEUM: No ascites. No free air. VASCULATURE: Aorta is normal in caliber. LYMPH NODES: No lymphadenopathy. REPRODUCTIVE ORGANS: Cerclage device is noted in the pelvis. BONES AND SOFT TISSUES: No acute osseous abnormality. No focal soft tissue abnormality. IMPRESSION: 1. No nephrolithiasis, obstructive uropathy, or urothelial mass. Electronically signed by: Lynwood Seip MD 08/31/2024 11:27 AM EST RP Workstation: [id number]     Procedures   Medications Ordered in the ED  oxybutynin (DITROPAN) tablet 5 mg (has no administration in time range)    Clinical Course as of 08/31/24 1228  Tue Aug 31, 2024  0945 WBC(!): 15.1 [JS]  0945 Hgb urine dipstick(!): LARGE [JS]  0945 Leukocytes,Ua(!): LARGE [JS]  0945 Crystals(!): PRESENT [JS]  0945 WBC, UA: 21-50 [JS]    Clinical Course User Index [JS] Ieasha Boerema, PA-C                                 Medical Decision Making Amount and/or Complexity of Data Reviewed Labs: ordered. Decision-making details documented in ED Course. Radiology: ordered.  Risk Prescription drug management.    This patient presents to the ED for concern of hematuria, this involves a number of treatment options, and is a complaint that carries with it a high risk of complications and morbidity.  The differential diagnosis includes infection, malignancy versus obstruction.    Co morbidities: Discussed in HPI   Brief History:  See HPI.   EMR reviewed including pt PMHx, past surgical history and past visits to ER.   See HPI for more details   Lab Tests:  I ordered and independently interpreted labs.  The pertinent results include:    CBC with leukocytosis of 15.1, hemoglobin is at her baseline.  CMP without any creatinine  Imaging Studies:  CT renal stone study showed: IMPRESSION:  1. No nephrolithiasis, obstructive uropathy, or urothelial mass.   Cardiac  Monitoring:  N/A  Medicines ordered:  I ordered medication including oxybutin  for bladder spasms  Reevaluation of the patient after these medicines showed that the patient stayed the same I have reviewed the patients home medicines and have made adjustments as needed  Reevaluation:  After the interventions noted above I re-evaluated patient and found that they have :stayed the  same  Social Determinants of Health:  The patient's social determinants of health were a factor in the care of this patient  Problem List / ED Course:  Patient presented to the ED with a chief complaint of dysuria which began this morning around 6 AM when she was trying to void, she reports seeing trickling down, reports that she felt that she could not void.  She was able to stand up and urinate on a bucket, she reports previously following with urology and being told that she had some sort of neurogenic bladder but needed testing and she could not afford the testing at that time.  She has had some low back pain.  She reports that this is not flank pain at this time, she is tolerating p.o. without any nausea or vomiting.  Interpretation of her blood work here reveals CBC with a leukocytosis of 15,000, hemoglobin are within normal limits.  CMP with some decrease in sodium, creatinine is within normal limits.  LFTs are unremarkable.  Urinalysis is remarkable for large leukocytes large hemoglobin, 21-50 white blood cell count concerning for infection therefore sent for urine culture. Due to elevated white count along with some flank pain CT renal obtained to rule out any infected stone or pyelonephritis.  CT is within normal limits, given oxybutynin and ordered to help with bladder spasms.  In addition, I do feel that patient warrants treatment at this time for likely pyelonephritis. We discussed placing her on a short course of antibiotics after reviewing her allergy  list, she has not had ciprofloxacin in the past, will  go home with a 7-day course twice daily. She is overall hemodynamically stable for discharge at this time, return precautions discussed at length with her and husband at the bedside.  Dispostion:  After consideration of the diagnostic results and the patients response to treatment, I feel that the patent would benefit from treatment of urinary tract infection.     Portions of this note were generated with Scientist, clinical (histocompatibility and immunogenetics). Dictation errors may occur despite best attempts at proofreading.   Final diagnoses:  Dysuria  Acute cystitis with hematuria    ED Discharge Orders          Ordered    oxybutynin (DITROPAN) 5 MG tablet  3 times daily        08/31/24 1214    ciprofloxacin (CIPRO) 500 MG tablet  Every 12 hours        08/31/24 1214               Earnestine Shipp, PA-C 08/31/24 1228    Laurice Maude BROCKS, MD 08/31/24 1415

## 2024-09-01 LAB — URINE CULTURE: Culture: NO GROWTH

## 2024-09-02 ENCOUNTER — Ambulatory Visit: Admitting: Pulmonary Disease

## 2024-09-14 DIAGNOSIS — F410 Panic disorder [episodic paroxysmal anxiety] without agoraphobia: Secondary | ICD-10-CM | POA: Diagnosis not present

## 2024-09-14 DIAGNOSIS — F411 Generalized anxiety disorder: Secondary | ICD-10-CM | POA: Diagnosis not present

## 2024-09-14 DIAGNOSIS — F1020 Alcohol dependence, uncomplicated: Secondary | ICD-10-CM | POA: Diagnosis not present

## 2024-09-14 DIAGNOSIS — F339 Major depressive disorder, recurrent, unspecified: Secondary | ICD-10-CM | POA: Diagnosis not present

## 2024-09-21 ENCOUNTER — Telehealth: Payer: Self-pay | Admitting: Physician Assistant

## 2024-09-21 ENCOUNTER — Telehealth: Payer: Self-pay | Admitting: Nurse Practitioner

## 2024-09-21 NOTE — Telephone Encounter (Signed)
 Inbound call from patient stating she was looking at her calendar and noticed she had an appointment for 12/4 and wanted to confirm. I advised her that I did not see an appointment on the schedule and I did not see where one had been canceled. She states that on her calendar she had wrote  liver. Patient requested that I send a message to the nurse to discuss If she needs to be seen or not. Please advise.

## 2024-09-21 NOTE — Telephone Encounter (Signed)
 Inbound call from patient stating that she is returning a call back to [place]. Patient is requesting a call back. Please advise.

## 2024-09-21 NOTE — Telephone Encounter (Signed)
 Left message for patient to call back

## 2024-09-22 NOTE — Telephone Encounter (Signed)
*  STAT* If patient is at the pharmacy, call can be transferred to refill team.   1. Which medications need to be refilled? (please list name of each medication and dose if known)   olmesartan  (BENICAR ) 40 MG tablet   2. Would you like to learn more about the convenience, safety, & potential cost savings by using the [facility]?   3. Are you open to using the [facility] (Type [facility]. ).  4. Which pharmacy/location (including street and city if local pharmacy) is medication to be sent to?  [id number] - [place], [place] - [address] AT [address]   5. Do they need a 30 day or 90 day supply?   90 day  Patient stated she is almost out of this medication and her pharmacy is closed on the weekend.  Patient has appointment scheduled with Dr. Court on 12/15.

## 2024-09-22 NOTE — Telephone Encounter (Signed)
 Refill sent

## 2024-09-22 NOTE — Telephone Encounter (Signed)
 Attempted to call patient back. Phone rings for extended period of time with no answer. This time, no voicemail was available.

## 2024-09-23 NOTE — Telephone Encounter (Signed)
 Left message for patient to call back

## 2024-09-24 NOTE — Telephone Encounter (Signed)
 Following 2 additional attempts to reach patient with no return correspondence, we will cease further efforts to speak with patient until we hear back from her.   We have nothing for her in regards to GI care for 12/4 as she indicates she is questioning. Patient is to have endoscopy at the hospital setting but called and cancelled that some time ago indicating she would call back when she was ready to schedule.
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
## 2024-09-27 DIAGNOSIS — F1010 Alcohol abuse, uncomplicated: Secondary | ICD-10-CM | POA: Diagnosis not present

## 2024-09-27 DIAGNOSIS — N814 Uterovaginal prolapse, unspecified: Secondary | ICD-10-CM | POA: Diagnosis not present

## 2024-09-28 NOTE — Telephone Encounter (Signed)
 Inbound call from patient requesting that I send a message to Dottie, she is wanting to inform that she would like to have her endo and colon in March due to her brother going to be out of town in February. Patient is requesting a call back to discuss. Please advise.

## 2024-09-28 NOTE — Telephone Encounter (Signed)
 Noted. No schedule available yet for March but will place a reminder note in EPIC with patient wishes for March appointment.

## 2024-10-04 ENCOUNTER — Ambulatory Visit: Admitting: Cardiovascular Disease

## 2024-10-13 NOTE — Telephone Encounter (Signed)
 Contacted patient to offer hospital procedure date. Patient again discusses that she wants to hold off on scheduling any procedures with GI due to concerns of her uterus and bladder falling out and possibly needing emergency surgery. Patient is advised that she should call us  back when she feels she is ready to move forward with GI testing and we will see her in the office for reevaluation at that time. She verbalizes understanding.

## 2024-11-19 ENCOUNTER — Telehealth: Payer: Self-pay

## 2024-11-19 NOTE — Telephone Encounter (Signed)
 Copied from CRM #[id number]. Topic: Clinical - Order For Equipment >> Nov 18, 2024 11:11 AM Sarah Reeves wrote: Reason for CRM: Patient calling in, upset with [employer].  Patient would not like to deal with them any longer. Would like her order sent to a new suppy company.  What supply company can she move to? Please call patient @ [phone]   Spoke with patient she needs to reach out to her insurance to see what DME company they would prefer    -NFN

## 2025-01-06 ENCOUNTER — Other Ambulatory Visit
